# Patient Record
Sex: Male | Born: 1965
Health system: Southern US, Community
[De-identification: ages and names within clinical notes are randomized; demographics above are authoritative.]

## PROBLEM LIST (undated history)

## (undated) DIAGNOSIS — F03918 Unspecified dementia, unspecified severity, with other behavioral disturbance: Secondary | ICD-10-CM

## (undated) DIAGNOSIS — R569 Unspecified convulsions: Secondary | ICD-10-CM

## (undated) DIAGNOSIS — S3992XA Unspecified injury of lower back, initial encounter: Secondary | ICD-10-CM

## (undated) DIAGNOSIS — F32A Depression, unspecified: Secondary | ICD-10-CM

## (undated) DIAGNOSIS — Z5189 Encounter for other specified aftercare: Secondary | ICD-10-CM

## (undated) DIAGNOSIS — F0391 Unspecified dementia with behavioral disturbance: Secondary | ICD-10-CM

## (undated) DIAGNOSIS — R519 Headache, unspecified: Secondary | ICD-10-CM

## (undated) DIAGNOSIS — D500 Iron deficiency anemia secondary to blood loss (chronic): Secondary | ICD-10-CM

## (undated) DIAGNOSIS — S069XAA Unspecified intracranial injury with loss of consciousness status unknown, initial encounter: Secondary | ICD-10-CM

## (undated) DIAGNOSIS — E785 Hyperlipidemia, unspecified: Secondary | ICD-10-CM

## (undated) DIAGNOSIS — S069X9A Unspecified intracranial injury with loss of consciousness of unspecified duration, initial encounter: Secondary | ICD-10-CM

## (undated) DIAGNOSIS — T783XXA Angioneurotic edema, initial encounter: Secondary | ICD-10-CM

## (undated) DIAGNOSIS — R339 Retention of urine, unspecified: Secondary | ICD-10-CM

## (undated) DIAGNOSIS — K264 Chronic or unspecified duodenal ulcer with hemorrhage: Secondary | ICD-10-CM

## (undated) DIAGNOSIS — K922 Gastrointestinal hemorrhage, unspecified: Secondary | ICD-10-CM

## (undated) HISTORY — DX: Iron deficiency anemia secondary to blood loss (chronic): D50.0

## (undated) HISTORY — DX: Angioneurotic edema, initial encounter: T78.3XXA

## (undated) HISTORY — DX: Hyperlipidemia, unspecified: E78.5

## (undated) HISTORY — DX: Depression, unspecified: F32.A

## (undated) HISTORY — DX: Encounter for other specified aftercare: Z51.89

## (undated) HISTORY — DX: Retention of urine, unspecified: R33.9

## (undated) HISTORY — PX: CRANIOTOMY: SHX93

## (undated) HISTORY — PX: CSF SHUNT: SHX92

## (undated) HISTORY — PX: BRAIN SURGERY: SHX531

---

## 2012-07-23 HISTORY — PX: OTHER SURGICAL HISTORY: SHX169

## 2012-08-23 ENCOUNTER — Encounter (HOSPITAL_COMMUNITY): Payer: Self-pay | Admitting: *Deleted

## 2012-08-23 ENCOUNTER — Emergency Department (HOSPITAL_COMMUNITY)
Admission: EM | Admit: 2012-08-23 | Discharge: 2012-08-23 | Disposition: A | Discharge status: Home / Self Care | Payer: BC Managed Care – PPO | Attending: Emergency Medicine | Admitting: Emergency Medicine

## 2012-08-23 DIAGNOSIS — R451 Restlessness and agitation: Secondary | ICD-10-CM

## 2012-08-23 DIAGNOSIS — Z87820 Personal history of traumatic brain injury: Secondary | ICD-10-CM

## 2012-08-23 DIAGNOSIS — IMO0002 Reserved for concepts with insufficient information to code with codable children: Secondary | ICD-10-CM | POA: Insufficient documentation

## 2012-08-23 DIAGNOSIS — Z87891 Personal history of nicotine dependence: Secondary | ICD-10-CM | POA: Insufficient documentation

## 2012-08-23 HISTORY — DX: Unspecified intracranial injury with loss of consciousness of unspecified duration, initial encounter: S06.9X9A

## 2012-08-23 HISTORY — DX: Unspecified intracranial injury with loss of consciousness status unknown, initial encounter: S06.9XAA

## 2012-08-23 HISTORY — DX: Unspecified injury of lower back, initial encounter: S39.92XA

## 2012-08-23 LAB — URINALYSIS, ROUTINE W REFLEX MICROSCOPIC
Leukocytes, UA: NEGATIVE
Nitrite: NEGATIVE
Specific Gravity, Urine: 1.019 (ref 1.005–1.030)
Urobilinogen, UA: 0.2 mg/dL (ref 0.0–1.0)

## 2012-08-23 LAB — CBC WITH DIFFERENTIAL/PLATELET
Basophils Absolute: 0 10*3/uL (ref 0.0–0.1)
Basophils Relative: 0 % (ref 0–1)
Eosinophils Absolute: 0.3 10*3/uL (ref 0.0–0.7)
Lymphs Abs: 3 10*3/uL (ref 0.7–4.0)
MCH: 31.2 pg (ref 26.0–34.0)
Neutrophils Relative %: 49 % (ref 43–77)
Platelets: 202 10*3/uL (ref 150–400)
RBC: 4.14 MIL/uL — ABNORMAL LOW (ref 4.22–5.81)
RDW: 12.5 % (ref 11.5–15.5)

## 2012-08-23 LAB — ETHANOL: Alcohol, Ethyl (B): <11 mg/dL (ref 0–11)

## 2012-08-23 LAB — BASIC METABOLIC PANEL
GFR calc Af Amer: >90 mL/min (ref >90)
GFR calc non Af Amer: >90 mL/min (ref >90)
Glucose, Bld: 100 mg/dL — ABNORMAL HIGH (ref 70–99)
Potassium: 4.2 mEq/L (ref 3.5–5.1)
Sodium: 138 mEq/L (ref 135–145)

## 2012-08-23 LAB — CARBAMAZEPINE LEVEL, TOTAL: Carbamazepine Lvl: 8.4 ug/mL (ref 4.0–12.0)

## 2012-08-23 MED ORDER — LORAZEPAM 1 MG PO TABS: 1.0000 mg | ORAL_TABLET | Freq: Once | ORAL | Status: DC

## 2012-08-23 NOTE — ED Notes (Signed)
[name] wife and mother state they were able to give him Haldol 5mg  this morning around 730 or 8am in order to get him calm enough to transport to [facility].  Wife states that since November his mood and behavior has followed a pattern: several days "overly cooperative" then several days "very needy" then several days of "verbally aggressive" and "fearful" behavior, including paranoia ("talks about people wanting to kill him," sometimes uses foul language, and has a fear of his location/being trapped).

## 2012-08-23 NOTE — ED Notes (Signed)
Family reports pt has hx of TBI. Just moved to [place] from [place] pt has not been able to be set up with psychiatrist yet. Pt does have PCP. Family reports behavior issues for last 3 days ei agitation. Pt has been redirectable. Family would like help with possible medication changes.

## 2012-08-23 NOTE — ED Provider Notes (Signed)
History     CSN: [id number]  Arrival date & time 08/23/12  1003   First MD Initiated Contact with Patient 08/23/12 1125      Chief Complaint  Patient presents with  . Agitation    (Consider location/radiation/quality/duration/timing/severity/associated sxs/prior treatment) HPI Hx from family. Zeth Buday is a 46 y.o. male who is status post TBI approximately 18 months ago. He and his family just moved to [place] from [place]. Patient is currently on several medications to control behavioral issues resulting from his injury, including Tegretol, Zyprexa, propranolol, and prn Haldol. He was followed by a psychiatrist and neuropsychologist while living in [place]; wife reports that he was hospitalized primarily to work on his medication regimen for about 2 months just prior to moving. They have set up an appointment with a local psychiatrist but are unsure if this individual specializes in TBI patients.   Pt presents today as he has been increasingly agitated over the past 3 days. Family reports that he has had outbursts with cursing and yelling and has been paranoid that family members are going to hurt him. He does not become physically violent. They have a 46 year old at home who becomes understandably frightened with these outbursts. Pt is usually redirectable and is typically compliant with taking his medications although sometimes they have to struggle to get him to take his Haldol if he is becoming agitated. Pt was last given Haldol this morning around 8 am. Pt denies any suicidal or homicidal ideation to me. He is fairly high functioning since his accident; he is ambulatory and can perform most ADLs by himself although does need some assistance with showering, etc.  Past Medical History  Diagnosis Date  . TBI (traumatic brain injury)   . Back injury     Past Surgical History  Procedure Date  . Csf shunt   . Craniotomy     Post TBI craniotomy and plate insertion    History  reviewed. No pertinent family history.  History  Substance Use Topics  . Smoking status: Former Games developer  . Smokeless tobacco: Not on file  . Alcohol Use: No      Review of Systems  Constitutional: Negative for fever and chills.  HENT: Negative for congestion and sore throat.   Respiratory: Negative for cough and shortness of breath.   Cardiovascular: Negative for chest pain and palpitations.  Gastrointestinal: Negative for nausea, vomiting and abdominal pain.  Genitourinary: Negative for decreased urine volume.  Skin: Negative for rash.  Neurological: Negative for weakness.  Psychiatric/Behavioral: Positive for agitation.    Allergies  Review of patient's allergies indicates no known allergies.  Home Medications   Current Outpatient Rx  Name Route Sig Dispense Refill  . CARBAMAZEPINE ER 400 MG PO TB12 Oral Take 400 mg by mouth 2 (two) times daily.    [name] HALOPERIDOL 5 MG PO TABS Oral Take 5 mg by mouth every 8 (eight) hours as needed. For agitation    . ADULT MULTIVITAMIN W/MINERALS CH Oral Take 1 tablet by mouth daily.    [name] OLANZAPINE 5 MG PO TABS Oral Take 5 mg by mouth 2 (two) times daily.    [name] PROPRANOLOL HCL 20 MG PO TABS Oral Take 60 mg by mouth 2 (two) times daily.      BP 100/76  Pulse 78  Temp 98 F (36.7 C) (Oral)  Resp 16  SpO2 98%  Physical Exam  Nursing note and vitals reviewed. Constitutional: He is oriented to person, place,  and time. He appears well-developed and well-nourished. No distress.  HENT:  Head: Normocephalic and atraumatic.  Mouth/Throat: Oropharynx is clear and moist. No oropharyngeal exudate.  Eyes: EOM are normal. Pupils are equal, round, and reactive to light.  Neck: Normal range of motion.  Cardiovascular: Normal rate, regular rhythm and normal heart sounds.   Pulmonary/Chest: Effort normal and breath sounds normal. He exhibits no tenderness.  Abdominal: Soft. Bowel sounds are normal. There is no tenderness. There is no rebound and no  guarding.  Musculoskeletal: Normal range of motion.  Neurological: He is alert and oriented to person, place, and time. No cranial nerve deficit. He exhibits normal muscle tone. Coordination normal.  Skin: Skin is warm and dry. He is not diaphoretic.  Psychiatric:       Pt generally cooperative with exam. Not overtly agitated. Does answer most questions. Occasionally interrupts but is easily redirectable.    ED Course  Procedures (including critical care time)  Labs Reviewed  CBC WITH DIFFERENTIAL - Abnormal; Notable for the following:    RBC 4.14 (*)     Hemoglobin 12.9 (*)     HCT 38.9 (*)     All other components within normal limits  BASIC METABOLIC PANEL - Abnormal; Notable for the following:    Glucose, Bld 100 (*)     All other components within normal limits  URINALYSIS, ROUTINE W REFLEX MICROSCOPIC  ETHANOL  CARBAMAZEPINE LEVEL, TOTAL  URINE RAPID DRUG SCREEN (HOSP PERFORMED)   No results found.   1. History of traumatic brain injury   2. Agitation       MDM  Pt with hx TBI, who is new to the area, presents for increased agitation. No evidence on exam or per labs for obvious organic cause of pt's sx. He is calm and cooperative and easily redirectable on my exam; do not feel that he represents a true threat to himself or others at this time. Offered to perform telepsychiatry consult for possible medication adjustment but family declined. Family was given local resources as well as a referral to a neuropsychiatrist in [place] who specializes in TBI patients. They are aware that they can return to the ED at any time should his agitation worsen or with new symptoms.  Grant Fontana, PA-C 08/24/12 1106

## 2012-08-24 NOTE — ED Provider Notes (Signed)
Medical screening examination/treatment/procedure(s) were performed by non-physician practitioner and as supervising physician I was immediately available for consultation/collaboration.  Nikki Glanzer R. Britania Shreeve, MD 08/24/12 1600 

## 2012-08-28 ENCOUNTER — Encounter: Payer: Self-pay | Admitting: Physical Medicine & Rehabilitation

## 2012-09-07 ENCOUNTER — Encounter
Payer: BC Managed Care – PPO | Attending: Physical Medicine & Rehabilitation | Admitting: Physical Medicine & Rehabilitation

## 2012-09-07 ENCOUNTER — Encounter: Payer: Self-pay | Admitting: Physical Medicine & Rehabilitation

## 2012-09-07 VITALS — BP 107/74 | HR 56 | Resp 12 | Ht 72.0 in | Wt 151.6 lb

## 2012-09-07 DIAGNOSIS — S020XXA Fracture of vault of skull, initial encounter for closed fracture: Secondary | ICD-10-CM

## 2012-09-07 DIAGNOSIS — F068 Other specified mental disorders due to known physiological condition: Secondary | ICD-10-CM

## 2012-09-07 DIAGNOSIS — S069X9S Unspecified intracranial injury with loss of consciousness of unspecified duration, sequela: Secondary | ICD-10-CM

## 2012-09-07 DIAGNOSIS — G3184 Mild cognitive impairment, so stated: Secondary | ICD-10-CM | POA: Insufficient documentation

## 2012-09-07 DIAGNOSIS — X58XXXA Exposure to other specified factors, initial encounter: Secondary | ICD-10-CM | POA: Insufficient documentation

## 2012-09-07 DIAGNOSIS — S069X0S Unspecified intracranial injury without loss of consciousness, sequela: Secondary | ICD-10-CM

## 2012-09-07 DIAGNOSIS — S069XAA Unspecified intracranial injury with loss of consciousness status unknown, initial encounter: Secondary | ICD-10-CM | POA: Insufficient documentation

## 2012-09-07 DIAGNOSIS — F3289 Other specified depressive episodes: Secondary | ICD-10-CM | POA: Insufficient documentation

## 2012-09-07 DIAGNOSIS — R269 Unspecified abnormalities of gait and mobility: Secondary | ICD-10-CM | POA: Insufficient documentation

## 2012-09-07 DIAGNOSIS — F09 Unspecified mental disorder due to known physiological condition: Secondary | ICD-10-CM

## 2012-09-07 DIAGNOSIS — S069X9A Unspecified intracranial injury with loss of consciousness of unspecified duration, initial encounter: Secondary | ICD-10-CM

## 2012-09-07 DIAGNOSIS — F329 Major depressive disorder, single episode, unspecified: Secondary | ICD-10-CM | POA: Insufficient documentation

## 2012-09-07 DIAGNOSIS — S069XAS Unspecified intracranial injury with loss of consciousness status unknown, sequela: Secondary | ICD-10-CM | POA: Insufficient documentation

## 2012-09-07 DIAGNOSIS — S0280XA Fracture of other specified skull and facial bones, unspecified side, initial encounter for closed fracture: Secondary | ICD-10-CM | POA: Insufficient documentation

## 2012-09-07 DIAGNOSIS — F6381 Intermittent explosive disorder: Secondary | ICD-10-CM

## 2012-09-07 LAB — CBC
MCH: 31.7 pg (ref 26.0–34.0)
MCHC: 34.7 g/dL (ref 30.0–36.0)
MCV: 91.4 fL (ref 78.0–100.0)
Platelets: 250 10*3/uL (ref 150–400)

## 2012-09-07 NOTE — Patient Instructions (Addendum)
TRY TO DECREASE ZYPREXA TO 2.5MG  NIGHTLY FOR NOW.  ONCE ZYPREXA IS DECREASED. TRY TO ADD THE SECOND DOSE OF AMANTADINE IN AT 12-1PM DAILY

## 2012-09-07 NOTE — Progress Notes (Signed)
Subjective:    Patient ID: Zachary Thornton, male    DOB: 06/10/1966, 46 y.o.   MRN: [id number]  HPI  [name] is here for an initial visit via [name] regarding a severe TBI suffered in March of 2012. Apparently he fell backward down a flight of stairs nad suffered a Right SDH, Right EDH, bifrontal contusions, left temporal SDH, L-para falciform SDH, bilateral skull fx' including the bilateral temporal, skull base, etc. He required a craniectomy on 03/20/11 for decompression and ultimately requried  multiple flap replacements due to infection, etc. He required a IVP shunt as well as a PEG. He was in the hospital for a prolonged stay. He transitioned to a subacute setting and ultimately to an inpatient rehab program over the course of months. He has been involved with home health therapies as well as outpt therapies most recently. His wife states he hasn't been a big fan of going to outpt rehab because he had to transport there in a bus/van.   They moved to [place] about a month ago so that his mother could help with his care. The transition back has not been too bad thus far. Behaviorally he has been a bit uneven, but this has levelled out over the last 2 weeks or so. Rhylen has had problems with agitation and aggressive behavior but this seems to be levelling out also. He was switched to acombination of drugs which included zyprexa, tegretol, and inderal a few months back. His wife has been trying to wean his zyprexa as she thinks it sedates him. He also is amantadine for arousal and attention. His wife thinks he has been depressed at times, but it hasn't been consistent. She feels that some of this depressed mood may be related to relative isolation during the day time while she's at work.  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 He has a 34 year old son.  From a mobility standpoint, [name] has been doing well. He walks without a device. His balance has been fair to good. He is sleeping about 13 hours per night.  Interestingly, his wife states that he is at his peak cognitive,attentional form in the late afternoon, and tends to start out quite slowly each day. He is able to dress himself and perform simple hygiene tasks. He does require ongoing supervision due to safety. Perseveration is problem frequently, buty usually family is able to talk him out of it. For the most part his agitation has decreased quite a bit. Short and long term memory remain major problems. His appetite has be excellent---in fact his wife states he's always hungry. Jourdan denies pain     Pain Inventory Average Pain 0 Pain Right Now 0 My pain is no pain  In the last 24 hours, has pain interfered with the following? General activity 0 Relation with others 0 Enjoyment of life 0 What TIME of day is your pain at its worst? no pain Sleep (in general) Good  Pain is worse with: no pain Pain improves with: no pain Relief from Meds: no pain  Mobility walk with assistance how many minutes can you walk? 10 ability to climb steps?  yes do you drive?  no  Function disabled: date disabled 08/16/2011 I need assistance with the following:  feeding, dressing, bathing, meal prep, household duties and shopping  Neuro/Psych weakness confusion depression  Prior Studies Any changes since last visit?  no  Physicians involved in your care Any changes since last visit?  no Primary care [employer]  History reviewed. No pertinent family history. History   Social History  . Marital Status: Married    Spouse Name: N/A    Number of Children: N/A  . Years of Education: N/A   Social History Main Topics  . Smoking status: Former Games developer  . Smokeless tobacco: Never Used  . Alcohol Use: No  . Drug Use: No  . Sexually Active: None   Other Topics Concern  . None   Social History Narrative  . None   Past Surgical History  Procedure Date  . Csf shunt   . Craniotomy     Post TBI craniotomy and plate insertion   Past  Medical History  Diagnosis Date  . TBI (traumatic brain injury)   . Back injury    BP 107/74  Pulse 56  Resp 12  Ht 6' (1.829 m)  Wt 151 lb 9.6 oz (68.765 kg)  BMI 20.56 kg/m2  SpO2 97%   Review of Systems  Musculoskeletal: Positive for gait problem.  Neurological: Positive for weakness.  Psychiatric/Behavioral: Positive for confusion. The patient is nervous/anxious.   All other systems reviewed and are negative.       Objective:   Physical Exam  General: Alert and oriented x 3, No apparent distress HEENT: Head is normocephalic, atraumatic, PERRLA, EOMI, sclera anicteric, oral mucosa pink and moist, dentition intact, ext ear canals clear,  Neck: Supple without JVD or lymphadenopathy Heart: Reg rate and rhythm. No murmurs rubs or gallops Chest: CTA bilaterally without wheezes, rales, or rhonchi; no distress Abdomen: Soft, non-tender, non-distended, bowel sounds positive. Extremities: No clubbing, cyanosis, or edema. Pulses are 2+ Skin: Clean and intact without signs of breakdown Neuro: Pt knows his name. Needs cues for month, year, city, reason, place. He is able to spell the word "world" forward but misses the last 2 letters when going backward. He could sequence simple numbers in series of 2, but nothing any higher. He remembered 0/3 words after 5 minutes. His abstract thinking was somewhat concrete, but i had the feeling he was trying to relay me the abstract meaning of question. His attention was fair but would wane when distracted. He did perseverate at times on certain phrases or topics but was not agitated. Visual fields were notable for mild peripheral loss around the right edges of his visual field, moreso at the inferior aspect. His voice was nasal but intelligbile. The remainder of CN was nremarkable. Strength was 5/5 on all 4 but he had diminished FMC of the right side with some apraxia noted. Sensation appeared to be grossly intact. He had repetitive, restless moves of  the head and shoulders at times. He ambulated with good balance, however he used a straight-legged pattern and his heel strike was impaired.  He did display circumduction of the leg and a flat foot strike for me.  Musculoskeletal: Full ROM, No pain with AROM or PROM in the neck, trunk, or extremities. Posture appropriate Psych: [name] affect is flat but generally quite pleasant. Pt is cooperative         Assessment & Plan:  Assessment: 1. Severe TBI with depressed skull fxs. Injuries most prominent in the fronto-temporal areas bilaterally. 2. Cognitive-behavioral deficits due to the above 3. Gait disorder due the above 4. Mild depression   Plan: 1. I spent over an hour with the patient and his family reviewing potential treatment options. Obviously, they have undergone many to this point.  2. I think at this time I will make referrals for PT,  OT, SLP, and Neuropsych to reassess his cognitive bheavior needs, higher level balance, safety, etc. 3. We drew multiple tests today including, CBC CMET, tegretol level, hormone levels, etc to see if any changes need to be made to his medication regimen to maximize his neuro-cognitive outcome. 4. Will wean zyprexa to 2.5 nightly with the goal of coming off this entirely. The fact that he is more awake at the end of the day as ooposed to beginning, is a sign that we might be seeing a medication effect. 5. Increase amantadine to 100mg  bid to improve daytime attention.  6. Discussed over the counter supplements to maximize cognitive function including aricept, exelon, namenda which we could explore over the coming months.  7. All questions were encouraged and answered. His wife will call me as needed.

## 2012-09-08 LAB — COMPREHENSIVE METABOLIC PANEL
ALT: 14 U/L (ref 0–53)
Alkaline Phosphatase: 99 U/L (ref 39–117)
Creat: 1.01 mg/dL (ref 0.50–1.35)
Sodium: 145 mEq/L (ref 135–145)
Total Bilirubin: 0.4 mg/dL (ref 0.3–1.2)
Total Protein: 7.4 g/dL (ref 6.0–8.3)

## 2012-09-08 LAB — TSH: TSH: 1.629 u[IU]/mL (ref 0.350–4.500)

## 2012-09-09 ENCOUNTER — Telehealth: Payer: Self-pay | Admitting: Physical Medicine & Rehabilitation

## 2012-09-09 NOTE — Telephone Encounter (Signed)
Let family know that all of his labs look ok. thanks

## 2012-09-10 NOTE — Telephone Encounter (Signed)
Left voicemail on personally identified voicemail notifying that Zachary Thornton's labs were normal and to call if any questions.

## 2012-09-16 ENCOUNTER — Other Ambulatory Visit: Payer: Self-pay | Admitting: Neurology

## 2012-09-16 DIAGNOSIS — Z87820 Personal history of traumatic brain injury: Secondary | ICD-10-CM

## 2012-09-16 DIAGNOSIS — R269 Unspecified abnormalities of gait and mobility: Secondary | ICD-10-CM

## 2012-09-16 DIAGNOSIS — R413 Other amnesia: Secondary | ICD-10-CM

## 2012-09-17 ENCOUNTER — Encounter: Payer: Self-pay | Admitting: Physical Medicine & Rehabilitation

## 2012-09-22 ENCOUNTER — Other Ambulatory Visit: Payer: BC Managed Care – PPO

## 2012-09-24 ENCOUNTER — Ambulatory Visit: Payer: BC Managed Care – PPO | Attending: Physical Medicine & Rehabilitation | Admitting: Physical Therapy

## 2012-09-24 ENCOUNTER — Ambulatory Visit
Admission: RE | Admit: 2012-09-24 | Discharge: 2012-09-24 | Disposition: A | Discharge status: Home / Self Care | Payer: BC Managed Care – PPO | Source: Ambulatory Visit | Attending: Neurology | Admitting: Neurology

## 2012-09-24 ENCOUNTER — Other Ambulatory Visit: Payer: BC Managed Care – PPO

## 2012-09-24 DIAGNOSIS — R269 Unspecified abnormalities of gait and mobility: Secondary | ICD-10-CM

## 2012-09-24 DIAGNOSIS — R279 Unspecified lack of coordination: Secondary | ICD-10-CM | POA: Insufficient documentation

## 2012-09-24 DIAGNOSIS — M6281 Muscle weakness (generalized): Secondary | ICD-10-CM | POA: Insufficient documentation

## 2012-09-24 DIAGNOSIS — R4189 Other symptoms and signs involving cognitive functions and awareness: Secondary | ICD-10-CM | POA: Insufficient documentation

## 2012-09-24 DIAGNOSIS — Z5189 Encounter for other specified aftercare: Secondary | ICD-10-CM | POA: Insufficient documentation

## 2012-09-24 DIAGNOSIS — R41840 Attention and concentration deficit: Secondary | ICD-10-CM | POA: Insufficient documentation

## 2012-09-24 DIAGNOSIS — R41841 Cognitive communication deficit: Secondary | ICD-10-CM | POA: Insufficient documentation

## 2012-09-24 DIAGNOSIS — Z87820 Personal history of traumatic brain injury: Secondary | ICD-10-CM

## 2012-09-24 DIAGNOSIS — R413 Other amnesia: Secondary | ICD-10-CM

## 2012-09-30 ENCOUNTER — Ambulatory Visit: Payer: BC Managed Care – PPO | Admitting: Speech Pathology

## 2012-10-01 ENCOUNTER — Ambulatory Visit: Payer: BC Managed Care – PPO | Admitting: Occupational Therapy

## 2012-10-01 ENCOUNTER — Ambulatory Visit: Payer: BC Managed Care – PPO | Admitting: Physical Therapy

## 2012-10-05 ENCOUNTER — Ambulatory Visit: Payer: BC Managed Care – PPO | Admitting: Physical Therapy

## 2012-10-06 ENCOUNTER — Ambulatory Visit: Payer: BC Managed Care – PPO | Admitting: Occupational Therapy

## 2012-10-07 ENCOUNTER — Encounter: Payer: Self-pay | Admitting: Physical Medicine & Rehabilitation

## 2012-10-07 ENCOUNTER — Encounter
Payer: BC Managed Care – PPO | Attending: Physical Medicine & Rehabilitation | Admitting: Physical Medicine & Rehabilitation

## 2012-10-07 VITALS — BP 111/84 | HR 80 | Ht 72.0 in | Wt 151.2 lb

## 2012-10-07 DIAGNOSIS — F068 Other specified mental disorders due to known physiological condition: Secondary | ICD-10-CM

## 2012-10-07 DIAGNOSIS — S069X9S Unspecified intracranial injury with loss of consciousness of unspecified duration, sequela: Secondary | ICD-10-CM

## 2012-10-07 DIAGNOSIS — S020XXA Fracture of vault of skull, initial encounter for closed fracture: Secondary | ICD-10-CM

## 2012-10-07 DIAGNOSIS — S069XAA Unspecified intracranial injury with loss of consciousness status unknown, initial encounter: Secondary | ICD-10-CM

## 2012-10-07 DIAGNOSIS — R4189 Other symptoms and signs involving cognitive functions and awareness: Secondary | ICD-10-CM

## 2012-10-07 DIAGNOSIS — S069XAS Unspecified intracranial injury with loss of consciousness status unknown, sequela: Secondary | ICD-10-CM

## 2012-10-07 DIAGNOSIS — X58XXXA Exposure to other specified factors, initial encounter: Secondary | ICD-10-CM | POA: Insufficient documentation

## 2012-10-07 DIAGNOSIS — F6381 Intermittent explosive disorder: Secondary | ICD-10-CM

## 2012-10-07 DIAGNOSIS — F09 Unspecified mental disorder due to known physiological condition: Secondary | ICD-10-CM | POA: Insufficient documentation

## 2012-10-07 MED ORDER — CARBAMAZEPINE ER 400 MG PO TB12: 400.0000 mg | ORAL_TABLET | Freq: Two times a day (BID) | ORAL | Status: DC

## 2012-10-07 NOTE — Progress Notes (Signed)
Subjective:    Patient ID: Zachary Thornton, male    DOB: June 18, 1966, 46 y.o.   MRN: [id number]  HPI  Zachary Thornton is back regarding his severe TBI. At last visit I made referrals to outpt therapies where evaluations are underway. He had a headache the other day when he woke up. He had been participating in therapies the prior day.  He denies any dizziness or nausea.  At home his behavior has been good. Wife weaned his zyprexa down to 2.5mg  and increased the amantadine to 100mg  bid. She noticed that the extra dose really made him more anxious and sometimes agitated so she backed down to the QD dose which worked. She has been afraid to further decrease the zyprexa.  Emotionally, Zachary Thornton has been quite attached to his wife and mother, but for the most part his mood has been upbeat.   Pain Inventory Average Pain 0 Pain Right Now 0 My pain is no pain  In the last 24 hours, has pain interfered with the following? General activity 0 Relation with others 0 Enjoyment of life 0 What TIME of day is your pain at its worst? no pain Sleep (in general) Good  Pain is worse with: no pain Pain improves with: no pain Relief from Meds: no pain  Mobility walk without assistance ability to climb steps?  yes do you drive?  no  Function disabled: date disabled 2012 I need assistance with the following:  dressing, bathing, toileting, meal prep, household duties and shopping  Neuro/Psych trouble walking confusion depression anxiety  Prior Studies Any changes since last visit?  no  Physicians involved in your care Any changes since last visit?  no   History reviewed. No pertinent family history. History   Social History  . Marital Status: Married    Spouse Name: N/A    Number of Children: N/A  . Years of Education: N/A   Social History Main Topics  . Smoking status: Former Games developer  . Smokeless tobacco: Never Used  . Alcohol Use: No  . Drug Use: No  . Sexually Active: None   Other Topics  Concern  . None   Social History Narrative  . None   Past Surgical History  Procedure Date  . Csf shunt   . Craniotomy     Post TBI craniotomy and plate insertion   Past Medical History  Diagnosis Date  . TBI (traumatic brain injury)   . Back injury    BP 111/84  Pulse 80  Ht 6' (1.829 m)  Wt 151 lb 3.2 oz (68.584 kg)  BMI 20.51 kg/m2  SpO2 98%   Review of Systems  Musculoskeletal: Positive for gait problem.  Psychiatric/Behavioral: Positive for confusion and dysphoric mood. The patient is nervous/anxious.   All other systems reviewed and are negative.       Objective:   Physical Exam  General: Alert and oriented x 3, No apparent distress  HEENT: Head is normocephalic, atraumatic, PERRLA, EOMI, sclera anicteric, oral mucosa pink and moist, dentition intact, ext ear canals clear,  Neck: Supple without JVD or lymphadenopathy  Heart: Reg rate and rhythm. No murmurs rubs or gallops  Chest: CTA bilaterally without wheezes, rales, or rhonchi; no distress  Abdomen: Soft, non-tender, non-distended, bowel sounds positive.  Extremities: No clubbing, cyanosis, or edema. Pulses are 2+  Skin: Clean and intact without signs of breakdown  Neuro: Pt knows his name. Needs cues for month, year, city, reason, place.   He remembered 0/3 words after 5 minutes.  His abstract thinking was somewhat concrete, but i had the feeling he was trying to relay me the abstract meaning of question. His attention was fair but would wane when distracted. He persistently replies in an automatic way with "i'm sorry" no matter how inappropriate it may be. Visual fields were notable for mild peripheral loss around the right edges of his visual field, moreso at the inferior aspect. His voice was nasal but intelligbile. The remainder of CN was nremarkable. Strength was 5/5 on all 4 but he had diminished FMC of the right side with some apraxia noted. Sensation appeared to be grossly intact.  he ambulated with good  balance, however he used a straight-legged pattern and his heel strike was impaired. He did display circumduction of the leg and a flat foot strike for me.  Musculoskeletal: Full ROM, No pain with AROM or PROM in the neck, trunk, or extremities. Posture appropriate  Psych: [name] affect is flat but generally quite pleasant. Pt is cooperative   Assessment & Plan:   Assessment:  1. Severe TBI with depressed skull fxs. Injuries most prominent in the fronto-temporal areas bilaterally.  2. Cognitive-behavioral deficits due to the above  3. Gait disorder due the above  4. Mild depression   Plan:  1. I spent 30 minutes plus with the patient and his family today.  .  2. Continue with outpt therapies as outlined. i will speak with the disciplines as appropriate.  3.refilled tegretol today at prior dosing.  4. Will wean zyprexa to 2.5 nightly with the goal of coming off this entirely. Will substitute trazodone 100mg  qhs for sleep as a transition. Will wean to of over the next few weeks as tolerated. Can use trazodone as needed for agitation during the day as well 5. Maintain amantadine at 100mg  daily as the bid schedule increased his agitation. 6. Discussed over the counter supplements to maximize cognitive function. Also will consider aricept, exelon, namenda in the future as well. 7. Maintain propanolol at current dosing for the time being until his zyprexa is successfully weaned off and he's at a steady state  7. All questions were encouraged and answered. His wife will call me as needed.

## 2012-10-07 NOTE — Patient Instructions (Signed)
WEEK 1: STOP ZYPREXA TRAZODONE 100MG  AT BEDTIME  WEEK 2: TRAZODONE 50MG  AT BEDTIME  WEEK 3 PLUS: TRAZODONE AS NEEDED AT BEDTIME   YOU MAY USE THE TRAZODONE 50-100MG  EVERY 6 HOURS AS NEEDED FOR AGITATION

## 2012-10-08 ENCOUNTER — Ambulatory Visit: Payer: BC Managed Care – PPO | Admitting: Physical Therapy

## 2012-10-08 ENCOUNTER — Ambulatory Visit: Payer: BC Managed Care – PPO | Admitting: Occupational Therapy

## 2012-10-08 DIAGNOSIS — F0280 Dementia in other diseases classified elsewhere without behavioral disturbance: Secondary | ICD-10-CM

## 2012-10-08 DIAGNOSIS — S069X9A Unspecified intracranial injury with loss of consciousness of unspecified duration, initial encounter: Secondary | ICD-10-CM

## 2012-10-08 DIAGNOSIS — X58XXXA Exposure to other specified factors, initial encounter: Secondary | ICD-10-CM

## 2012-10-08 DIAGNOSIS — S069XAA Unspecified intracranial injury with loss of consciousness status unknown, initial encounter: Secondary | ICD-10-CM

## 2012-10-13 ENCOUNTER — Ambulatory Visit: Payer: BC Managed Care – PPO | Admitting: Physical Therapy

## 2012-10-13 ENCOUNTER — Ambulatory Visit: Payer: BC Managed Care – PPO | Admitting: Occupational Therapy

## 2012-10-14 ENCOUNTER — Ambulatory Visit: Payer: BC Managed Care – PPO | Admitting: Physical Therapy

## 2012-10-14 ENCOUNTER — Ambulatory Visit: Payer: BC Managed Care – PPO | Admitting: Occupational Therapy

## 2012-10-21 ENCOUNTER — Ambulatory Visit: Payer: BC Managed Care – PPO | Admitting: Occupational Therapy

## 2012-10-21 ENCOUNTER — Ambulatory Visit: Payer: BC Managed Care – PPO | Admitting: Rehabilitative and Restorative Service Providers"

## 2012-10-21 ENCOUNTER — Ambulatory Visit: Payer: BC Managed Care – PPO

## 2012-10-23 ENCOUNTER — Ambulatory Visit: Payer: BC Managed Care – PPO

## 2012-10-23 ENCOUNTER — Ambulatory Visit: Payer: BC Managed Care – PPO | Admitting: Occupational Therapy

## 2012-10-23 ENCOUNTER — Ambulatory Visit: Payer: BC Managed Care – PPO | Attending: Physical Medicine & Rehabilitation | Admitting: Physical Therapy

## 2012-10-23 DIAGNOSIS — R4189 Other symptoms and signs involving cognitive functions and awareness: Secondary | ICD-10-CM | POA: Insufficient documentation

## 2012-10-23 DIAGNOSIS — Z5189 Encounter for other specified aftercare: Secondary | ICD-10-CM | POA: Insufficient documentation

## 2012-10-23 DIAGNOSIS — M6281 Muscle weakness (generalized): Secondary | ICD-10-CM | POA: Insufficient documentation

## 2012-10-23 DIAGNOSIS — R41840 Attention and concentration deficit: Secondary | ICD-10-CM | POA: Insufficient documentation

## 2012-10-23 DIAGNOSIS — R279 Unspecified lack of coordination: Secondary | ICD-10-CM | POA: Insufficient documentation

## 2012-10-23 DIAGNOSIS — R269 Unspecified abnormalities of gait and mobility: Secondary | ICD-10-CM | POA: Insufficient documentation

## 2012-10-23 DIAGNOSIS — R41841 Cognitive communication deficit: Secondary | ICD-10-CM | POA: Insufficient documentation

## 2012-10-28 ENCOUNTER — Ambulatory Visit: Payer: BC Managed Care – PPO | Admitting: Occupational Therapy

## 2012-10-28 ENCOUNTER — Ambulatory Visit: Payer: BC Managed Care – PPO | Admitting: Physical Therapy

## 2012-10-28 ENCOUNTER — Ambulatory Visit: Payer: BC Managed Care – PPO | Admitting: Speech Pathology

## 2012-10-30 ENCOUNTER — Ambulatory Visit: Payer: BC Managed Care – PPO

## 2012-10-30 ENCOUNTER — Ambulatory Visit: Payer: BC Managed Care – PPO | Admitting: Physical Therapy

## 2012-10-30 ENCOUNTER — Ambulatory Visit: Payer: BC Managed Care – PPO | Admitting: Occupational Therapy

## 2012-11-02 ENCOUNTER — Ambulatory Visit: Payer: BC Managed Care – PPO | Admitting: Physical Therapy

## 2012-11-02 ENCOUNTER — Ambulatory Visit: Payer: BC Managed Care – PPO | Admitting: Occupational Therapy

## 2012-11-02 ENCOUNTER — Ambulatory Visit: Payer: BC Managed Care – PPO | Admitting: Speech Pathology

## 2012-11-05 ENCOUNTER — Ambulatory Visit: Payer: BC Managed Care – PPO | Admitting: Occupational Therapy

## 2012-11-05 ENCOUNTER — Ambulatory Visit: Payer: BC Managed Care – PPO | Admitting: Physical Therapy

## 2012-11-05 ENCOUNTER — Ambulatory Visit: Payer: BC Managed Care – PPO

## 2012-11-05 ENCOUNTER — Telehealth: Payer: Self-pay

## 2012-11-05 NOTE — Telephone Encounter (Signed)
Christine at [facility] called to get permission for patients tegretol brand to be switch.  Lm advising this was ok.

## 2012-11-09 ENCOUNTER — Ambulatory Visit: Payer: BC Managed Care – PPO | Admitting: Physical Therapy

## 2012-11-09 ENCOUNTER — Ambulatory Visit: Payer: BC Managed Care – PPO | Admitting: Occupational Therapy

## 2012-11-09 ENCOUNTER — Ambulatory Visit: Payer: BC Managed Care – PPO | Admitting: Speech Pathology

## 2012-11-11 ENCOUNTER — Ambulatory Visit: Payer: BC Managed Care – PPO | Admitting: Physical Therapy

## 2012-11-12 ENCOUNTER — Ambulatory Visit: Payer: BC Managed Care – PPO | Admitting: Occupational Therapy

## 2012-11-12 ENCOUNTER — Ambulatory Visit: Payer: BC Managed Care – PPO

## 2012-11-12 ENCOUNTER — Ambulatory Visit: Payer: BC Managed Care – PPO | Admitting: Physical Therapy

## 2012-11-16 ENCOUNTER — Ambulatory Visit: Payer: BC Managed Care – PPO | Admitting: Occupational Therapy

## 2012-11-17 ENCOUNTER — Encounter: Payer: Self-pay | Admitting: Physical Medicine & Rehabilitation

## 2012-11-17 ENCOUNTER — Ambulatory Visit: Payer: BC Managed Care – PPO | Admitting: Physical Therapy

## 2012-11-17 ENCOUNTER — Encounter
Payer: BC Managed Care – PPO | Attending: Physical Medicine & Rehabilitation | Admitting: Physical Medicine & Rehabilitation

## 2012-11-17 VITALS — BP 117/71 | HR 76 | Resp 14 | Ht 71.0 in | Wt 151.0 lb

## 2012-11-17 DIAGNOSIS — F068 Other specified mental disorders due to known physiological condition: Secondary | ICD-10-CM

## 2012-11-17 DIAGNOSIS — S020XXA Fracture of vault of skull, initial encounter for closed fracture: Secondary | ICD-10-CM | POA: Insufficient documentation

## 2012-11-17 DIAGNOSIS — S069X9S Unspecified intracranial injury with loss of consciousness of unspecified duration, sequela: Secondary | ICD-10-CM

## 2012-11-17 DIAGNOSIS — F09 Unspecified mental disorder due to known physiological condition: Secondary | ICD-10-CM | POA: Insufficient documentation

## 2012-11-17 DIAGNOSIS — S069XAA Unspecified intracranial injury with loss of consciousness status unknown, initial encounter: Secondary | ICD-10-CM

## 2012-11-17 DIAGNOSIS — F6381 Intermittent explosive disorder: Secondary | ICD-10-CM

## 2012-11-17 DIAGNOSIS — S069XAS Unspecified intracranial injury with loss of consciousness status unknown, sequela: Secondary | ICD-10-CM

## 2012-11-17 DIAGNOSIS — X58XXXA Exposure to other specified factors, initial encounter: Secondary | ICD-10-CM | POA: Insufficient documentation

## 2012-11-17 MED ORDER — DONEPEZIL HCL 5 MG PO TABS: 5.0000 mg | ORAL_TABLET | Freq: Every evening | ORAL | Status: DC | PRN

## 2012-11-17 NOTE — Patient Instructions (Signed)
ARICEPT: BEGIN AT 5MG  NIGHTLY  TEGRETOL: DECREASE TO 200MG  IN THE AM

## 2012-11-17 NOTE — Progress Notes (Signed)
Subjective:    Patient ID: Zachary Thornton, male    DOB: Sep 17, 1966, 46 y.o.   MRN: [id number]  HPI  Zachary Thornton is here in follow up of his severe TBI. I had spoken with rehab last week who had noticed some labile behavior and separation anxiety (from wife).  His wife feels that if anything he has experienced some improvement. She has taken him off the zyprexa with no problems. She tried to reduce the propranolol also but he did seem to become a bit more agitated, so she resumed. She feels the ginko biloba was helpful for memory. Mrs. Troutman tells me that his tegretol was switched to a generic and she has noticed more lethargy since then. He reamins on his amantadine.  Therapy had voiced to me that they are coming to a plateau at this point. Wife voiced some concerns about this to me.  They are looking into some programs to provide activity, supervision during the day including one near [place].   Pain Inventory Average Pain 0 Pain Right Now 0 My pain is n/a  In the last 24 hours, has pain interfered with the following? General activity 0 Relation with others 0 Enjoyment of life 0 What TIME of day is your pain at its worst? n/a Sleep (in general) Good  Pain is worse with: n/a Pain improves with: n/a Relief from Meds: n/a  Mobility walk without assistance how many minutes can you walk? 20 ability to climb steps?  yes do you drive?  no Do you have any goals in this area?  yes  Function disabled: date disabled  I need assistance with the following:  dressing, bathing, toileting, meal prep, household duties and shopping Do you have any goals in this area?  yes  Neuro/Psych weakness trouble walking dizziness confusion anxiety  Prior Studies Any changes since last visit?  no  Physicians involved in your care Any changes since last visit?  no   History reviewed. No pertinent family history. History   Social History  . Marital Status: Married    Spouse Name: N/A   Number of Children: N/A  . Years of Education: N/A   Social History Main Topics  . Smoking status: Former Games developer  . Smokeless tobacco: Never Used  . Alcohol Use: No  . Drug Use: No  . Sexually Active: None   Other Topics Concern  . None   Social History Narrative  . None   Past Surgical History  Procedure Date  . Csf shunt   . Craniotomy     Post TBI craniotomy and plate insertion   Past Medical History  Diagnosis Date  . TBI (traumatic brain injury)   . Back injury    BP 117/71  Pulse 76  Resp 14  Ht 5\' 11"  (1.803 m)  Wt 151 lb (68.493 kg)  BMI 21.06 kg/m2  SpO2 100%     Review of Systems  Musculoskeletal: Positive for gait problem.  Neurological: Positive for dizziness and weakness.  Psychiatric/Behavioral: Positive for confusion. The patient is nervous/anxious.   All other systems reviewed and are negative.       Objective:   Physical Exam  General: Alert and oriented x 3, No apparent distress  HEENT: Head is normocephalic, atraumatic, PERRLA, EOMI, sclera anicteric, oral mucosa pink and moist, dentition intact, ext ear canals clear,  Neck: Supple without JVD or lymphadenopathy  Heart: Reg rate and rhythm. No murmurs rubs or gallops  Chest: CTA bilaterally without wheezes, rales, or rhonchi;  no distress  Abdomen: Soft, non-tender, non-distended, bowel sounds positive.  Extremities: No clubbing, cyanosis, or edema. Pulses are 2+  Skin: Clean and intact without signs of breakdown  Neuro: Pt knows his name. He does not remember me. I asked him to find a room in the hall and he needed cues to find. He could not remember how to get back to his room.Visual fields were notable for mild peripheral loss around the right edges of his visual field, moreso at the inferior aspect. His voice was nasal but intelligbile. The remainder of CN was nremarkable. Strength was 5/5 on all 4 but he had diminished FMC of the right side with some apraxia noted. He walks with rigid  posture and drags the right leg a bit. He did not appear at imminent risk of falling. Sensation appeared to be grossly intact. he ambulated with good balance, however he used a straight-legged pattern and his heel strike was impaired.  Musculoskeletal: Full ROM, No pain with AROM or PROM in the neck, trunk, or extremities. Posture appropriate  Psych: [name] affect is flat but generally quite pleasant. Pt is cooperative   Assessment & Plan:   Assessment:  1. Severe TBI with depressed skull fxs. Injuries most prominent in the fronto-temporal areas bilaterally.  2. Cognitive-behavioral deficits due to the above  3. Gait disorder due the above  4. Mild depression    Plan:  1. I spent 30 minutes plus with the patient and his family today. .  2. Continue with outpt therapies as outlined. i have reviewed with the therapy team as well as Mrs. Drummer that he may be ready for a "break" where he may pursue other social interactions and activities. We could consider revisiting therapy in the new year to see if there are any other gains we can capture. Of course there is therapy available at [facility] and the other university settings which may be able to offer him a bit more. 3. Given that his wife reports more sedation with the tegretol generic, I decided to reduce his tegretol to 200mg  in the am and 400mg  in the pm. 4. Continue to work on memory exercises at home as well as efforts to improve some of his perseverative behaviors.  5. Maintain amantadine at 100mg  daily as the bid schedule    6. Aricept trial 5mg  qhs for memory. Could increase to 10mg .  Reviewed potential adverse effects. His wife thinks that ginko may have been helpful. 7. Maintain propranolol at current dosing for the time being. I'm ok with further reduction if presentation allows. Consider switch to LA form. 8. All questions were encouraged and answered. His wife will call me as needed. I will see him back in about 2 months.

## 2012-11-25 ENCOUNTER — Ambulatory Visit: Payer: BC Managed Care – PPO | Attending: Physical Medicine & Rehabilitation

## 2012-11-25 DIAGNOSIS — R4189 Other symptoms and signs involving cognitive functions and awareness: Secondary | ICD-10-CM | POA: Insufficient documentation

## 2012-11-25 DIAGNOSIS — R269 Unspecified abnormalities of gait and mobility: Secondary | ICD-10-CM | POA: Insufficient documentation

## 2012-11-25 DIAGNOSIS — R41840 Attention and concentration deficit: Secondary | ICD-10-CM | POA: Insufficient documentation

## 2012-11-25 DIAGNOSIS — R41841 Cognitive communication deficit: Secondary | ICD-10-CM | POA: Insufficient documentation

## 2012-11-25 DIAGNOSIS — Z5189 Encounter for other specified aftercare: Secondary | ICD-10-CM | POA: Insufficient documentation

## 2012-11-25 DIAGNOSIS — M6281 Muscle weakness (generalized): Secondary | ICD-10-CM | POA: Insufficient documentation

## 2012-11-25 DIAGNOSIS — R279 Unspecified lack of coordination: Secondary | ICD-10-CM | POA: Insufficient documentation

## 2012-11-27 ENCOUNTER — Ambulatory Visit: Payer: BC Managed Care – PPO | Admitting: Physical Therapy

## 2012-11-27 ENCOUNTER — Ambulatory Visit: Payer: BC Managed Care – PPO

## 2012-12-01 ENCOUNTER — Ambulatory Visit: Payer: BC Managed Care – PPO | Admitting: Physical Therapy

## 2012-12-01 ENCOUNTER — Ambulatory Visit: Payer: BC Managed Care – PPO

## 2012-12-04 ENCOUNTER — Ambulatory Visit: Payer: BC Managed Care – PPO

## 2012-12-04 ENCOUNTER — Ambulatory Visit: Payer: BC Managed Care – PPO | Admitting: Physical Therapy

## 2012-12-08 ENCOUNTER — Ambulatory Visit: Payer: BC Managed Care – PPO | Admitting: Physical Therapy

## 2012-12-08 ENCOUNTER — Ambulatory Visit: Payer: BC Managed Care – PPO

## 2012-12-11 ENCOUNTER — Ambulatory Visit: Payer: BC Managed Care – PPO

## 2012-12-11 ENCOUNTER — Ambulatory Visit: Payer: BC Managed Care – PPO | Admitting: Physical Therapy

## 2012-12-14 ENCOUNTER — Ambulatory Visit: Payer: BC Managed Care – PPO

## 2012-12-18 ENCOUNTER — Ambulatory Visit: Payer: BC Managed Care – PPO

## 2012-12-18 ENCOUNTER — Ambulatory Visit: Payer: BC Managed Care – PPO | Admitting: Physical Therapy

## 2012-12-22 ENCOUNTER — Ambulatory Visit: Payer: BC Managed Care – PPO | Admitting: Physical Therapy

## 2013-01-15 ENCOUNTER — Encounter
Payer: BC Managed Care – PPO | Attending: Physical Medicine & Rehabilitation | Admitting: Physical Medicine & Rehabilitation

## 2013-01-15 ENCOUNTER — Other Ambulatory Visit: Payer: Self-pay | Admitting: Physical Medicine & Rehabilitation

## 2013-01-15 ENCOUNTER — Encounter: Payer: Self-pay | Admitting: Physical Medicine & Rehabilitation

## 2013-01-15 VITALS — BP 110/71 | HR 75 | Resp 14 | Ht 71.0 in | Wt 151.0 lb

## 2013-01-15 DIAGNOSIS — S069X9A Unspecified intracranial injury with loss of consciousness of unspecified duration, initial encounter: Secondary | ICD-10-CM

## 2013-01-15 DIAGNOSIS — S069XAA Unspecified intracranial injury with loss of consciousness status unknown, initial encounter: Secondary | ICD-10-CM

## 2013-01-15 DIAGNOSIS — F09 Unspecified mental disorder due to known physiological condition: Secondary | ICD-10-CM

## 2013-01-15 DIAGNOSIS — S020XXA Fracture of vault of skull, initial encounter for closed fracture: Secondary | ICD-10-CM | POA: Insufficient documentation

## 2013-01-15 DIAGNOSIS — F6381 Intermittent explosive disorder: Secondary | ICD-10-CM | POA: Insufficient documentation

## 2013-01-15 DIAGNOSIS — X58XXXA Exposure to other specified factors, initial encounter: Secondary | ICD-10-CM | POA: Insufficient documentation

## 2013-01-15 DIAGNOSIS — S069XAS Unspecified intracranial injury with loss of consciousness status unknown, sequela: Secondary | ICD-10-CM

## 2013-01-15 DIAGNOSIS — S069X9S Unspecified intracranial injury with loss of consciousness of unspecified duration, sequela: Secondary | ICD-10-CM

## 2013-01-15 DIAGNOSIS — F068 Other specified mental disorders due to known physiological condition: Secondary | ICD-10-CM

## 2013-01-15 MED ORDER — DONEPEZIL HCL 10 MG PO TABS: 10.0000 mg | ORAL_TABLET | Freq: Every evening | ORAL | Status: DC | PRN

## 2013-01-15 MED ORDER — ATOMOXETINE HCL 10 MG PO CAPS: 10.0000 mg | ORAL_CAPSULE | Freq: Every day | ORAL | Status: DC

## 2013-01-15 MED ORDER — PROPRANOLOL HCL 20 MG PO TABS: 40.0000 mg | ORAL_TABLET | Freq: Two times a day (BID) | ORAL | Status: DC

## 2013-01-15 NOTE — Patient Instructions (Signed)
STOP AMANTADINE  BEGIN STRATTERA 10MG  DAILY FOR ONE WEEK-----THEN..  INCREASE ARICEPT TO 10MG  NIGHTLY

## 2013-01-15 NOTE — Progress Notes (Signed)
Subjective:    Patient ID: Zachary Thornton, male    DOB: 01/22/1966, 47 y.o.   MRN: [id number]  HPI  Zachary Thornton is back regarding his severe TBI. We had decided at last visit to decrease his tegretol due to fatigue. His wife noticed, however, that he was becoming more reclusive off of this medicine, even getting to the point where he didn't want to come out of his room. She resumed the tegretol at the 400mg  bid dose and he showed immediate improvement. She also backed off the pm amantadine dose as it seemed to cause some agitation. The family has noticed improvement with the aricept in regards to his memory. No side effects were seen with the drug. He essentially has stopped going to therapy as he needed a bit of a "break". He is getting some therapy and interaction at home with someone his wife personally hired. It also gives her a bit of a respite.   Wife reports no issues with sleep, appetite, bowel or bladder function, or pain.  Pain Inventory Average Pain 0 Pain Right Now 0 My pain is n/a  In the last 24 hours, has pain interfered with the following? General activity 0 Relation with others 0 Enjoyment of life 0 What TIME of day is your pain at its worst? n/a Sleep (in general) Good  Pain is worse with: n/a Pain improves with: n/a Relief from Meds: n/a  Mobility walk without assistance ability to climb steps?  yes do you drive?  no Do you have any goals in this area?  yes  Function not employed: date last employed  I need assistance with the following:  bathing, meal prep, household duties and shopping Do you have any goals in this area?  yes  Neuro/Psych confusion anxiety  Prior Studies Any changes since last visit?  no  Physicians involved in your care Any changes since last visit?  no   History reviewed. No pertinent family history. History   Social History  . Marital Status: Married    Spouse Name: N/A    Number of Children: N/A  . Years of Education: N/A    Social History Main Topics  . Smoking status: Former Games developer  . Smokeless tobacco: Never Used  . Alcohol Use: No  . Drug Use: No  . Sexually Active: None   Other Topics Concern  . None   Social History Narrative  . None   Past Surgical History  Procedure Date  . Csf shunt   . Craniotomy     Post TBI craniotomy and plate insertion   Past Medical History  Diagnosis Date  . TBI (traumatic brain injury)   . Back injury    BP 110/71  Pulse 75  Resp 14  Ht 5\' 11"  (1.803 m)  Wt 151 lb (68.493 kg)  BMI 21.06 kg/m2  SpO2 98%     Review of Systems  Psychiatric/Behavioral: Positive for confusion. The patient is nervous/anxious.   All other systems reviewed and are negative.       Objective:   Physical Exam  General: Alert and oriented x 3, No apparent distress  HEENT: Head is normocephalic, atraumatic, PERRLA, EOMI, sclera anicteric, oral mucosa pink and moist, dentition intact, ext ear canals clear,  Neck: Supple without JVD or lymphadenopathy  Heart: Reg rate and rhythm. No murmurs rubs or gallops  Chest: CTA bilaterally without wheezes, rales, or rhonchi; no distress  Abdomen: Soft, non-tender, non-distended, bowel sounds positive.  Extremities: No clubbing, cyanosis, or edema.  Pulses are 2+  Skin: Clean and intact without signs of breakdown  Neuro: Pt knows his name. He does not remember me. He doesn't acknowledge any of his deficits nor does he believe he's here for his own visit (thinks he's here for wife).Visual fields were notable for mild peripheral loss around the right edges of his visual field, moreso at the inferior aspect. His voice was nasal but intelligbile. The remainder of CN was nremarkable. Strength was 5/5 on all 4 but he had diminished FMC of the right side with some apraxia noted. He walks with rigid posture and drags the right leg a bit. He did not appear at imminent risk of falling. Sensation appeared to be grossly intact. Gait is stable with  steppage pattern. Musculoskeletal: Full ROM, No pain with AROM or PROM in the neck, trunk, or extremities. Posture appropriate  Psych: pt is pleasant and cooperative   Assessment & Plan:   Assessment:  1. Severe TBI with depressed skull fxs. Injuries most prominent in the fronto-temporal areas bilaterally.  2. Cognitive-behavioral deficits due to the above  3. Gait disorder due the above  4. Mild depression    Plan:  1. I spent 30 minutes plus with the patient and his wife today.  2. Will take a definite break from therapy with work to be done at home as noted above.  3. Continue with tegretol 40mg  bid .  5. Will stop amantadine and begin a trial of strattera 10mg  qday to see if this provides any further benfit with attention and memory without agitation side effects.  This likely will need titration. 6. Increase aricept to 10mg  nightly beginning in about one week. He certainly may maintain the ginko biloba, and he may try omega 3 fatty acids as well..  7. Maintain propranolol at current dosing 40mg  bid for the time being. Again can consider switch to LA form 8. All questions were encouraged and answered. His wife will call me as needed. I will see him back in about 2 months.

## 2013-01-29 ENCOUNTER — Telehealth: Payer: Self-pay | Admitting: *Deleted

## 2013-01-29 MED ORDER — PROPRANOLOL HCL 20 MG PO TABS: 40.0000 mg | ORAL_TABLET | Freq: Two times a day (BID) | ORAL | Status: DC

## 2013-01-29 NOTE — Telephone Encounter (Signed)
Has been three days since since she reduced the Aricept back to the 5 mg and the agitation has not shown any sign of reduction.

## 2013-01-29 NOTE — Telephone Encounter (Signed)
Pt spouse is calling because the last time he was here Dr. Riley Kill increased Aricept from 5mg  to 10mg . He has been having increased agitation since increasing the medication.   Pt spouse is wanting to speak to Dr. Riley Kill or a clinical staff member about this.

## 2013-01-29 NOTE — Telephone Encounter (Signed)
Pt spouse is aware of this.  She is requesting a refill on propranolol, this has been sent in.

## 2013-01-29 NOTE — Telephone Encounter (Signed)
Would stop aricept completely for now. If agitation is severe, can increase propranolol temporarily to 40mg  tid.   If he is still taking straterra, we may want to hold this as well until agitation subsides

## 2013-02-03 ENCOUNTER — Telehealth: Payer: Self-pay

## 2013-02-03 NOTE — Telephone Encounter (Signed)
Patients wife called with some concerns about her husband.  She is concerned about his medications and behavior.  She would like to know if she can make a phone appointment to discuss this or what other options she has.

## 2013-02-06 ENCOUNTER — Other Ambulatory Visit: Payer: Self-pay

## 2013-02-08 ENCOUNTER — Telehealth: Payer: Self-pay | Admitting: Physical Medicine & Rehabilitation

## 2013-02-08 NOTE — Telephone Encounter (Signed)
Called wife and left a message to call regarding a time i can call

## 2013-02-09 ENCOUNTER — Telehealth: Payer: Self-pay | Admitting: Physical Medicine & Rehabilitation

## 2013-02-09 NOTE — Telephone Encounter (Signed)
Dr. Riley Kill requested a time frame to speak with Victorino Dike.  She is available Wedesday after 12:00 PM, or Friday after 12:30 PM.

## 2013-02-10 NOTE — Telephone Encounter (Signed)
i spoke with his wife today. Propranolol 60mg  bid for now has helped

## 2013-02-11 ENCOUNTER — Emergency Department (HOSPITAL_COMMUNITY)
Admission: EM | Admit: 2013-02-11 | Discharge: 2013-02-12 | Disposition: A | Discharge status: Home / Self Care | Payer: BC Managed Care – PPO | Attending: Emergency Medicine | Admitting: Emergency Medicine

## 2013-02-11 ENCOUNTER — Encounter (HOSPITAL_COMMUNITY): Payer: Self-pay | Admitting: Emergency Medicine

## 2013-02-11 ENCOUNTER — Telehealth: Payer: Self-pay

## 2013-02-11 DIAGNOSIS — S069X0S Unspecified intracranial injury without loss of consciousness, sequela: Secondary | ICD-10-CM

## 2013-02-11 DIAGNOSIS — R4182 Altered mental status, unspecified: Secondary | ICD-10-CM

## 2013-02-11 DIAGNOSIS — F22 Delusional disorders: Secondary | ICD-10-CM

## 2013-02-11 DIAGNOSIS — F200 Paranoid schizophrenia: Secondary | ICD-10-CM | POA: Insufficient documentation

## 2013-02-11 DIAGNOSIS — I69998 Other sequelae following unspecified cerebrovascular disease: Secondary | ICD-10-CM | POA: Insufficient documentation

## 2013-02-11 LAB — ACETAMINOPHEN LEVEL: Acetaminophen (Tylenol), Serum: <15 ug/mL (ref 10–30)

## 2013-02-11 LAB — RAPID URINE DRUG SCREEN, HOSP PERFORMED
Amphetamines: NOT DETECTED
Barbiturates: NOT DETECTED
Benzodiazepines: NOT DETECTED
Cocaine: NOT DETECTED
Opiates: NOT DETECTED
Tetrahydrocannabinol: NOT DETECTED

## 2013-02-11 LAB — COMPREHENSIVE METABOLIC PANEL
ALT: 17 U/L (ref 0–53)
AST: 15 U/L (ref 0–37)
Albumin: 3.8 g/dL (ref 3.5–5.2)
Alkaline Phosphatase: 82 U/L (ref 39–117)
Calcium: 9.7 mg/dL (ref 8.4–10.5)
Glucose, Bld: 108 mg/dL — ABNORMAL HIGH (ref 70–99)
Potassium: 4.1 mEq/L (ref 3.5–5.1)
Sodium: 142 mEq/L (ref 135–145)
Total Protein: 7.3 g/dL (ref 6.0–8.3)

## 2013-02-11 LAB — CBC
Hemoglobin: 15 g/dL (ref 13.0–17.0)
MCH: 32.3 pg (ref 26.0–34.0)
MCHC: 34.5 g/dL (ref 30.0–36.0)
Platelets: 233 10*3/uL (ref 150–400)

## 2013-02-11 LAB — CARBAMAZEPINE LEVEL, TOTAL: Carbamazepine Lvl: 6.7 ug/mL (ref 4.0–12.0)

## 2013-02-11 MED ORDER — ADULT MULTIVITAMIN W/MINERALS CH
1.0000 | ORAL_TABLET | Freq: Every day | ORAL | Status: DC
  Administered 2013-02-11 – 2013-02-12 (×2): 1 via ORAL
  Filled 2013-02-11 (×2): qty 1

## 2013-02-11 MED ORDER — LORAZEPAM 1 MG PO TABS
1.0000 mg | ORAL_TABLET | Freq: Three times a day (TID) | ORAL | Status: DC | PRN
  Administered 2013-02-11 – 2013-02-12 (×3): 1 mg via ORAL
  Filled 2013-02-11 (×3): qty 1

## 2013-02-11 MED ORDER — VITAMIN D3 25 MCG (1000 UNIT) PO TABS
1000.0000 [IU] | ORAL_TABLET | Freq: Every day | ORAL | Status: DC
  Administered 2013-02-11 – 2013-02-12 (×2): 1000 [IU] via ORAL
  Filled 2013-02-11 (×2): qty 1

## 2013-02-11 MED ORDER — AMANTADINE HCL 100 MG PO CAPS
100.0000 mg | ORAL_CAPSULE | Freq: Every day | ORAL | Status: DC
  Administered 2013-02-11 – 2013-02-12 (×2): 100 mg via ORAL
  Filled 2013-02-11 (×2): qty 1

## 2013-02-11 MED ORDER — PROPRANOLOL HCL 40 MG PO TABS
40.0000 mg | ORAL_TABLET | Freq: Two times a day (BID) | ORAL | Status: DC
  Administered 2013-02-11 – 2013-02-12 (×2): 40 mg via ORAL
  Filled 2013-02-11 (×3): qty 1

## 2013-02-11 MED ORDER — CARBAMAZEPINE ER 100 MG PO TB12
100.0000 mg | ORAL_TABLET | Freq: Two times a day (BID) | ORAL | Status: DC
  Administered 2013-02-11 – 2013-02-12 (×2): 100 mg via ORAL
  Filled 2013-02-11 (×3): qty 1

## 2013-02-11 MED ORDER — ATOMOXETINE HCL 10 MG PO CAPS
10.0000 mg | ORAL_CAPSULE | Freq: Every day | ORAL | Status: DC
  Filled 2013-02-11 (×2): qty 1

## 2013-02-11 NOTE — Telephone Encounter (Signed)
Patients wife Victorino Dike called concerned about Scotts paranoia increasing.  She called back again and said she had called 911 and patient is going to [facility].

## 2013-02-11 NOTE — ED Notes (Signed)
Neurology at bedside.

## 2013-02-11 NOTE — Progress Notes (Signed)
CSW spoke with pt wife, and patient has been approved for disability. Pt wife shared that pt may need group home placement. CSW will provide list for patient wife to look over. Pt wife plans to follow up with [facility] regarding medicaid, and resources within the community. Pt wife expressed her concerns regarding safety of patient returning home.   Doree Albee  [phone] 02/11/2013 20:46pm

## 2013-02-11 NOTE — Consult Note (Signed)
Reason for Consult: Abnormal behaviors and impulsive threats. Hx of TBI Referring Physician: Dr. Cherrie Thornton is an 47 y.o. male.  HPI: Patient was seen and chart reviewed including neurology consultation. Patient has cognitive deficits, excessive talkative and some time difficult to follow through. He has not able to answer most of the orientation, calculations and memory related questions. He has difficulty to answer most obvious and simple questions. He is a poor historian. Will obtain further information from his wife  MSE: Patient denied depression, anxiety and psychosis. He denied SI/HI, irritability, agitation and aggression. He has limited insight, judgment and impulse control.  Past Medical History  Diagnosis Date  . TBI (traumatic brain injury)   . Back injury     Past Surgical History  Procedure Laterality Date  . Csf shunt    . Craniotomy      Post TBI craniotomy and plate insertion    No family history on file.  Social History:  reports that he has quit smoking. He has never used smokeless tobacco. He reports that he does not drink alcohol or use illicit drugs.  Allergies: No Known Allergies  Medications: I have reviewed the patient's current medications.  Results for orders placed during the hospital encounter of 02/11/13 (from the past 48 hour(s))  URINE RAPID DRUG SCREEN (HOSP PERFORMED)     Status: None   Collection Time    02/11/13 11:35 AM      Result Value Range   Opiates NONE DETECTED  NONE DETECTED   Cocaine NONE DETECTED  NONE DETECTED   Benzodiazepines NONE DETECTED  NONE DETECTED   Amphetamines NONE DETECTED  NONE DETECTED   Tetrahydrocannabinol NONE DETECTED  NONE DETECTED   Barbiturates NONE DETECTED  NONE DETECTED   Comment:            DRUG SCREEN FOR MEDICAL PURPOSES     ONLY.  IF CONFIRMATION IS NEEDED     FOR ANY PURPOSE, NOTIFY LAB     WITHIN 5 DAYS.                LOWEST DETECTABLE LIMITS     FOR URINE DRUG SCREEN     Drug  Class       Cutoff (ng/mL)     Amphetamine      1000     Barbiturate      200     Benzodiazepine   200     Tricyclics       300     Opiates          300     Cocaine          300     THC              50  ACETAMINOPHEN LEVEL     Status: None   Collection Time    02/11/13 11:44 AM      Result Value Range   Acetaminophen (Tylenol), Serum <15.0  10 - 30 ug/mL   Comment:            THERAPEUTIC CONCENTRATIONS VARY     SIGNIFICANTLY. A RANGE OF 10-30     ug/mL MAY BE AN EFFECTIVE     CONCENTRATION FOR MANY PATIENTS.     HOWEVER, SOME ARE BEST TREATED     AT CONCENTRATIONS OUTSIDE THIS     RANGE.     ACETAMINOPHEN CONCENTRATIONS     >150 ug/mL AT 4 HOURS AFTER  INGESTION AND >50 ug/mL AT 12     HOURS AFTER INGESTION ARE     OFTEN ASSOCIATED WITH TOXIC     REACTIONS.  CBC     Status: None   Collection Time    02/11/13 11:44 AM      Result Value Range   WBC 8.2  4.0 - 10.5 K/uL   RBC 4.65  4.22 - 5.81 MIL/uL   Hemoglobin 15.0  13.0 - 17.0 g/dL   HCT 16.1  09.6 - 04.5 %   MCV 93.5  78.0 - 100.0 fL   MCH 32.3  26.0 - 34.0 pg   MCHC 34.5  30.0 - 36.0 g/dL   RDW 40.9  81.1 - 91.4 %   Platelets 233  150 - 400 K/uL  COMPREHENSIVE METABOLIC PANEL     Status: Abnormal   Collection Time    02/11/13 11:44 AM      Result Value Range   Sodium 142  135 - 145 mEq/L   Potassium 4.1  3.5 - 5.1 mEq/L   Chloride 106  96 - 112 mEq/L   CO2 26  19 - 32 mEq/L   Glucose, Bld 108 (*) 70 - 99 mg/dL   BUN 24 (*) 6 - 23 mg/dL   Creatinine, Ser 7.82  0.50 - 1.35 mg/dL   Calcium 9.7  8.4 - 95.6 mg/dL   Total Protein 7.3  6.0 - 8.3 g/dL   Albumin 3.8  3.5 - 5.2 g/dL   AST 15  0 - 37 U/L   ALT 17  0 - 53 U/L   Alkaline Phosphatase 82  39 - 117 U/L   Total Bilirubin 0.4  0.3 - 1.2 mg/dL   GFR calc non Af Amer >90  >90 mL/min   GFR calc Af Amer >90  >90 mL/min   Comment:            The eGFR has been calculated     using the CKD EPI equation.     This calculation has not been     validated in  all clinical     situations.     eGFR's persistently     <90 mL/min signify     possible Chronic Kidney Disease.  ETHANOL     Status: None   Collection Time    02/11/13 11:44 AM      Result Value Range   Alcohol, Ethyl (B) <11  0 - 11 mg/dL   Comment:            LOWEST DETECTABLE LIMIT FOR     SERUM ALCOHOL IS 11 mg/dL     FOR MEDICAL PURPOSES ONLY  SALICYLATE LEVEL     Status: Abnormal   Collection Time    02/11/13 11:44 AM      Result Value Range   Salicylate Lvl <2.0 (*) 2.8 - 20.0 mg/dL  GLUCOSE, CAPILLARY     Status: Abnormal   Collection Time    02/11/13 11:47 AM      Result Value Range   Glucose-Capillary 101 (*) 70 - 99 mg/dL   Comment 1 Notify RN      No results found.  Positive for TBI, and cognitive deficits Blood pressure 104/71, pulse 67, temperature 98.4 F (36.9 C), temperature source Oral, resp. rate 16, SpO2 98.00%.   Assessment/Plan: Mood disorder NOS Traumatic brain injury Cognitive deficits  Recommendation: Recommend check his CBZ level and adjust as needed. He may benefit from Zyprexa 5 mg at  bed time as needed for agitation and aggression. He does not meet criteria of acute psychiatric hospitalization. He will be benefited out of home placement if desired by family.     Zachary Thornton,Zachary Thornton. 02/11/2013, 5:56 PM

## 2013-02-11 NOTE — ED Notes (Addendum)
Per ems, pt had TBI 2 years ago, since then pt has experienced paranoia. Pt wife reports pts paranoia has gotten increasingly worse lately, before today pt was compliant with meds, today pt did not take medications. Pt was cooperative with ems.   Pt prefers to be called Zachary Thornton. Wife wants pt to go to [facility]

## 2013-02-11 NOTE — ED Provider Notes (Signed)
Case manager talked to the patient's wife.Pt had TBI a few years ago. She reports needing more resources to help take care of the patient and possibly even group home placement. Patient has been evaluated by Dr. Elsie Saas., psychiatrist who wanted a Tegretol level ito be checked and if subtherapeutic increase his dose of Tegretol to help control his agitation. Wife is comfortable taking patient home however it will probably be in the morning. Pt not meeting criteria for psychiatric admission or medical admission.   Tegretol level therapeutic  Devoria Albe, MD, [facility]   Ward Givens, MD 02/11/13 [phone]

## 2013-02-11 NOTE — ED Provider Notes (Signed)
History     CSN: [id number]  Arrival date & time 02/11/13  1127   First MD Initiated Contact with Patient 02/11/13 1146      Chief Complaint  Patient presents with  . Medical Clearance  . Paranoid  . hx of TBI     (Consider location/radiation/quality/duration/timing/severity/associated sxs/prior treatment) HPI The patient presents with increasingly idiosyncratic, paranoid behavior.  The patient has a notable history of right brain injury 2 years ago, with subsequent significant decline in functional status.  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 The patient's wife states that over the past weeks, and in particular over the past days the patient has become much was cooperative, with increasingly agitated behavior and verbal threats of harming himself and others. Within the past 3 weeks the patient has had several medication changes, including propanolol, Aricept, amantadine dosage changes.  The patient denies physical pain, and his wife states that he has not had any new fever, cough, congestion, other overt signs of illness. Past Medical History  Diagnosis Date  . TBI (traumatic brain injury)   . Back injury     Past Surgical History  Procedure Laterality Date  . Csf shunt    . Craniotomy      Post TBI craniotomy and plate insertion    No family history on file.  History  Substance Use Topics  . Smoking status: Former Games developer  . Smokeless tobacco: Never Used  . Alcohol Use: No      Review of Systems  Unable to perform ROS: Dementia    Allergies  Review of patient's allergies indicates no known allergies.  Home Medications   Current Outpatient Rx  Name  Route  Sig  Dispense  Refill  . amantadine (SYMMETREL) 100 MG capsule   Oral   Take 100 mg by mouth daily.          [name]  atomoxetine (STRATTERA) 10 MG capsule   Oral   Take 1 capsule (10 mg total) by mouth daily.   30 capsule   3   . carbamazepine (TEGRETOL XR) 400 MG 12 hr tablet      TAKE ONE TABLET BY MOUTH TWICE DAILY   60 tablet   2   . cholecalciferol (VITAMIN D) 1000 UNITS tablet   Oral   Take 1,000 Units by mouth daily.         [name] donepezil (ARICEPT) 10 MG tablet   Oral   Take 1 tablet (10 mg total) by mouth at bedtime as needed.   30 tablet   3   . fish oil-omega-3 fatty acids 1000 MG capsule   Oral   Take 1 g by mouth daily.         . Ginkgo Biloba 40 MG TABS   Oral   Take by mouth.         . Multiple Vitamin (MULTIVITAMIN WITH MINERALS) TABS   Oral   Take 1 tablet by mouth daily.         . propranolol (INDERAL) 20 MG tablet   Oral   Take 2 tablets (40 mg total) by mouth 2 (two) times daily.   120 tablet   0     BP 104/71  Pulse 67  Temp(Src) 98.4 F (36.9 C) (Oral)  Resp 16  SpO2 98%  Physical Exam  Nursing note and  vitals reviewed. Constitutional: He is oriented to person, place, and time. He appears well-developed. No distress.  HENT:  Head: Normocephalic and atraumatic.  Eyes: Conjunctivae and EOM are normal.  Cardiovascular: Normal rate and regular rhythm.   Pulmonary/Chest: Effort normal. No stridor. No respiratory distress.  Abdominal: He exhibits no distension.  Musculoskeletal: He exhibits no edema.  Neurological: He is alert and oriented to person, place, and time.  Skin: Skin is warm and dry.  Psychiatric: His mood appears anxious. His speech is delayed and tangential. He is slowed and withdrawn. Thought content is paranoid and delusional. Cognition and memory are impaired. He expresses impulsivity. He is inattentive.    ED Course  Procedures (including critical care time)  Labs Reviewed  COMPREHENSIVE METABOLIC PANEL - Abnormal; Notable for the following:    Glucose, Bld 108 (*)    BUN 24 (*)    All other components within normal limits   SALICYLATE LEVEL - Abnormal; Notable for the following:    Salicylate Lvl <2.0 (*)    All other components within normal limits  GLUCOSE, CAPILLARY - Abnormal; Notable for the following:    Glucose-Capillary 101 (*)    All other components within normal limits  ACETAMINOPHEN LEVEL  CBC  ETHANOL  URINE RAPID DRUG SCREEN (HOSP PERFORMED)   No results found.   No diagnosis found.  After my initial evaluation I discussed the patient's presentation with your neurologist who also evaluated the patient.  Given the complexity of the patient's psychiatric history, histrionic history, we discussed the need for additional psychiatric eval.  MDM  This male with history of TBI, now presenting with increasingly idiosyncratic behavior, verbal aggression's, but without pain, and hemodynamic stable will be transferred to the TCU for additional evaluation by our psychiatry team.  Gerhard Munch, MD 02/11/13 1416

## 2013-02-11 NOTE — ED Notes (Addendum)
Report given to milner, rn

## 2013-02-11 NOTE — BH Assessment (Signed)
Assessment Note   Zachary Thornton is an 47 y.o. male. Patient presents due to increased agitation and anger outburst. Patient presents very confused and only oriented to self. He denies any suicidal or homicidal thoughts, also denies any auditory or visual hallucinations. Patient has no prior history of hospitalization. Spoke with patient's wife who stated that patient has had several medication changes lately and doesn't feel as if he is currently on the correct combination to help with his increasing anger outburst. She states that patient is becoming more unmanageable and harder to redirect. He refused his medications this AM which is one of the reasons she brought him into the ED. She stated that he is starting to make physical threats which makes her feel unsafe. She currently has a 47 year old in the home as well as her elderly mother-in-law.  Patient sustained his TBI 2 years ago when he fell downstairs (4 steps) in their basement and hit a concrete landing. Patient is currently interested in possible group home placement. Patient was referred to [facility] in [place] who discussed referral options with patient's spouse.  Axis I: TBI Axis II: Deferred Axis III:  Past Medical History  Diagnosis Date  . TBI (traumatic brain injury)   . Back injury    Axis IV: problems with access to health care services and problems with primary support group Axis V: 51-60 moderate symptoms  Past Medical History:  Past Medical History  Diagnosis Date  . TBI (traumatic brain injury)   . Back injury     Past Surgical History  Procedure Laterality Date  . Csf shunt    . Craniotomy      Post TBI craniotomy and plate insertion    Family History: No family history on file.  Social History:  reports that he has quit smoking. He has never used smokeless tobacco. He reports that he does not drink alcohol or use illicit drugs.  Additional Social History:  Alcohol / Drug Use History of alcohol / drug use?: No  history of alcohol / drug abuse  CIWA: CIWA-Ar BP: 104/71 mmHg Pulse Rate: 67 COWS:    Allergies: No Known Allergies  Home Medications:  (Not in a hospital admission)  OB/GYN Status:  No LMP for male patient.  General Assessment Data Location of Assessment: [facility] Living Arrangements: Spouse/significant other (Mother [place], 68 yo child) Can pt return to current living arrangement?: Yes Admission Status: Voluntary Is patient capable of signing voluntary admission?: No Transfer from: Home Referral Source: MD  Education Status Is patient currently in school?: No Highest grade of school patient has completed:  (Some college) Contact person:  (Wife/ [employer])  Risk to self Suicidal Ideation: No Suicidal Intent: No Is patient at risk for suicide?: No Suicidal Plan?: No Access to Means: No What has been your use of drugs/alcohol within the last 12 months?:  (Denies) Previous Attempts/Gestures: No How many times?:  (Denies) Other Self Harm Risks:  (Denies) Triggers for Past Attempts: None known Intentional Self Injurious Behavior: None Family Suicide History: No Recent stressful life event(s):  (NA) Persecutory voices/beliefs?: No Depression: No Depression Symptoms:  (Na) Substance abuse history and/or treatment for substance abuse?: No Suicide prevention information given to non-admitted patients: Not applicable  Risk to Others Homicidal Ideation: No Thoughts of Harm to Others: No Current Homicidal Intent: No Current Homicidal Plan: No Access to Homicidal Means: No Identified Victim:  (Na) History of harm to others?: No Assessment of Violence: None Noted Violent Behavior  Description:  (Na) Does patient have access to weapons?: No Criminal Charges Pending?: No Does patient have a court date: No  Psychosis Hallucinations: None noted Delusions: None noted  Mental Status Report Appear/Hygiene:  (WNL) Eye Contact: Good Motor Activity: Freedom of  movement;Unremarkable Speech: Logical/coherent Level of Consciousness: Alert Mood: Anxious Affect: Appropriate to circumstance Anxiety Level: Minimal Thought Processes: Irrelevant Judgement: Impaired Orientation: Person;Place Obsessive Compulsive Thoughts/Behaviors: None  Cognitive Functioning Concentration: Decreased Memory: Recent Impaired;Remote Impaired IQ: Average Insight: Fair Impulse Control: Fair Appetite: Fair Weight Loss:  (Na) Weight Gain:  (Na) Sleep: No Change Total Hours of Sleep:  (Varies) Vegetative Symptoms: None  ADLScreening [facility] Assessment Services) Patient's cognitive ability adequate to safely complete daily activities?: Yes Patient able to express need for assistance with ADLs?: Yes Independently performs ADLs?: Yes (appropriate for developmental age)  Abuse/Neglect [facility]) Physical Abuse: Denies Verbal Abuse: Denies Sexual Abuse: Denies  Prior Inpatient Therapy Prior Inpatient Therapy: No  Prior Outpatient Therapy Prior Outpatient Therapy: No  ADL Screening (condition at time of admission) Patient's cognitive ability adequate to safely complete daily activities?: Yes Patient able to express need for assistance with ADLs?: Yes Independently performs ADLs?: Yes (appropriate for developmental age)       Abuse/Neglect Assessment (Assessment to be complete while patient is alone) Physical Abuse: Denies Verbal Abuse: Denies Sexual Abuse: Denies Exploitation of patient/patient's resources: Denies Self-Neglect: Denies Values / Beliefs Cultural Requests During Hospitalization: None Spiritual Requests During Hospitalization: None        Additional Information 1:1 In Past 12 Months?: No CIRT Risk: No Elopement Risk: No Does patient have medical clearance?: Yes     Disposition:  Disposition Disposition of Patient: Other dispositions;Referred to Other disposition(s): To current provider Patient referred to:  (Current provider)  On  Site Evaluation by: Dr. Elsie Saas  Reviewed with Physician:     Rudi Coco 02/11/2013 8:24 PM

## 2013-02-11 NOTE — Consult Note (Signed)
Reason for Consult: Abnormal behavior Referring Physician: Jeraldine Loots, R.  CC: Abnormal behavior  History is obtained from: Wife  HPI: Zachary Thornton is a 47 y.o. male with a history of traumatic brain injury in 2012. Following this, he has had persistent behavioral problems, well as cognitive impairment. He moved to [place] about 6 months ago, and prior to that was being managed in [place]. He was on multiple medications or started during an approximately 2 month long stay that were essentially sedating. Since moving here he has had his medications titrated. He currently is taking carbamazepine, amantadine, and propanolol. He was managed previously on olanzapine, as well as Haldol, and did well on those. In simplifying his regimen it was decided to discontinue antipsychotics. This was 3-4 months ago.  A few weeks ago, he was started on Aricept for memory. This was started at 5 mg, and later titrated up to 10 mg. Approximately 4 days after increasing to 10 mg, he became much more agitated as well as paranoid. This was discontinued, but 2 weeks later he continues to have problems.  His wife states that he was threatening to "blow his brains out" as well as "blow everybody up." She did not feel safe and therefore called 911 to have him transported to the emergency room.  Of note, prior to his traumatic brain injury, he would have periods of depression as well as episodes where he would need very little sleep, work Programmer, systems, and do things that he would later regret.  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  Past Medical History  Diagnosis Date  . TBI (traumatic brain injury)   . Back injury     Family History:  no history  of psychiatric disease   Social History: Tob:  lives with mother, wife and son   Exam: Current vital signs: BP 104/71  Pulse 67  Temp(Src) 98.4 F (36.9 C) (Oral)  Resp 16  SpO2 98% Vital signs in last 24 hours: Temp:  [98.4 F (36.9 C)-98.6 F (37 C)] 98.4 F (36.9 C) (02/20 1338) Pulse Rate:  [65-67] 67 (02/20 1338) Resp:  [16-26] 16 (02/20 1338) BP: (104-131)/(71-93) 104/71 mmHg (02/20 1338) SpO2:  [98 %-100 %] 98 % (02/20 1338)  General:  in bed, flat affect  CV:  Regular rate and rhythm  Mental Status: Patient is awake, alert, he is not oriented to hospital, month, or year. He does know that he is in [facility]. He follows commands.  Cranial Nerves: II: Visual Fields are full. Pupils are equal, round, and reactive to light.  Discs are sharp. III,IV, VI: EOMI without ptosis or diploplia.  V: Facial sensation is symmetric to temperature VII: Facial movement is symmetric.  VIII: hearing is intact to voice X: Uvula elevates symmetrically XI: Shoulder shrug is symmetric. XII: tongue is midline without atrophy or fasciculations.  Motor: Tone is normal. Mild weakness throughout right side, intact on left.  Sensory: Sensation is symmetric to light touch and temperature in the arms and legs Deep Tendon Reflexes: 2+ and symmetric in the biceps and patellae.  Cerebellar: FNF intact on left, consistent with weakness on right.  Gait: Patient leans to right, but is  able to walk unassisted.   I have reviewed labs in epic and the results pertinent to this consultation are: CBC, CMP unremarkable.   I have reviewed the images obtained: Previous CT, Bilateral frontal lobe injury as well as left temporal lobe injury. Previous occipital crani and shunt placement.   Impression: 47 yo M with TBI and worsening of aggression, agitation and possibly suicidal/homicidal ideation. I do not think that there is a new injury, but rather this represents sequelae of his old  injury. At this point, the primary endpoint his behavioral control and I think that antipsychotics may be of benefit to him.  I am concerned with his threatening behavior towards his family, as well as indications that he may wish to do harm to himself and recommend psychiatry consultation.  I do wonder what is possible history of manic episodes, if he could be having an event like mania contributing to his current paranoia and aggressiveness.  Recommendations: 1) Would consult psychiatry 2) Consider atypical antipsychotics, but would defer to psychiatry.   Ritta Slot, MD [facility] [phone]  If 7pm- 7am, please page neurology on call at [phone].

## 2013-02-11 NOTE — ED Notes (Signed)
LKG:MW10<UV> Expected date:02/11/13<BR> Expected time:10:45 AM<BR> Means of arrival:Ambulance<BR> Comments:<BR> Delusional/have security at bedside

## 2013-02-11 NOTE — ED Notes (Signed)
Pt asked why he is here and states that he is unsure. 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 Pt states he was taken then and brought here to the hospital. Pt also states that he works for the [employer] and just found out he lost it today.

## 2013-02-12 ENCOUNTER — Telehealth: Payer: Self-pay | Admitting: *Deleted

## 2013-02-12 MED ORDER — OLANZAPINE 5 MG PO TABS: 5.0000 mg | ORAL_TABLET | Freq: Every evening | ORAL | Status: DC | PRN

## 2013-02-12 NOTE — ED Notes (Signed)
Pt up at nursing station stating "I have a million dollar company I have to get to or I will be sued". Pt reoriented. Verbalized understanding.

## 2013-02-12 NOTE — ED Provider Notes (Addendum)
Filed Vitals:   02/12/13 0628  BP: 92/62  Pulse: 72  Temp: 97.7 F (36.5 C)  Resp: 18   Pt with no acute events overnight. Psychiatry met with pt. No need for emergent medical or psychiatric hospitalization. Social work Conservation officer, nature. Pt qualified for disability. Trying to possibly get placement in group home. Wife apparently to pick up today.  Raeford Razor, MD 02/12/13 [phone]  8:42 AM Wife arrived to pick pt up. I spoke with her. Upset about pt being discharged. She would like him to be placed. Psychiatry evaluated pt and did not feel that he is in need of emergent psychiatric admission. This discussed with her. Social work spoke with her yesterday concerning outpt resources. Provided with script for PRN zyprexa as recommended by psychiatry.  Raeford Razor, MD 02/12/13 [phone]

## 2013-02-12 NOTE — Telephone Encounter (Signed)
i called his wife and left a message

## 2013-02-12 NOTE — Telephone Encounter (Signed)
i talked to her Wednesday and she told me things were improving. W

## 2013-02-12 NOTE — Telephone Encounter (Signed)
Returning Dr Riley Kill call.  Please call back

## 2013-02-12 NOTE — Telephone Encounter (Signed)
Called and left message.

## 2013-03-12 ENCOUNTER — Encounter: Payer: BC Managed Care – PPO | Admitting: Physical Medicine & Rehabilitation

## 2013-03-16 ENCOUNTER — Other Ambulatory Visit: Payer: Self-pay | Admitting: Physical Medicine & Rehabilitation

## 2013-04-16 ENCOUNTER — Encounter: Payer: BC Managed Care – PPO | Admitting: Physical Medicine & Rehabilitation

## 2013-05-07 ENCOUNTER — Encounter: Payer: BC Managed Care – PPO | Admitting: Physical Medicine & Rehabilitation

## 2013-05-25 ENCOUNTER — Encounter: Payer: BC Managed Care – PPO | Admitting: Physical Medicine & Rehabilitation

## 2013-10-28 ENCOUNTER — Other Ambulatory Visit: Payer: Self-pay

## 2014-03-23 DIAGNOSIS — S020XXA Fracture of vault of skull, initial encounter for closed fracture: Secondary | ICD-10-CM | POA: Diagnosis not present

## 2014-03-24 DIAGNOSIS — IMO0002 Reserved for concepts with insufficient information to code with codable children: Secondary | ICD-10-CM | POA: Diagnosis not present

## 2014-03-24 DIAGNOSIS — Z79899 Other long term (current) drug therapy: Secondary | ICD-10-CM | POA: Diagnosis not present

## 2014-08-26 DIAGNOSIS — I10 Essential (primary) hypertension: Secondary | ICD-10-CM | POA: Diagnosis not present

## 2014-08-26 DIAGNOSIS — I6200 Nontraumatic subdural hemorrhage, unspecified: Secondary | ICD-10-CM | POA: Diagnosis not present

## 2014-08-26 DIAGNOSIS — F0630 Mood disorder due to known physiological condition, unspecified: Secondary | ICD-10-CM | POA: Diagnosis not present

## 2014-08-26 DIAGNOSIS — F411 Generalized anxiety disorder: Secondary | ICD-10-CM | POA: Diagnosis not present

## 2014-08-26 DIAGNOSIS — G40309 Generalized idiopathic epilepsy and epileptic syndromes, not intractable, without status epilepticus: Secondary | ICD-10-CM | POA: Diagnosis not present

## 2014-08-27 DIAGNOSIS — F39 Unspecified mood [affective] disorder: Secondary | ICD-10-CM | POA: Diagnosis not present

## 2014-09-01 DIAGNOSIS — E441 Mild protein-calorie malnutrition: Secondary | ICD-10-CM | POA: Diagnosis not present

## 2014-09-01 DIAGNOSIS — G40309 Generalized idiopathic epilepsy and epileptic syndromes, not intractable, without status epilepticus: Secondary | ICD-10-CM | POA: Diagnosis not present

## 2014-09-01 DIAGNOSIS — D649 Anemia, unspecified: Secondary | ICD-10-CM | POA: Diagnosis not present

## 2014-09-01 DIAGNOSIS — I10 Essential (primary) hypertension: Secondary | ICD-10-CM | POA: Diagnosis not present

## 2014-09-05 DIAGNOSIS — D649 Anemia, unspecified: Secondary | ICD-10-CM | POA: Diagnosis not present

## 2014-09-06 DIAGNOSIS — G40309 Generalized idiopathic epilepsy and epileptic syndromes, not intractable, without status epilepticus: Secondary | ICD-10-CM | POA: Diagnosis not present

## 2014-09-06 DIAGNOSIS — I10 Essential (primary) hypertension: Secondary | ICD-10-CM | POA: Diagnosis not present

## 2014-09-06 DIAGNOSIS — D649 Anemia, unspecified: Secondary | ICD-10-CM | POA: Diagnosis not present

## 2014-09-19 DIAGNOSIS — D649 Anemia, unspecified: Secondary | ICD-10-CM | POA: Diagnosis not present

## 2014-09-20 DIAGNOSIS — F22 Delusional disorders: Secondary | ICD-10-CM | POA: Diagnosis not present

## 2014-09-20 DIAGNOSIS — D649 Anemia, unspecified: Secondary | ICD-10-CM | POA: Diagnosis not present

## 2014-09-20 DIAGNOSIS — I10 Essential (primary) hypertension: Secondary | ICD-10-CM | POA: Diagnosis not present

## 2014-11-02 DIAGNOSIS — D649 Anemia, unspecified: Secondary | ICD-10-CM | POA: Diagnosis not present

## 2014-11-03 DIAGNOSIS — D649 Anemia, unspecified: Secondary | ICD-10-CM | POA: Diagnosis not present

## 2014-11-03 DIAGNOSIS — G40309 Generalized idiopathic epilepsy and epileptic syndromes, not intractable, without status epilepticus: Secondary | ICD-10-CM | POA: Diagnosis not present

## 2014-11-03 DIAGNOSIS — F419 Anxiety disorder, unspecified: Secondary | ICD-10-CM | POA: Diagnosis not present

## 2014-11-03 DIAGNOSIS — I10 Essential (primary) hypertension: Secondary | ICD-10-CM | POA: Diagnosis not present

## 2014-11-30 DIAGNOSIS — F419 Anxiety disorder, unspecified: Secondary | ICD-10-CM | POA: Diagnosis not present

## 2014-11-30 DIAGNOSIS — G40309 Generalized idiopathic epilepsy and epileptic syndromes, not intractable, without status epilepticus: Secondary | ICD-10-CM | POA: Diagnosis not present

## 2014-11-30 DIAGNOSIS — I10 Essential (primary) hypertension: Secondary | ICD-10-CM | POA: Diagnosis not present

## 2014-12-05 DIAGNOSIS — D529 Folate deficiency anemia, unspecified: Secondary | ICD-10-CM | POA: Diagnosis not present

## 2015-01-03 DIAGNOSIS — F0390 Unspecified dementia without behavioral disturbance: Secondary | ICD-10-CM | POA: Diagnosis not present

## 2015-01-03 DIAGNOSIS — Z87820 Personal history of traumatic brain injury: Secondary | ICD-10-CM | POA: Diagnosis not present

## 2015-01-03 DIAGNOSIS — F39 Unspecified mood [affective] disorder: Secondary | ICD-10-CM | POA: Diagnosis not present

## 2015-01-21 DIAGNOSIS — E039 Hypothyroidism, unspecified: Secondary | ICD-10-CM | POA: Diagnosis not present

## 2015-03-14 DIAGNOSIS — K5289 Other specified noninfective gastroenteritis and colitis: Secondary | ICD-10-CM | POA: Diagnosis not present

## 2015-03-14 DIAGNOSIS — R400 Somnolence: Secondary | ICD-10-CM | POA: Diagnosis not present

## 2015-03-15 DIAGNOSIS — N390 Urinary tract infection, site not specified: Secondary | ICD-10-CM | POA: Diagnosis not present

## 2015-07-17 DIAGNOSIS — S020XXA Fracture of vault of skull, initial encounter for closed fracture: Secondary | ICD-10-CM | POA: Diagnosis not present

## 2015-08-11 DIAGNOSIS — F39 Unspecified mood [affective] disorder: Secondary | ICD-10-CM | POA: Diagnosis not present

## 2015-08-11 DIAGNOSIS — S062X9S Diffuse traumatic brain injury with loss of consciousness of unspecified duration, sequela: Secondary | ICD-10-CM | POA: Diagnosis not present

## 2015-08-14 DIAGNOSIS — F39 Unspecified mood [affective] disorder: Secondary | ICD-10-CM | POA: Diagnosis not present

## 2015-09-23 DIAGNOSIS — E559 Vitamin D deficiency, unspecified: Secondary | ICD-10-CM

## 2015-09-23 HISTORY — DX: Vitamin D deficiency, unspecified: E55.9

## 2015-10-06 DIAGNOSIS — Z1389 Encounter for screening for other disorder: Secondary | ICD-10-CM | POA: Diagnosis not present

## 2015-10-06 DIAGNOSIS — E559 Vitamin D deficiency, unspecified: Secondary | ICD-10-CM | POA: Diagnosis not present

## 2015-10-06 DIAGNOSIS — Z23 Encounter for immunization: Secondary | ICD-10-CM | POA: Diagnosis not present

## 2015-10-06 DIAGNOSIS — S069X0S Unspecified intracranial injury without loss of consciousness, sequela: Secondary | ICD-10-CM | POA: Diagnosis not present

## 2015-10-06 DIAGNOSIS — F419 Anxiety disorder, unspecified: Secondary | ICD-10-CM | POA: Diagnosis not present

## 2015-10-06 DIAGNOSIS — Z6825 Body mass index (BMI) 25.0-25.9, adult: Secondary | ICD-10-CM | POA: Diagnosis not present

## 2015-10-06 DIAGNOSIS — R404 Transient alteration of awareness: Secondary | ICD-10-CM | POA: Diagnosis not present

## 2015-10-06 DIAGNOSIS — R55 Syncope and collapse: Secondary | ICD-10-CM | POA: Diagnosis not present

## 2015-10-06 DIAGNOSIS — E785 Hyperlipidemia, unspecified: Secondary | ICD-10-CM | POA: Diagnosis not present

## 2015-10-11 DIAGNOSIS — T783XXA Angioneurotic edema, initial encounter: Secondary | ICD-10-CM | POA: Diagnosis not present

## 2015-10-11 DIAGNOSIS — Z6825 Body mass index (BMI) 25.0-25.9, adult: Secondary | ICD-10-CM | POA: Diagnosis not present

## 2015-10-11 DIAGNOSIS — R229 Localized swelling, mass and lump, unspecified: Secondary | ICD-10-CM | POA: Diagnosis not present

## 2015-10-24 DIAGNOSIS — S069X0S Unspecified intracranial injury without loss of consciousness, sequela: Secondary | ICD-10-CM | POA: Diagnosis not present

## 2015-10-24 DIAGNOSIS — D649 Anemia, unspecified: Secondary | ICD-10-CM | POA: Diagnosis not present

## 2015-10-24 DIAGNOSIS — E785 Hyperlipidemia, unspecified: Secondary | ICD-10-CM | POA: Diagnosis not present

## 2015-10-25 ENCOUNTER — Telehealth: Payer: Self-pay | Admitting: Gastroenterology

## 2015-10-26 NOTE — Telephone Encounter (Signed)
Left message on machine to call back regarding extender appt.

## 2015-10-29 ENCOUNTER — Encounter (HOSPITAL_COMMUNITY): Payer: Self-pay | Admitting: Emergency Medicine

## 2015-10-29 ENCOUNTER — Inpatient Hospital Stay (HOSPITAL_COMMUNITY)
Admission: EM | Admit: 2015-10-29 | Discharge: 2015-11-07 | DRG: 377 | Disposition: A | Discharge status: Home / Self Care | Payer: Medicare Other | Attending: Family Medicine | Admitting: Family Medicine

## 2015-10-29 DIAGNOSIS — F0391 Unspecified dementia with behavioral disturbance: Secondary | ICD-10-CM | POA: Diagnosis present

## 2015-10-29 DIAGNOSIS — S020XXA Fracture of vault of skull, initial encounter for closed fracture: Secondary | ICD-10-CM | POA: Diagnosis present

## 2015-10-29 DIAGNOSIS — R571 Hypovolemic shock: Secondary | ICD-10-CM | POA: Diagnosis not present

## 2015-10-29 DIAGNOSIS — K269 Duodenal ulcer, unspecified as acute or chronic, without hemorrhage or perforation: Secondary | ICD-10-CM | POA: Diagnosis not present

## 2015-10-29 DIAGNOSIS — E872 Acidosis, unspecified: Secondary | ICD-10-CM | POA: Insufficient documentation

## 2015-10-29 DIAGNOSIS — Z982 Presence of cerebrospinal fluid drainage device: Secondary | ICD-10-CM | POA: Diagnosis not present

## 2015-10-29 DIAGNOSIS — G9340 Encephalopathy, unspecified: Secondary | ICD-10-CM | POA: Diagnosis present

## 2015-10-29 DIAGNOSIS — S069X9A Unspecified intracranial injury with loss of consciousness of unspecified duration, initial encounter: Secondary | ICD-10-CM

## 2015-10-29 DIAGNOSIS — K920 Hematemesis: Secondary | ICD-10-CM | POA: Diagnosis not present

## 2015-10-29 DIAGNOSIS — K922 Gastrointestinal hemorrhage, unspecified: Secondary | ICD-10-CM | POA: Diagnosis not present

## 2015-10-29 DIAGNOSIS — K2960 Other gastritis without bleeding: Secondary | ICD-10-CM | POA: Diagnosis not present

## 2015-10-29 DIAGNOSIS — I9589 Other hypotension: Secondary | ICD-10-CM | POA: Diagnosis not present

## 2015-10-29 DIAGNOSIS — D62 Acute posthemorrhagic anemia: Secondary | ICD-10-CM | POA: Diagnosis present

## 2015-10-29 DIAGNOSIS — R4189 Other symptoms and signs involving cognitive functions and awareness: Secondary | ICD-10-CM | POA: Diagnosis present

## 2015-10-29 DIAGNOSIS — S069XAS Unspecified intracranial injury with loss of consciousness status unknown, sequela: Secondary | ICD-10-CM

## 2015-10-29 DIAGNOSIS — Z87820 Personal history of traumatic brain injury: Secondary | ICD-10-CM | POA: Diagnosis not present

## 2015-10-29 DIAGNOSIS — F6381 Intermittent explosive disorder: Secondary | ICD-10-CM | POA: Diagnosis not present

## 2015-10-29 DIAGNOSIS — K264 Chronic or unspecified duodenal ulcer with hemorrhage: Secondary | ICD-10-CM | POA: Diagnosis not present

## 2015-10-29 DIAGNOSIS — S069X0S Unspecified intracranial injury without loss of consciousness, sequela: Secondary | ICD-10-CM

## 2015-10-29 DIAGNOSIS — Z8249 Family history of ischemic heart disease and other diseases of the circulatory system: Secondary | ICD-10-CM | POA: Diagnosis not present

## 2015-10-29 DIAGNOSIS — K2970 Gastritis, unspecified, without bleeding: Secondary | ICD-10-CM | POA: Diagnosis present

## 2015-10-29 DIAGNOSIS — D649 Anemia, unspecified: Secondary | ICD-10-CM | POA: Diagnosis not present

## 2015-10-29 DIAGNOSIS — R55 Syncope and collapse: Secondary | ICD-10-CM | POA: Diagnosis not present

## 2015-10-29 DIAGNOSIS — E876 Hypokalemia: Secondary | ICD-10-CM | POA: Diagnosis not present

## 2015-10-29 DIAGNOSIS — K266 Chronic or unspecified duodenal ulcer with both hemorrhage and perforation: Secondary | ICD-10-CM | POA: Diagnosis not present

## 2015-10-29 DIAGNOSIS — K260 Acute duodenal ulcer with hemorrhage: Secondary | ICD-10-CM | POA: Diagnosis not present

## 2015-10-29 DIAGNOSIS — Z79899 Other long term (current) drug therapy: Secondary | ICD-10-CM

## 2015-10-29 DIAGNOSIS — R58 Hemorrhage, not elsewhere classified: Secondary | ICD-10-CM

## 2015-10-29 DIAGNOSIS — Z87891 Personal history of nicotine dependence: Secondary | ICD-10-CM | POA: Diagnosis not present

## 2015-10-29 DIAGNOSIS — S069XAA Unspecified intracranial injury with loss of consciousness status unknown, initial encounter: Secondary | ICD-10-CM | POA: Diagnosis present

## 2015-10-29 DIAGNOSIS — F068 Other specified mental disorders due to known physiological condition: Secondary | ICD-10-CM | POA: Diagnosis not present

## 2015-10-29 DIAGNOSIS — S069X9S Unspecified intracranial injury with loss of consciousness of unspecified duration, sequela: Secondary | ICD-10-CM

## 2015-10-29 DIAGNOSIS — R739 Hyperglycemia, unspecified: Secondary | ICD-10-CM | POA: Diagnosis present

## 2015-10-29 DIAGNOSIS — S020XXS Fracture of vault of skull, sequela: Secondary | ICD-10-CM

## 2015-10-29 DIAGNOSIS — T380X5A Adverse effect of glucocorticoids and synthetic analogues, initial encounter: Secondary | ICD-10-CM | POA: Diagnosis not present

## 2015-10-29 DIAGNOSIS — R339 Retention of urine, unspecified: Secondary | ICD-10-CM | POA: Diagnosis present

## 2015-10-29 DIAGNOSIS — R031 Nonspecific low blood-pressure reading: Secondary | ICD-10-CM | POA: Diagnosis not present

## 2015-10-29 DIAGNOSIS — I959 Hypotension, unspecified: Secondary | ICD-10-CM | POA: Diagnosis present

## 2015-10-29 DIAGNOSIS — D500 Iron deficiency anemia secondary to blood loss (chronic): Secondary | ICD-10-CM | POA: Diagnosis present

## 2015-10-29 HISTORY — DX: Gastrointestinal hemorrhage, unspecified: K92.2

## 2015-10-29 HISTORY — DX: Chronic or unspecified duodenal ulcer with hemorrhage: K26.4

## 2015-10-29 HISTORY — DX: Unspecified dementia, unspecified severity, with other behavioral disturbance: F03.918

## 2015-10-29 HISTORY — DX: Unspecified dementia with behavioral disturbance: F03.91

## 2015-10-29 LAB — CBC
HEMATOCRIT: 20.5 % — AB (ref 39.0–52.0)
HEMOGLOBIN: 6.4 g/dL — AB (ref 13.0–17.0)
MCH: 30.6 pg (ref 26.0–34.0)
MCHC: 31.2 g/dL (ref 30.0–36.0)
MCV: 98.1 fL (ref 78.0–100.0)
Platelets: 306 10*3/uL (ref 150–400)
RBC: 2.09 MIL/uL — ABNORMAL LOW (ref 4.22–5.81)
RDW: 14.6 % (ref 11.5–15.5)
WBC: 8.4 10*3/uL (ref 4.0–10.5)

## 2015-10-29 LAB — BASIC METABOLIC PANEL
ANION GAP: 5 (ref 5–15)
BUN: 20 mg/dL (ref 6–20)
CO2: 26 mmol/L (ref 22–32)
Calcium: 8.7 mg/dL — ABNORMAL LOW (ref 8.9–10.3)
Chloride: 107 mmol/L (ref 101–111)
Creatinine, Ser: 0.89 mg/dL (ref 0.61–1.24)
GFR calc Af Amer: >60 mL/min (ref >60)
GFR calc non Af Amer: >60 mL/min (ref >60)
GLUCOSE: 130 mg/dL — AB (ref 65–99)
POTASSIUM: 4.3 mmol/L (ref 3.5–5.1)
Sodium: 138 mmol/L (ref 135–145)

## 2015-10-29 LAB — HEPATIC FUNCTION PANEL
ALK PHOS: 44 U/L (ref 38–126)
ALT: 10 U/L — AB (ref 17–63)
AST: 12 U/L — ABNORMAL LOW (ref 15–41)
Albumin: 2.1 g/dL — ABNORMAL LOW (ref 3.5–5.0)
Total Bilirubin: 0.8 mg/dL (ref 0.3–1.2)
Total Protein: 4.4 g/dL — ABNORMAL LOW (ref 6.5–8.1)

## 2015-10-29 LAB — PREPARE RBC (CROSSMATCH)

## 2015-10-29 LAB — URINALYSIS, ROUTINE W REFLEX MICROSCOPIC
BILIRUBIN URINE: NEGATIVE
Glucose, UA: NEGATIVE mg/dL
Hgb urine dipstick: NEGATIVE
KETONES UR: NEGATIVE mg/dL
LEUKOCYTES UA: NEGATIVE
Nitrite: NEGATIVE
PROTEIN: NEGATIVE mg/dL
Specific Gravity, Urine: 1.02 (ref 1.005–1.030)
Urobilinogen, UA: 0.2 mg/dL (ref 0.0–1.0)
pH: 6 (ref 5.0–8.0)

## 2015-10-29 LAB — MRSA PCR SCREENING: MRSA by PCR: NEGATIVE

## 2015-10-29 LAB — POC OCCULT BLOOD, ED: Fecal Occult Bld: POSITIVE — AB

## 2015-10-29 LAB — CBG MONITORING, ED: GLUCOSE-CAPILLARY: 107 mg/dL — AB (ref 65–99)

## 2015-10-29 LAB — ABO/RH: ABO/RH(D): O NEG

## 2015-10-29 LAB — PROTIME-INR
INR: 1.25 (ref 0.00–1.49)
Prothrombin Time: 15.8 seconds — ABNORMAL HIGH (ref 11.6–15.2)

## 2015-10-29 LAB — HEMOGLOBIN: Hemoglobin: 7.2 g/dL — ABNORMAL LOW (ref 13.0–17.0)

## 2015-10-29 LAB — HEMATOCRIT: HCT: 22.3 % — ABNORMAL LOW (ref 39.0–52.0)

## 2015-10-29 MED ORDER — CARBAMAZEPINE 200 MG PO TABS
600.0000 mg | ORAL_TABLET | Freq: Two times a day (BID) | ORAL | Status: DC
  Administered 2015-10-29 – 2015-11-07 (×17): 600 mg via ORAL
  Filled 2015-10-29 (×23): qty 3

## 2015-10-29 MED ORDER — VITAMIN D3 25 MCG (1000 UNIT) PO TABS
1000.0000 [IU] | ORAL_TABLET | Freq: Every day | ORAL | Status: DC
  Administered 2015-10-30 – 2015-11-07 (×6): 1000 [IU] via ORAL
  Filled 2015-10-29 (×6): qty 1

## 2015-10-29 MED ORDER — ONDANSETRON HCL 4 MG/2ML IJ SOLN
INTRAMUSCULAR | Status: AC
  Filled 2015-10-29: qty 2

## 2015-10-29 MED ORDER — SODIUM CHLORIDE 0.9 % IV SOLN
INTRAVENOUS | Status: DC
  Administered 2015-10-29: 125 mL/h via INTRAVENOUS
  Administered 2015-10-30 – 2015-11-05 (×9): via INTRAVENOUS

## 2015-10-29 MED ORDER — ESCITALOPRAM OXALATE 20 MG PO TABS
20.0000 mg | ORAL_TABLET | Freq: Every day | ORAL | Status: DC
Start: 2015-10-30 — End: 2015-11-07
  Administered 2015-10-30 – 2015-11-07 (×8): 20 mg via ORAL
  Filled 2015-10-29 (×8): qty 1

## 2015-10-29 MED ORDER — SODIUM CHLORIDE 0.9 % IV BOLUS (SEPSIS)
1000.0000 mL | Freq: Once | INTRAVENOUS | Status: AC
  Administered 2015-10-29: 1000 mL via INTRAVENOUS

## 2015-10-29 MED ORDER — ONDANSETRON HCL 4 MG/2ML IJ SOLN
4.0000 mg | Freq: Four times a day (QID) | INTRAMUSCULAR | Status: DC | PRN
  Administered 2015-10-30 – 2015-11-01 (×4): 4 mg via INTRAVENOUS
  Filled 2015-10-29 (×3): qty 2

## 2015-10-29 MED ORDER — PANTOPRAZOLE SODIUM 40 MG IV SOLR
40.0000 mg | Freq: Two times a day (BID) | INTRAVENOUS | Status: DC
  Administered 2015-10-29 – 2015-10-30 (×3): 40 mg via INTRAVENOUS
  Filled 2015-10-29 (×3): qty 40

## 2015-10-29 MED ORDER — SODIUM CHLORIDE 0.9 % IV SOLN
10.0000 mL/h | Freq: Once | INTRAVENOUS | Status: AC
  Administered 2015-10-29: 10 mL/h via INTRAVENOUS

## 2015-10-29 MED ORDER — ACETAMINOPHEN 325 MG PO TABS: 650.0000 mg | ORAL_TABLET | Freq: Four times a day (QID) | ORAL | Status: DC | PRN

## 2015-10-29 MED ORDER — ONDANSETRON HCL 4 MG/2ML IJ SOLN
4.0000 mg | Freq: Once | INTRAMUSCULAR | Status: AC
  Administered 2015-10-29: 4 mg via INTRAVENOUS

## 2015-10-29 MED ORDER — ONDANSETRON HCL 4 MG PO TABS: 4.0000 mg | ORAL_TABLET | Freq: Four times a day (QID) | ORAL | Status: DC | PRN

## 2015-10-29 MED ORDER — ADULT MULTIVITAMIN W/MINERALS CH
1.0000 | ORAL_TABLET | Freq: Every day | ORAL | Status: DC
  Administered 2015-10-30 – 2015-11-07 (×6): 1 via ORAL
  Filled 2015-10-29 (×6): qty 1

## 2015-10-29 MED ORDER — CITALOPRAM HYDROBROMIDE 20 MG PO TABS: 20.0000 mg | ORAL_TABLET | Freq: Every day | ORAL | Status: DC

## 2015-10-29 MED ORDER — ACETAMINOPHEN 650 MG RE SUPP: 650.0000 mg | Freq: Four times a day (QID) | RECTAL | Status: DC | PRN

## 2015-10-29 NOTE — ED Notes (Signed)
Pt arrived via EMS with report of near syncopal episode while in church. Pt reported that he had gotten too hot and had some lightheadedness/dizziness and visual disturbances but denies falling, hitting head, LOC, n/v or headaches.

## 2015-10-29 NOTE — ED Notes (Signed)
Engineer, miningCalled Charge RN, ICU, spoke with [place].  She stated "will need to change the assignment, call Dr. Darnelle Catalanama."  Dr. Darnelle Catalanama paged.  Awaiting return call.

## 2015-10-29 NOTE — Consult Note (Signed)
Consultation  Referring Provider:     [facility] Primary Care Physician:  PERINI,[name], MD Primary Gastroenterologist:       [name] Dr Larae GroomsJacob's assigned for new patient visit Reason for Consultation:    Coffee ground emesis, melena, anemia             HPI:   Zachary Thornton is a 49 y.o. male  with a PMH of TBI presented to ER after a syncopal episode. He was noted to be anemic with Hgb 6. He had a episode of coffee ground emesis and also dark bowel movement was heme positive.  He has had a syncopal event few weeks ago and was referred by PMD for evaluation for recent onset anemia. Labs are unavailable at this time.No change in bowel frequency or change in color. Denies any nausea, vomiting, abdominal pain, melena or bright red blood per rectum. Per his family uses NSAID's very infrequently. No OTC or herbal medications.  No excessive alcohol intake.   Past Medical History  Diagnosis Date  . TBI (traumatic brain injury) (HCC)   . Back injury   . Dementia with behavioral disturbance     Past Surgical History  Procedure Laterality Date  . Csf shunt    . Craniotomy      Post TBI craniotomy and plate insertion    Family History  Problem Relation Age of Onset  . Heart disease Father     MVR  . Hypertension Mother      Social History  Substance Use Topics  . Smoking status: Former Games developermoker  . Smokeless tobacco: Never Used  . Alcohol Use: No    Prior to Admission medications   Medication Sig Start Date End Date Taking? Authorizing Provider  carbamazepine (TEGRETOL) 200 MG tablet Take 600 mg by mouth 2 (two) times daily.   Yes Historical Provider, MD  cholecalciferol (VITAMIN D) 1000 UNITS tablet Take 1,000 Units by mouth daily.   Yes Historical Provider, MD  escitalopram (LEXAPRO) 20 MG tablet Take 20 mg by mouth daily.   Yes Historical Provider, MD  fish oil-omega-3 fatty acids 1000 MG capsule Take 4 g by mouth 2 (two) times daily.    Yes Historical Provider, MD  Multiple  Vitamin (MULTIVITAMIN WITH MINERALS) TABS Take 1 tablet by mouth daily.   Yes Historical Provider, MD  propranolol (INDERAL) 60 MG tablet Take 120 mg by mouth 2 (two) times daily.   Yes Historical Provider, MD    Current Facility-Administered Medications  Medication Dose Route Frequency Provider Last Rate Last Dose  . pantoprazole (PROTONIX) injection 40 mg  40 mg Intravenous Q12H Maryruth Bunhristina P Rama, MD   40 mg at 10/29/15 1754    Allergies as of 10/29/2015  . (No Known Allergies)     Review of Systems:    This is positive for those things mentioned in the HPI All other review of systems are negative.       Physical Exam:  Vital signs in last 24 hours: Temp:  [97.7 F (36.5 C)-98.5 F (36.9 C)] 97.7 F (36.5 C) (11/06 1806) Pulse Rate:  [71-150] 81 (11/06 1825) Resp:  [11-24] 11 (11/06 1825) BP: (55-98)/(24-62) 88/56 mmHg (11/06 1825) SpO2:  [75 %-100 %] 100 % (11/06 1825)    General:  Well-developed, well-nourished and in no acute distress Eyes:  anicteric. ENT:   Mouth and posterior pharynx free of lesions.  Neck:   supple w/o thyromegaly or mass.  Lungs: Clear to auscultation bilaterally.  Heart:  S1S2, no rubs, murmurs, gallops. Abdomen:  soft, non-tender, no hepatosplenomegaly, hernia, or mass and BS+.  Lymph:  no cervical or supraclavicular adenopathy. Extremities:   no edema Skin   no rash. Neuro:  A&O x 3.  Psych:  appropriate mood and  Affect.   Data Reviewed:   LAB RESULTS:  Recent Labs  10/29/15 1316  WBC 8.4  HGB 6.4*  HCT 20.5*  PLT 306   BMET  Recent Labs  10/29/15 1316  NA 138  K 4.3  CL 107  CO2 26  GLUCOSE 130*  BUN 20  CREATININE 0.89  CALCIUM 8.7*      PREVIOUS ENDOSCOPIES:            None    Impression / Plan:   50 yr M with h/o TBI admitted with anemia after syncopal event. He had a a episode of coffee ground emesis and dark heme positive stool in the ER He received 2 U PRBC and 3L NS Currently hemodynamically  stable Likely etiology peptic ulcer disease. Will plan for EGD tomorrow for evaluation and possible intervention PPI IV twice daily Consider holding Propranolol Clear liquids and NPO after midnight Monitor Hgb and transfuse as needed to maintain Hgb >7 Please check PT/INR and LFT    K. Scherry Ran , MD [phone] Mon-Fri 8a-5p [phone] after 5p, weekends, holidays  @  10/29/2015, 6:41 PM

## 2015-10-29 NOTE — ED Notes (Signed)
Called Floor to give report, gave timer start.

## 2015-10-29 NOTE — H&P (Signed)
History and Physical:    Zachary Thornton   NFA:[id number] DOB: 10/06/1966 DOA: 10/29/2015  Referring MD/provider: Ebbie Ridge, PA-C PCP: [name], MD   Chief Complaint: "He passed out in church".  History of Present Illness:   Zachary Thornton is an 49 y.o. male with a PMH of TBI who had a syncopal episode a few weeks ago and a recurrent episode today.  The first episode occurred while he was sitting waiting for his mother in a waiting room.  He slumped over but his eyes remained open, and he briefly did not respond to voice.  EMS was called but the patient refused transport to the ED for further evaluation.  The patient's mother spoke with Dr. Waynard Edwards about this episode, and his dose of propranolol was reduced.  Another medication was prescribed to help him with his behavioral issues, but he had an allergic reaction to this and was put on prednisone.  The patient had a second syncopal episode while at church today.  He was unresponsive for less than 1 minutes.  EMS was called and the patient was brought to the ED for further evaluation where he was found to be hypotensive with a hemoglobin of 6.4.  There is no history of ASA/NSAID use, black or bloody stools, or abdominal pain.  The patient has lost weight recently, and has had a diminished appetite.     ROS:   Review of Systems  Constitutional: Positive for weight loss and malaise/fatigue. Negative for fever, chills and diaphoresis.  HENT: Negative for nosebleeds and sore throat.   Eyes: Negative.   Respiratory: Negative for cough, hemoptysis and shortness of breath.   Cardiovascular: Negative for chest pain and palpitations.  Gastrointestinal: Positive for nausea. Negative for heartburn, vomiting, abdominal pain, diarrhea, constipation, blood in stool and melena.       Has had 1 week of gagging but no actual vomiting  Genitourinary: Negative.   Musculoskeletal: Positive for falls.  Skin:       Recent angioedema from side effect of drug   Neurological: Positive for dizziness, loss of consciousness and weakness. Negative for headaches.  Endo/Heme/Allergies: Negative.   Psychiatric/Behavioral: The patient is nervous/anxious.        Behavioral disturbance     Past Medical History:   Past Medical History  Diagnosis Date  . TBI (traumatic brain injury) (HCC)   . Back injury   . Dementia with behavioral disturbance     Past Surgical History:   Past Surgical History  Procedure Laterality Date  . Csf shunt    . Craniotomy      Post TBI craniotomy and plate insertion    Social History:   Social History   Social History  . Marital Status: Married    Spouse Name: N/A  . Number of Children: 1  . Years of Education: N/A   Occupational History  . Disabled    Social History Main Topics  . Smoking status: Former Games developer  . Smokeless tobacco: Never Used  . Alcohol Use: No  . Drug Use: No  . Sexual Activity: Yes   Other Topics Concern  . Not on file   Social History Narrative   Lives with his mother.  Normally ambulates without assistance.      Family history:   Family History  Problem Relation Age of Onset  . Heart disease Father     MVR  . Hypertension Mother     Allergies   Review of patient's allergies indicates  no known allergies.  Current Medications:   Prior to Admission medications   Medication Sig Start Date End Date Taking? Authorizing Provider  amantadine (SYMMETREL) 100 MG capsule Take 100 mg by mouth daily.     Historical Provider, MD  atomoxetine (STRATTERA) 10 MG capsule Take 1 capsule (10 mg total) by mouth daily. 01/15/13   Ranelle OysterZachary T Swartz, MD  carbamazepine (TEGRETOL XR) 400 MG 12 hr tablet TAKE ONE TABLET BY MOUTH TWICE DAILY 01/15/13   Ranelle OysterZachary T Swartz, MD  carbamazepine (TEGRETOL XR) 400 MG 12 hr tablet TAKE ONE TABLET BY MOUTH TWICE DAILY 03/16/13   Ranelle OysterZachary T Swartz, MD  cholecalciferol (VITAMIN D) 1000 UNITS tablet Take 1,000 Units by mouth daily.    Historical Provider, MD    donepezil (ARICEPT) 10 MG tablet Take 1 tablet (10 mg total) by mouth at bedtime as needed. 01/15/13   Ranelle OysterZachary T Swartz, MD  fish oil-omega-3 fatty acids 1000 MG capsule Take 1 g by mouth daily.    Historical Provider, MD  Ginkgo Biloba 40 MG TABS Take 40 mg by mouth daily.     Historical Provider, MD  Multiple Vitamin (MULTIVITAMIN WITH MINERALS) TABS Take 1 tablet by mouth daily.    Historical Provider, MD  OLANZapine (ZYPREXA) 5 MG tablet Take 1 tablet (5 mg total) by mouth at bedtime as needed (agitation). 02/12/13   Raeford RazorStephen Kohut, MD  propranolol (INDERAL) 20 MG tablet Take 40 mg by mouth 3 (three) times daily. 01/29/13   Ranelle OysterZachary T Swartz, MD    Physical Exam:   Filed Vitals:   10/29/15 1700 10/29/15 1712 10/29/15 1715 10/29/15 1720  BP: 64/31 72/35 66/31  83/41  Pulse: 88 89    Temp:      TempSrc:      Resp: 19 14 15 14   SpO2: 76% 75%       Physical Exam: Blood pressure 83/41, pulse 89, temperature 98.5 F (36.9 C), temperature source Oral, resp. rate 14, SpO2 75 %. Gen: No acute distress. Pale. Head: Normocephalic, atraumatic. Eyes: PERRL, EOMI, sclerae nonicteric. Mouth: Oropharynx clear. Neck: Supple, no thyromegaly, no lymphadenopathy, no jugular venous distention. Chest: Lungs CTAB. CV: Heart sounds are regular.  No M/R/G. Abdomen: Soft, nontender, nondistended with normal active bowel sounds. Rectal: Deferred.  Done by EDP.  Hemoccult +. Extremities: Extremities are without C/E/C. Skin: Warm and dry. Neuro: Alert and oriented times 2; Non-focal. Psych: Mood and affect normal.   Data Review:    Labs: Basic Metabolic Panel:  Recent Labs Lab 10/29/15 1316  NA 138  K 4.3  CL 107  CO2 26  GLUCOSE 130*  BUN 20  CREATININE 0.89  CALCIUM 8.7*   Liver Function Tests: No results for input(s): AST, ALT, ALKPHOS, BILITOT, PROT, ALBUMIN in the last 168 hours. No results for input(s): LIPASE, AMYLASE in the last 168 hours. No results for input(s): AMMONIA in the  last 168 hours. CBC:  Recent Labs Lab 10/29/15 1316  WBC 8.4  HGB 6.4*  HCT 20.5*  MCV 98.1  PLT 306   Cardiac Enzymes: No results for input(s): CKTOTAL, CKMB, CKMBINDEX, TROPONINI in the last 168 hours.  BNP (last 3 results) No results for input(s): PROBNP in the last 8760 hours. CBG:  Recent Labs Lab 10/29/15 1237  GLUCAP 107*    Radiographic Studies: No results found.   EKG: Independently reviewed. Sinus rhythm at 77 bpm.; Prolonged PR interval; Abnormal R-wave progression, early transition.   Assessment/Plan:   Principal Problem:   Syncope  and collapse secondary to hypotension/Acute blood loss anemia from GIB - Admit to SDU. - Give 1 unit of PRBCs, fluid volume resuscitate, and hold propranolol.  Start PPI Q 12 hours. - Monitor H&H Q 6 hours.   - EDP to consult [facility].  Active Problems:   Traumatic brain injury with depressed frontal skull fracture (HCC)/Cognitive deficit as late effect of traumatic brain injury (HCC)/Episodic dyscontrol syndrome - Hold propranolol.   - Continue Tegretol and Lexapro.  DVT prophylaxis - SCDs.  Code Status / Family Communication / Disposition Plan:   Code Status: Full. Family Communication: Mother and sister at the bedside. Disposition Plan: Home when stable.  Attestation regarding necessity of inpatient status:   The appropriate admission status for this patient is INPATIENT. Inpatient status is judged to be reasonable and necessary in order to provide the required intensity of service to ensure the patient's safety. The patient's presenting symptoms, physical exam findings, and initial radiographic and laboratory data in the context of their chronic comorbidities is felt to place them at high risk for further clinical deterioration. Furthermore, it is not anticipated that the patient will be medically stable for discharge from the hospital within 2 midnights of admission. The following factors support the admission  status of inpatient.   -The patient's presenting symptoms include Syncope and collapse. - The worrisome physical exam findings include Pale skin. - The initial radiographic and laboratory data are worrisome because of severe anemia, hemoccult + stool. - The chronic co-morbidities include TBI. - Patient requires inpatient status due to high intensity of service, high risk for further deterioration and high frequency of surveillance required. - I certify that at the point of admission it is Zachary clinical judgment that the patient will require inpatient hospital care spanning beyond 2 midnights from the point of admission.   Time spent: 70 minutes.  Marchia Diguglielmo [facility] Pager [phone] Cell: [phone]   If 7PM-7AM, please contact night-coverage [email or web address] Password [facility] 10/29/2015, 5:28 PM

## 2015-10-29 NOTE — ED Notes (Signed)
Unable to draw labs on pt, pt is currently getting a blood transfusion

## 2015-10-29 NOTE — ED Provider Notes (Signed)
CSN: [id number]     Arrival date & time 10/29/15  1225 History   First MD Initiated Contact with Patient 10/29/15 1458     Chief Complaint  Patient presents with  . Near Syncope     (Consider location/radiation/quality/duration/timing/severity/associated sxs/prior Treatment) HPI Brandt LoosenScott Millhouse is a 49 yo male with history of traumatic brain injury who presents to the Emergency Department for evaluation of near syncopal episode at church earlier today. The pt is accompanied by sister. Sister reports that they were sitting during the service and pt states he was getting hot and started getting lightheaded and dizzy. Denies falling, hitting head, LOC, nausea, vomiting, headaches. Pt currently does not have any complaints. He denies chest pain, shortness of breath, palpitations, changes in vision, chills, hematuria, hematochezia/melena, constipation, diarrhea. Pt states he had a bowel of cereal for breakfast this morning. Sister states pt has had a low blood pressure since brain injury in 2012 and was recently told by PCP he was anemic. Sister also notes pt had a similar near syncopal episode 1 month ago, but they did not go to the ED and was never worked up for it.   Past Medical History  Diagnosis Date  . TBI (traumatic brain injury) (HCC)   . Back injury    Past Surgical History  Procedure Laterality Date  . Csf shunt    . Craniotomy      Post TBI craniotomy and plate insertion   Family History  Problem Relation Age of Onset  . Family history unknown: Yes   Social History  Substance Use Topics  . Smoking status: Former Games developermoker  . Smokeless tobacco: Never Used  . Alcohol Use: No    Review of Systems  All other systems negative except as documented in the HPI. All pertinent positives and negatives as reviewed in the HPI.  Allergies  Review of patient's allergies indicates no known allergies.  Home Medications   Prior to Admission medications   Medication Sig Start Date End Date  Taking? Authorizing Provider  amantadine (SYMMETREL) 100 MG capsule Take 100 mg by mouth daily.     Historical Provider, MD  atomoxetine (STRATTERA) 10 MG capsule Take 1 capsule (10 mg total) by mouth daily. 01/15/13   Ranelle OysterZachary T Swartz, MD  carbamazepine (TEGRETOL XR) 400 MG 12 hr tablet TAKE ONE TABLET BY MOUTH TWICE DAILY 01/15/13   Ranelle OysterZachary T Swartz, MD  carbamazepine (TEGRETOL XR) 400 MG 12 hr tablet TAKE ONE TABLET BY MOUTH TWICE DAILY 03/16/13   Ranelle OysterZachary T Swartz, MD  cholecalciferol (VITAMIN D) 1000 UNITS tablet Take 1,000 Units by mouth daily.    Historical Provider, MD  donepezil (ARICEPT) 10 MG tablet Take 1 tablet (10 mg total) by mouth at bedtime as needed. 01/15/13   Ranelle OysterZachary T Swartz, MD  fish oil-omega-3 fatty acids 1000 MG capsule Take 1 g by mouth daily.    Historical Provider, MD  Ginkgo Biloba 40 MG TABS Take 40 mg by mouth daily.     Historical Provider, MD  Multiple Vitamin (MULTIVITAMIN WITH MINERALS) TABS Take 1 tablet by mouth daily.    Historical Provider, MD  OLANZapine (ZYPREXA) 5 MG tablet Take 1 tablet (5 mg total) by mouth at bedtime as needed (agitation). 02/12/13   Raeford RazorStephen Kohut, MD  propranolol (INDERAL) 20 MG tablet Take 40 mg by mouth 3 (three) times daily. 01/29/13   Ranelle OysterZachary T Swartz, MD   BP 88/60 mmHg  Pulse 81  Temp(Src) 98.5 F (36.9 C) (Oral)  Resp 16  SpO2 98% Physical Exam  Constitutional: He is oriented to person, place, and time. He appears well-developed and well-nourished. No distress.  HENT:  Head: Normocephalic and atraumatic.  Eyes: Conjunctivae and EOM are normal. Pupils are equal, round, and reactive to light.  Neck: Normal range of motion. Neck supple.  Cardiovascular: Normal rate, regular rhythm and normal heart sounds.  Exam reveals no gallop and no friction rub.   No murmur heard. Pulmonary/Chest: Effort normal and breath sounds normal. No respiratory distress. He has no wheezes. He has no rales.  Abdominal: Soft. Bowel sounds are normal. He  exhibits no distension. There is no tenderness. There is no rebound and no guarding.  Genitourinary: Guaiac positive stool.  Musculoskeletal: Normal range of motion. He exhibits no edema.  Neurological: He is alert and oriented to person, place, and time.  Skin: Skin is warm and dry. No rash noted. He is not diaphoretic. There is pallor.  Psychiatric: He has a normal mood and affect. His behavior is normal.  Nursing note and vitals reviewed.   ED Course  Procedures (including critical care time) Labs Review Labs Reviewed  BASIC METABOLIC PANEL - Abnormal; Notable for the following:    Glucose, Bld 130 (*)    Calcium 8.7 (*)    All other components within normal limits  CBC - Abnormal; Notable for the following:    RBC 2.09 (*)    Hemoglobin 6.4 (*)    HCT 20.5 (*)    All other components within normal limits  CBG MONITORING, ED - Abnormal; Notable for the following:    Glucose-Capillary 107 (*)    All other components within normal limits  POC OCCULT BLOOD, ED - Abnormal; Notable for the following:    Fecal Occult Bld POSITIVE (*)    All other components within normal limits  URINALYSIS, ROUTINE W REFLEX MICROSCOPIC (NOT AT [facility])  CBG MONITORING, ED  TYPE AND SCREEN    Imaging Review No results found. I have personally reviewed and evaluated these images and lab results as part of my medical decision-making.  CRITICAL CARE Performed by: Carlyle Dolly Total critical care time: 60 minutes Critical care time was exclusive of separately billable procedures and treating other patients. Critical care was necessary to treat or prevent imminent or life-threatening deterioration. Critical care was time spent personally by me on the following activities: development of treatment plan with patient and/or surrogate as well as nursing, discussions with consultants, evaluation of patient's response to treatment, examination of patient, obtaining history from patient or surrogate,  ordering and performing treatments and interventions, ordering and review of laboratory studies, ordering and review of radiographic studies, pulse oximetry and re-evaluation of patient's condition.  I spoke with the [facility] and GI about the patient.  The patient then had a dark bowel movement that was large, along with dark vomitus.  I then called GI back to inform them that this would be a more emergent need for them to consult.  I have informed the family in great detail of the patient's condition and also advised the hospitalist of the change in condition.  Charlestine Night, PA-C 10/29/15 1838  Courteney Randall An, MD 10/29/15 2124

## 2015-10-29 NOTE — ED Notes (Signed)
Bed: [facility] Expected date: 10/29/15 Expected time: 12:28 PM Means of arrival: Ambulance Comments: Near syncope at church

## 2015-10-29 NOTE — ED Notes (Signed)
I was summoned by pt's. Sister, as pt. "didn't look good".  I enter the room to find pt. Pale, awake and slow of speech, but in no distress.  Dr. Charlotte SanesMcCuen is here and speaking with his family as I write this.

## 2015-10-29 NOTE — ED Notes (Signed)
MD at bedside. 

## 2015-10-30 ENCOUNTER — Encounter (HOSPITAL_COMMUNITY)
Admission: EM | Disposition: A | Discharge status: Home / Self Care | Payer: Self-pay | Source: Home / Self Care | Attending: Family Medicine

## 2015-10-30 ENCOUNTER — Inpatient Hospital Stay (HOSPITAL_COMMUNITY): Payer: Medicare Other | Admitting: Certified Registered Nurse Anesthetist

## 2015-10-30 ENCOUNTER — Encounter (HOSPITAL_COMMUNITY): Payer: Self-pay | Admitting: Certified Registered Nurse Anesthetist

## 2015-10-30 DIAGNOSIS — K260 Acute duodenal ulcer with hemorrhage: Secondary | ICD-10-CM

## 2015-10-30 DIAGNOSIS — R339 Retention of urine, unspecified: Secondary | ICD-10-CM | POA: Diagnosis present

## 2015-10-30 DIAGNOSIS — D649 Anemia, unspecified: Secondary | ICD-10-CM

## 2015-10-30 HISTORY — PX: ESOPHAGOGASTRODUODENOSCOPY (EGD) WITH PROPOFOL: SHX5813

## 2015-10-30 LAB — HEMATOCRIT
HCT: 19.2 % — ABNORMAL LOW (ref 39.0–52.0)
HCT: 24.6 % — ABNORMAL LOW (ref 39.0–52.0)
HCT: 29.8 % — ABNORMAL LOW (ref 39.0–52.0)
HEMATOCRIT: 20.4 % — AB (ref 39.0–52.0)

## 2015-10-30 LAB — HEMOGLOBIN
HEMOGLOBIN: 6.9 g/dL — AB (ref 13.0–17.0)
HEMOGLOBIN: 9.7 g/dL — AB (ref 13.0–17.0)
HEMOGLOBIN: 6.2 g/dL — AB (ref 13.0–17.0)
Hemoglobin: 8.2 g/dL — ABNORMAL LOW (ref 13.0–17.0)

## 2015-10-30 LAB — PREPARE RBC (CROSSMATCH)

## 2015-10-30 SURGERY — ESOPHAGOGASTRODUODENOSCOPY (EGD) WITH PROPOFOL
Anesthesia: Monitor Anesthesia Care

## 2015-10-30 SURGERY — ESOPHAGOGASTRODUODENOSCOPY (EGD) WITH PROPOFOL
Anesthesia: General

## 2015-10-30 MED ORDER — CETYLPYRIDINIUM CHLORIDE 0.05 % MT LIQD
7.0000 mL | Freq: Two times a day (BID) | OROMUCOSAL | Status: DC
  Administered 2015-10-30 – 2015-11-06 (×12): 7 mL via OROMUCOSAL

## 2015-10-30 MED ORDER — SODIUM CHLORIDE 0.9 % IV SOLN
Freq: Once | INTRAVENOUS | Status: AC
  Administered 2015-10-30: 04:00:00 via INTRAVENOUS

## 2015-10-30 MED ORDER — PROPOFOL 10 MG/ML IV BOLUS
INTRAVENOUS | Status: AC
  Filled 2015-10-30: qty 20

## 2015-10-30 MED ORDER — ONDANSETRON HCL 4 MG/2ML IJ SOLN
INTRAMUSCULAR | Status: AC
  Filled 2015-10-30: qty 2

## 2015-10-30 MED ORDER — LACTATED RINGERS IV SOLN
INTRAVENOUS | Status: DC | PRN
  Administered 2015-10-30: 15:00:00 via INTRAVENOUS

## 2015-10-30 MED ORDER — DEXAMETHASONE SODIUM PHOSPHATE 10 MG/ML IJ SOLN
INTRAMUSCULAR | Status: AC
  Filled 2015-10-30: qty 1

## 2015-10-30 MED ORDER — PANTOPRAZOLE SODIUM 40 MG PO TBEC
40.0000 mg | DELAYED_RELEASE_TABLET | Freq: Two times a day (BID) | ORAL | Status: DC
  Administered 2015-10-30: 40 mg via ORAL
  Filled 2015-10-30: qty 1

## 2015-10-30 MED ORDER — PROPOFOL 500 MG/50ML IV EMUL
INTRAVENOUS | Status: DC | PRN
  Administered 2015-10-30: 80 ug/kg/min via INTRAVENOUS

## 2015-10-30 MED ORDER — SODIUM CHLORIDE 0.9 % IV SOLN
Freq: Once | INTRAVENOUS | Status: AC
  Administered 2015-10-31: 04:00:00 via INTRAVENOUS

## 2015-10-30 MED ORDER — SODIUM CHLORIDE 0.9 % IJ SOLN
INTRAMUSCULAR | Status: AC
  Filled 2015-10-30: qty 10

## 2015-10-30 MED ORDER — PROPOFOL 500 MG/50ML IV EMUL
INTRAVENOUS | Status: DC | PRN
  Administered 2015-10-30: 30 mg via INTRAVENOUS

## 2015-10-30 MED ORDER — ACETAMINOPHEN 325 MG PO TABS
650.0000 mg | ORAL_TABLET | Freq: Once | ORAL | Status: AC
  Administered 2015-10-30: 650 mg via ORAL
  Filled 2015-10-30: qty 2

## 2015-10-30 MED ORDER — LIDOCAINE HCL (CARDIAC) 20 MG/ML IV SOLN
INTRAVENOUS | Status: AC
  Filled 2015-10-30: qty 15

## 2015-10-30 SURGICAL SUPPLY — 14 items

## 2015-10-30 NOTE — Progress Notes (Signed)
Progress Note   Dodd Schmid ZOX:[id number] DOB: 09-11-1966 DOA: 10/29/2015 PCP: [name], MD   Brief Narrative:   Zachary Thornton is an 49 y.o. male the PMH of TBI who was admitted after suffering from a syncopal episode at church with brief loss of consciousness. EMS was called and the patient was brought to the ED for further evaluation where he was found to be hypotensive with a hemoglobin of 6.4.Rectal exam revealed Hemoccult positive stools. While in the ED, the patient had hematemesis and a large black stool. GI subsequently was consulted.  Assessment/Plan:   Principal Problem:  Syncope and collapse secondary to hypotension/Acute blood loss anemia from GIB - Status post 4 units of blood, aggressive IV fluids with stabilization of blood pressure. - Monitor H&H Q 6 hours.  - Continue PPI. - GI consulted with plans for EGD.  Active Problems:   Urinary retention - I&O cath Q 6 hours PRN.   Traumatic brain injury with depressed frontal skull fracture (HCC)/Cognitive deficit as late effect of traumatic brain injury (HCC)/Episodic dyscontrol syndrome - Hold propranolol.  - Continue Tegretol and Lexapro.  DVT prophylaxis - SCDs.   Family Communication/Anticipated D/C date and plan/Code Status   Family Communication: Mother at bedside. Disposition Plan: Home when hemodynamically stable and hemoglobin stable 48 hours. Anticipated D/C date:   2-3 days. Code Status:     Code Status Orders        Start     Ordered   10/29/15 1856  Full code   Continuous     10/29/15 1855       IV Access:    Peripheral IV   Procedures and diagnostic studies:   No results found.   Medical Consultants:    Gastroenterology: [name], MD  Anti-Infectives:   Anti-infectives    None      Subjective:   Zachary Thornton was calm through the night.  No behavioral issues.  No stools or further hematemesis in the night.  Reports difficulty urinating, bladder  scanned by RN with 200 cc in bladder.  No dyspnea or dizziness/syncope.  Objective:    Filed Vitals:   10/30/15 0525 10/30/15 0547 10/30/15 0600 10/30/15 0610  BP: 105/62 105/67 95/58 97/57   Pulse: 70 66 68 66  Temp: 98.5 F (36.9 C) 98.7 F (37.1 C)  98.5 F (36.9 C)  TempSrc: Oral Oral  Oral  Resp: Height:      Weight:      SpO2: 100% 100% 100% 100%    Intake/Output Summary (Last 24 hours) at 10/30/15 0732 Last data filed at 10/30/15 0700  Gross per 24 hour  Intake 1867.08 ml  Output   1300 ml  Net 567.08 ml   Filed Weights   10/29/15 1858  Weight: 83.4 kg (183 lb 13.8 oz)    Exam: Gen:  NAD Cardiovascular:  RRR, No M/R/G Respiratory:  Lungs CTAB Gastrointestinal:  Abdomen soft, NT/ND, + BS Extremities:  No C/E/C   Data Reviewed:    Labs: Basic Metabolic Panel:  Recent Labs Lab 10/29/15 1316  NA 138  K 4.3  CL 107  CO2 26  GLUCOSE 130*  BUN 20  CREATININE 0.89  CALCIUM 8.7*   GFR Estimated Creatinine Clearance: 106.9 mL/min (by C-G formula based on Cr of 0.89). Liver Function Tests:  Recent Labs Lab 10/29/15 1930  AST 12*  ALT 10*  ALKPHOS 44  BILITOT 0.8  PROT 4.4*  ALBUMIN 2.1*  Coagulation profile  Recent Labs Lab 10/29/15 1930  INR 1.25    CBC:  Recent Labs Lab 10/29/15 1316 10/29/15 1930 10/30/15 0014  WBC 8.4  --   --   HGB 6.4* 7.2* 6.2*  HCT 20.5* 22.3* 19.2*  MCV 98.1  --   --   PLT 306  --   --    CBG:  Recent Labs Lab 10/29/15 1237  GLUCAP 107*    Microbiology Recent Results (from the past 240 hour(s))  MRSA PCR Screening     Status: None   Collection Time: 10/29/15  6:30 PM  Result Value Ref Range Status   MRSA by PCR NEGATIVE NEGATIVE Final    Comment:        The GeneXpert MRSA Assay (FDA approved for NASAL specimens only), is one component of a comprehensive MRSA colonization surveillance program. It is not intended to diagnose MRSA infection nor to guide or monitor  treatment for MRSA infections.      Medications:   . antiseptic oral rinse  7 mL Mouth Rinse BID  . carbamazepine  600 mg Oral BID  . cholecalciferol  1,000 Units Oral Daily  . escitalopram  20 mg Oral Daily  . multivitamin with minerals  1 tablet Oral Daily  . pantoprazole (PROTONIX) IV  40 mg Intravenous Q12H   Continuous Infusions: . sodium chloride 125 mL/hr at 10/30/15 0317    Time spent: 35 minutes.  The patient is medically unstable with high risk for clinical deterioration and requires high complexity decision making.    LOS: 1 day   Annaliza Zia  [facility] Pager [phone]. If unable to reach me by pager, please call my cell phone at [phone].  *Please refer to [email or web address], password [email or web address] to get updated schedule on who will round on this patient, as hospitalists switch teams weekly. If 7PM-7AM, please contact night-coverage at [email or web address], password [email or web address] for any overnight needs.  10/30/2015, 7:32 AM

## 2015-10-30 NOTE — Progress Notes (Signed)
CSW contacted by pt's, mother's lawyer. [name] , Joyice FasterKay Munger, is pt's legal guardian and is able to make all decisions on behalf of pt. Guardianship documentation is in pt's chart. NSG is aware HCPOA is not relevant since pt has a legal guardian.  Cori RazorJamie Hamish Banks LCSW [phone]

## 2015-10-30 NOTE — Anesthesia Postprocedure Evaluation (Signed)
Anesthesia Post Note  Patient: Brandt LoosenScott Lejeune  Procedure(s) Performed: Procedure(s) (LRB): ESOPHAGOGASTRODUODENOSCOPY (EGD) WITH PROPOFOL (N/A)  Anesthesia type: MAC  Patient location: PACU  Post pain: Pain level controlled  Post assessment: Patient's Cardiovascular Status Stable  Last Vitals:  Filed Vitals:   10/30/15 1510  BP: 93/54  Pulse: 76  Temp:   Resp: 12    Post vital signs: Reviewed and stable  Level of consciousness: sedated  Complications: No apparent anesthesia complications

## 2015-10-30 NOTE — Op Note (Signed)
[facility] [address] [place], [id number]   ENDOSCOPY PROCEDURE REPORT  PATIENT: [name], Ayaan  MR#: [id number] BIRTHDATE: [birth year] , 49  yrs. old GENDER: male ENDOSCOPIST: Rachael Feeaniel P Jacobs, MD PROCEDURE DATE:  10/30/2015 PROCEDURE:  EGD w/ biopsy ASA CLASS:     Class III INDICATIONS:  melena, syncope, anemia. MEDICATIONS: Monitored anesthesia care TOPICAL ANESTHETIC: none  DESCRIPTION OF PROCEDURE: After the risks benefits and alternatives of the procedure were thoroughly explained, informed consent was obtained.  The Pentax Gastroscope [id number] endoscope was introduced through the mouth and advanced to the second portion of the duodenum , Without limitations.  The instrument was slowly withdrawn as the mucosa was fully examined.    There was a large ulcer in just distal to the duodenal bulb, there appeared to be a blood clot adherant but due to it's location it was very difficult to fully visualize and impossible to remove the clot..  There was mucosal edema, friability surrounding the ulcer which created anatomic distortion, narrowing just post-bulbar. This did not appear neoplast however.  There was mild, non-specific distal gastritis.  The antrum and body were biopsied and sent to pathology.  The examination was otherwise normal.  Retroflexed views revealed no abnormalities.     The scope was then withdrawn from the patient and the procedure completed.  COMPLICATIONS: There were no immediate complications.  ENDOSCOPIC IMPRESSION: Post bulbar duodenal ulcer, see above  RECOMMENDATIONS: He will continue twice daily PPI for at least the next 6-8 weeks, then it is probably safe to decrease to once daily.  He should strictly avoid NSAID type medicines.  If the gastric biopsies show H.  pylori he will be started on appropriate antibiotics.  My office will contact him for out paitent visit in 6-8 weeks to recheck CBC, see how he is  recovering.  REPEAT EXAM:  eSigned:  Rachael Feeaniel P Jacobs, MD 10/30/2015 2:58 PM

## 2015-10-30 NOTE — Progress Notes (Signed)
     [facility] Progress Note  Subjective:   Hgb 6.2 at 12:14 am. 2 units prbcs ordered. (REc 2 last pm, 2 ordered thsi am, 1 done,1 running now). No complaints. No hematemesis, or melena through night. For EGD later today.  Objective:  Vital signs in last 24 hours: Temp:  [97.5 F (36.4 C)-98.7 F (37.1 C)] 98.5 F (36.9 C) (11/07 0610) Pulse Rate:  [66-150] 66 (11/07 0610) Resp:  [11-26] 15 (11/07 0610) BP: (55-128)/(24-87) 97/57 mmHg (11/07 0610) SpO2:  [75 %-100 %] 100 % (11/07 0610) Weight:  [183 lb 13.8 oz (83.4 kg)] 183 lb 13.8 oz (83.4 kg) (11/06 1858) Last BM Date: 10/29/15 General:   Alert,  Well-developed, in NAD being helped with urinal   Intake/Output from previous day: 11/06 0701 - 11/07 0700 In: 1867.1 [I.V.:1502.1; Blood:365] Out: 1300 [Urine:1300] Intake/Output this shift:    Lab Results:  Recent Labs  10/29/15 1316 10/29/15 1930 10/30/15 0014  WBC 8.4  --   --   HGB 6.4* 7.2* 6.2*  HCT 20.5* 22.3* 19.2*  PLT 306  --   --    BMET  Recent Labs  10/29/15 1316  NA 138  K 4.3  CL 107  CO2 26  GLUCOSE 130*  BUN 20  CREATININE 0.89  CALCIUM 8.7*   LFT  Recent Labs  10/29/15 1930  PROT 4.4*  ALBUMIN 2.1*  AST 12*  ALT 10*  ALKPHOS 44  BILITOT 0.8  BILIDIR <0.1*  IBILI NOT CALCULATED   PT/INR  Recent Labs  10/29/15 1930  LABPROT 15.8*  INR 1.25     ASSESSMENT/PLAN:  89 yr M with h/o TBI admitted with anemia after syncopal event. He had a a episode of coffee ground emesis and dark heme positive stool in the ER. Currently hemodynamically stable Likely etiology peptic ulcer disease. Will plan for EGD later today. Continue PPI. Trend Hgb.     LOS: 1 day   Tashay Bozich P PA-C 10/30/2015, Pager [phone] Mon-Fri 8a-5p [phone] after 5p, weekends, holidays

## 2015-10-30 NOTE — Care Management Note (Signed)
Case Management Note  Patient Details  Name: Zachary Thornton MRN: [id number] Date of Birth: 01/01/1966  Subjective/Objective:            Gi bleed with anemia        Action/Plan:Date: October 30, 2015 Chart reviewed for concurrent status and case management needs. Will continue to follow patient for changes and needs: Marcelle Smilinghonda Davis, RN, BSN, [place]   [phone]   Expected Discharge Date:                  Expected Discharge Plan:     In-House Referral:  NA  Discharge planning Services  CM Consult  Post Acute Care Choice:  NA Choice offered to:  NA  DME Arranged:    DME Agency:     HH Arranged:    HH Agency:     Status of Service:  In process, will continue to follow  Medicare Important Message Given:    Date Medicare IM Given:    Medicare IM give by:    Date Additional Medicare IM Given:    Additional Medicare Important Message give by:     If discussed at Long Length of Stay Meetings, dates discussed:    Additional Comments:  Golda AcreDavis, Rhonda Lynn, RN 10/30/2015, 10:10 AM

## 2015-10-30 NOTE — Transfer of Care (Signed)
Immediate Anesthesia Transfer of Care Note  Patient: Zachary LoosenScott Javed  Procedure(s) Performed: Procedure(s): ESOPHAGOGASTRODUODENOSCOPY (EGD) WITH PROPOFOL (N/A)  Patient Location: PACU  Anesthesia Type:MAC  Level of Consciousness:  sedated, patient cooperative and responds to stimulation  Airway & Oxygen Therapy:Patient Spontanous Breathing and Patient connected to face mask oxgen  Post-op Assessment:  Report given to PACU RN and Post -op Vital signs reviewed and stable  Post vital signs:  Reviewed and stable  Last Vitals:  Filed Vitals:   10/30/15 1354  BP: 122/67  Pulse: 83  Temp:   Resp: 19    Complications: No apparent anesthesia complications

## 2015-10-30 NOTE — Progress Notes (Signed)
CRITICAL VALUE ALERT  Critical value received:  Hgb 6.9  Date of notification:  10/30/2015  Time of notification: 2250  Critical value read back:Yes.    Nurse who received alert:  Starla Linkarolyn J Muhanad Torosyan, RN  MD notified (1st page):  Eula ListenK. Kirby-Graham, NP  Time of first page:  [phone]  MD notified (2nd page):  Time of second page:  Responding MD:  Eula ListenK. Kirby-Graham, NP  Time MD responded:  [phone]

## 2015-10-30 NOTE — Anesthesia Preprocedure Evaluation (Signed)
Anesthesia Evaluation  Patient identified by MRN, date of birth, ID band Patient awake    Reviewed: Allergy & Precautions, NPO status , Patient's Chart, lab work & pertinent test results  Airway Mallampati: I  TM Distance: >3 FB Neck ROM: Full    Dental   Pulmonary former smoker,    Pulmonary exam normal        Cardiovascular Normal cardiovascular exam     Neuro/Psych    GI/Hepatic   Endo/Other    Renal/GU      Musculoskeletal   Abdominal   Peds  Hematology   Anesthesia Other Findings   Reproductive/Obstetrics                             Anesthesia Physical Anesthesia Plan  ASA: III  Anesthesia Plan: General   Post-op Pain Management:    Induction: Intravenous  Airway Management Planned: Natural Airway  Additional Equipment:   Intra-op Plan:   Post-operative Plan:   Informed Consent: I have reviewed the patients History and Physical, chart, labs and discussed the procedure including the risks, benefits and alternatives for the proposed anesthesia with the patient or authorized representative who has indicated his/her understanding and acceptance.     Plan Discussed with: CRNA and Surgeon  Anesthesia Plan Comments:         Anesthesia Quick Evaluation

## 2015-10-30 NOTE — H&P (View-Only) (Signed)
Consultation  Referring Provider:     [facility] Primary Care Physician:  PERINI,[name], MD Primary Gastroenterologist:       [name] Dr Larae GroomsJacob's assigned for new patient visit Reason for Consultation:    Coffee ground emesis, melena, anemia             HPI:   Zachary LoosenScott Thornton is a 49 y.o. male  with a PMH of TBI presented to ER after a syncopal episode. He was noted to be anemic with Hgb 6. He had a episode of coffee ground emesis and also dark bowel movement was heme positive.  He has had a syncopal event few weeks ago and was referred by PMD for evaluation for recent onset anemia. Labs are unavailable at this time.No change in bowel frequency or change in color. Denies any nausea, vomiting, abdominal pain, melena or bright red blood per rectum. Per his family uses NSAID's very infrequently. No OTC or herbal medications.  No excessive alcohol intake.   Past Medical History  Diagnosis Date  . TBI (traumatic brain injury) (HCC)   . Back injury   . Dementia with behavioral disturbance     Past Surgical History  Procedure Laterality Date  . Csf shunt    . Craniotomy      Post TBI craniotomy and plate insertion    Family History  Problem Relation Age of Onset  . Heart disease Father     MVR  . Hypertension Mother      Social History  Substance Use Topics  . Smoking status: Former Games developermoker  . Smokeless tobacco: Never Used  . Alcohol Use: No    Prior to Admission medications   Medication Sig Start Date End Date Taking? Authorizing Provider  carbamazepine (TEGRETOL) 200 MG tablet Take 600 mg by mouth 2 (two) times daily.   Yes Historical Provider, MD  cholecalciferol (VITAMIN D) 1000 UNITS tablet Take 1,000 Units by mouth daily.   Yes Historical Provider, MD  escitalopram (LEXAPRO) 20 MG tablet Take 20 mg by mouth daily.   Yes Historical Provider, MD  fish oil-omega-3 fatty acids 1000 MG capsule Take 4 g by mouth 2 (two) times daily.    Yes Historical Provider, MD  Multiple  Vitamin (MULTIVITAMIN WITH MINERALS) TABS Take 1 tablet by mouth daily.   Yes Historical Provider, MD  propranolol (INDERAL) 60 MG tablet Take 120 mg by mouth 2 (two) times daily.   Yes Historical Provider, MD    Current Facility-Administered Medications  Medication Dose Route Frequency Provider Last Rate Last Dose  . pantoprazole (PROTONIX) injection 40 mg  40 mg Intravenous Q12H Maryruth Bunhristina P Rama, MD   40 mg at 10/29/15 1754    Allergies as of 10/29/2015  . (No Known Allergies)     Review of Systems:    This is positive for those things mentioned in the HPI All other review of systems are negative.       Physical Exam:  Vital signs in last 24 hours: Temp:  [97.7 F (36.5 C)-98.5 F (36.9 C)] 97.7 F (36.5 C) (11/06 1806) Pulse Rate:  [71-150] 81 (11/06 1825) Resp:  [11-24] 11 (11/06 1825) BP: (55-98)/(24-62) 88/56 mmHg (11/06 1825) SpO2:  [75 %-100 %] 100 % (11/06 1825)    General:  Well-developed, well-nourished and in no acute distress Eyes:  anicteric. ENT:   Mouth and posterior pharynx free of lesions.  Neck:   supple w/o thyromegaly or mass.  Lungs: Clear to auscultation bilaterally.  Heart:  S1S2, no rubs, murmurs, gallops. Abdomen:  soft, non-tender, no hepatosplenomegaly, hernia, or mass and BS+.  Lymph:  no cervical or supraclavicular adenopathy. Extremities:   no edema Skin   no rash. Neuro:  A&O x 3.  Psych:  appropriate mood and  Affect.   Data Reviewed:   LAB RESULTS:  Recent Labs  10/29/15 1316  WBC 8.4  HGB 6.4*  HCT 20.5*  PLT 306   BMET  Recent Labs  10/29/15 1316  NA 138  K 4.3  CL 107  CO2 26  GLUCOSE 130*  BUN 20  CREATININE 0.89  CALCIUM 8.7*      PREVIOUS ENDOSCOPIES:            None    Impression / Plan:   49 yr M with h/o TBI admitted with anemia after syncopal event. He had a a episode of coffee ground emesis and dark heme positive stool in the ER He received 2 U PRBC and 3L NS Currently hemodynamically  stable Likely etiology peptic ulcer disease. Will plan for EGD tomorrow for evaluation and possible intervention PPI IV twice daily Consider holding Propranolol Clear liquids and NPO after midnight Monitor Hgb and transfuse as needed to maintain Hgb >7 Please check PT/INR and LFT    K. Veena Nandigam , MD [phone] Mon-Fri 8a-5p [phone] after 5p, weekends, holidays  @  10/29/2015, 6:41 PM    

## 2015-10-30 NOTE — Progress Notes (Signed)
CRITICAL VALUE ALERT  Critical value received:  Hgb 6.2  Date of notification:  10/30/2015  Time of notification:  0037  Critical value read back:Yes.    Nurse who received alert:  Starla Linkarolyn J Khary Schaben, RN  MD notified (1st page):  L. Harduk, PA  Time of first page:  [phone]  MD notified (2nd page):  Time of second page:  Responding MD:  Wyn ForsterL. Harduk, PA  Time MD responded:  [phone]

## 2015-10-30 NOTE — Interval H&P Note (Signed)
History and Physical Interval Note:  10/30/2015 2:21 PM  Zachary LoosenScott Boyson  has presented today for surgery, with the diagnosis of coffee ground emesis  The various methods of treatment have been discussed with the patient and family. After consideration of risks, benefits and other options for treatment, the patient has consented to  Procedure(s): ESOPHAGOGASTRODUODENOSCOPY (EGD) WITH PROPOFOL (N/A) as a surgical intervention .  The patient's history has been reviewed, patient examined, no change in status, stable for surgery.  I have reviewed the patient's chart and labs.  Questions were answered to the patient's satisfaction.     Rachael FeeJacobs, Wednesday Ericsson P

## 2015-10-31 ENCOUNTER — Telehealth: Payer: Self-pay

## 2015-10-31 ENCOUNTER — Encounter (HOSPITAL_COMMUNITY): Payer: Self-pay | Admitting: Gastroenterology

## 2015-10-31 DIAGNOSIS — K269 Duodenal ulcer, unspecified as acute or chronic, without hemorrhage or perforation: Secondary | ICD-10-CM

## 2015-10-31 DIAGNOSIS — R339 Retention of urine, unspecified: Secondary | ICD-10-CM

## 2015-10-31 DIAGNOSIS — K264 Chronic or unspecified duodenal ulcer with hemorrhage: Secondary | ICD-10-CM | POA: Diagnosis present

## 2015-10-31 LAB — CBC
HCT: 24.4 % — ABNORMAL LOW (ref 39.0–52.0)
Hemoglobin: 8.2 g/dL — ABNORMAL LOW (ref 13.0–17.0)
MCH: 29.2 pg (ref 26.0–34.0)
MCHC: 33.6 g/dL (ref 30.0–36.0)
MCV: 86.8 fL (ref 78.0–100.0)
PLATELETS: 181 10*3/uL (ref 150–400)
RBC: 2.81 MIL/uL — AB (ref 4.22–5.81)
RDW: 17.9 % — ABNORMAL HIGH (ref 11.5–15.5)
WBC: 6.5 10*3/uL (ref 4.0–10.5)

## 2015-10-31 LAB — HEMOGLOBIN
HEMOGLOBIN: 8.3 g/dL — AB (ref 13.0–17.0)
HEMOGLOBIN: 8.1 g/dL — AB (ref 13.0–17.0)
Hemoglobin: 6.4 g/dL — CL (ref 13.0–17.0)

## 2015-10-31 LAB — HEMATOCRIT
HCT: 24.7 % — ABNORMAL LOW (ref 39.0–52.0)
HEMATOCRIT: 24.1 % — AB (ref 39.0–52.0)
HEMATOCRIT: 19.1 % — AB (ref 39.0–52.0)

## 2015-10-31 LAB — PREPARE RBC (CROSSMATCH)

## 2015-10-31 MED ORDER — MEPERIDINE HCL 50 MG/ML IJ SOLN: 6.2500 mg | INTRAMUSCULAR | Status: DC | PRN

## 2015-10-31 MED ORDER — SODIUM CHLORIDE 0.9 % IV SOLN
Freq: Once | INTRAVENOUS | Status: AC
  Administered 2015-10-31: 23:00:00 via INTRAVENOUS

## 2015-10-31 MED ORDER — PANTOPRAZOLE SODIUM 40 MG IV SOLR: 40.0000 mg | Freq: Two times a day (BID) | INTRAVENOUS | Status: DC

## 2015-10-31 MED ORDER — SODIUM CHLORIDE 0.9 % IV SOLN
8.0000 mg/h | INTRAVENOUS | Status: DC
  Administered 2015-10-31 – 2015-11-01 (×4): 8 mg/h via INTRAVENOUS
  Filled 2015-10-31 (×7): qty 80

## 2015-10-31 MED ORDER — ONDANSETRON HCL 4 MG/2ML IJ SOLN: 4.0000 mg | Freq: Once | INTRAMUSCULAR | Status: DC | PRN

## 2015-10-31 MED ORDER — HYDROMORPHONE HCL 1 MG/ML IJ SOLN: 0.2500 mg | INTRAMUSCULAR | Status: DC | PRN

## 2015-10-31 NOTE — Telephone Encounter (Signed)
Letter mailed to the pt with appt and labs due.

## 2015-10-31 NOTE — Telephone Encounter (Signed)
Pt mailed appt and notified to have labs prior.

## 2015-10-31 NOTE — Progress Notes (Signed)
      Progress Note   Subjective  Last 24 hours - patient had EGD yesterday showing large duodenal ulcer with adherent clot. He was noted to have drop in H/H overnight and had a transfusion. He had a BM overnight - per nursing concerning for melena however patient reported it was normal. He was noted to be eating breakfast this AM upon evaluation. He denies abdominal pain.    Objective   Vital signs in last 24 hours: Temp:  [97.8 F (36.6 C)-98.9 F (37.2 C)] 98.8 F (37.1 C) (11/08 0800) Pulse Rate:  [74-95] 77 (11/08 0743) Resp:  [11-19] 11 (11/08 0743) BP: (84-137)/(52-83) 118/70 mmHg (11/08 0743) SpO2:  [93 %-100 %] 93 % (11/08 0743) Weight:  [185 lb 13.6 oz (84.3 kg)] 185 lb 13.6 oz (84.3 kg) (11/08 0354) Last BM Date: 10/30/15 General:    white male in NAD Heart:  Regular rate and rhythm; no murmurs Lungs: Respirations even and unlabored, lungs CTA bilaterally Abdomen:  Soft, nontender and nondistended. Normal bowel sounds. Extremities:  Without edema. Neurologic:  Alert and oriented,  grossly normal neurologically. Psych:  Cooperative. Normal mood and affect.  Intake/Output from previous day: 11/07 0701 - 11/08 0700 In: 2802.3 [P.O.:240; I.V.:1961.3; Blood:601] Out: 2675 [Urine:2675] Intake/Output this shift:    Lab Results:  Recent Labs  10/29/15 1316  10/30/15 1622 10/30/15 2235 10/31/15 0720  WBC 8.4  --   --   --   --   HGB 6.4*  < > 8.2* 6.9* 8.3*  HCT 20.5*  < > 24.6* 20.4* 24.7*  PLT 306  --   --   --   --   < > = values in this interval not displayed. BMET  Recent Labs  10/29/15 1316  NA 138  K 4.3  CL 107  CO2 26  GLUCOSE 130*  BUN 20  CREATININE 0.89  CALCIUM 8.7*   LFT  Recent Labs  10/29/15 1930  PROT 4.4*  ALBUMIN 2.1*  AST 12*  ALT 10*  ALKPHOS 44  BILITOT 0.8  BILIDIR <0.1*  IBILI NOT CALCULATED   PT/INR  Recent Labs  10/29/15 1930  LABPROT 15.8*  INR 1.25    Studies/Results: No results found.     Assessment / Plan:   49 y/o male admitted with UGI bleed. Endoscopy yesterday showed large duodenal ulcer with adherent clot. No endoscopic intervention was performed. Overnight he had a drop in H/H, and ? dark bowel movement. He had a PRBC transfusion overnight. No further bowel movements endorsed today.  At this time, would continue to monitor serial CBC, repeat in 6 hours to check and ensure stable. Unclear if prior drop in Hgb is due to hemodilution from IVF or active bleeding. He is hemodynamically stable. I would recommend keeping him NPO at this time and resume IV protonix, and monitor CBC. If he has evidence of active GI bleeding he may need another endoscopy to re-evaluate the ulcer, thus please keep him NPO in this light. We will continue to follow.    Diagnosis: Duodenal ulcer Upper GI bleed Anemia  Ileene PatrickSteven Chauncey Bruno, MD [facility] Pager [phone]     LOS: 2 days

## 2015-10-31 NOTE — Plan of Care (Signed)
Problem: Safety: Goal: Ability to remain free from injury will improve Outcome: Progressing Falls prevention protocol maintained. Reviewed with patient and reinforced the importance of adherence to safety measures to reduce likelihood for falls or fall-related injuries, such as appropriate use of the call bell,  maintaining the bed in low and locked position, keeping needed items within easy access and consistent wearing of non-skid footwear during ambulation attempts. Patient verbalized understanding and demonstrated compliance.         

## 2015-10-31 NOTE — Progress Notes (Addendum)
Progress Note   Zachary Thornton DOB: 05/21/1966 DOA: 10/29/2015 PCP: Ezequiel KayserPERINI,[name], MD   Brief Narrative:   Zachary LoosenScott Thornton is an 49 y.o. male the PMH of TBI who was admitted after suffering from a syncopal episode at church with brief loss of consciousness. EMS was called and the patient was brought to the ED for further evaluation where he was found to be hypotensive with a hemoglobin of 6.4.Rectal exam revealed Hemoccult positive stools. While in the ED, the patient had hematemesis and a large black stool. GI subsequently was consulted.  Assessment/Plan:   Principal Problem:  Syncope and collapse secondary to duodenal ulcer hemorrhage with hypotension/Acute blood loss anemia  - Status post 5 units of blood, aggressive IV fluids with stabilization of blood pressure. - Monitoring H&H Q 6 hours.  - EGD done by GI 10/30/15. Large ulcer found distal to the duodenal bulb with an adherent clot. Follow-up biopsy/pathology. - Continue PPI. - Transfer to floor.  Mobilize.  Active Problems:   Urinary retention - Continues to have difficulty voiding. Continue I&O cath Q 6 hours PRN. - Mobilize.   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  - Continue Tegretol and Lexapro.  DVT prophylaxis - SCDs.   Family Communication/Anticipated D/C date and plan/Code Status   Family Communication: Mother updated by telephone 10/31/15. Disposition Plan: Home when hemodynamically stable and hemoglobin stable 48 hours. Anticipated D/C date:   1-2 days. Needs to F/U with Dr. Christella HartiganJacobs in 6-8 weeks with CBC a few days prior to visit. Code Status:     Code Status Orders        Start     Ordered   10/29/15 1856  Full code   Continuous     10/29/15 1855       IV Access:    Peripheral IV   Procedures and diagnostic studies:    EGD 10/30/15  ENDOSCOPIC IMPRESSION: Post bulbar duodenal ulcer.  RECOMMENDATIONS: He will continue twice daily PPI for at least the next 6-8 weeks, then it is probably safe to decrease to once daily. He should strictly avoid NSAID type medicines. If the gastric biopsies show H. pylori he will be started on appropriate antibiotics. My office will contact him for out paitent visit in 6-8 weeks to recheck CBC, see how he is recovering.   Medical Consultants:    Gastroenterology: Napoleon FormKavitha Nandigam V, MD  Anti-Infectives:   Anti-infectives    None      Subjective:   Zachary Thornton remains calm and without behavioral issues.  No stools or further hematemesis.  Still having difficulty urinating.  No dyspnea or dizziness/syncope.  Objective:    Filed Vitals:   10/31/15 0354 10/31/15 0400 10/31/15 0512 10/31/15 0527  BP:  119/83 113/69 137/81  Pulse:  82 80 95  Temp:  98.4 F (36.9 C) 97.8 F (36.6 C)   TempSrc:  Oral Oral   Resp:  12 11 19   Height: 5\' 9"  (1.753 m)     Weight: 84.3 kg (185 lb 13.6 oz)     SpO2:  97% 95% 100%    Intake/Output Summary (Last 24 hours) at 10/31/15 0720 Last data filed at 10/31/15 0650  Gross per 24 hour  Intake 2802.33 ml  Output   2675 ml  Net 127.33 ml   Filed Weights   10/29/15 1858 10/31/15 0354  Weight: 83.4  kg (183 lb 13.8 oz) 84.3 kg (185 lb 13.6 oz)    Exam: Gen:  NAD, color improved Cardiovascular:  RRR, No M/R/G Respiratory:  Lungs CTAB Gastrointestinal:  Abdomen soft, NT/ND, + BS Extremities:  No C/E/C   Data Reviewed:    Labs: Basic Metabolic Panel:  Recent Labs Lab 10/29/15 1316  NA 138  K 4.3  CL 107  CO2 26  GLUCOSE 130*  BUN 20  CREATININE 0.89  CALCIUM 8.7*   GFR Estimated Creatinine Clearance: 100.4 mL/min (by C-G formula based on Cr of 0.89). Liver Function Tests:  Recent Labs Lab 10/29/15 1930  AST 12*  ALT 10*  ALKPHOS 44  BILITOT 0.8  PROT 4.4*  ALBUMIN 2.1*   Coagulation  profile  Recent Labs Lab 10/29/15 1930  INR 1.25    CBC:  Recent Labs Lab 10/29/15 1316 10/29/15 1930 10/30/15 0014 10/30/15 1014 10/30/15 1622 10/30/15 2235  WBC 8.4  --   --   --   --   --   HGB 6.4* 7.2* 6.2* 9.7* 8.2* 6.9*  HCT 20.5* 22.3* 19.2* 29.8* 24.6* 20.4*  MCV 98.1  --   --   --   --   --   PLT 306  --   --   --   --   --    CBG:  Recent Labs Lab 10/29/15 1237  GLUCAP 107*    Microbiology Recent Results (from the past 240 hour(s))  MRSA PCR Screening     Status: None   Collection Time: 10/29/15  6:30 PM  Result Value Ref Range Status   MRSA by PCR NEGATIVE NEGATIVE Final    Comment:        The GeneXpert MRSA Assay (FDA approved for NASAL specimens only), is one component of a comprehensive MRSA colonization surveillance program. It is not intended to diagnose MRSA infection nor to guide or monitor treatment for MRSA infections.      Medications:   . antiseptic oral rinse  7 mL Mouth Rinse BID  . carbamazepine  600 mg Oral BID  . cholecalciferol  1,000 Units Oral Daily  . escitalopram  20 mg Oral Daily  . multivitamin with minerals  1 tablet Oral Daily  . pantoprazole  40 mg Oral BID AC   Continuous Infusions: . sodium chloride 75 mL/hr at 10/31/15 0600    Time spent: 25 minutes.    LOS: 2 days   Bently Morath  [facility] Pager [phone]. If unable to reach me by pager, please call my cell phone at [phone].  *Please refer to [email or web address], password [email or web address] to get updated schedule on who will round on this patient, as hospitalists switch teams weekly. If 7PM-7AM, please contact night-coverage at [email or web address], password [email or web address] for any overnight needs.  10/31/2015, 7:20 AM

## 2015-10-31 NOTE — Telephone Encounter (Signed)
-----   Message from Rachael Feeaniel P Jacobs, MD sent at 10/30/2015  3:00 PM EST ----- He needs rov in 6-8 weeks, cbc a few days prior. Hospital follow up, duodenal ulcer.  tahnks

## 2015-10-31 NOTE — Progress Notes (Signed)
Gave report to Selena BattenKim, Charity fundraiserN. Family at bedside and notified of room change. VSS. All belongings sent with patient.

## 2015-10-31 NOTE — Progress Notes (Addendum)
Serial Hemoglobin and Hematocrit measurements revealed significant variability between the last two results, requiring transfusion of one unit of packed red blood cells, per physician's directive. There was evidence of comparative reduction of Hemoglobin and Hematocrit values, from 8.2 g./dL and 29.5%24.6%, respectively, to 6.9 g/dL and 62.1%20.4%. Blood transfusion was started at 03:31 and completed at 05:12, without ensuing complications. Vital signs remained stable throughout the transfusion process. Patient showed no signs or symptoms of transfusion reaction, such as SOB, fever, chills, back or flank pain, hematuria, urticarial lesions or cutaneous skin changes. Post-transfusion re-draw of Hemoglobin and Hematocrit levels is scheduled to be taken at 07:00. This past shift, patient was unable to void spontaneously on his own, citing difficulty in initiating urinary stream. Patient stated he had no abrupt urge or sensation to void and declined being assisted to the bathroom. Patient denied any sensation of bladder pain or pressure. Lower abdominal examination revealed a soft and nontender suprapubic region, without evidence of distension or guarding. Bladder scan performed at 05:42 indicated a pre-void urinary volume of 358 ml. At 05:45, patient was offered an 8-oz. cup of water, which he consumed 100%. Afterwards, patient was offered toileting assistance on multiple occasions but remained steadfastly firm in his refusal, stating he re-attempt to empty his bladder at a later time when he feels the urge to urinate. Straight catheterization was performed by this writer at 06:45, with immediate return of 400 ml of clear, yellow urine. Post-void bladder scan at 06:50 revealed a residual urine volume of 0 ml.

## 2015-10-31 NOTE — Progress Notes (Signed)
CRITICAL VALUE ALERT  Critical value received:  hgb 6.4  Date of notification:  10/31/2015  Time of notification:  2050  Critical value read back:Yes.    Nurse who received alert:  mk  MD notified (1st page):  Maren ReamerKaren kirby, NP  Time of first page:  2100  MD notified (2nd page):  Time of second page:  Responding MD:  Maren ReamerKaren kirby  Time MD responded:  2100

## 2015-11-01 ENCOUNTER — Encounter (HOSPITAL_COMMUNITY): Payer: Self-pay

## 2015-11-01 ENCOUNTER — Inpatient Hospital Stay (HOSPITAL_COMMUNITY): Payer: Medicare Other

## 2015-11-01 ENCOUNTER — Encounter (HOSPITAL_COMMUNITY)
Admission: EM | Disposition: A | Discharge status: Home / Self Care | Payer: Self-pay | Source: Home / Self Care | Attending: Family Medicine

## 2015-11-01 DIAGNOSIS — E876 Hypokalemia: Secondary | ICD-10-CM | POA: Insufficient documentation

## 2015-11-01 DIAGNOSIS — S069X0S Unspecified intracranial injury without loss of consciousness, sequela: Secondary | ICD-10-CM

## 2015-11-01 DIAGNOSIS — K264 Chronic or unspecified duodenal ulcer with hemorrhage: Principal | ICD-10-CM

## 2015-11-01 DIAGNOSIS — E872 Acidosis, unspecified: Secondary | ICD-10-CM | POA: Insufficient documentation

## 2015-11-01 DIAGNOSIS — I9589 Other hypotension: Secondary | ICD-10-CM

## 2015-11-01 DIAGNOSIS — F068 Other specified mental disorders due to known physiological condition: Secondary | ICD-10-CM

## 2015-11-01 DIAGNOSIS — D62 Acute posthemorrhagic anemia: Secondary | ICD-10-CM

## 2015-11-01 HISTORY — PX: ESOPHAGOGASTRODUODENOSCOPY: SHX5428

## 2015-11-01 LAB — CBC
HCT: 27.6 % — ABNORMAL LOW (ref 39.0–52.0)
HEMOGLOBIN: 9.3 g/dL — AB (ref 13.0–17.0)
MCH: 29.7 pg (ref 26.0–34.0)
MCHC: 33.7 g/dL (ref 30.0–36.0)
MCV: 88.2 fL (ref 78.0–100.0)
Platelets: 141 10*3/uL — ABNORMAL LOW (ref 150–400)
RBC: 3.13 MIL/uL — ABNORMAL LOW (ref 4.22–5.81)
RDW: 14.8 % (ref 11.5–15.5)
WBC: 11.6 10*3/uL — AB (ref 4.0–10.5)

## 2015-11-01 LAB — CBC WITH DIFFERENTIAL/PLATELET
BASOS PCT: 0 %
Basophils Absolute: 0 10*3/uL (ref 0.0–0.1)
EOS PCT: 1 %
Eosinophils Absolute: 0.1 10*3/uL (ref 0.0–0.7)
HEMATOCRIT: 19.8 % — AB (ref 39.0–52.0)
Hemoglobin: 6.6 g/dL — CL (ref 13.0–17.0)
LYMPHS PCT: 12 %
Lymphs Abs: 1.3 10*3/uL (ref 0.7–4.0)
MCH: 30.1 pg (ref 26.0–34.0)
MCHC: 33.3 g/dL (ref 30.0–36.0)
MCV: 90.4 fL (ref 78.0–100.0)
MONOS PCT: 6 %
Monocytes Absolute: 0.7 10*3/uL (ref 0.1–1.0)
NEUTROS PCT: 81 %
Neutro Abs: 9.1 10*3/uL — ABNORMAL HIGH (ref 1.7–7.7)
Platelets: 165 10*3/uL (ref 150–400)
RBC: 2.19 MIL/uL — AB (ref 4.22–5.81)
RDW: 16.6 % — AB (ref 11.5–15.5)
WBC: 11.2 10*3/uL — AB (ref 4.0–10.5)

## 2015-11-01 LAB — COMPREHENSIVE METABOLIC PANEL
ALBUMIN: 1.6 g/dL — AB (ref 3.5–5.0)
ALT: 9 U/L — ABNORMAL LOW (ref 17–63)
ANION GAP: 4 — AB (ref 5–15)
AST: 15 U/L (ref 15–41)
Alkaline Phosphatase: 34 U/L — ABNORMAL LOW (ref 38–126)
BILIRUBIN TOTAL: 0.4 mg/dL (ref 0.3–1.2)
BUN: 19 mg/dL (ref 6–20)
CHLORIDE: 117 mmol/L — AB (ref 101–111)
CO2: 20 mmol/L — AB (ref 22–32)
Calcium: 6.6 mg/dL — ABNORMAL LOW (ref 8.9–10.3)
Creatinine, Ser: 0.93 mg/dL (ref 0.61–1.24)
GFR calc Af Amer: >60 mL/min (ref >60)
GFR calc non Af Amer: >60 mL/min (ref >60)
GLUCOSE: 144 mg/dL — AB (ref 65–99)
POTASSIUM: 3.3 mmol/L — AB (ref 3.5–5.1)
SODIUM: 141 mmol/L (ref 135–145)
Total Protein: 3.1 g/dL — ABNORMAL LOW (ref 6.5–8.1)

## 2015-11-01 LAB — BASIC METABOLIC PANEL
ANION GAP: 3 — AB (ref 5–15)
BUN: 18 mg/dL (ref 6–20)
CALCIUM: 6.6 mg/dL — AB (ref 8.9–10.3)
CO2: 20 mmol/L — ABNORMAL LOW (ref 22–32)
Chloride: 118 mmol/L — ABNORMAL HIGH (ref 101–111)
Creatinine, Ser: 0.88 mg/dL (ref 0.61–1.24)
GFR calc Af Amer: >60 mL/min (ref >60)
GLUCOSE: 165 mg/dL — AB (ref 65–99)
Potassium: 3.9 mmol/L (ref 3.5–5.1)
SODIUM: 141 mmol/L (ref 135–145)

## 2015-11-01 LAB — PROTIME-INR
INR: 1.32 (ref 0.00–1.49)
PROTHROMBIN TIME: 16.5 s — AB (ref 11.6–15.2)

## 2015-11-01 LAB — APTT: APTT: 28 s (ref 24–37)

## 2015-11-01 LAB — HEMOGLOBIN
HEMOGLOBIN: 6.6 g/dL — AB (ref 13.0–17.0)
HEMOGLOBIN: 6.8 g/dL — AB (ref 13.0–17.0)

## 2015-11-01 LAB — GLUCOSE, CAPILLARY: Glucose-Capillary: 137 mg/dL — ABNORMAL HIGH (ref 65–99)

## 2015-11-01 LAB — LACTIC ACID, PLASMA
Lactic Acid, Venous: 2.8 mmol/L (ref 0.5–2.0)
Lactic Acid, Venous: 2.2 mmol/L (ref 0.5–2.0)

## 2015-11-01 LAB — PREPARE RBC (CROSSMATCH)

## 2015-11-01 LAB — HEMATOCRIT
HCT: 20.3 % — ABNORMAL LOW (ref 39.0–52.0)
HEMATOCRIT: 19.9 % — AB (ref 39.0–52.0)

## 2015-11-01 IMAGING — XA IR US GUIDE VASC ACCESS RIGHT
12 of 14 series · 13 of 24 positions shown · IV contrast (IODINE)
Comparison: None

INDICATION: Bleeding duodenal ulcer with hemodynamic instability. Please perform
mesenteric arteriogram and potential percutaneous embolization.

EXAM:
1. ULTRASOUND GUIDANCE FOR ARTERIAL ACCESS
2. CELIAC AND SUPERIOR MESENTERIC ARTERIOGRAM (1st ORDER)
3. GASTRODUODENAL ARTERIOGRAM (3rd ORDER) AND PERCUTANEOUS COIL
EMBOLIZATION
TECHNIQUE: Informed written consent was obtained from the patient after a
discussion of the risks, benefits and alternatives to treatment.
Questions regarding the procedure were encouraged and answered. A
timeout was performed prior to the initiation of the procedure.

[Series 1: fl - angio · 1 of 27 frames shown]
[frame 1/27]
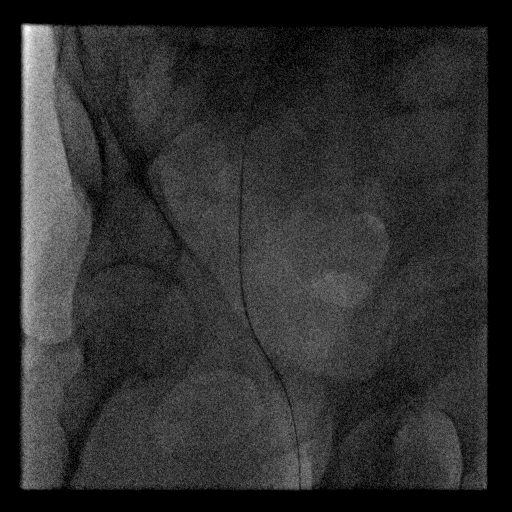

[Series 2: care body 4 · 1 of 40 frames shown (1 of 11)]
[frame 21/40]
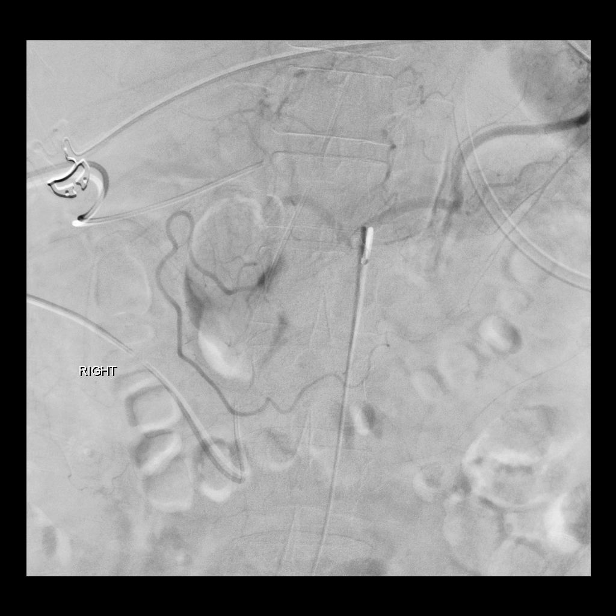

[Series 3: care body 4 · 1 of 23 frames shown (2 of 11)]
[frame 12/23]
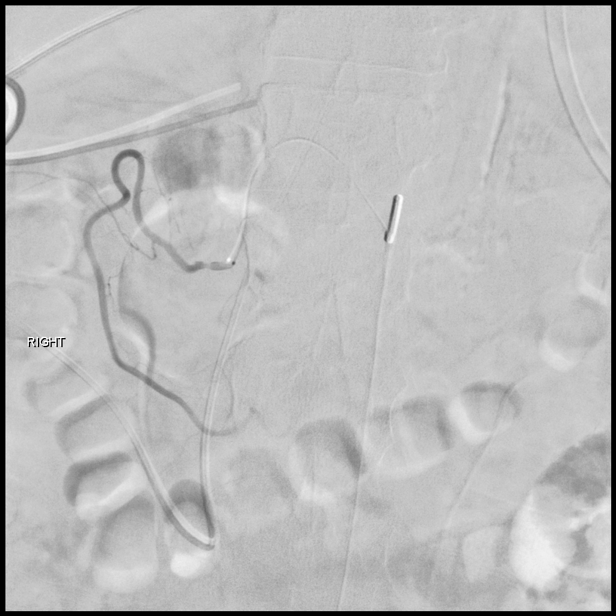

[Series 4: care body 4 · 1 of 22 frames shown (3 of 11)]
[frame 15/22]
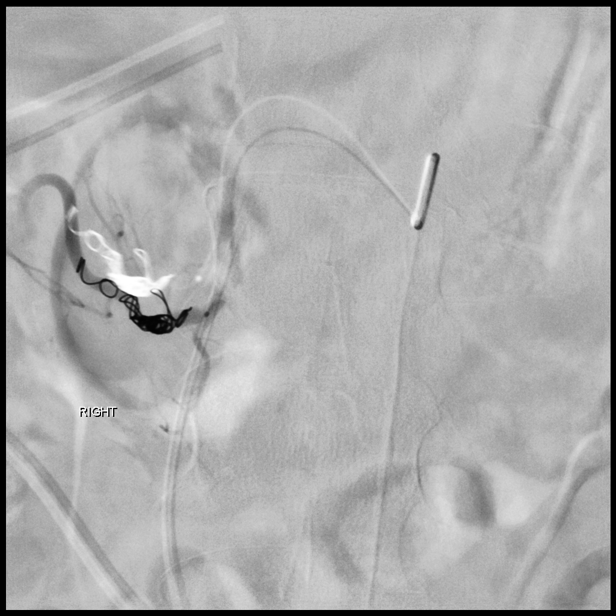

[Series 5: care body 4 · 1 of 25 frames shown (4 of 11)]
[frame 13/25]
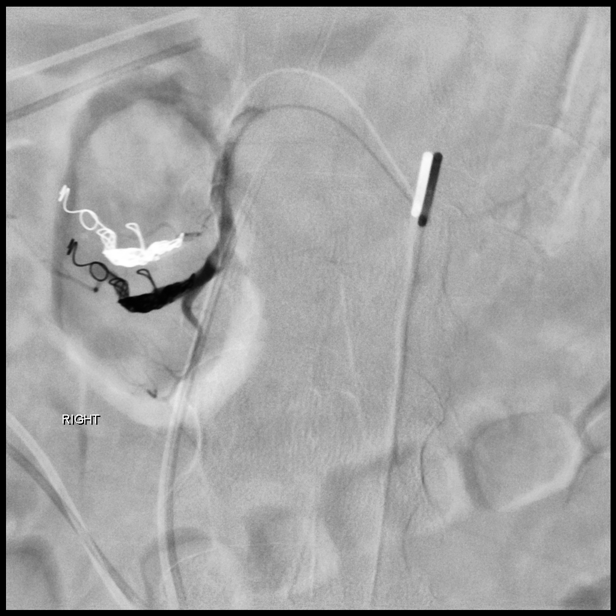

[Series 6: care body 4 · 1 of 25 frames shown (5 of 11)]
[frame 22/25]
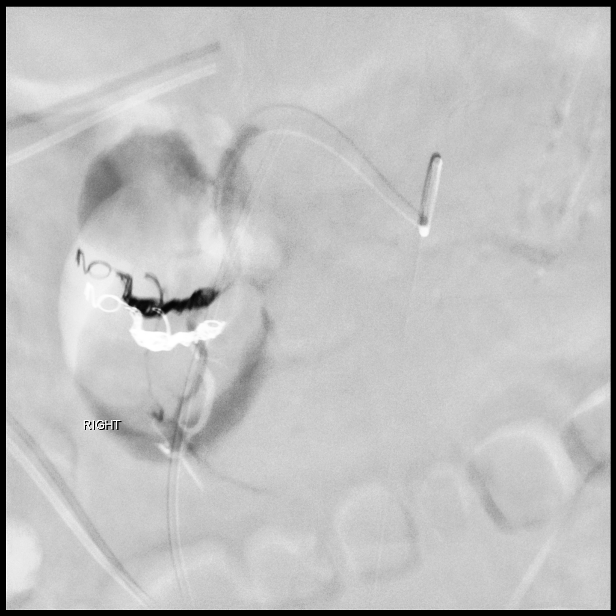

[Series 8: care body 4 · 2 of 27 frames shown (6 of 11)]
[frame 5/27]
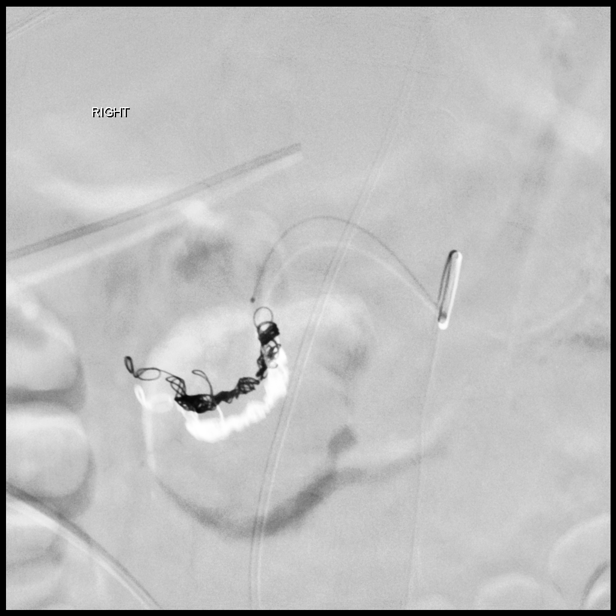
[frame 18/27]
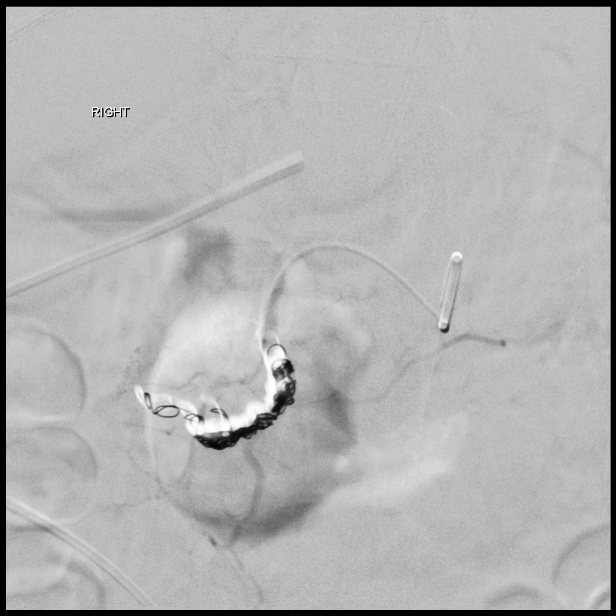

[Series 10: care body 4 · 1 of 22 frames shown (7 of 11)]
[frame 4/22]
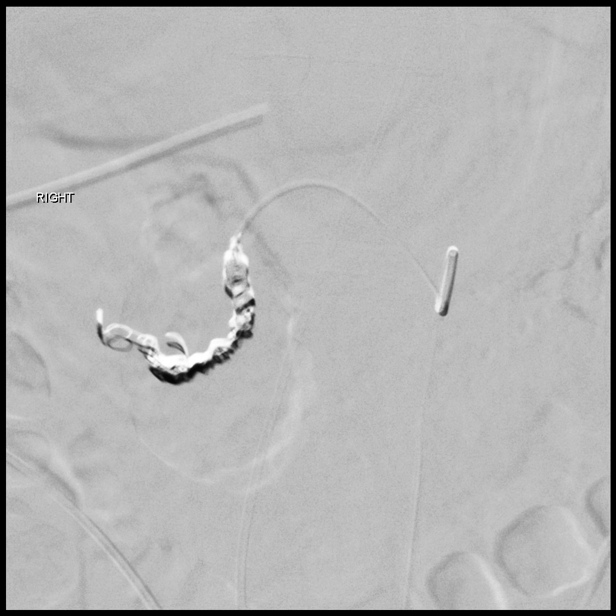

[Series 11: care body 4 · 1 of 25 frames shown (8 of 11)]
[frame 4/25]
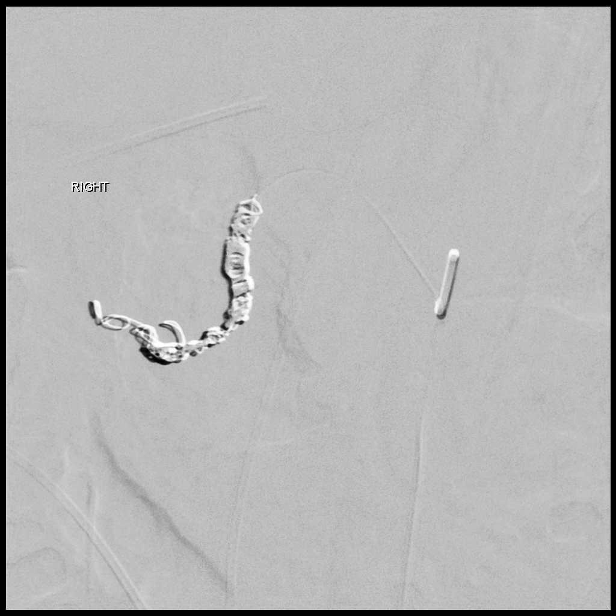

[Series 12: care body 4 · 1 of 20 frames shown (9 of 11)]
[frame 11/20]
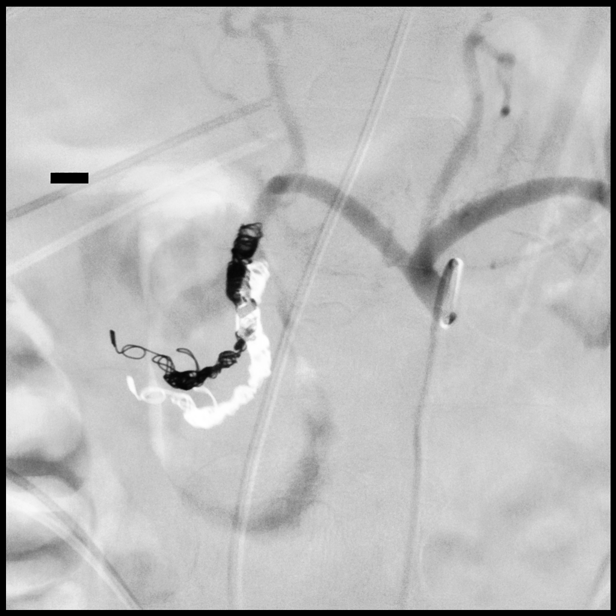

[Series 13: care body 4 · 1 of 20 frames shown (10 of 11)]
[frame 11/20]
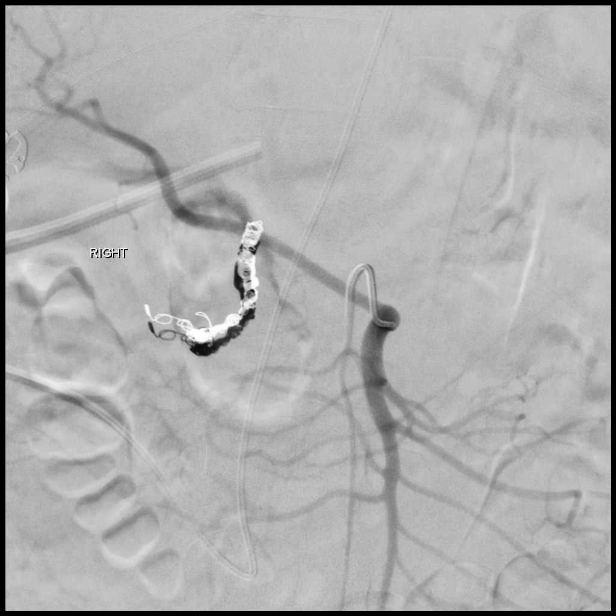

[Series 14: care body 4 · 1 of 19 frames shown (11 of 11)]
[frame 17/19]
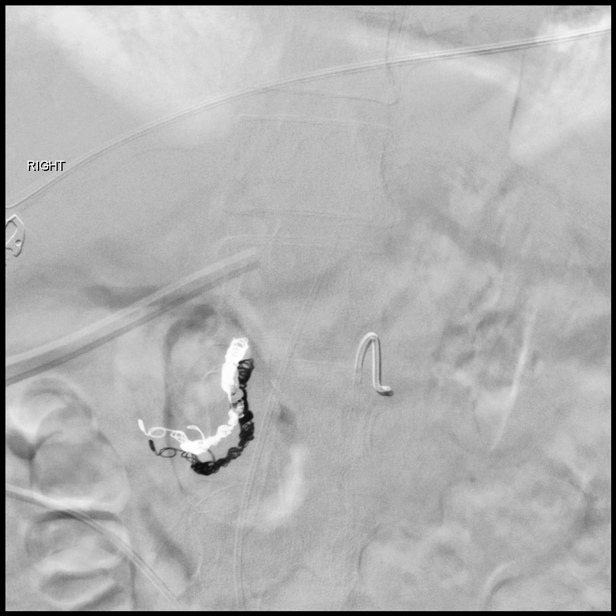

[13 of 24 positions shown; findings below may reference images not displayed]

MEDICATIONS:
None

CONTRAST:  85 cc Omnipaque 300

ANESTHESIA/SEDATION:
Fentanyl 50 mcg IV; Versed 1 mg IV

Total Moderate Sedation Time

45 minutes

FLUOROSCOPY TIME:  11 minutes 18 seconds (1,242 mGy)

COMPLICATIONS:
None immediate

ACCESS:
Right common femoral artery; hemostasis achieved with manual
compression.
The right groin was prepped and draped in the usual sterile fashion,
and a sterile drape was applied covering the operative field.
Maximum barrier sterile technique with sterile gowns and gloves were
used for the procedure. A timeout was performed prior to the
initiation of the procedure. Local anesthesia was provided with 1%
lidocaine.

The right femoral head was marked fluoroscopically. Under ultrasound
guidance, the right common femoral artery was accessed with a
micropuncture kit after the overlying soft tissues were anesthetized
with 1% lidocaine. An ultrasound image was saved for documentation
purposes. The micropuncture sheath was exchanged for a 5 French
vascular sheath over a Bentson wire. A closure arteriogram was
performed through the side of the sheath confirming access within
the right common femoral artery.

Over a Bentson wire, a Mickelson catheter was advanced to the level
of the thoracic aorta where it was back bled and flushed. The
catheter was then utilized to select the celiac artery and a celiac
arteriogram was performed.

Utilizing a Fathom 14 microwire, a regular renegade micro catheter
was utilized to select the gastroduodenal artery and a
gastroduodenal arteriogram was performed. The gastroduodenal artery
was percutaneously coiled embolized to near its origin. Post
embolization arteriogram demonstrates complete occlusion of the
gastroduodenal artery.

The Mickelson catheter was then utilized to select the superior
mesenteric artery and a superior mesenteric arteriogram was
performed.

Images reviewed and the procedure was terminated. All wires and
catheters and sheaths were removed from the patient. Hemostasis was
achieved at the right groin and access site with manual compression.
A dressing was placed. The patient tolerated procedure well without
immediate postprocedural complication. The patient was escorted to
nuclear medicine department for planar imaging.
FINDINGS: Celiac arteriogram demonstrates absence of a right hepatic artery as
well as hypertrophied supply of the distal aspect of the GDA to the
right gastroepiploic artery. Otherwise, conventional branching
pattern. No discrete areas of vessel irregularity or active
extravasation are seen with special attention paid to the duodenal
bulb.

Sub selective gastroduodenal arteriogram demonstrates mild focal
narrowing within the distal aspect of the GDA, again without
discrete area of vessel irregularity or active extravasation. The
gastroduodenal artery was successfully percutaneous coil embolized
from its distal aspect to near its origin. Completion celiac
arteriogram demonstrates complete occlusion of the GDA.

Superior mesenteric arteriogram demonstrates a replaced hepatic
artery arising from the proximal SMA. There is no definitive
hypertrophied supply to the duodenum or duodenal bulb.
IMPRESSION: Technically successful prophylactic embolization of the
gastroduodenal artery for bleeding duodenal ulcer.

## 2015-11-01 SURGERY — EGD (ESOPHAGOGASTRODUODENOSCOPY)
Anesthesia: Moderate Sedation

## 2015-11-01 MED ORDER — LIP MEDEX EX OINT
TOPICAL_OINTMENT | CUTANEOUS | Status: AC
  Administered 2015-11-01: 12:00:00
  Filled 2015-11-01: qty 7

## 2015-11-01 MED ORDER — POTASSIUM CHLORIDE 10 MEQ/100ML IV SOLN
10.0000 meq | INTRAVENOUS | Status: AC
  Administered 2015-11-01 (×4): 10 meq via INTRAVENOUS
  Filled 2015-11-01 (×4): qty 100

## 2015-11-01 MED ORDER — FENTANYL CITRATE (PF) 100 MCG/2ML IJ SOLN
INTRAMUSCULAR | Status: AC
  Filled 2015-11-01: qty 2

## 2015-11-01 MED ORDER — SODIUM CHLORIDE 0.9 % IV SOLN: Freq: Once | INTRAVENOUS | Status: AC

## 2015-11-01 MED ORDER — DIPHENHYDRAMINE HCL 50 MG/ML IJ SOLN: 25.0000 mg | Freq: Once | INTRAMUSCULAR | Status: DC

## 2015-11-01 MED ORDER — FUROSEMIDE 10 MG/ML IJ SOLN: 20.0000 mg | Freq: Once | INTRAMUSCULAR | Status: DC

## 2015-11-01 MED ORDER — EPINEPHRINE HCL 0.1 MG/ML IJ SOSY
PREFILLED_SYRINGE | INTRAMUSCULAR | Status: AC
  Filled 2015-11-01: qty 10

## 2015-11-01 MED ORDER — SODIUM CHLORIDE 0.9 % IV SOLN
8.0000 mg/h | INTRAVENOUS | Status: AC
  Administered 2015-11-01 – 2015-11-03 (×5): 8 mg/h via INTRAVENOUS
  Filled 2015-11-01 (×11): qty 80

## 2015-11-01 MED ORDER — INSULIN ASPART 100 UNIT/ML ~~LOC~~ SOLN: 0.0000 [IU] | SUBCUTANEOUS | Status: DC

## 2015-11-01 MED ORDER — FENTANYL CITRATE (PF) 100 MCG/2ML IJ SOLN
INTRAMUSCULAR | Status: DC | PRN
  Administered 2015-11-01 (×3): 25 ug via INTRAVENOUS

## 2015-11-01 MED ORDER — EPINEPHRINE HCL 1 MG/ML IJ SOLN
INTRAMUSCULAR | Status: DC | PRN
  Administered 2015-11-01: 4 mg via SUBCUTANEOUS

## 2015-11-01 MED ORDER — FENTANYL CITRATE (PF) 100 MCG/2ML IJ SOLN
50.0000 ug | Freq: Once | INTRAMUSCULAR | Status: AC
  Administered 2015-11-01: 50 ug via INTRAVENOUS

## 2015-11-01 MED ORDER — MIDAZOLAM HCL 10 MG/2ML IJ SOLN
INTRAMUSCULAR | Status: DC | PRN
  Administered 2015-11-01: 1 mg via INTRAVENOUS
  Administered 2015-11-01 (×2): 2 mg via INTRAVENOUS

## 2015-11-01 MED ORDER — MIDAZOLAM HCL 2 MG/2ML IJ SOLN
INTRAMUSCULAR | Status: AC
  Administered 2015-11-01: 1 mg via INTRAVENOUS
  Filled 2015-11-01: qty 2

## 2015-11-01 MED ORDER — SODIUM CHLORIDE 0.9 % IV SOLN
Freq: Once | INTRAVENOUS | Status: AC
  Administered 2015-11-01: 12:00:00 via INTRAVENOUS

## 2015-11-01 MED ORDER — ONDANSETRON HCL 4 MG/2ML IJ SOLN: 4.0000 mg | Freq: Four times a day (QID) | INTRAMUSCULAR | Status: DC | PRN

## 2015-11-01 MED ORDER — MIDAZOLAM HCL 5 MG/ML IJ SOLN
INTRAMUSCULAR | Status: AC
  Filled 2015-11-01: qty 2

## 2015-11-01 MED ORDER — FENTANYL CITRATE (PF) 100 MCG/2ML IJ SOLN
INTRAMUSCULAR | Status: AC
  Administered 2015-11-01: 50 ug via INTRAVENOUS
  Filled 2015-11-01: qty 2

## 2015-11-01 MED ORDER — MIDAZOLAM HCL 2 MG/2ML IJ SOLN
1.0000 mg | Freq: Once | INTRAMUSCULAR | Status: AC
  Administered 2015-11-01: 1 mg via INTRAVENOUS

## 2015-11-01 MED ORDER — LIDOCAINE-EPINEPHRINE 2 %-1:100000 IJ SOLN
INTRAMUSCULAR | Status: AC
  Filled 2015-11-01: qty 1

## 2015-11-01 NOTE — Progress Notes (Signed)
Critical Value- Received critical value call for Lactic Acid- 2.8, and Hemoglobin- 6.6. Joneen RoachPaul Hoffman, NP at bedside notified.   CRITICAL VALUE ALERT  Critical value received:  Lactic Acid 2.8, Hemoglobin 6.6  Date of notification:  11-01-15  Time of notification:  1755  Critical value read back:Yes.    Nurse who received alert:  Verdia KubaZoe Sharnise Blough  MD notified (1st page):  1755  Time MD responded:  [phone]

## 2015-11-01 NOTE — Op Note (Signed)
[facility] [address] [place], [id number]   ENDOSCOPY PROCEDURE REPORT  PATIENT: [name], Zachary  MR#: [id number] BIRTHDATE: 1966/05/29 , 49  yrs. old GENDER: male ENDOSCOPIST: Benancio DeedsSteven P Armbruster, MD REFERRED BY: PROCEDURE DATE:  11/01/2015 PROCEDURE:  EGD w/ control of bleeding and EGD w/ directed submucosal injection(s), any substance ASA CLASS:     Class III INDICATIONS:  melena and anemia secondary to chronic blood loss due to rebleeding of duodenal ulcer. MEDICATIONS: Fentanyl 75 mcg IV and Versed 5 mg IV TOPICAL ANESTHETIC:  DESCRIPTION OF PROCEDURE: After the risks benefits and alternatives of the procedure were thoroughly explained, informed consent was obtained.  The Pentax Gastroscope [id number] endoscope was introduced through the mouth and advanced to the second portion of the duodenum , Without limitations.  The instrument was slowly withdrawn as the mucosa was fully examined.    FINDINGS: The esophagus was normal.  A small clean based ulcer was noted just proximal to the GEJ.  The DH, GEJ, and SCJ were located 42cm from the incisors.  The stomach was normal in appearance.  The duodenal bulb was erythematous and edematous, however just distal to the duodenal bulb a large (>1cm) and deeply cratered ulcer was noted.  There was a visible vessel with active oozing noted on the lateral margin of it.  A 7Fr Goldprobe was used to cauterize the vessel, however given the location of the ulcer and more specifically where the vessel was, positioning of the Goldprobe was technically very challenging however cautery was applied to the vessel.  4cc's of Epinephrine was injected around the ulcer with good blanching.  Hemostasis was achieved following these measures. The 2nd portion of the duodenum was normal.  Retroflexed views revealed no abnormalities.     The scope was then withdrawn from the patient and the procedure completed.  COMPLICATIONS: There were  no immediate complications.     ENDOSCOPIC IMPRESSION: Clean based GEJ ulceration Normal stomach Large duodenal ulcer with visible vessel and active oozing. Treatment with Goldprobe and Epinephrine injection achieved hemostasis.  RECOMMENDATIONS: Return to medical ward Continue NPO status today, clears tomorrow if no evidence of rebleeding Continue IV protonix for 72 hours post-EGD H pylori IgG serology if not yet done Serial H/H We will continue to follow, please contact us if patient has evidence of rebleeding    eSigned:  Benancio DeedsSteven P Armbruster, MD 11/01/2015 3:08 PM    CC:  PATIENT NAME:  [name], Zachary MR#: [id number]

## 2015-11-01 NOTE — Progress Notes (Signed)
     [facility] Progress Note  Subjective:  Patient has no complaints.  Nursing reports one dark/maroon stool O/N, however.  Hgb down 2 grams with hours and is going to get 2 units PRBC's.  Objective:  Vital signs in last 24 hours: Temp:  [98.1 F (36.7 C)-98.8 F (37.1 C)] 98.3 F (36.8 C) (11/09 0435) Pulse Rate:  [94-115] 100 (11/09 0435) Resp:  [12-16] 16 (11/09 0435) BP: (105-128)/(59-81) 113/64 mmHg (11/09 0435) SpO2:  [95 %-99 %] 99 % (11/09 0435) Last BM Date: 11/01/15 General:  Alert, Well-developed, in NAD Heart:  Regular rate and rhythm; no murmurs Pulm:  CTAB.  No W/R/R. Abdomen:  Soft, non-distended. Normal bowel sounds.  Non-tender. Extremities:  Without edema. Neurologic:  Alert and  oriented x 4;  grossly normal neurologically.  Intake/Output from previous day: 11/08 0701 - 11/09 0700 In: 2638.8 [P.O.:90; I.V.:2218.8; Blood:330] Out: 1275 [Urine:1275]  Lab Results:  Recent Labs  10/29/15 1316  10/31/15 1250 10/31/15 1850 11/01/15 0407  WBC 8.4  --  6.5  --   --   HGB 6.4*  < > 8.2*  8.1* 6.4* 6.6*  HCT 20.5*  < > 24.4*  24.1* 19.1* 19.9*  PLT 306  --  181  --   --   < > = values in this interval not displayed. BMET  Recent Labs  10/29/15 1316  NA 138  K 4.3  CL 107  CO2 26  GLUCOSE 130*  BUN 20  CREATININE 0.89  CALCIUM 8.7*   LFT  Recent Labs  10/29/15 1930  PROT 4.4*  ALBUMIN 2.1*  AST 12*  ALT 10*  ALKPHOS 44  BILITOT 0.8  BILIDIR <0.1*  IBILI NOT CALCULATED   PT/INR  Recent Labs  10/29/15 1930  LABPROT 15.8*  INR 1.25   Assessment / Plan: 3*49 y/o male admitted with UGI bleed. Endoscopy 11/7 showed large duodenal ulcer with adherent clot. No endoscopic intervention was performed.  Continues to drop Hgb (from 8.2 grams to 6.4 grams within hours) and nurses reported one dark/maroon stool overnight.  -Patient to be re-evaluated with EGD this afternoon.  Has been placed NPO.  Agree with PRBC transfusion  and monitoring of Hgb.  Is on PPI gtt, which will be continued.     LOS: 3 days   ZEHR, JESSICA D.  11/01/2015, 9:08 AM  Pager number [phone]   Attending Addendum: I have taken an interval history, reviewed the chart, and examined the patient. I agree with the Advanced Practitioner's note and impression. Proceeding with EGD given recurrence of melena with drop in Hgb almost 2 points. Further recommendations pending the results. Discussed risks / benefits with patient and mother and they wish to proceed.   Ileene PatrickSteven Armend Hochstatter, MD [facility] Pager [phone]

## 2015-11-01 NOTE — Progress Notes (Signed)
eLink Physician-Brief Progress Note Patient Name: Zachary LoosenScott Thornton DOB: 12/31/1965 MRN: [id number]   Date of Service  11/01/2015  HPI/Events of Note  Camera in post IR Pt reports feeliong better has had some residual dark overall clots Hr 124, sys 160 Awake, fc, no distress on RA 100% sat  eICU Interventions  Cbc now, lactic coags zofran     Intervention Category Intermediate Interventions: Hypovolemia - evaluation and management  Archer Moist J. 11/01/2015, 8:31 PM

## 2015-11-01 NOTE — H&P (View-Only) (Signed)
     [facility] Progress Note  Subjective:  Patient has no complaints.  Nursing reports one dark/maroon stool O/N, however.  Hgb down 2 grams with hours and is going to get 2 units PRBC's.  Objective:  Vital signs in last 24 hours: Temp:  [98.1 F (36.7 C)-98.8 F (37.1 C)] 98.3 F (36.8 C) (11/09 0435) Pulse Rate:  [94-115] 100 (11/09 0435) Resp:  [12-16] 16 (11/09 0435) BP: (105-128)/(59-81) 113/64 mmHg (11/09 0435) SpO2:  [95 %-99 %] 99 % (11/09 0435) Last BM Date: 11/01/15 General:  Alert, Well-developed, in NAD Heart:  Regular rate and rhythm; no murmurs Pulm:  CTAB.  No W/R/R. Abdomen:  Soft, non-distended. Normal bowel sounds.  Non-tender. Extremities:  Without edema. Neurologic:  Alert and  oriented x 4;  grossly normal neurologically.  Intake/Output from previous day: 11/08 0701 - 11/09 0700 In: 2638.8 [P.O.:90; I.V.:2218.8; Blood:330] Out: 1275 [Urine:1275]  Lab Results:  Recent Labs  10/29/15 1316  10/31/15 1250 10/31/15 1850 11/01/15 0407  WBC 8.4  --  6.5  --   --   HGB 6.4*  < > 8.2*  8.1* 6.4* 6.6*  HCT 20.5*  < > 24.4*  24.1* 19.1* 19.9*  PLT 306  --  181  --   --   < > = values in this interval not displayed. BMET  Recent Labs  10/29/15 1316  NA 138  K 4.3  CL 107  CO2 26  GLUCOSE 130*  BUN 20  CREATININE 0.89  CALCIUM 8.7*   LFT  Recent Labs  10/29/15 1930  PROT 4.4*  ALBUMIN 2.1*  AST 12*  ALT 10*  ALKPHOS 44  BILITOT 0.8  BILIDIR <0.1*  IBILI NOT CALCULATED   PT/INR  Recent Labs  10/29/15 1930  LABPROT 15.8*  INR 1.25   Assessment / Plan: *49 y/o male admitted with UGI bleed. Endoscopy 11/7 showed large duodenal ulcer with adherent clot. No endoscopic intervention was performed.  Continues to drop Hgb (from 8.2 grams to 6.4 grams within hours) and nurses reported one dark/maroon stool overnight.  -Patient to be re-evaluated with EGD this afternoon.  Has been placed NPO.  Agree with PRBC transfusion  and monitoring of Hgb.  Is on PPI gtt, which will be continued.     LOS: 3 days   ZEHR, JESSICA D.  11/01/2015, 9:08 AM  Pager number [phone]   Attending Addendum: I have taken an interval history, reviewed the chart, and examined the patient. I agree with the Advanced Practitioner's note and impression. Proceeding with EGD given recurrence of melena with drop in Hgb almost 2 points. Further recommendations pending the results. Discussed risks / benefits with patient and mother and they wish to proceed.   Valerio Pinard, MD [facility] Pager [phone]  

## 2015-11-01 NOTE — Procedures (Signed)
Post mesenteric arteriogram and percutaneous coil embolization of the GDA for bleeding duodenal ulcer.   No immediate post procedural complications.   Keep right leg straight for 4 hrs.    SignedSimonne Come: Leathie Weich Pager: [phone]: [phone] 11/01/2015, 7:35 PM

## 2015-11-01 NOTE — Progress Notes (Signed)
Called urgently to bedside Large copious amount of dark maroon watery blood with chunks in it BP dropped 60/40's  Transer to ICU Stat Plain film abd pelv r/o perforation gen surgery consult requested CCM requested to oversee Ordered Lactic acid, CBC stat, Cmet stat  Set up 3 U PRBC Transfuse asap  D/w Mother at bedside who is understandably unsure what to do Brief discussion reveals if this wasn;t a durable deficit, she would want everything done.  RN aware to obtain Large bore IV access

## 2015-11-01 NOTE — Progress Notes (Signed)
Called urgently regarding patient bleeding from DU that was epi injected and cauterized earlier this afternoon.  Now patient having large amounts of maroon stool per rectum and BP in 60-70's systolic.  Dr. Mahala MenghiniSamtani made aware and had patient transferred to ICU/step-down and critical care team was seeing the patient when I got there with the patient's mother.  IR was called and made aware (Dr. Loreta AveWagner); they were coming to take patient urgently for angiography with embolization.  Surgery, Dr. Carolynne Edouardoth, also made aware of the patient and will see him to be on board as well.  Blood products are being transfused.  Plain abdominal film as been ordered per Dr. Lanetta InchArmbruster's request to rule out perf.   Attending Addendum I received a call this evening that the patient following endoscopy developed hypotension and passed blood per rectum concerning for active bleeding. [facility] (another GI bleed and food impaction) thus could not evaluate the patient at the time he developed these symptoms.  His EGD was remarkable for a large duodenal ulcer with a visible vessel that was actively oozing. The vessel was treated with Goldprobe cautery and epinephrine with a good result at the time of endoscopy. I suspect the vessel opened up post-procedure and now with significant bleeding. I spoke with the patient's mother about his ongoing bleeding and options moving forward. He is receiving PRBC and hypotension responded to fluids. IR was consulted and is having angiography now with hopefully successful embolization. Surgery is aware in case his bleeding persists. We will continue to follow the patient closely.   Ileene PatrickSteven Javen Hinderliter, MD [facility] Pager [phone]

## 2015-11-01 NOTE — Interval H&P Note (Signed)
History and Physical Interval Note:  11/01/2015 2:31 PM  Zachary Thornton  has presented today for surgery, with the diagnosis of Duodenal ulcer; GI bleeding  The various methods of treatment have been discussed with the patient and family. After consideration of risks, benefits and other options for treatment, the patient has consented to  Procedure(s): ESOPHAGOGASTRODUODENOSCOPY (EGD) (N/A) as a surgical intervention .  The patient's history has been reviewed, patient examined, no change in status, stable for surgery.  I have reviewed the patient's chart and labs.  Questions were answered to the patient's satisfaction.     Reeves ForthSteven Paul Qiana Landgrebe

## 2015-11-01 NOTE — OR Nursing (Signed)
Pts B/P low 80s systolic,  see VS, Dr Adela LankArmbruster and Dr Mahala MenghiniSamtani notified.

## 2015-11-01 NOTE — Progress Notes (Signed)
Progress Note   Zachary LoosenScott Thornton WUJ:[id number] DOB: 09/25/1966 DOA: 10/29/2015 PCP: Ezequiel KayserPERINI,[name], MD   Brief Narrative:    49 y.o. male the PMH of TBI admit 10/29/15 syncopal episode at church with brief loss of consciousness. EMS was called and the patient was brought to the ED for further evaluation where he was found to be hypotensive with a hemoglobin of 6.4.Rectal exam revealed Hemoccult positive stools. While in the ED, the patient had hematemesis and a large black stool. GI subsequently was consulted.  Assessment/Plan:   Principal Problem:  Syncope and collapse secondary to duodenal ulcer hemorrhage with hypotension/Acute blood loss anemia  - Status post 5 units of blood, aggressive IV fluids with stabilization of blood pressure. - Monitoring H&H Q 6 hours.  - EGD done by GI 10/30/15. Large ulcer found distal to the duodenal bulb with an adherent clot.  -further ? in Hb noted 11/01/15 am Rpt EGD showed Clean basd GEJ ulceration + Large duodenal ulcer + oozing Rx Goldprobe + EPi and hemostasis - Continue PPI.  NPO unless GI states otherwise - Transfer back to the floor if vitals stable.    Active Problems:  Transient hypotension/diaphoresis 2/2 to Sedation Unlikely bleed Given bolus 1000 cc with improvement Would keep on saline 75 cc/Hr for now If BP are stabilized would transfer back to floor    Urinary retention - Continues to have difficulty voiding. Continue I&O cath Q 6 hours PRN. - Mobilize.   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  - Continue Tegretol and Lexapro.  DVT prophylaxis - SCDs.   Family Communication/Anticipated D/C date and plan/Code Status   Family Communication: Mother updated bedside. Disposition Plan: Home when hemodynamically stable and hemoglobin stable 48 hours. Anticipated D/C date:    ? Code Status:     Code Status Orders        Start     Ordered   10/29/15 1856  Full code   Continuous     10/29/15 1855       IV Access:    Peripheral IV   Procedures and diagnostic studies:   EGD 10/30/15 EGD 11/01/15  Medical Consultants:    Gastroenterology: Napoleon FormKavitha Nandigam V, MD  Anti-Infectives:   Anti-infectives    None      Subjective:   Seen in Endo Diaphoretic and low BP earlier but no temp Received Fentanyl/Versed for sedation No cp No abd pain No n/v  Fair   Objective:    Filed Vitals:   10/31/15 2255 10/31/15 2330 11/01/15 0110 11/01/15 0435  BP: 109/64 115/61 109/59 113/64  Pulse: 106 98 96 100  Temp: 98.3 F (36.8 C) 98.6 F (37 C) 98.7 F (37.1 C) 98.3 F (36.8 C)  TempSrc: Oral Oral Oral Oral  Resp: 16 16 16 16   Height:      Weight:      SpO2: 97% 98% 99% 99%    Intake/Output Summary (Last 24 hours) at 11/01/15 0906 Last data filed at 11/01/15 0500  Gross per 24 hour  Intake 2638.75 ml  Output   1275 ml  Net 1363.75 ml   Filed Weights   10/29/15 1858 10/31/15 0354  Weight: 83.4 kg (183 lb 13.8 oz) 84.3 kg (185 lb 13.6 oz)    Exam: Gen:  NAD, color improved Cardiovascular:  RRR, No M/R/G Respiratory:  Lungs CTAB Gastrointestinal:  Abdomen soft,  NT/ND, + BS.  No rebound no guard, Extremities:  No C/E/C   Data Reviewed:    Labs: Basic Metabolic Panel:  Recent Labs Lab 10/29/15 1316  NA 138  K 4.3  CL 107  CO2 26  GLUCOSE 130*  BUN 20  CREATININE 0.89  CALCIUM 8.7*   GFR Estimated Creatinine Clearance: 100.4 mL/min (by C-G formula based on Cr of 0.89). Liver Function Tests:  Recent Labs Lab 10/29/15 1930  AST 12*  ALT 10*  ALKPHOS 44  BILITOT 0.8  PROT 4.4*  ALBUMIN 2.1*   Coagulation profile  Recent Labs Lab 10/29/15 1930  INR 1.25    CBC:  Recent Labs Lab 10/29/15 1316  10/30/15 2235 10/31/15 0720 10/31/15 1250 10/31/15 1850 11/01/15 0407  WBC 8.4  --   --   --   6.5  --   --   HGB 6.4*  < > 6.9* 8.3* 8.2*  8.1* 6.4* 6.6*  HCT 20.5*  < > 20.4* 24.7* 24.4*  24.1* 19.1* 19.9*  MCV 98.1  --   --   --  86.8  --   --   PLT 306  --   --   --  181  --   --   < > = values in this interval not displayed. CBG:  Recent Labs Lab 10/29/15 1237  GLUCAP 107*    Microbiology Recent Results (from the past 240 hour(s))  MRSA PCR Screening     Status: None   Collection Time: 10/29/15  6:30 PM  Result Value Ref Range Status   MRSA by PCR NEGATIVE NEGATIVE Final    Comment:        The GeneXpert MRSA Assay (FDA approved for NASAL specimens only), is one component of a comprehensive MRSA colonization surveillance program. It is not intended to diagnose MRSA infection nor to guide or monitor treatment for MRSA infections.      Medications:   . sodium chloride   Intravenous Once  . antiseptic oral rinse  7 mL Mouth Rinse BID  . carbamazepine  600 mg Oral BID  . cholecalciferol  1,000 Units Oral Daily  . escitalopram  20 mg Oral Daily  . multivitamin with minerals  1 tablet Oral Daily  . [START ON 11/03/2015] pantoprazole (PROTONIX) IV  40 mg Intravenous Q12H   Continuous Infusions: . sodium chloride 75 mL/hr at 10/31/15 0600  . pantoprozole (PROTONIX) infusion 8 mg/hr (11/01/15 0438)    Time spent: 25 minutes.  Pleas Koch, MD [facility] [phone]   11/01/2015, 9:06 AM

## 2015-11-01 NOTE — Consult Note (Signed)
PULMONARY / CRITICAL CARE MEDICINE   Name: Zachary Thornton MRN: [id number] DOB: 06/30/1966    ADMISSION DATE:  10/29/2015 CONSULTATION DATE:  11/01/2015  REFERRING MD :  Dr. Mahala MenghiniSamtani  CHIEF COMPLAINT:  GIB  INITIAL PRESENTATION: 49 year old male [facility] 11/6 with acute GI bleeding and blood loss anemia. He was transfused and given IVF resuscitation. EGD revealed duodenal ulcer with clot intact. Recurrent bleeding with EGD again 11/9 showed GE junction ulcer oozing. Treated during scope. Shortly after he had deterioration in BP and passed a large amount of BRBPR. Moved to ICU, PCCM consult.  STUDIES:  11/7 EGD > Post bulbar duodenal ulcer with blood clot intact 11/9 EGD > Clean based GEJ ulceration. Large duodenal ulcer with visible vessel and active oozing. Treatment with Goldprobe and Epinephrine injection achieved hemostasis.  SIGNIFICANT EVENTS: 11/6 admit for GIB after passing out at church 11/9 Large BRBPR, shock, to ICU  HISTORY OF PRESENT ILLNESS:  49 year old male with PMH as below, which includes TBI with resultant behavioral disturbances had a syncopal episode initially a few weeks PTA while waiting for his mother in a waiting room. EMS was called at that time, however, patient refused to be transported to ED. PCP was contacted and dose of propranolol was decreased. 11/6 he again suffered a syncopal episode, this time while at church. He was reportedly unresponsive for less than a minute. He was brought to the emergency department for further evaluation. Initial blood work was notable for Hgb 6.4. He was transfused PRBC, provided IVF, and was admitted to the hospitalist team. Underwent EGD 11/7 and found duodenal ulcer with clot in place. Conservative management recommended. Even after this he remained anemic on AM labs requiring transfusion. 11/9 he remained anemic and went back for EGD which found several areas of ulceration including one at GE junction. One was oozing and treated  with Goldprobe and epinephrine injection. Shortly after returning from EGD he became hypotensive and had a significantly bloody BM. He was transferred to ICU and transfusion was initiated. PCCM to see. Of note he was recently on prednisone, but the duration, and when he stopped taking it are unclear.   PAST MEDICAL HISTORY :   has a past medical history of TBI (traumatic brain injury) (HCC); Back injury; and Dementia with behavioral disturbance.  has past surgical history that includes CSF shunt; Craniotomy; and Esophagogastroduodenoscopy (egd) with propofol (N/A, 10/30/2015). Prior to Admission medications   Medication Sig Start Date End Date Taking? Authorizing Provider  carbamazepine (TEGRETOL) 200 MG tablet Take 600 mg by mouth 2 (two) times daily.   Yes Historical Provider, MD  cholecalciferol (VITAMIN D) 1000 UNITS tablet Take 1,000 Units by mouth daily.   Yes Historical Provider, MD  escitalopram (LEXAPRO) 20 MG tablet Take 20 mg by mouth daily.   Yes Historical Provider, MD  fish oil-omega-3 fatty acids 1000 MG capsule Take 4 g by mouth 2 (two) times daily.    Yes Historical Provider, MD  Multiple Vitamin (MULTIVITAMIN WITH MINERALS) TABS Take 1 tablet by mouth daily.   Yes Historical Provider, MD  propranolol (INDERAL) 60 MG tablet Take 120 mg by mouth 2 (two) times daily.   Yes Historical Provider, MD   Allergies  Allergen Reactions  . Nuedexta [Dextromethorphan-Quinidine] Anaphylaxis    Lips began to swell    FAMILY HISTORY:  indicated that his mother is alive. He indicated that his father is deceased.  SOCIAL HISTORY:  reports that he has quit smoking.  He has never used smokeless tobacco. He reports that he does not drink alcohol or use illicit drugs.  REVIEW OF SYSTEMS:    SUBJECTIVE:   VITAL SIGNS: Temp:  [97.4 F (36.3 C)-98.9 F (37.2 C)] 97.8 F (36.6 C) (11/09 1640) Pulse Rate:  [69-107] 93 (11/09 1658) Resp:  [11-20] 16 (11/09 1642) BP: (69-155)/(28-90) 75/36  mmHg (11/09 1658) SpO2:  [94 %-100 %] 99 % (11/09 1642) HEMODYNAMICS:   VENTILATOR SETTINGS:   INTAKE / OUTPUT:  Intake/Output Summary (Last 24 hours) at 11/01/15 1721 Last data filed at 11/01/15 1358  Gross per 24 hour  Intake 3191.25 ml  Output   1750 ml  Net 1441.25 ml    PHYSICAL EXAMINATION: General:  Male of normal body habitus, appears pale, in NAD Neuro:  Alert, flat affect at baseline per primary team HEENT:  Bitemporal depressions. NO appreciable JVD Cardiovascular:  Borderline tachy, sinus, no MRG Lungs:  Clear bilateral breath sounds Abdomen:  Soft, non-tender, non-distended Musculoskeletal:  No acute deformity or ROM limitation Skin:  Pallor, Grossly intact  LABS:  CBC  Recent Labs Lab 10/29/15 1316  10/31/15 1250 10/31/15 1850 11/01/15 0407 11/01/15 1100  WBC 8.4  --  6.5  --   --   --   HGB 6.4*  < > 8.2*  8.1* 6.4* 6.6* 6.8*  HCT 20.5*  < > 24.4*  24.1* 19.1* 19.9* 20.3*  PLT 306  --  181  --   --   --   < > = values in this interval not displayed. Coag's  Recent Labs Lab 10/29/15 1930  INR 1.25   BMET  Recent Labs Lab 10/29/15 1316  NA 138  K 4.3  CL 107  CO2 26  BUN 20  CREATININE 0.89  GLUCOSE 130*   Electrolytes  Recent Labs Lab 10/29/15 1316  CALCIUM 8.7*   Sepsis Markers No results for input(s): LATICACIDVEN, PROCALCITON, O2SATVEN in the last 168 hours. ABG No results for input(s): PHART, PCO2ART, PO2ART in the last 168 hours. Liver Enzymes  Recent Labs Lab 10/29/15 1930  AST 12*  ALT 10*  ALKPHOS 44  BILITOT 0.8  ALBUMIN 2.1*   Cardiac Enzymes No results for input(s): TROPONINI, PROBNP in the last 168 hours. Glucose  Recent Labs Lab 10/29/15 1237  GLUCAP 107*    Imaging No results found.   ASSESSMENT / PLAN:  PULMONARY A: No acute issues  P:   Supplemental O2 PRN to maintain SpO2 > 92%  CARDIOVASCULAR A:  Hemorrhagic shock secondary to acute GI bleeding Lactic acidosis  P:   Telemetry monitoring MAP goal > 65 mm/Hg Transfuse per HEME section IVF resuscitation  RENAL A:   Hypokalemia Hypocalcemia (corrects to 8.5)  P:   Replete K 40 meq IV Repeat BMP in AM  GASTROINTESTINAL A:   Acute GI bleeding secondary to duodenal ulcer  P:   NPO Protonix infusion GI following Management per HEME section  HEMATOLOGIC A:   Acute blood loss anemia with hemorrhagic shock secondary to GIB  P:  Transfuse for HGB > 10 in setting active bleed 3 units PRBC ordered by primary team To IR for angiography and hopeful forl embolization Surgery aware if IR unsuccessful  H&H every 6 hours Assess PT/INR  INFECTIOUS A:   No acute issues  P:   Monitor off ABX  ENDOCRINE A:   Recent prednisone use  P:   Check cortisol Consider stress dose steroids  NEUROLOGIC A:   History of TBI Chronic  encephalopathy, at baseline  P:   RASS goal: 0 Monitor Continue carbamazepine, escitalopram when can take PO  FAMILY  - Updates: Discussed plan of care with Dr Jamison Neighbor, patient, and mother at bedside 11/9.  - Inter-disciplinary family meet or Palliative Care meeting due by:  11/16   Joneen Roach, AGACNP-BC [facility] Pager [phone] or [phone]  11/01/2015 5:54 PM

## 2015-11-02 ENCOUNTER — Encounter (HOSPITAL_COMMUNITY): Payer: Self-pay | Admitting: General Surgery

## 2015-11-02 LAB — TYPE AND SCREEN
ABO/RH(D): O NEG
Antibody Screen: NEGATIVE
Unit division: 0
Unit division: 0
Unit division: 0
Unit division: 0
Unit division: 0
Unit division: 0
Unit division: 0
Unit division: 0
Unit division: 0
Unit division: 0
Unit division: 0

## 2015-11-02 LAB — BASIC METABOLIC PANEL
Anion gap: 7 (ref 5–15)
BUN: 16 mg/dL (ref 6–20)
CALCIUM: 7.2 mg/dL — AB (ref 8.9–10.3)
CO2: 18 mmol/L — ABNORMAL LOW (ref 22–32)
CREATININE: 0.83 mg/dL (ref 0.61–1.24)
Chloride: 114 mmol/L — ABNORMAL HIGH (ref 101–111)
GFR calc Af Amer: >60 mL/min (ref >60)
GLUCOSE: 152 mg/dL — AB (ref 65–99)
POTASSIUM: 4 mmol/L (ref 3.5–5.1)
SODIUM: 139 mmol/L (ref 135–145)

## 2015-11-02 LAB — CBC
HCT: 33.6 % — ABNORMAL LOW (ref 39.0–52.0)
HCT: 25.3 % — ABNORMAL LOW (ref 39.0–52.0)
HEMATOCRIT: 26.1 % — AB (ref 39.0–52.0)
HEMOGLOBIN: 11.5 g/dL — AB (ref 13.0–17.0)
HEMOGLOBIN: 8.7 g/dL — AB (ref 13.0–17.0)
Hemoglobin: 9 g/dL — ABNORMAL LOW (ref 13.0–17.0)
MCH: 30.3 pg (ref 26.0–34.0)
MCH: 30.1 pg (ref 26.0–34.0)
MCH: 30 pg (ref 26.0–34.0)
MCHC: 34.2 g/dL (ref 30.0–36.0)
MCHC: 34.5 g/dL (ref 30.0–36.0)
MCHC: 34.4 g/dL (ref 30.0–36.0)
MCV: 88.4 fL (ref 78.0–100.0)
MCV: 87.3 fL (ref 78.0–100.0)
MCV: 87.2 fL (ref 78.0–100.0)
PLATELETS: 141 10*3/uL — AB (ref 150–400)
PLATELETS: 146 10*3/uL — AB (ref 150–400)
PLATELETS: 147 10*3/uL — AB (ref 150–400)
RBC: 3.8 MIL/uL — AB (ref 4.22–5.81)
RBC: 2.99 MIL/uL — AB (ref 4.22–5.81)
RBC: 2.9 MIL/uL — AB (ref 4.22–5.81)
RDW: 15.4 % (ref 11.5–15.5)
RDW: 15.8 % — AB (ref 11.5–15.5)
RDW: 16.1 % — ABNORMAL HIGH (ref 11.5–15.5)
WBC: 13.2 10*3/uL — ABNORMAL HIGH (ref 4.0–10.5)
WBC: 8.7 10*3/uL (ref 4.0–10.5)
WBC: 10 10*3/uL (ref 4.0–10.5)

## 2015-11-02 LAB — HEMOGLOBIN A1C
HEMOGLOBIN A1C: 6.1 % — AB (ref 4.8–5.6)
MEAN PLASMA GLUCOSE: 128 mg/dL

## 2015-11-02 LAB — GLUCOSE, CAPILLARY
GLUCOSE-CAPILLARY: 131 mg/dL — AB (ref 65–99)
GLUCOSE-CAPILLARY: 137 mg/dL — AB (ref 65–99)
GLUCOSE-CAPILLARY: 99 mg/dL (ref 65–99)
GLUCOSE-CAPILLARY: 99 mg/dL (ref 65–99)
Glucose-Capillary: 110 mg/dL — ABNORMAL HIGH (ref 65–99)
Glucose-Capillary: 115 mg/dL — ABNORMAL HIGH (ref 65–99)
Glucose-Capillary: 110 mg/dL — ABNORMAL HIGH (ref 65–99)

## 2015-11-02 LAB — PROTIME-INR
INR: 1.14 (ref 0.00–1.49)
Prothrombin Time: 14.8 seconds (ref 11.6–15.2)

## 2015-11-02 LAB — MAGNESIUM: Magnesium: 1.6 mg/dL — ABNORMAL LOW (ref 1.7–2.4)

## 2015-11-02 LAB — TROPONIN I
Troponin I: <0.03 ng/mL (ref <0.031)
Troponin I: <0.03 ng/mL (ref <0.031)
Troponin I: <0.03 ng/mL (ref <0.031)

## 2015-11-02 LAB — CORTISOL: Cortisol, Plasma: 20.8 ug/dL

## 2015-11-02 MED ORDER — MAGNESIUM SULFATE 2 GM/50ML IV SOLN
2.0000 g | Freq: Once | INTRAVENOUS | Status: AC
  Administered 2015-11-02: 2 g via INTRAVENOUS
  Filled 2015-11-02: qty 50

## 2015-11-02 NOTE — Progress Notes (Signed)
From Procedure in IR earlier I needed to waste some medications:  Fentanyl 50mg  IV and Versed 2mg  IV; Cruzita LedererMike Morgan witnessed the waste.

## 2015-11-02 NOTE — Consult Note (Signed)
Reason for Consult:GI bleed Referring Physician: Dr. Iverson Alamin is an 49 y.o. male.  HPI: The pt is a 49yo wm who was admitted several days ago with a syncopal episode. He was foung to have a low hg. He underwent upper endo which showed a duodenal ulcer with visible vessel. He had signs of ongoing bleeding Wednesday night and was taken to IR for embolization. I saw him in IR suite. He was comfortable and alert. He was being transfused and after the procedure his sbp was in the 150's.  Past Medical History  Diagnosis Date  . TBI (traumatic brain injury) ([place])   . Back injury   . Dementia with behavioral disturbance   . Duodenal ulcer hemorrhage   . GI bleed     Past Surgical History  Procedure Laterality Date  . Csf shunt    . Craniotomy      Post TBI craniotomy and plate insertion  . Esophagogastroduodenoscopy (egd) with propofol N/A 10/30/2015    Procedure: ESOPHAGOGASTRODUODENOSCOPY (EGD) WITH PROPOFOL;  Surgeon: Milus Banister, MD;  Location: [facility];  Service: Endoscopy;  Laterality: N/A;  . Esophagogastroduodenoscopy N/A 11/01/2015    Procedure: ESOPHAGOGASTRODUODENOSCOPY (EGD);  Surgeon: Manus Gunning, MD;  Location: [facility];  Service: Gastroenterology;  Laterality: N/A;    Family History  Problem Relation Age of Onset  . Heart disease Father     MVR  . Hypertension Mother     Social History:  reports that he has quit smoking. He has never used smokeless tobacco. He reports that he does not drink alcohol or use illicit drugs.  Allergies:  Allergies  Allergen Reactions  . Nuedexta [Dextromethorphan-Quinidine] Anaphylaxis    Lips began to swell  . Nsaids Other (See Comments)    Due to GI bleeding    Medications: I have reviewed the patient's current medications.  Results for orders placed or performed during the hospital encounter of 10/29/15 (from the past 48 hour(s))  Hematocrit     Status: Abnormal   Collection Time: 10/31/15  6:50 PM   Result Value Ref Range   HCT 19.1 (L) 39.0 - 52.0 %  Hemoglobin     Status: Abnormal   Collection Time: 10/31/15  6:50 PM  Result Value Ref Range   Hemoglobin 6.4 (LL) 13.0 - 17.0 g/dL    Comment: RESULT REPEATED AND VERIFIED DELTA CHECK NOTED CRITICAL RESULT CALLED TO, READ BACK BY AND VERIFIED WITH: Letitia Caul RN @ 2050 ON 10/31/15 BY C DAVIS   Prepare RBC     Status: None   Collection Time: 10/31/15 10:00 PM  Result Value Ref Range   Order Confirmation ORDER PROCESSED BY BLOOD BANK   Hematocrit     Status: Abnormal   Collection Time: 11/01/15  4:07 AM  Result Value Ref Range   HCT 19.9 (L) 39.0 - 52.0 %  Hemoglobin     Status: Abnormal   Collection Time: 11/01/15  4:07 AM  Result Value Ref Range   Hemoglobin 6.6 (LL) 13.0 - 17.0 g/dL    Comment: CRITICAL VALUE NOTED.  VALUE IS CONSISTENT WITH PREVIOUSLY REPORTED AND CALLED VALUE.  Prepare RBC     Status: None   Collection Time: 11/01/15  7:30 AM  Result Value Ref Range   Order Confirmation ORDER PROCESSED BY BLOOD BANK   Hematocrit     Status: Abnormal   Collection Time: 11/01/15 11:00 AM  Result Value Ref Range   HCT 20.3 (L) 39.0 - 52.0 %  Hemoglobin     Status: Abnormal   Collection Time: 11/01/15 11:00 AM  Result Value Ref Range   Hemoglobin 6.8 (LL) 13.0 - 17.0 g/dL    Comment: CRITICAL VALUE NOTED.  VALUE IS CONSISTENT WITH PREVIOUSLY REPORTED AND CALLED VALUE.  Comprehensive metabolic panel     Status: Abnormal   Collection Time: 11/01/15  4:58 PM  Result Value Ref Range   Sodium 141 135 - 145 mmol/L   Potassium 3.3 (L) 3.5 - 5.1 mmol/L   Chloride 117 (H) 101 - 111 mmol/L   CO2 20 (L) 22 - 32 mmol/L   Glucose, Bld 144 (H) 65 - 99 mg/dL   BUN 19 6 - 20 mg/dL   Creatinine, Ser 0.93 0.61 - 1.24 mg/dL   Calcium 6.6 (L) 8.9 - 10.3 mg/dL   Total Protein 3.1 (L) 6.5 - 8.1 g/dL   Albumin 1.6 (L) 3.5 - 5.0 g/dL   AST 15 15 - 41 U/L   ALT 9 (L) 17 - 63 U/L   Alkaline Phosphatase 34 (L) 38 - 126 U/L   Total  Bilirubin 0.4 0.3 - 1.2 mg/dL   GFR calc non Af Amer >60 >60 mL/min   GFR calc Af Amer >60 >60 mL/min    Comment: (NOTE) The eGFR has been calculated using the CKD EPI equation. This calculation has not been validated in all clinical situations. eGFR's persistently <60 mL/min signify possible Chronic Kidney Disease.    Anion gap 4 (L) 5 - 15  Lactic acid, plasma     Status: Abnormal   Collection Time: 11/01/15  4:58 PM  Result Value Ref Range   Lactic Acid, Venous 2.8 (HH) 0.5 - 2.0 mmol/L    Comment: CRITICAL RESULT CALLED TO, READ BACK BY AND VERIFIED WITH: Z.SUGGS RN AT 4098 ON 11.9.16 BY W.BUCHHOLZ.   CBC with Differential/Platelet     Status: Abnormal   Collection Time: 11/01/15  4:58 PM  Result Value Ref Range   WBC 11.2 (H) 4.0 - 10.5 K/uL   RBC 2.19 (L) 4.22 - 5.81 MIL/uL   Hemoglobin 6.6 (LL) 13.0 - 17.0 g/dL    Comment: REPEATED TO VERIFY CRITICAL RESULT CALLED TO, READ BACK BY AND VERIFIED WITH: ZOEY SUGGS RN 11.9.16 @ 1745 BY RICEJ    HCT 19.8 (L) 39.0 - 52.0 %   MCV 90.4 78.0 - 100.0 fL   MCH 30.1 26.0 - 34.0 pg   MCHC 33.3 30.0 - 36.0 g/dL   RDW 16.6 (H) 11.5 - 15.5 %   Platelets 165 150 - 400 K/uL   Neutrophils Relative % 81 %   Lymphocytes Relative 12 %   Monocytes Relative 6 %   Eosinophils Relative 1 %   Basophils Relative 0 %   Neutro Abs 9.1 (H) 1.7 - 7.7 K/uL   Lymphs Abs 1.3 0.7 - 4.0 K/uL   Monocytes Absolute 0.7 0.1 - 1.0 K/uL   Eosinophils Absolute 0.1 0.0 - 0.7 K/uL   Basophils Absolute 0.0 0.0 - 0.1 K/uL   RBC Morphology POLYCHROMASIA PRESENT    Smear Review LARGE PLATELETS PRESENT   Type and screen [place]     Status: None   Collection Time: 11/01/15  4:58 PM  Result Value Ref Range   ABO/RH(D) O NEG    Antibody Screen NEG    Sample Expiration 11/04/2015   Prepare RBC     Status: None   Collection Time: 11/01/15  5:02 PM  Result Value Ref Range  Order Confirmation ORDER PROCESSED BY BLOOD BANK   Lactic acid,  plasma     Status: Abnormal   Collection Time: 11/01/15  8:50 PM  Result Value Ref Range   Lactic Acid, Venous 2.2 (HH) 0.5 - 2.0 mmol/L    Comment: CRITICAL RESULT CALLED TO, READ BACK BY AND VERIFIED WITH: Lucila Maine RN 2126 11/01/15 A NAVARRO   Cortisol     Status: None   Collection Time: 11/01/15  8:50 PM  Result Value Ref Range   Cortisol, Plasma 20.8 ug/dL    Comment: (NOTE) AM    6.7 - 22.6 ug/dL PM   <10.0       ug/dL Performed at [facility]   Hemoglobin A1c     Status: Abnormal   Collection Time: 11/01/15  8:50 PM  Result Value Ref Range   Hgb A1c MFr Bld 6.1 (H) 4.8 - 5.6 %    Comment: (NOTE)         Pre-diabetes: 5.7 - 6.4         Diabetes: >6.4         Glycemic control for adults with diabetes: <7.0    Mean Plasma Glucose 128 mg/dL    Comment: (NOTE) Performed At: [facility] [place], [place] [id number] Lindon Romp MD WY:[id number]   Basic metabolic panel     Status: Abnormal   Collection Time: 11/01/15  8:50 PM  Result Value Ref Range   Sodium 141 135 - 145 mmol/L   Potassium 3.9 3.5 - 5.1 mmol/L   Chloride 118 (H) 101 - 111 mmol/L   CO2 20 (L) 22 - 32 mmol/L   Glucose, Bld 165 (H) 65 - 99 mg/dL   BUN 18 6 - 20 mg/dL   Creatinine, Ser 0.88 0.61 - 1.24 mg/dL   Calcium 6.6 (L) 8.9 - 10.3 mg/dL   GFR calc non Af Amer >60 >60 mL/min   GFR calc Af Amer >60 >60 mL/min    Comment: (NOTE) The eGFR has been calculated using the CKD EPI equation. This calculation has not been validated in all clinical situations. eGFR's persistently <60 mL/min signify possible Chronic Kidney Disease.    Anion gap 3 (L) 5 - 15  CBC     Status: Abnormal   Collection Time: 11/01/15  8:50 PM  Result Value Ref Range   WBC 11.6 (H) 4.0 - 10.5 K/uL   RBC 3.13 (L) 4.22 - 5.81 MIL/uL   Hemoglobin 9.3 (L) 13.0 - 17.0 g/dL    Comment: DELTA CHECK NOTED POST TRANSFUSION SPECIMEN    HCT 27.6 (L) 39.0 - 52.0 %   MCV 88.2 78.0 - 100.0 fL   MCH 29.7  26.0 - 34.0 pg   MCHC 33.7 30.0 - 36.0 g/dL   RDW 14.8 11.5 - 15.5 %   Platelets 141 (L) 150 - 400 K/uL  Protime-INR     Status: Abnormal   Collection Time: 11/01/15  8:50 PM  Result Value Ref Range   Prothrombin Time 16.5 (H) 11.6 - 15.2 seconds   INR 1.32 0.00 - 1.49  APTT     Status: None   Collection Time: 11/01/15  8:50 PM  Result Value Ref Range   aPTT 28 24 - 37 seconds  Glucose, capillary     Status: Abnormal   Collection Time: 11/01/15  9:03 PM  Result Value Ref Range   Glucose-Capillary 137 (H) 65 - 99 mg/dL   Comment 1 Notify RN  Glucose, capillary     Status: Abnormal   Collection Time: 11/01/15 11:56 PM  Result Value Ref Range   Glucose-Capillary 131 (H) 65 - 99 mg/dL   Comment 1 Notify RN   Troponin I     Status: None   Collection Time: 11/02/15  2:10 AM  Result Value Ref Range   Troponin I <0.03 <0.031 ng/mL    Comment:        NO INDICATION OF MYOCARDIAL INJURY.   CBC     Status: Abnormal   Collection Time: 11/02/15  2:10 AM  Result Value Ref Range   WBC 13.2 (H) 4.0 - 10.5 K/uL   RBC 3.80 (L) 4.22 - 5.81 MIL/uL   Hemoglobin 11.5 (L) 13.0 - 17.0 g/dL   HCT 33.6 (L) 39.0 - 52.0 %   MCV 88.4 78.0 - 100.0 fL   MCH 30.3 26.0 - 34.0 pg   MCHC 34.2 30.0 - 36.0 g/dL   RDW 15.4 11.5 - 15.5 %   Platelets 141 (L) 150 - 400 K/uL  Basic metabolic panel     Status: Abnormal   Collection Time: 11/02/15  2:10 AM  Result Value Ref Range   Sodium 139 135 - 145 mmol/L   Potassium 4.0 3.5 - 5.1 mmol/L   Chloride 114 (H) 101 - 111 mmol/L   CO2 18 (L) 22 - 32 mmol/L   Glucose, Bld 152 (H) 65 - 99 mg/dL   BUN 16 6 - 20 mg/dL   Creatinine, Ser 0.83 0.61 - 1.24 mg/dL   Calcium 7.2 (L) 8.9 - 10.3 mg/dL   GFR calc non Af Amer >60 >60 mL/min   GFR calc Af Amer >60 >60 mL/min    Comment: (NOTE) The eGFR has been calculated using the CKD EPI equation. This calculation has not been validated in all clinical situations. eGFR's persistently <60 mL/min signify possible  Chronic Kidney Disease.    Anion gap 7 5 - 15  Protime-INR     Status: None   Collection Time: 11/02/15  2:10 AM  Result Value Ref Range   Prothrombin Time 14.8 11.6 - 15.2 seconds   INR 1.14 0.00 - 1.49  Magnesium     Status: Abnormal   Collection Time: 11/02/15  2:10 AM  Result Value Ref Range   Magnesium 1.6 (L) 1.7 - 2.4 mg/dL  Glucose, capillary     Status: Abnormal   Collection Time: 11/02/15  4:13 AM  Result Value Ref Range   Glucose-Capillary 137 (H) 65 - 99 mg/dL   Comment 1 Notify RN   Glucose, capillary     Status: Abnormal   Collection Time: 11/02/15  7:40 AM  Result Value Ref Range   Glucose-Capillary 110 (H) 65 - 99 mg/dL  Troponin I     Status: None   Collection Time: 11/02/15  8:33 AM  Result Value Ref Range   Troponin I <0.03 <0.031 ng/mL    Comment:        NO INDICATION OF MYOCARDIAL INJURY.   CBC     Status: Abnormal   Collection Time: 11/02/15  8:33 AM  Result Value Ref Range   WBC 8.7 4.0 - 10.5 K/uL   RBC 2.99 (L) 4.22 - 5.81 MIL/uL   Hemoglobin 9.0 (L) 13.0 - 17.0 g/dL    Comment: REPEATED TO VERIFY DELTA CHECK NOTED    HCT 26.1 (L) 39.0 - 52.0 %   MCV 87.3 78.0 - 100.0 fL   MCH 30.1 26.0 - 34.0  pg   MCHC 34.5 30.0 - 36.0 g/dL   RDW 15.8 (H) 11.5 - 15.5 %   Platelets 146 (L) 150 - 400 K/uL  Glucose, capillary     Status: Abnormal   Collection Time: 11/02/15 11:13 AM  Result Value Ref Range   Glucose-Capillary 115 (H) 65 - 99 mg/dL    Ir Angiogram Visceral Selective  11/02/2015  INDICATION: Bleeding duodenal ulcer with hemodynamic instability. Please perform mesenteric arteriogram and potential percutaneous embolization. EXAM: 1. ULTRASOUND GUIDANCE FOR ARTERIAL ACCESS 2. CELIAC AND SUPERIOR MESENTERIC ARTERIOGRAM (1st ORDER) 3. GASTRODUODENAL ARTERIOGRAM (3rd ORDER) AND PERCUTANEOUS COIL EMBOLIZATION COMPARISON:  None MEDICATIONS: None CONTRAST:  85 cc Omnipaque 300 ANESTHESIA/SEDATION: Fentanyl 50 mcg IV; Versed 1 mg IV Total Moderate  Sedation Time 45 minutes FLUOROSCOPY TIME:  11 minutes 18 seconds (6,063 mGy) COMPLICATIONS: None immediate ACCESS: Right common femoral artery; hemostasis achieved with manual compression. TECHNIQUE: Informed written consent was obtained from the patient after a discussion of the risks, benefits and alternatives to treatment. Questions regarding the procedure were encouraged and answered. A timeout was performed prior to the initiation of the procedure. The right groin was prepped and draped in the usual sterile fashion, and a sterile drape was applied covering the operative field. Maximum barrier sterile technique with sterile gowns and gloves were used for the procedure. A timeout was performed prior to the initiation of the procedure. Local anesthesia was provided with 1% lidocaine. The right femoral head was marked fluoroscopically. Under ultrasound guidance, the right common femoral artery was accessed with a micropuncture kit after the overlying soft tissues were anesthetized with 1% lidocaine. An ultrasound image was saved for documentation purposes. The micropuncture sheath was exchanged for a 5 [place] vascular sheath over a Bentson wire. A closure arteriogram was performed through the side of the sheath confirming access within the right common femoral artery. Over a Bentson wire, a Mickelson catheter was advanced to the level of the thoracic aorta where it was back bled and flushed. The catheter was then utilized to select the celiac artery and a celiac arteriogram was performed. Utilizing a Fathom 14 microwire, a regular renegade micro catheter was utilized to select the gastroduodenal artery and a gastroduodenal arteriogram was performed. The gastroduodenal artery was percutaneously coiled embolized to near its origin. Post embolization arteriogram demonstrates complete occlusion of the gastroduodenal artery. The Mickelson catheter was then utilized to select the superior mesenteric artery and a  superior mesenteric arteriogram was performed. Images reviewed and the procedure was terminated. All wires and catheters and sheaths were removed from the patient. Hemostasis was achieved at the right groin and access site with manual compression. A dressing was placed. The patient tolerated procedure well without immediate postprocedural complication. The patient was escorted to nuclear medicine department for planar imaging. 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 Otherwise, conventional branching pattern. No discrete areas of vessel irregularity or active extravasation are seen with special attention paid to the duodenal bulb. Sub selective gastroduodenal arteriogram demonstrates mild focal narrowing within the distal aspect of the GDA, again without discrete area of vessel irregularity or active extravasation. The gastroduodenal artery was successfully percutaneous coil embolized from its distal aspect to near its origin. Completion celiac arteriogram demonstrates complete occlusion of the GDA. Superior mesenteric arteriogram demonstrates a replaced hepatic artery arising from the proximal SMA. There is no definitive hypertrophied supply to the  duodenum or duodenal bulb. IMPRESSION: Technically successful prophylactic embolization of the gastroduodenal artery for bleeding duodenal ulcer. Electronically Signed   By: Sandi Mariscal M.D.   On: 11/02/2015 03:04   Ir Angiogram Visceral Selective  11/02/2015  INDICATION: Bleeding duodenal ulcer with hemodynamic instability. Please perform mesenteric arteriogram and potential percutaneous embolization. EXAM: 1. ULTRASOUND GUIDANCE FOR ARTERIAL ACCESS 2. CELIAC AND SUPERIOR MESENTERIC ARTERIOGRAM (1st ORDER) 3. GASTRODUODENAL ARTERIOGRAM (3rd ORDER) AND PERCUTANEOUS COIL EMBOLIZATION COMPARISON:  None MEDICATIONS: None CONTRAST:  85 cc  Omnipaque 300 ANESTHESIA/SEDATION: Fentanyl 50 mcg IV; Versed 1 mg IV Total Moderate Sedation Time 45 minutes FLUOROSCOPY TIME:  11 minutes 18 seconds (8,333 mGy) COMPLICATIONS: None immediate ACCESS: Right common femoral artery; hemostasis achieved with manual compression. TECHNIQUE: Informed written consent was obtained from the patient after a discussion of the risks, benefits and alternatives to treatment. Questions regarding the procedure were encouraged and answered. A timeout was performed prior to the initiation of the procedure. The right groin was prepped and draped in the usual sterile fashion, and a sterile drape was applied covering the operative field. Maximum barrier sterile technique with sterile gowns and gloves were used for the procedure. A timeout was performed prior to the initiation of the procedure. Local anesthesia was provided with 1% lidocaine. The right femoral head was marked fluoroscopically. Under ultrasound guidance, the right common femoral artery was accessed with a micropuncture kit after the overlying soft tissues were anesthetized with 1% lidocaine. An ultrasound image was saved for documentation purposes. The micropuncture sheath was exchanged for a 5 [place] vascular sheath over a Bentson wire. A closure arteriogram was performed through the side of the sheath confirming access within the right common femoral artery. Over a Bentson wire, a Mickelson catheter was advanced to the level of the thoracic aorta where it was back bled and flushed. The catheter was then utilized to select the celiac artery and a celiac arteriogram was performed. Utilizing a Fathom 14 microwire, a regular renegade micro catheter was utilized to select the gastroduodenal artery and a gastroduodenal arteriogram was performed. The gastroduodenal artery was percutaneously coiled embolized to near its origin. Post embolization arteriogram demonstrates complete occlusion of the gastroduodenal artery. The  Mickelson catheter was then utilized to select the superior mesenteric artery and a superior mesenteric arteriogram was performed. Images reviewed and the procedure was terminated. All wires and catheters and sheaths were removed from the patient. Hemostasis was achieved at the right groin and access site with manual compression. A dressing was placed. The patient tolerated procedure well without immediate postprocedural complication. The patient was escorted to nuclear medicine department for planar imaging. 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 Otherwise, conventional branching pattern. No discrete areas of vessel irregularity or active extravasation are seen with special attention paid to the duodenal bulb. Sub selective gastroduodenal arteriogram demonstrates mild focal narrowing within the distal aspect of the GDA, again without discrete area of vessel irregularity or active extravasation. The gastroduodenal artery was successfully percutaneous coil embolized from its distal aspect to near its origin. Completion celiac arteriogram demonstrates complete occlusion of the GDA. Superior mesenteric arteriogram demonstrates a replaced hepatic artery arising from the proximal SMA. There is no definitive hypertrophied supply to the duodenum or duodenal bulb. IMPRESSION: Technically successful prophylactic embolization of the gastroduodenal artery for bleeding duodenal ulcer. Electronically Signed   By: Eldridge Abrahams.D.  On: 11/02/2015 03:04   Ir Angiogram Selective Each Additional Vessel  11/02/2015  INDICATION: Bleeding duodenal ulcer with hemodynamic instability. Please perform mesenteric arteriogram and potential percutaneous embolization. EXAM: 1. ULTRASOUND GUIDANCE FOR ARTERIAL ACCESS 2. CELIAC AND SUPERIOR MESENTERIC ARTERIOGRAM (1st ORDER) 3. GASTRODUODENAL ARTERIOGRAM  (3rd ORDER) AND PERCUTANEOUS COIL EMBOLIZATION COMPARISON:  None MEDICATIONS: None CONTRAST:  85 cc Omnipaque 300 ANESTHESIA/SEDATION: Fentanyl 50 mcg IV; Versed 1 mg IV Total Moderate Sedation Time 45 minutes FLUOROSCOPY TIME:  11 minutes 18 seconds (3,419 mGy) COMPLICATIONS: None immediate ACCESS: Right common femoral artery; hemostasis achieved with manual compression. TECHNIQUE: Informed written consent was obtained from the patient after a discussion of the risks, benefits and alternatives to treatment. Questions regarding the procedure were encouraged and answered. A timeout was performed prior to the initiation of the procedure. The right groin was prepped and draped in the usual sterile fashion, and a sterile drape was applied covering the operative field. Maximum barrier sterile technique with sterile gowns and gloves were used for the procedure. A timeout was performed prior to the initiation of the procedure. Local anesthesia was provided with 1% lidocaine. The right femoral head was marked fluoroscopically. Under ultrasound guidance, the right common femoral artery was accessed with a micropuncture kit after the overlying soft tissues were anesthetized with 1% lidocaine. An ultrasound image was saved for documentation purposes. The micropuncture sheath was exchanged for a 5 [place] vascular sheath over a Bentson wire. A closure arteriogram was performed through the side of the sheath confirming access within the right common femoral artery. Over a Bentson wire, a Mickelson catheter was advanced to the level of the thoracic aorta where it was back bled and flushed. The catheter was then utilized to select the celiac artery and a celiac arteriogram was performed. Utilizing a Fathom 14 microwire, a regular renegade micro catheter was utilized to select the gastroduodenal artery and a gastroduodenal arteriogram was performed. The gastroduodenal artery was percutaneously coiled embolized to near its origin.  Post embolization arteriogram demonstrates complete occlusion of the gastroduodenal artery. The Mickelson catheter was then utilized to select the superior mesenteric artery and a superior mesenteric arteriogram was performed. Images reviewed and the procedure was terminated. All wires and catheters and sheaths were removed from the patient. Hemostasis was achieved at the right groin and access site with manual compression. A dressing was placed. The patient tolerated procedure well without immediate postprocedural complication. The patient was escorted to nuclear medicine department for planar imaging. 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 Otherwise, conventional branching pattern. No discrete areas of vessel irregularity or active extravasation are seen with special attention paid to the duodenal bulb. Sub selective gastroduodenal arteriogram demonstrates mild focal narrowing within the distal aspect of the GDA, again without discrete area of vessel irregularity or active extravasation. The gastroduodenal artery was successfully percutaneous coil embolized from its distal aspect to near its origin. Completion celiac arteriogram demonstrates complete occlusion of the GDA. Superior mesenteric arteriogram demonstrates a replaced hepatic artery arising from the proximal SMA. There is no definitive hypertrophied supply to the duodenum or duodenal bulb. IMPRESSION: Technically successful prophylactic embolization of the gastroduodenal artery for bleeding duodenal ulcer. Electronically Signed   By: Sandi Mariscal M.D.   On: 11/02/2015 03:04   Ir US Guide Vasc Access Right  11/02/2015  INDICATION: Bleeding duodenal ulcer with hemodynamic instability. Please perform mesenteric arteriogram  and potential percutaneous embolization. EXAM: 1. ULTRASOUND GUIDANCE FOR ARTERIAL ACCESS 2. CELIAC  AND SUPERIOR MESENTERIC ARTERIOGRAM (1st ORDER) 3. GASTRODUODENAL ARTERIOGRAM (3rd ORDER) AND PERCUTANEOUS COIL EMBOLIZATION COMPARISON:  None MEDICATIONS: None CONTRAST:  85 cc Omnipaque 300 ANESTHESIA/SEDATION: Fentanyl 50 mcg IV; Versed 1 mg IV Total Moderate Sedation Time 45 minutes FLUOROSCOPY TIME:  11 minutes 18 seconds (2,330 mGy) COMPLICATIONS: None immediate ACCESS: Right common femoral artery; hemostasis achieved with manual compression. TECHNIQUE: Informed written consent was obtained from the patient after a discussion of the risks, benefits and alternatives to treatment. Questions regarding the procedure were encouraged and answered. A timeout was performed prior to the initiation of the procedure. The right groin was prepped and draped in the usual sterile fashion, and a sterile drape was applied covering the operative field. Maximum barrier sterile technique with sterile gowns and gloves were used for the procedure. A timeout was performed prior to the initiation of the procedure. Local anesthesia was provided with 1% lidocaine. The right femoral head was marked fluoroscopically. Under ultrasound guidance, the right common femoral artery was accessed with a micropuncture kit after the overlying soft tissues were anesthetized with 1% lidocaine. An ultrasound image was saved for documentation purposes. The micropuncture sheath was exchanged for a 5 [place] vascular sheath over a Bentson wire. A closure arteriogram was performed through the side of the sheath confirming access within the right common femoral artery. Over a Bentson wire, a Mickelson catheter was advanced to the level of the thoracic aorta where it was back bled and flushed. The catheter was then utilized to select the celiac artery and a celiac arteriogram was performed. Utilizing a Fathom 14 microwire, a regular renegade micro catheter was utilized to select the gastroduodenal artery and a gastroduodenal arteriogram was performed. The  gastroduodenal artery was percutaneously coiled embolized to near its origin. Post embolization arteriogram demonstrates complete occlusion of the gastroduodenal artery. The Mickelson catheter was then utilized to select the superior mesenteric artery and a superior mesenteric arteriogram was performed. Images reviewed and the procedure was terminated. All wires and catheters and sheaths were removed from the patient. Hemostasis was achieved at the right groin and access site with manual compression. A dressing was placed. The patient tolerated procedure well without immediate postprocedural complication. The patient was escorted to nuclear medicine department for planar imaging. 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 Otherwise, conventional branching pattern. No discrete areas of vessel irregularity or active extravasation are seen with special attention paid to the duodenal bulb. Sub selective gastroduodenal arteriogram demonstrates mild focal narrowing within the distal aspect of the GDA, again without discrete area of vessel irregularity or active extravasation. The gastroduodenal artery was successfully percutaneous coil embolized from its distal aspect to near its origin. Completion celiac arteriogram demonstrates complete occlusion of the GDA. Superior mesenteric arteriogram demonstrates a replaced hepatic artery arising from the proximal SMA. There is no definitive hypertrophied supply to the duodenum or duodenal bulb. IMPRESSION: Technically successful prophylactic embolization of the gastroduodenal artery for bleeding duodenal ulcer. Electronically Signed   By: Sandi Mariscal M.D.   On: 11/02/2015 03:04   Dg Abd Acute W/chest  11/01/2015  CLINICAL DATA:  Perforated duodenal ulcer with hemorrhage (HCC) K26.6 (ICD-10-CM) EXAM: DG ABDOMEN ACUTE W/ 1V CHEST COMPARISON:  None.  FINDINGS: There is no bowel dilation, but there scattered air-fluid levels on the decubitus view. No  convincing free intraperitoneal air. No evidence of renal or ureteral stones. Soft tissues are unremarkable. Chest radiograph shows unremarkable heart, mediastinum and hila and clear lungs. A ventriculoperitoneal shunt crosses anterior chest extending into the right upper quadrant of the abdomen. IMPRESSION: 1. No convincing free air on the decubitus view. 2. No evidence of bowel obstruction. 3. Scattered air-fluid levels on the decubitus view suggests an adynamic ileus. 4. No active disease of the chest. Electronically Signed   By: Lajean Manes M.D.   On: 11/01/2015 18:11   [place] [facility] Roadmapping  11/02/2015  INDICATION: Bleeding duodenal ulcer with hemodynamic instability. Please perform mesenteric arteriogram and potential percutaneous embolization. EXAM: 1. ULTRASOUND GUIDANCE FOR ARTERIAL ACCESS 2. CELIAC AND SUPERIOR MESENTERIC ARTERIOGRAM (1st ORDER) 3. GASTRODUODENAL ARTERIOGRAM (3rd ORDER) AND PERCUTANEOUS COIL EMBOLIZATION COMPARISON:  None MEDICATIONS: None CONTRAST:  85 cc Omnipaque 300 ANESTHESIA/SEDATION: Fentanyl 50 mcg IV; Versed 1 mg IV Total Moderate Sedation Time 45 minutes FLUOROSCOPY TIME:  11 minutes 18 seconds (6,568 mGy) COMPLICATIONS: None immediate ACCESS: Right common femoral artery; hemostasis achieved with manual compression. TECHNIQUE: Informed written consent was obtained from the patient after a discussion of the risks, benefits and alternatives to treatment. Questions regarding the procedure were encouraged and answered. A timeout was performed prior to the initiation of the procedure. The right groin was prepped and draped in the usual sterile fashion, and a sterile drape was applied covering the operative field. Maximum barrier sterile technique with sterile gowns and gloves were used for the procedure. A timeout was performed prior to the  initiation of the procedure. Local anesthesia was provided with 1% lidocaine. The right femoral head was marked fluoroscopically. Under ultrasound guidance, the right common femoral artery was accessed with a micropuncture kit after the overlying soft tissues were anesthetized with 1% lidocaine. An ultrasound image was saved for documentation purposes. The micropuncture sheath was exchanged for a 5 [place] vascular sheath over a Bentson wire. A closure arteriogram was performed through the side of the sheath confirming access within the right common femoral artery. Over a Bentson wire, a Mickelson catheter was advanced to the level of the thoracic aorta where it was back bled and flushed. The catheter was then utilized to select the celiac artery and a celiac arteriogram was performed. Utilizing a Fathom 14 microwire, a regular renegade micro catheter was utilized to select the gastroduodenal artery and a gastroduodenal arteriogram was performed. The gastroduodenal artery was percutaneously coiled embolized to near its origin. Post embolization arteriogram demonstrates complete occlusion of the gastroduodenal artery. The Mickelson catheter was then utilized to select the superior mesenteric artery and a superior mesenteric arteriogram was performed. Images reviewed and the procedure was terminated. All wires and catheters and sheaths were removed from the patient. Hemostasis was achieved at the right groin and access site with manual compression. A dressing was placed. The patient tolerated procedure well without immediate postprocedural complication. The patient was escorted to nuclear medicine department for planar imaging. 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 Otherwise, conventional branching pattern. No discrete areas of vessel irregularity or active extravasation are seen  with special attention paid to the duodenal bulb. Sub selective gastroduodenal arteriogram demonstrates mild focal narrowing within the distal aspect of the GDA, again without discrete area of vessel irregularity or active extravasation. The gastroduodenal artery was successfully percutaneous  coil embolized from its distal aspect to near its origin. Completion celiac arteriogram demonstrates complete occlusion of the GDA. Superior mesenteric arteriogram demonstrates a replaced hepatic artery arising from the proximal SMA. There is no definitive hypertrophied supply to the duodenum or duodenal bulb. IMPRESSION: Technically successful prophylactic embolization of the gastroduodenal artery for bleeding duodenal ulcer. Electronically Signed   By: Sandi Mariscal M.D.   On: 11/02/2015 03:04    Review of Systems  Constitutional: Negative.   HENT: Negative.   Eyes: Negative.   Respiratory: Negative.   Cardiovascular: Negative.   Gastrointestinal: Positive for blood in stool.  Genitourinary: Negative.   Musculoskeletal: Negative.   Skin: Negative.   Neurological: Negative.   Endo/Heme/Allergies: Negative.   Psychiatric/Behavioral: Negative.    Blood pressure 169/87, pulse 90, temperature 97.6 F (36.4 C), temperature source Oral, resp. rate 18, height 5' 9"  (1.753 m), weight 84.3 kg (185 lb 13.6 oz), SpO2 100 %. Physical Exam  Constitutional: He is oriented to person, place, and time. He appears well-developed and well-nourished.  HENT:  Head: Normocephalic and atraumatic.  Eyes: Conjunctivae and EOM are normal. Pupils are equal, round, and reactive to light.  Neck: Normal range of motion. Neck supple.  Cardiovascular: Regular rhythm and normal heart sounds.   tachy  Respiratory: Effort normal and breath sounds normal.  GI: Soft. Bowel sounds are normal. There is no tenderness.  Musculoskeletal: Normal range of motion.  Neurological: He is alert and oriented to person, place, and time.  Skin: Skin  is warm and dry.  Psychiatric: He has a normal mood and affect.  Cooperative. History of TBI    Assessment/Plan: The patient has a GI bleed from a duodenal ulcer. He has stabilized after embolization. CCM will continue to resuscitate and will monitor serial hg. Will follow closely with you  TOTH III,Pessy Delamar S 11/02/2015, 1:01 PM

## 2015-11-02 NOTE — Progress Notes (Signed)
Progress Note   Zachary LoosenScott Barile ZOX:[id number] DOB: 04/07/1966 DOA: 10/29/2015 PCP: Ezequiel KayserPERINI,[name], MD   Brief Narrative:    49 y.o. male the PMH of TBI admit 10/29/15 syncopal episode at church with brief loss of consciousness.  EMS was called and the patient was brought to the ED for further evaluation where he was found to be hypotensive with a hemoglobin of 6.4. Rectal exam revealed Hemoccult positive stools.  While in the ED, the patient had hematemesis and a large black stool. GI subsequently was consulted. EGD performed 11/7 = post bulbar duodenal ulcer Noted to have further drop in hemoglobin underwent repeat endoscopy 11/9 showing clean-based G ulceration large duodenal ulcer with visible bleeding and bruising which was treated with cautery and epinephrine Patient had large amounts of dark stool subsequent to that he was transferred to ICU Critical care, interventional radiology, general surgery were consulted Patient underwent coil embolization of gastroduodenal artery for bleeding ulcer under interventional 11/9  Assessment/Plan:     Syncope and collapse secondary to duodenal ulcer hemorrhage with hypotension/Acute blood loss anemia  - Status post 8 units of blood, aggressive IV fluids with stabilization of blood pressure. - Monitoring H&H Q 12 hours.  - EGD done by GI 10/30/15. Large ulcer found distal to the duodenal bulb with an adherent clot.  -further ? in Hb noted 11/01/15 am -Rpt EGD showed Clean basd GEJ ulceration + Large duodenal ulcer + oozing Rx Goldprobe + EPI and hemostasis -Further large bleed 11/9 necessitated coil embolization of GDA by IR -Patient transferred to ICU transiently and now SDU status - Continue PPI as infusions for right now - NPO unless GI states otherwise    Urinary retention - Continues to have difficulty voiding.  -Continue I&O cath Q 6 hours PRN. - Mobilize.   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  - Continue Tegretol and Lexapro.  Impaired glucose tolerance -hold off coverage for right now  Hypokalemia -resolved with replcement  DVT prophylaxis - SCDs.   Family Communication/Anticipated D/C date and plan/Code Status   Family Communication: Mother updated bedside. Disposition Plan: Home when hemodynamically stable and hemoglobin stable 48 hours. Anticipated D/C date:   ? Code Status: Full    Code Status Orders        Start     Ordered   10/29/15 1856  Full code   Continuous     10/29/15 1855       IV Access:    Peripheral IV   Procedures and diagnostic studies:   EGD 10/30/15 EGD 11/01/15 IR GDA embolization 11/01/15  Medical Consultants:    Gastroenterology: Napoleon FormKavitha Nandigam V, MD  Anti-Infectives:   Anti-infectives    None      Subjective:   Feels much better No overt bleeding noted Thirsty No n/v reported   Objective:    Filed Vitals:   11/02/15 0600 11/02/15 0700 11/02/15 0730 11/02/15 0800  BP: 140/89 147/106 137/62 138/84  Pulse: 103 115 95 92  Temp:    97.5 F (36.4 C)  TempSrc:    Oral  Resp: 18 19 17 17   Height:      Weight:      SpO2: 99% 100% 99% 100%    Intake/Output Summary (Last 24 hours) at 11/02/15 0851 Last data filed at 11/02/15 0700  Gross per 24 hour  Intake 4964.83 ml  Output  750 ml  Net 4214.83 ml   Filed Weights   10/29/15 1858 10/31/15 0354  Weight: 83.4 kg (183 lb 13.8 oz) 84.3 kg (185 lb 13.6 oz)    Exam: Gen:  NAD, color improved Cardiovascular:  RRR, No M/R/G Respiratory:  Lungs CTAB Gastrointestinal:  Abdomen soft, NT/ND, + BS.  No rebound no guard, Extremities:  No C/E/C   Data Reviewed:    Labs: Basic Metabolic Panel:  Recent Labs Lab 10/29/15 1316 11/01/15 1658 11/01/15 2050 11/02/15 0210  NA 138 141 141 139  K 4.3 3.3* 3.9 4.0  CL  107 117* 118* 114*  CO2 26 20* 20* 18*  GLUCOSE 130* 144* 165* 152*  BUN CREATININE 0.89 0.93 0.88 0.83  CALCIUM 8.7* 6.6* 6.6* 7.2*  MG  --   --   --  1.6*   GFR Estimated Creatinine Clearance: 107.7 mL/min (by C-G formula based on Cr of 0.83). Liver Function Tests:  Recent Labs Lab 10/29/15 1930 11/01/15 1658  AST 12* 15  ALT 10* 9*  ALKPHOS 44 34*  BILITOT 0.8 0.4  PROT 4.4* 3.1*  ALBUMIN 2.1* 1.6*   Coagulation profile  Recent Labs Lab 10/29/15 1930 11/01/15 2050 11/02/15 0210  INR 1.25 1.32 1.14    CBC:  Recent Labs Lab 10/29/15 1316  10/31/15 1250  11/01/15 0407 11/01/15 1100 11/01/15 1658 11/01/15 2050 11/02/15 0210  WBC 8.4  --  6.5  --   --   --  11.2* 11.6* 13.2*  NEUTROABS  --   --   --   --   --   --  9.1*  --   --   HGB 6.4*  < > 8.2*  8.1*  < > 6.6* 6.8* 6.6* 9.3* 11.5*  HCT 20.5*  < > 24.4*  24.1*  < > 19.9* 20.3* 19.8* 27.6* 33.6*  MCV 98.1  --  86.8  --   --   --  90.4 88.2 88.4  PLT 306  --  181  --   --   --  165 141* 141*  < > = values in this interval not displayed. CBG:  Recent Labs Lab 10/29/15 1237 11/01/15 2103 11/01/15 2356 11/02/15 0413 11/02/15 0740  GLUCAP 107* 137* 131* 137* 110*    Microbiology Recent Results (from the past 240 hour(s))  MRSA PCR Screening     Status: None   Collection Time: 10/29/15  6:30 PM  Result Value Ref Range Status   MRSA by PCR NEGATIVE NEGATIVE Final    Comment:        The GeneXpert MRSA Assay (FDA approved for NASAL specimens only), is one component of a comprehensive MRSA colonization surveillance program. It is not intended to diagnose MRSA infection nor to guide or monitor treatment for MRSA infections.      Medications:   . antiseptic oral rinse  7 mL Mouth Rinse BID  . carbamazepine  600 mg Oral BID  . cholecalciferol  1,000 Units Oral Daily  . diphenhydrAMINE  25 mg Intravenous Once  . escitalopram  20 mg Oral Daily  . insulin aspart  0-9 Units  Subcutaneous 6 times per day  . multivitamin with minerals  1 tablet Oral Daily   Continuous Infusions: . sodium chloride 75 mL/hr at 11/01/15 1752  . pantoprozole (PROTONIX) infusion 8 mg/hr (11/01/15 2042)    Time spent: 25 minutes.  Pleas Koch, MD [facility] [phone]   11/02/2015, 8:51 AM

## 2015-11-02 NOTE — Progress Notes (Signed)
[facility] Progress Note  Subjective:  Feeling better.  So far no further sign of active bleeding since angiography with coil embolization of GDA by IR last night.  Now s/p 8 units PRBC's since admission.  Hgb 11.5 grams this AM.  Objective:  Vital signs in last 24 hours: Temp:  [97.3 F (36.3 C)-99 F (37.2 C)] 97.5 F (36.4 C) (11/10 0800) Pulse Rate:  [69-117] 92 (11/10 0800) Resp:  [9-20] 17 (11/10 0800) BP: (69-155)/(28-117) 138/84 mmHg (11/10 0800) SpO2:  [93 %-100 %] 100 % (11/10 0800) Last BM Date: 11/02/15 General:  Alert, Well-developed, in NAD Heart:  Regular rate and rhythm; no murmurs Pulm:  CTAB.  No W/R/R. Abdomen:  Soft, non-distended. Normal bowel sounds.  Non-tender. Extremities:  Without edema. Neurologic:  Alert and  oriented x 4;  grossly normal neurologically.  Intake/Output from previous day: 11/09 0701 - 11/10 0700 In: 4964.8 [P.O.:240; I.V.:2850; Blood:1474.8; IV Piggyback:400] Out: 750 [Urine:750]  Lab Results:  Recent Labs  11/01/15 1658 11/01/15 2050 11/02/15 0210  WBC 11.2* 11.6* 13.2*  HGB 6.6* 9.3* 11.5*  HCT 19.8* 27.6* 33.6*  PLT 165 141* 141*   BMET  Recent Labs  11/01/15 1658 11/01/15 2050 11/02/15 0210  NA 141 141 139  K 3.3* 3.9 4.0  CL 117* 118* 114*  CO2 20* 20* 18*  GLUCOSE 144* 165* 152*  BUN _0 CREATININE 0.93 0.88 0.83  CALCIUM 6.6* 6.6* 7.2*   LFT  Recent Labs  11/01/15 1658  PROT 3.1*  ALBUMIN 1.6*  AST 15  ALT 9*  ALKPHOS 34*  BILITOT 0.4   PT/INR  Recent Labs  11/01/15 2050 11/02/15 0210  LABPROT 16.5* 14.8  INR 1.32 1.14   Ir Angiogram Visceral Selective  11/02/2015  INDICATION: Bleeding duodenal ulcer with hemodynamic instability. Please perform mesenteric arteriogram and potential percutaneous embolization. EXAM: 1. ULTRASOUND GUIDANCE FOR ARTERIAL ACCESS 2. CELIAC AND SUPERIOR MESENTERIC ARTERIOGRAM (1st ORDER) 3. GASTRODUODENAL ARTERIOGRAM (3rd ORDER) AND  PERCUTANEOUS COIL EMBOLIZATION COMPARISON:  None MEDICATIONS: None CONTRAST:  85 cc Omnipaque 300 ANESTHESIA/SEDATION: Fentanyl 50 mcg IV; Versed 1 mg IV Total Moderate Sedation Time 45 minutes FLUOROSCOPY TIME:  11 minutes 18 seconds (0,940 mGy) COMPLICATIONS: None immediate ACCESS: Right common femoral artery; hemostasis achieved with manual compression. TECHNIQUE: Informed written consent was obtained from the patient after a discussion of the risks, benefits and alternatives to treatment. Questions regarding the procedure were encouraged and answered. A timeout was performed prior to the initiation of the procedure. The right groin was prepped and draped in the usual sterile fashion, and a sterile drape was applied covering the operative field. Maximum barrier sterile technique with sterile gowns and gloves were used for the procedure. A timeout was performed prior to the initiation of the procedure. Local anesthesia was provided with 1% lidocaine. The right femoral head was marked fluoroscopically. Under ultrasound guidance, the right common femoral artery was accessed with a micropuncture kit after the overlying soft tissues were anesthetized with 1% lidocaine. An ultrasound image was saved for documentation purposes. The micropuncture sheath was exchanged for a 5 [place] vascular sheath over a Bentson wire. A closure arteriogram was performed through the side of the sheath confirming access within the right common femoral artery. Over a Bentson wire, a Mickelson catheter was advanced to the level of the thoracic aorta where it was back bled and flushed. The catheter was then utilized to select the celiac artery and a celiac arteriogram was  performed. Utilizing a Fathom 14 microwire, a regular renegade micro catheter was utilized to select the gastroduodenal artery and a gastroduodenal arteriogram was performed. The gastroduodenal artery was percutaneously coiled embolized to near its origin. Post embolization  arteriogram demonstrates complete occlusion of the gastroduodenal artery. The Mickelson catheter was then utilized to select the superior mesenteric artery and a superior mesenteric arteriogram was performed. Images reviewed and the procedure was terminated. All wires and catheters and sheaths were removed from the patient. Hemostasis was achieved at the right groin and access site with manual compression. A dressing was placed. The patient tolerated procedure well without immediate postprocedural complication. The patient was escorted to nuclear medicine department for planar imaging. 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 Otherwise, conventional branching pattern. No discrete areas of vessel irregularity or active extravasation are seen with special attention paid to the duodenal bulb. Sub selective gastroduodenal arteriogram demonstrates mild focal narrowing within the distal aspect of the GDA, again without discrete area of vessel irregularity or active extravasation. The gastroduodenal artery was successfully percutaneous coil embolized from its distal aspect to near its origin. Completion celiac arteriogram demonstrates complete occlusion of the GDA. Superior mesenteric arteriogram demonstrates a replaced hepatic artery arising from the proximal SMA. There is no definitive hypertrophied supply to the duodenum or duodenal bulb. IMPRESSION: Technically successful prophylactic embolization of the gastroduodenal artery for bleeding duodenal ulcer. Electronically Signed   By: Sandi Mariscal M.D.   On: 11/02/2015 03:04   Ir Angiogram Visceral Selective  11/02/2015  INDICATION: Bleeding duodenal ulcer with hemodynamic instability. Please perform mesenteric arteriogram and potential percutaneous embolization. EXAM: 1. ULTRASOUND GUIDANCE FOR ARTERIAL ACCESS 2. CELIAC AND SUPERIOR  MESENTERIC ARTERIOGRAM (1st ORDER) 3. GASTRODUODENAL ARTERIOGRAM (3rd ORDER) AND PERCUTANEOUS COIL EMBOLIZATION COMPARISON:  None MEDICATIONS: None CONTRAST:  85 cc Omnipaque 300 ANESTHESIA/SEDATION: Fentanyl 50 mcg IV; Versed 1 mg IV Total Moderate Sedation Time 45 minutes FLUOROSCOPY TIME:  11 minutes 18 seconds (1,308 mGy) COMPLICATIONS: None immediate ACCESS: Right common femoral artery; hemostasis achieved with manual compression. TECHNIQUE: Informed written consent was obtained from the patient after a discussion of the risks, benefits and alternatives to treatment. Questions regarding the procedure were encouraged and answered. A timeout was performed prior to the initiation of the procedure. The right groin was prepped and draped in the usual sterile fashion, and a sterile drape was applied covering the operative field. Maximum barrier sterile technique with sterile gowns and gloves were used for the procedure. A timeout was performed prior to the initiation of the procedure. Local anesthesia was provided with 1% lidocaine. The right femoral head was marked fluoroscopically. Under ultrasound guidance, the right common femoral artery was accessed with a micropuncture kit after the overlying soft tissues were anesthetized with 1% lidocaine. An ultrasound image was saved for documentation purposes. The micropuncture sheath was exchanged for a 5 [place] vascular sheath over a Bentson wire. A closure arteriogram was performed through the side of the sheath confirming access within the right common femoral artery. Over a Bentson wire, a Mickelson catheter was advanced to the level of the thoracic aorta where it was back bled and flushed. The catheter was then utilized to select the celiac artery and a celiac arteriogram was performed. Utilizing a Fathom 14 microwire, a regular renegade micro catheter was utilized to select the gastroduodenal artery and a gastroduodenal arteriogram was performed. The gastroduodenal  artery was percutaneously coiled embolized to near its origin. Post embolization arteriogram demonstrates complete occlusion of the gastroduodenal artery. The Mickelson catheter was then utilized to select the superior mesenteric artery and a superior mesenteric arteriogram was performed. Images reviewed and the procedure was terminated. All wires and catheters and sheaths were removed from the patient. Hemostasis was achieved at the right groin and access site with manual compression. A dressing was placed. The patient tolerated procedure well without immediate postprocedural complication. The patient was escorted to nuclear medicine department for planar imaging. 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 Otherwise, conventional branching pattern. No discrete areas of vessel irregularity or active extravasation are seen with special attention paid to the duodenal bulb. Sub selective gastroduodenal arteriogram demonstrates mild focal narrowing within the distal aspect of the GDA, again without discrete area of vessel irregularity or active extravasation. The gastroduodenal artery was successfully percutaneous coil embolized from its distal aspect to near its origin. Completion celiac arteriogram demonstrates complete occlusion of the GDA. Superior mesenteric arteriogram demonstrates a replaced hepatic artery arising from the proximal SMA. There is no definitive hypertrophied supply to the duodenum or duodenal bulb. IMPRESSION: Technically successful prophylactic embolization of the gastroduodenal artery for bleeding duodenal ulcer. Electronically Signed   By: Sandi Mariscal M.D.   On: 11/02/2015 03:04   Ir Angiogram Selective Each Additional Vessel  11/02/2015  INDICATION: Bleeding duodenal ulcer with hemodynamic instability. Please perform mesenteric arteriogram and potential  percutaneous embolization. EXAM: 1. ULTRASOUND GUIDANCE FOR ARTERIAL ACCESS 2. CELIAC AND SUPERIOR MESENTERIC ARTERIOGRAM (1st ORDER) 3. GASTRODUODENAL ARTERIOGRAM (3rd ORDER) AND PERCUTANEOUS COIL EMBOLIZATION COMPARISON:  None MEDICATIONS: None CONTRAST:  85 cc Omnipaque 300 ANESTHESIA/SEDATION: Fentanyl 50 mcg IV; Versed 1 mg IV Total Moderate Sedation Time 45 minutes FLUOROSCOPY TIME:  11 minutes 18 seconds (2,878 mGy) COMPLICATIONS: None immediate ACCESS: Right common femoral artery; hemostasis achieved with manual compression. TECHNIQUE: Informed written consent was obtained from the patient after a discussion of the risks, benefits and alternatives to treatment. Questions regarding the procedure were encouraged and answered. A timeout was performed prior to the initiation of the procedure. The right groin was prepped and draped in the usual sterile fashion, and a sterile drape was applied covering the operative field. Maximum barrier sterile technique with sterile gowns and gloves were used for the procedure. A timeout was performed prior to the initiation of the procedure. Local anesthesia was provided with 1% lidocaine. The right femoral head was marked fluoroscopically. Under ultrasound guidance, the right common femoral artery was accessed with a micropuncture kit after the overlying soft tissues were anesthetized with 1% lidocaine. An ultrasound image was saved for documentation purposes. The micropuncture sheath was exchanged for a 5 [place] vascular sheath over a Bentson wire. A closure arteriogram was performed through the side of the sheath confirming access within the right common femoral artery. Over a Bentson wire, a Mickelson catheter was advanced to the level of the thoracic aorta where it was back bled and flushed. The catheter was then utilized to select the celiac artery and a celiac arteriogram was performed. Utilizing a Fathom 14 microwire, a regular renegade micro catheter was utilized to  select the gastroduodenal artery and a gastroduodenal arteriogram was performed. The gastroduodenal artery was percutaneously coiled embolized to near its origin. Post embolization arteriogram demonstrates complete occlusion of the gastroduodenal artery. The Mickelson catheter was then utilized  to select the superior mesenteric artery and a superior mesenteric arteriogram was performed. Images reviewed and the procedure was terminated. All wires and catheters and sheaths were removed from the patient. Hemostasis was achieved at the right groin and access site with manual compression. A dressing was placed. The patient tolerated procedure well without immediate postprocedural complication. The patient was escorted to nuclear medicine department for planar imaging. 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 Otherwise, conventional branching pattern. No discrete areas of vessel irregularity or active extravasation are seen with special attention paid to the duodenal bulb. Sub selective gastroduodenal arteriogram demonstrates mild focal narrowing within the distal aspect of the GDA, again without discrete area of vessel irregularity or active extravasation. The gastroduodenal artery was successfully percutaneous coil embolized from its distal aspect to near its origin. Completion celiac arteriogram demonstrates complete occlusion of the GDA. Superior mesenteric arteriogram demonstrates a replaced hepatic artery arising from the proximal SMA. There is no definitive hypertrophied supply to the duodenum or duodenal bulb. IMPRESSION: Technically successful prophylactic embolization of the gastroduodenal artery for bleeding duodenal ulcer. Electronically Signed   By: Sandi Mariscal M.D.   On: 11/02/2015 03:04   Ir US Guide Vasc Access Right  11/02/2015  INDICATION: Bleeding duodenal ulcer with  hemodynamic instability. Please perform mesenteric arteriogram and potential percutaneous embolization. EXAM: 1. ULTRASOUND GUIDANCE FOR ARTERIAL ACCESS 2. CELIAC AND SUPERIOR MESENTERIC ARTERIOGRAM (1st ORDER) 3. GASTRODUODENAL ARTERIOGRAM (3rd ORDER) AND PERCUTANEOUS COIL EMBOLIZATION COMPARISON:  None MEDICATIONS: None CONTRAST:  85 cc Omnipaque 300 ANESTHESIA/SEDATION: Fentanyl 50 mcg IV; Versed 1 mg IV Total Moderate Sedation Time 45 minutes FLUOROSCOPY TIME:  11 minutes 18 seconds (2,952 mGy) COMPLICATIONS: None immediate ACCESS: Right common femoral artery; hemostasis achieved with manual compression. TECHNIQUE: Informed written consent was obtained from the patient after a discussion of the risks, benefits and alternatives to treatment. Questions regarding the procedure were encouraged and answered. A timeout was performed prior to the initiation of the procedure. The right groin was prepped and draped in the usual sterile fashion, and a sterile drape was applied covering the operative field. Maximum barrier sterile technique with sterile gowns and gloves were used for the procedure. A timeout was performed prior to the initiation of the procedure. Local anesthesia was provided with 1% lidocaine. The right femoral head was marked fluoroscopically. Under ultrasound guidance, the right common femoral artery was accessed with a micropuncture kit after the overlying soft tissues were anesthetized with 1% lidocaine. An ultrasound image was saved for documentation purposes. The micropuncture sheath was exchanged for a 5 [place] vascular sheath over a Bentson wire. A closure arteriogram was performed through the side of the sheath confirming access within the right common femoral artery. Over a Bentson wire, a Mickelson catheter was advanced to the level of the thoracic aorta where it was back bled and flushed. The catheter was then utilized to select the celiac artery and a celiac arteriogram was performed.  Utilizing a Fathom 14 microwire, a regular renegade micro catheter was utilized to select the gastroduodenal artery and a gastroduodenal arteriogram was performed. The gastroduodenal artery was percutaneously coiled embolized to near its origin. Post embolization arteriogram demonstrates complete occlusion of the gastroduodenal artery. The Mickelson catheter was then utilized to select the superior mesenteric artery and a superior mesenteric arteriogram was performed. Images reviewed and the procedure was terminated. All wires and catheters and  sheaths were removed from the patient. Hemostasis was achieved at the right groin and access site with manual compression. A dressing was placed. The patient tolerated procedure well without immediate postprocedural complication. The patient was escorted to nuclear medicine department for planar imaging. 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 Otherwise, conventional branching pattern. No discrete areas of vessel irregularity or active extravasation are seen with special attention paid to the duodenal bulb. Sub selective gastroduodenal arteriogram demonstrates mild focal narrowing within the distal aspect of the GDA, again without discrete area of vessel irregularity or active extravasation. The gastroduodenal artery was successfully percutaneous coil embolized from its distal aspect to near its origin. Completion celiac arteriogram demonstrates complete occlusion of the GDA. Superior mesenteric arteriogram demonstrates a replaced hepatic artery arising from the proximal SMA. There is no definitive hypertrophied supply to the duodenum or duodenal bulb. IMPRESSION: Technically successful prophylactic embolization of the gastroduodenal artery for bleeding duodenal ulcer. Electronically Signed   By: Sandi Mariscal M.D.   On: 11/02/2015 03:04   Dg  Abd Acute W/chest  11/01/2015  CLINICAL DATA:  Perforated duodenal ulcer with hemorrhage (HCC) K26.6 (ICD-10-CM) EXAM: DG ABDOMEN ACUTE W/ 1V CHEST COMPARISON:  None. FINDINGS: There is no bowel dilation, but there scattered air-fluid levels on the decubitus view. No convincing free intraperitoneal air. No evidence of renal or ureteral stones. Soft tissues are unremarkable. Chest radiograph shows unremarkable heart, mediastinum and hila and clear lungs. A ventriculoperitoneal shunt crosses anterior chest extending into the right upper quadrant of the abdomen. IMPRESSION: 1. No convincing free air on the decubitus view. 2. No evidence of bowel obstruction. 3. Scattered air-fluid levels on the decubitus view suggests an adynamic ileus. 4. No active disease of the chest. Electronically Signed   By: Lajean Manes M.D.   On: 11/01/2015 18:11   [place] Guide Roadmapping  11/02/2015  INDICATION: Bleeding duodenal ulcer with hemodynamic instability. Please perform mesenteric arteriogram and potential percutaneous embolization. EXAM: 1. ULTRASOUND GUIDANCE FOR ARTERIAL ACCESS 2. CELIAC AND SUPERIOR MESENTERIC ARTERIOGRAM (1st ORDER) 3. GASTRODUODENAL ARTERIOGRAM (3rd ORDER) AND PERCUTANEOUS COIL EMBOLIZATION COMPARISON:  None MEDICATIONS: None CONTRAST:  85 cc Omnipaque 300 ANESTHESIA/SEDATION: Fentanyl 50 mcg IV; Versed 1 mg IV Total Moderate Sedation Time 45 minutes FLUOROSCOPY TIME:  11 minutes 18 seconds (4,627 mGy) COMPLICATIONS: None immediate ACCESS: Right common femoral artery; hemostasis achieved with manual compression. TECHNIQUE: Informed written consent was obtained from the patient after a discussion of the risks, benefits and alternatives to treatment. Questions regarding the procedure were encouraged and answered. A timeout was performed prior to the initiation of the procedure. The right groin was prepped and draped in the usual sterile fashion, and a sterile drape was  applied covering the operative field. Maximum barrier sterile technique with sterile gowns and gloves were used for the procedure. A timeout was performed prior to the initiation of the procedure. Local anesthesia was provided with 1% lidocaine. The right femoral head was marked fluoroscopically. Under ultrasound guidance, the right common femoral artery was accessed with a micropuncture kit after the overlying soft tissues were anesthetized with 1% lidocaine. An ultrasound image was saved for documentation purposes. The micropuncture sheath was exchanged for a 5 [place] vascular sheath over a Bentson wire. A closure arteriogram was performed through the side of the sheath confirming access within the right  common femoral artery. Over a Bentson wire, a Mickelson catheter was advanced to the level of the thoracic aorta where it was back bled and flushed. The catheter was then utilized to select the celiac artery and a celiac arteriogram was performed. Utilizing a Fathom 14 microwire, a regular renegade micro catheter was utilized to select the gastroduodenal artery and a gastroduodenal arteriogram was performed. The gastroduodenal artery was percutaneously coiled embolized to near its origin. Post embolization arteriogram demonstrates complete occlusion of the gastroduodenal artery. The Mickelson catheter was then utilized to select the superior mesenteric artery and a superior mesenteric arteriogram was performed. Images reviewed and the procedure was terminated. All wires and catheters and sheaths were removed from the patient. Hemostasis was achieved at the right groin and access site with manual compression. A dressing was placed. The patient tolerated procedure well without immediate postprocedural complication. The patient was escorted to nuclear medicine department for planar imaging. FINDINGS: Celiac arteriogram demonstrates absence of a right hepatic artery as well as hypertrophied supply of the distal aspect  of the GDA to the right gastroepiploic artery. Otherwise, conventional branching pattern. No discrete areas of vessel irregularity or active extravasation are seen with special attention paid to the duodenal bulb. Sub selective gastroduodenal arteriogram demonstrates mild focal narrowing within the distal aspect of the GDA, again without discrete area of vessel irregularity or active extravasation. The gastroduodenal artery was successfully percutaneous coil embolized from its distal aspect to near its origin. Completion celiac arteriogram demonstrates complete occlusion of the GDA. Superior mesenteric arteriogram demonstrates a replaced hepatic artery arising from the proximal SMA. There is no definitive hypertrophied supply to the duodenum or duodenal bulb. IMPRESSION: Technically successful prophylactic embolization of the gastroduodenal artery for bleeding duodenal ulcer. Electronically Signed   By: Sandi Mariscal M.D.   On: 11/02/2015 03:04   Assessment / Plan: *49 y/o male admitted with UGI bleed. Endoscopy 11/7 showed large duodenal ulcer with adherent clot. No endoscopic intervention was performed. Continued to drop Hgb (from 8.2 grams to 6.4 grams within hours) so EGD was repeat 11/9 with cautery and epi injection.  Patient re-bled profusely a few hours post procedure and underwent angiography with GDA embolization last evening by IR.  He is s/p 8 units PRBC's since admission for acute blood loss anemia.  -Continue PPI gtt.  Monitor Hgb.  Continue only NPO with ice chips for now.   LOS: 4 days   ZEHR, JESSICA D.  11/02/2015, 8:59 AM  Pager number [phone]    Attending physician's note   I have taken an interval history, reviewed the chart and examined the patient. I agree with the Advanced Practitioner's note, impression and recommendations. Duodenal ulcer with visible vessel s/p  Cautery and epi injection with recurrent bleed, s/p GDA embolization by IR with no further episodes of melena and  stable Hgb. Follow up H.pylori and treat if positive. Follow up in office visit   K Denzil Magnuson, MD [phone] Mon-Fri 8a-5p [phone] after 5p, weekends, holidays

## 2015-11-02 NOTE — Progress Notes (Signed)
PULMONARY / CRITICAL CARE MEDICINE   Name: Zachary Thornton MRN: [id number] DOB: [birth year]    ADMISSION DATE:  10/29/2015 CONSULTATION DATE:  11/01/2015  REFERRING MD :  Dr. Verlon Au  CHIEF COMPLAINT:  GIB  INITIAL PRESENTATION: 49 year old male admitted to [facility] 11/6 with acute GI bleeding and blood loss anemia.  Initially admitted by [facility] and EGD revealed duodenal ulcer with clot intact. Recurrent bleeding with EGD again 11/9 showed GE junction ulcer oozing.  Shortly after he had deterioration in BP and passed a large amount of BRBPR. Moved to ICU, PCCM consulted.  Underwent IR arteriogram and embolization of GDA for bleeding duodenal ulcer on 11/9 PM.   STUDIES:  11/7 EGD > Post bulbar duodenal ulcer with blood clot intact 11/9 EGD > Clean based GEJ ulceration. Large duodenal ulcer with visible vessel and active oozing. Treatment with Goldprobe and Epinephrine injection achieved hemostasis.   SIGNIFICANT EVENTS: 11/6 admit for GIB after passing out at church 11/9 Large BRBPR, shock, to ICU 11/9 IR arteriogram and embolization of GDA for bleeding duodenal ulcer   SUBJECTIVE:   Stable overnight.  Few dark, tarry stools this am but no active bleeding noted.  S/p IR embolization.  Asking for something to drink.  Denies abd pain.   VITAL SIGNS: Temp:  [97.3 F (36.3 C)-99 F (37.2 C)] 97.5 F (36.4 C) (11/10 0800) Pulse Rate:  [69-117] 92 (11/10 0800) Resp:  [9-20] 17 (11/10 0800) BP: (69-155)/(28-117) 138/84 mmHg (11/10 0800) SpO2:  [93 %-100 %] 100 % (11/10 0800) HEMODYNAMICS:   VENTILATOR SETTINGS:   INTAKE / OUTPUT:  Intake/Output Summary (Last 24 hours) at 11/02/15 0856 Last data filed at 11/02/15 0700  Gross per 24 hour  Intake 4964.83 ml  Output    750 ml  Net 4214.83 ml    PHYSICAL EXAMINATION: General:  Male of normal body habitus, appears pale, in NAD Neuro:  Alert, flat affect at baseline per primary team HEENT:  Bitemporal depressions. NO appreciable  JVD Cardiovascular:  Borderline tachy, sinus, no MRG Lungs:  resps even non labored on RA, Clear bilateral breath sounds Abdomen:  Soft, non-tender, non-distended Musculoskeletal:  No acute deformity or ROM limitation Skin:  Pallor, Grossly intact  LABS:  CBC  Recent Labs Lab 11/01/15 1658 11/01/15 2050 11/02/15 0210  WBC 11.2* 11.6* 13.2*  HGB 6.6* 9.3* 11.5*  HCT 19.8* 27.6* 33.6*  PLT 165 141* 141*   Coag's  Recent Labs Lab 10/29/15 1930 11/01/15 2050 11/02/15 0210  APTT  --  28  --   INR 1.25 1.32 1.14   BMET  Recent Labs Lab 11/01/15 1658 11/01/15 2050 11/02/15 0210  NA 141 141 139  K 3.3* 3.9 4.0  CL 117* 118* 114*  CO2 20* 20* 18*  BUN 19 18 16   CREATININE 0.93 0.88 0.83  GLUCOSE 144* 165* 152*   Electrolytes  Recent Labs Lab 11/01/15 1658 11/01/15 2050 11/02/15 0210  CALCIUM 6.6* 6.6* 7.2*  MG  --   --  1.6*   Sepsis Markers  Recent Labs Lab 11/01/15 1658 11/01/15 2050  LATICACIDVEN 2.8* 2.2*   ABG No results for input(s): PHART, PCO2ART, PO2ART in the last 168 hours. Liver Enzymes  Recent Labs Lab 10/29/15 1930 11/01/15 1658  AST 12* 15  ALT 10* 9*  ALKPHOS 44 34*  BILITOT 0.8 0.4  ALBUMIN 2.1* 1.6*   Cardiac Enzymes  Recent Labs Lab 11/02/15 0210  TROPONINI <0.03   Glucose  Recent Labs Lab 10/29/15 1237 11/01/15  2103 11/01/15 2356 11/02/15 0413 11/02/15 0740  GLUCAP 107* 137* 131* 137* 110*    Imaging Ir Angiogram Visceral Selective  11/02/2015  INDICATION: Bleeding duodenal ulcer with hemodynamic instability. Please perform mesenteric arteriogram and potential percutaneous embolization. EXAM: 1. ULTRASOUND GUIDANCE FOR ARTERIAL ACCESS 2. CELIAC AND SUPERIOR MESENTERIC ARTERIOGRAM (1st ORDER) 3. GASTRODUODENAL ARTERIOGRAM (3rd ORDER) AND PERCUTANEOUS COIL EMBOLIZATION COMPARISON:  None MEDICATIONS: None CONTRAST:  85 cc Omnipaque 300 ANESTHESIA/SEDATION: Fentanyl 50 mcg IV; Versed 1 mg IV Total Moderate  Sedation Time 45 minutes FLUOROSCOPY TIME:  11 minutes 18 seconds (5,643 mGy) COMPLICATIONS: None immediate ACCESS: Right common femoral artery; hemostasis achieved with manual compression. TECHNIQUE: Informed written consent was obtained from the patient after a discussion of the risks, benefits and alternatives to treatment. Questions regarding the procedure were encouraged and answered. A timeout was performed prior to the initiation of the procedure. The right groin was prepped and draped in the usual sterile fashion, and a sterile drape was applied covering the operative field. Maximum barrier sterile technique with sterile gowns and gloves were used for the procedure. A timeout was performed prior to the initiation of the procedure. Local anesthesia was provided with 1% lidocaine. The right femoral head was marked fluoroscopically. Under ultrasound guidance, the right common femoral artery was accessed with a micropuncture kit after the overlying soft tissues were anesthetized with 1% lidocaine. An ultrasound image was saved for documentation purposes. The micropuncture sheath was exchanged for a 5 [place] vascular sheath over a Bentson wire. A closure arteriogram was performed through the side of the sheath confirming access within the right common femoral artery. Over a Bentson wire, a Mickelson catheter was advanced to the level of the thoracic aorta where it was back bled and flushed. The catheter was then utilized to select the celiac artery and a celiac arteriogram was performed. Utilizing a Fathom 14 microwire, a regular renegade micro catheter was utilized to select the gastroduodenal artery and a gastroduodenal arteriogram was performed. The gastroduodenal artery was percutaneously coiled embolized to near its origin. Post embolization arteriogram demonstrates complete occlusion of the gastroduodenal artery. The Mickelson catheter was then utilized to select the superior mesenteric artery and a  superior mesenteric arteriogram was performed. Images reviewed and the procedure was terminated. All wires and catheters and sheaths were removed from the patient. Hemostasis was achieved at the right groin and access site with manual compression. A dressing was placed. The patient tolerated procedure well without immediate postprocedural complication. The patient was escorted to nuclear medicine department for planar imaging. 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 Otherwise, conventional branching pattern. No discrete areas of vessel irregularity or active extravasation are seen with special attention paid to the duodenal bulb. Sub selective gastroduodenal arteriogram demonstrates mild focal narrowing within the distal aspect of the GDA, again without discrete area of vessel irregularity or active extravasation. The gastroduodenal artery was successfully percutaneous coil embolized from its distal aspect to near its origin. Completion celiac arteriogram demonstrates complete occlusion of the GDA. Superior mesenteric arteriogram demonstrates a replaced hepatic artery arising from the proximal SMA. There is no definitive hypertrophied supply to the duodenum or duodenal bulb. IMPRESSION: Technically successful prophylactic embolization of the gastroduodenal artery for bleeding duodenal ulcer. Electronically Signed   By: Sandi Mariscal M.D.   On: 11/02/2015 03:04   Ir Angiogram Visceral Selective  11/02/2015  INDICATION: Bleeding  duodenal ulcer with hemodynamic instability. Please perform mesenteric arteriogram and potential percutaneous embolization. EXAM: 1. ULTRASOUND GUIDANCE FOR ARTERIAL ACCESS 2. CELIAC AND SUPERIOR MESENTERIC ARTERIOGRAM (1st ORDER) 3. GASTRODUODENAL ARTERIOGRAM (3rd ORDER) AND PERCUTANEOUS COIL EMBOLIZATION COMPARISON:  None MEDICATIONS: None CONTRAST:  85 cc  Omnipaque 300 ANESTHESIA/SEDATION: Fentanyl 50 mcg IV; Versed 1 mg IV Total Moderate Sedation Time 45 minutes FLUOROSCOPY TIME:  11 minutes 18 seconds (8,413 mGy) COMPLICATIONS: None immediate ACCESS: Right common femoral artery; hemostasis achieved with manual compression. TECHNIQUE: Informed written consent was obtained from the patient after a discussion of the risks, benefits and alternatives to treatment. Questions regarding the procedure were encouraged and answered. A timeout was performed prior to the initiation of the procedure. The right groin was prepped and draped in the usual sterile fashion, and a sterile drape was applied covering the operative field. Maximum barrier sterile technique with sterile gowns and gloves were used for the procedure. A timeout was performed prior to the initiation of the procedure. Local anesthesia was provided with 1% lidocaine. The right femoral head was marked fluoroscopically. Under ultrasound guidance, the right common femoral artery was accessed with a micropuncture kit after the overlying soft tissues were anesthetized with 1% lidocaine. An ultrasound image was saved for documentation purposes. The micropuncture sheath was exchanged for a 5 [place] vascular sheath over a Bentson wire. A closure arteriogram was performed through the side of the sheath confirming access within the right common femoral artery. Over a Bentson wire, a Mickelson catheter was advanced to the level of the thoracic aorta where it was back bled and flushed. The catheter was then utilized to select the celiac artery and a celiac arteriogram was performed. Utilizing a Fathom 14 microwire, a regular renegade micro catheter was utilized to select the gastroduodenal artery and a gastroduodenal arteriogram was performed. The gastroduodenal artery was percutaneously coiled embolized to near its origin. Post embolization arteriogram demonstrates complete occlusion of the gastroduodenal artery. The  Mickelson catheter was then utilized to select the superior mesenteric artery and a superior mesenteric arteriogram was performed. Images reviewed and the procedure was terminated. All wires and catheters and sheaths were removed from the patient. Hemostasis was achieved at the right groin and access site with manual compression. A dressing was placed. The patient tolerated procedure well without immediate postprocedural complication. The patient was escorted to nuclear medicine department for planar imaging. 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 Otherwise, conventional branching pattern. No discrete areas of vessel irregularity or active extravasation are seen with special attention paid to the duodenal bulb. Sub selective gastroduodenal arteriogram demonstrates mild focal narrowing within the distal aspect of the GDA, again without discrete area of vessel irregularity or active extravasation. The gastroduodenal artery was successfully percutaneous coil embolized from its distal aspect to near its origin. Completion celiac arteriogram demonstrates complete occlusion of the GDA. Superior mesenteric arteriogram demonstrates a replaced hepatic artery arising from the proximal SMA. There is no definitive hypertrophied supply to the duodenum or duodenal bulb. IMPRESSION: Technically successful prophylactic embolization of the gastroduodenal artery for bleeding duodenal ulcer. Electronically Signed   By: Sandi Mariscal M.D.   On: 11/02/2015 03:04   Ir Angiogram Selective Each Additional Vessel  11/02/2015  INDICATION: Bleeding duodenal ulcer with hemodynamic instability. Please perform mesenteric arteriogram and potential percutaneous embolization. EXAM: 1. ULTRASOUND GUIDANCE FOR ARTERIAL ACCESS 2. CELIAC AND SUPERIOR MESENTERIC  ARTERIOGRAM (1st ORDER) 3. GASTRODUODENAL ARTERIOGRAM  (3rd ORDER) AND PERCUTANEOUS COIL EMBOLIZATION COMPARISON:  None MEDICATIONS: None CONTRAST:  85 cc Omnipaque 300 ANESTHESIA/SEDATION: Fentanyl 50 mcg IV; Versed 1 mg IV Total Moderate Sedation Time 45 minutes FLUOROSCOPY TIME:  11 minutes 18 seconds (9,794 mGy) COMPLICATIONS: None immediate ACCESS: Right common femoral artery; hemostasis achieved with manual compression. TECHNIQUE: Informed written consent was obtained from the patient after a discussion of the risks, benefits and alternatives to treatment. Questions regarding the procedure were encouraged and answered. A timeout was performed prior to the initiation of the procedure. The right groin was prepped and draped in the usual sterile fashion, and a sterile drape was applied covering the operative field. Maximum barrier sterile technique with sterile gowns and gloves were used for the procedure. A timeout was performed prior to the initiation of the procedure. Local anesthesia was provided with 1% lidocaine. The right femoral head was marked fluoroscopically. Under ultrasound guidance, the right common femoral artery was accessed with a micropuncture kit after the overlying soft tissues were anesthetized with 1% lidocaine. An ultrasound image was saved for documentation purposes. The micropuncture sheath was exchanged for a 5 [place] vascular sheath over a Bentson wire. A closure arteriogram was performed through the side of the sheath confirming access within the right common femoral artery. Over a Bentson wire, a Mickelson catheter was advanced to the level of the thoracic aorta where it was back bled and flushed. The catheter was then utilized to select the celiac artery and a celiac arteriogram was performed. Utilizing a Fathom 14 microwire, a regular renegade micro catheter was utilized to select the gastroduodenal artery and a gastroduodenal arteriogram was performed. The gastroduodenal artery was percutaneously coiled embolized to near its origin.  Post embolization arteriogram demonstrates complete occlusion of the gastroduodenal artery. The Mickelson catheter was then utilized to select the superior mesenteric artery and a superior mesenteric arteriogram was performed. Images reviewed and the procedure was terminated. All wires and catheters and sheaths were removed from the patient. Hemostasis was achieved at the right groin and access site with manual compression. A dressing was placed. The patient tolerated procedure well without immediate postprocedural complication. The patient was escorted to nuclear medicine department for planar imaging. 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 Otherwise, conventional branching pattern. No discrete areas of vessel irregularity or active extravasation are seen with special attention paid to the duodenal bulb. Sub selective gastroduodenal arteriogram demonstrates mild focal narrowing within the distal aspect of the GDA, again without discrete area of vessel irregularity or active extravasation. The gastroduodenal artery was successfully percutaneous coil embolized from its distal aspect to near its origin. Completion celiac arteriogram demonstrates complete occlusion of the GDA. Superior mesenteric arteriogram demonstrates a replaced hepatic artery arising from the proximal SMA. There is no definitive hypertrophied supply to the duodenum or duodenal bulb. IMPRESSION: Technically successful prophylactic embolization of the gastroduodenal artery for bleeding duodenal ulcer. Electronically Signed   By: Sandi Mariscal M.D.   On: 11/02/2015 03:04   Ir US Guide Vasc Access Right  11/02/2015  INDICATION: Bleeding duodenal ulcer with hemodynamic instability. Please perform mesenteric arteriogram and potential percutaneous embolization. EXAM: 1. ULTRASOUND GUIDANCE FOR ARTERIAL ACCESS 2. CELIAC  AND SUPERIOR MESENTERIC ARTERIOGRAM (1st ORDER) 3. GASTRODUODENAL ARTERIOGRAM (3rd ORDER) AND PERCUTANEOUS COIL EMBOLIZATION COMPARISON:  None MEDICATIONS: None CONTRAST:  85 cc Omnipaque 300 ANESTHESIA/SEDATION:  Fentanyl 50 mcg IV; Versed 1 mg IV Total Moderate Sedation Time 45 minutes FLUOROSCOPY TIME:  11 minutes 18 seconds (2,703 mGy) COMPLICATIONS: None immediate ACCESS: Right common femoral artery; hemostasis achieved with manual compression. TECHNIQUE: Informed written consent was obtained from the patient after a discussion of the risks, benefits and alternatives to treatment. Questions regarding the procedure were encouraged and answered. A timeout was performed prior to the initiation of the procedure. The right groin was prepped and draped in the usual sterile fashion, and a sterile drape was applied covering the operative field. Maximum barrier sterile technique with sterile gowns and gloves were used for the procedure. A timeout was performed prior to the initiation of the procedure. Local anesthesia was provided with 1% lidocaine. The right femoral head was marked fluoroscopically. Under ultrasound guidance, the right common femoral artery was accessed with a micropuncture kit after the overlying soft tissues were anesthetized with 1% lidocaine. An ultrasound image was saved for documentation purposes. The micropuncture sheath was exchanged for a 5 [place] vascular sheath over a Bentson wire. A closure arteriogram was performed through the side of the sheath confirming access within the right common femoral artery. Over a Bentson wire, a Mickelson catheter was advanced to the level of the thoracic aorta where it was back bled and flushed. The catheter was then utilized to select the celiac artery and a celiac arteriogram was performed. Utilizing a Fathom 14 microwire, a regular renegade micro catheter was utilized to select the gastroduodenal artery and a gastroduodenal arteriogram was performed. The  gastroduodenal artery was percutaneously coiled embolized to near its origin. Post embolization arteriogram demonstrates complete occlusion of the gastroduodenal artery. The Mickelson catheter was then utilized to select the superior mesenteric artery and a superior mesenteric arteriogram was performed. Images reviewed and the procedure was terminated. All wires and catheters and sheaths were removed from the patient. Hemostasis was achieved at the right groin and access site with manual compression. A dressing was placed. The patient tolerated procedure well without immediate postprocedural complication. The patient was escorted to nuclear medicine department for planar imaging. 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 Otherwise, conventional branching pattern. No discrete areas of vessel irregularity or active extravasation are seen with special attention paid to the duodenal bulb. Sub selective gastroduodenal arteriogram demonstrates mild focal narrowing within the distal aspect of the GDA, again without discrete area of vessel irregularity or active extravasation. The gastroduodenal artery was successfully percutaneous coil embolized from its distal aspect to near its origin. Completion celiac arteriogram demonstrates complete occlusion of the GDA. Superior mesenteric arteriogram demonstrates a replaced hepatic artery arising from the proximal SMA. There is no definitive hypertrophied supply to the duodenum or duodenal bulb. IMPRESSION: Technically successful prophylactic embolization of the gastroduodenal artery for bleeding duodenal ulcer. Electronically Signed   By: Sandi Mariscal M.D.   On: 11/02/2015 03:04   Dg Abd Acute W/chest  11/01/2015  CLINICAL DATA:  Perforated duodenal ulcer with hemorrhage (HCC) K26.6 (ICD-10-CM) EXAM: DG ABDOMEN ACUTE W/ 1V CHEST COMPARISON:  None.  FINDINGS: There is no bowel dilation, but there scattered air-fluid levels on the decubitus view. No convincing free intraperitoneal air. No evidence of renal or ureteral stones. Soft tissues are unremarkable. Chest radiograph shows unremarkable heart, mediastinum and hila and clear lungs. A ventriculoperitoneal shunt crosses anterior chest extending into the right upper quadrant of the abdomen.  IMPRESSION: 1. No convincing free air on the decubitus view. 2. No evidence of bowel obstruction. 3. Scattered air-fluid levels on the decubitus view suggests an adynamic ileus. 4. No active disease of the chest. Electronically Signed   By: Lajean Manes M.D.   On: 11/01/2015 18:11   [place] Guide Roadmapping  11/02/2015  INDICATION: Bleeding duodenal ulcer with hemodynamic instability. Please perform mesenteric arteriogram and potential percutaneous embolization. EXAM: 1. ULTRASOUND GUIDANCE FOR ARTERIAL ACCESS 2. CELIAC AND SUPERIOR MESENTERIC ARTERIOGRAM (1st ORDER) 3. GASTRODUODENAL ARTERIOGRAM (3rd ORDER) AND PERCUTANEOUS COIL EMBOLIZATION COMPARISON:  None MEDICATIONS: None CONTRAST:  85 cc Omnipaque 300 ANESTHESIA/SEDATION: Fentanyl 50 mcg IV; Versed 1 mg IV Total Moderate Sedation Time 45 minutes FLUOROSCOPY TIME:  11 minutes 18 seconds (3,748 mGy) COMPLICATIONS: None immediate ACCESS: Right common femoral artery; hemostasis achieved with manual compression. TECHNIQUE: Informed written consent was obtained from the patient after a discussion of the risks, benefits and alternatives to treatment. Questions regarding the procedure were encouraged and answered. A timeout was performed prior to the initiation of the procedure. The right groin was prepped and draped in the usual sterile fashion, and a sterile drape was applied covering the operative field. Maximum barrier sterile technique with sterile gowns and gloves were used for the procedure. A timeout was performed prior to the  initiation of the procedure. Local anesthesia was provided with 1% lidocaine. The right femoral head was marked fluoroscopically. Under ultrasound guidance, the right common femoral artery was accessed with a micropuncture kit after the overlying soft tissues were anesthetized with 1% lidocaine. An ultrasound image was saved for documentation purposes. The micropuncture sheath was exchanged for a 5 [place] vascular sheath over a Bentson wire. A closure arteriogram was performed through the side of the sheath confirming access within the right common femoral artery. Over a Bentson wire, a Mickelson catheter was advanced to the level of the thoracic aorta where it was back bled and flushed. The catheter was then utilized to select the celiac artery and a celiac arteriogram was performed. Utilizing a Fathom 14 microwire, a regular renegade micro catheter was utilized to select the gastroduodenal artery and a gastroduodenal arteriogram was performed. The gastroduodenal artery was percutaneously coiled embolized to near its origin. Post embolization arteriogram demonstrates complete occlusion of the gastroduodenal artery. The Mickelson catheter was then utilized to select the superior mesenteric artery and a superior mesenteric arteriogram was performed. Images reviewed and the procedure was terminated. All wires and catheters and sheaths were removed from the patient. Hemostasis was achieved at the right groin and access site with manual compression. A dressing was placed. The patient tolerated procedure well without immediate postprocedural complication. The patient was escorted to nuclear medicine department for planar imaging. 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 Otherwise, conventional branching pattern. No discrete areas of vessel irregularity or active extravasation are seen  with special attention paid to the duodenal bulb. Sub selective gastroduodenal arteriogram demonstrates mild focal narrowing within the distal aspect of the GDA, again without discrete area of vessel irregularity or active extravasation. The gastroduodenal artery was successfully percutaneous coil embolized from its distal aspect to near its origin. Completion celiac arteriogram demonstrates complete occlusion of the GDA. Superior mesenteric arteriogram demonstrates a replaced hepatic artery arising from the proximal SMA. There is no definitive hypertrophied supply to the duodenum  or duodenal bulb. IMPRESSION: Technically successful prophylactic embolization of the gastroduodenal artery for bleeding duodenal ulcer. Electronically Signed   By: Sandi Mariscal M.D.   On: 11/02/2015 03:04     ASSESSMENT / PLAN:   Hemorrhagic/hypovolemic shock secondary to acute GI bleeding - resolved.  Lactic acidosis  P:  Telemetry monitoring MAP goal > 65 mm/Hg F/u cbc    Hypokalemia Hypocalcemia (corrects to 8.5) hypoMg P:   Mg 2gm IV  Repeat BMP in AM   Acute GI bleeding secondary to duodenal ulcer - resolved post IR embolization   P:   Sips/chips  GI, surgery following  Protonix infusion F/u cbc    Acute blood loss anemia with hemorrhagic shock secondary to GIB  P:  H&H every 6 hours Assess PT/INR   History of TBI Chronic encephalopathy, at baseline  P:   RASS goal: 0 Monitor Continue carbamazepine, escitalopram when can take PO   PCCM signing off, please call back if needed.     Nickolas Madrid, NP 11/02/2015  9:09 AM Pager: [phone] or [phone]

## 2015-11-02 NOTE — Consult Note (Signed)
[facility] Surgery Consult Note  Zachary Thornton April 20, 1966  [id number].    Requesting MD: Dr. Verlon Au Chief Complaint/Reason for Consult: bleeding duodenal ulcer  HPI:  49 y.o. male with a PMH of TBI s/p VP shunt who had a syncopal episode a few weeks ago and a recurrent episode on day of admission. Syncopal episode occurred while at church on 10/29/15. He was unresponsive for less than 1 minutes. EMS was called and the patient was brought to the ED for further evaluation where he was found to be hypotensive with a hemoglobin of 6.4. There is no history of ASA/NSAID use, but recent prednisone use.  No other precipitating or alleviating factors.  No radiating pain.  No significant abdominal pain. The patient has lost weight recently, and has had a diminished appetite, nausea, and dizziness.  He had an episode of coffee ground emesis and heme positive stools in ED.  He was admitted to the hospital for a GI bleed.  He was managed medically with blood transfusions and fluid resuscitation for his hypotension, ABL anemia.    He underwent EGD with Dr. Ardis Hughs on 10/30/15 which showed a post-bulbar duodenal ulcer.  He was recommended to be on a PPI BID for the next 6-8 weeks.  Gastric biopsies were negative for H.Pylori.  Repeat EGD done on 11/01/15 after re-bleeding which found a large DU with visible vessel and active oozing.  Dr. Havery Moros treated with Dorris Carnes and epinephrine injection to achieve hemostasis.  While in endoscopy he had large copious dark bloody stools with clots.  He was transferred to the ICU, additional blood transfusion, and IR was consulted and performed mesenteric angiogram, percutaneous coil embolization of the GDA for bleeding of the duodenal ulcer on 11/01/15.  This was successful.  He has had no further significant bleeding episodes and has been hemodynamically stable.  Hgb 11.5, WBC 13.2, lactic acid improving to 2.2.  We were consulted in case of need of immediate surgical  interventions if he were to re-bleed.    ROS: All systems reviewed and otherwise negative except for as above  Family History  Problem Relation Age of Onset  . Heart disease Father     MVR  . Hypertension Mother     Past Medical History  Diagnosis Date  . TBI (traumatic brain injury) ([place])   . Back injury   . Dementia with behavioral disturbance   . Duodenal ulcer hemorrhage   . GI bleed     Past Surgical History  Procedure Laterality Date  . Csf shunt    . Craniotomy      Post TBI craniotomy and plate insertion  . Esophagogastroduodenoscopy (egd) with propofol N/A 10/30/2015    Procedure: ESOPHAGOGASTRODUODENOSCOPY (EGD) WITH PROPOFOL;  Surgeon: Milus Banister, MD;  Location: [facility] ENDOSCOPY;  Service: Endoscopy;  Laterality: N/A;  . Esophagogastroduodenoscopy N/A 11/01/2015    Procedure: ESOPHAGOGASTRODUODENOSCOPY (EGD);  Surgeon: Manus Gunning, MD;  Location: [facility];  Service: Gastroenterology;  Laterality: N/A;    Social History:  reports that he has quit smoking. He has never used smokeless tobacco. He reports that he does not drink alcohol or use illicit drugs.  Allergies:  Allergies  Allergen Reactions  . Nuedexta [Dextromethorphan-Quinidine] Anaphylaxis    Lips began to swell  . Nsaids Other (See Comments)    Due to GI bleeding    Medications Prior to Admission  Medication Sig Dispense Refill  . carbamazepine (TEGRETOL) 200 MG tablet Take 600 mg by mouth 2 (two) times daily.    [name]  cholecalciferol (VITAMIN D) 1000 UNITS tablet Take 1,000 Units by mouth daily.    [name] escitalopram (LEXAPRO) 20 MG tablet Take 20 mg by mouth daily.    . fish oil-omega-3 fatty acids 1000 MG capsule Take 4 g by mouth 2 (two) times daily.     . Multiple Vitamin (MULTIVITAMIN WITH MINERALS) TABS Take 1 tablet by mouth daily.    . propranolol (INDERAL) 60 MG tablet Take 120 mg by mouth 2 (two) times daily.      Blood pressure 138/84, pulse 92, temperature 97.5 F (36.4 C),  temperature source Oral, resp. rate 17, height 5' 9"  (1.753 m), weight 84.3 kg (185 lb 13.6 oz), SpO2 100 %. Physical Exam: General: pleasant, WD/WN white male who is laying in bed in NAD HEENT: head is normocephalic, atraumatic.  Sclera are noninjected.  PERRL.  Ears and nose without any masses or lesions.  Mouth is pink and moist Heart: regular, rate, and rhythm.  No obvious murmurs, gallops, or rubs noted.  Palpable pedal pulses bilaterally Lungs: CTAB, no wheezes, rhonchi, or rales noted.  Respiratory effort nonlabored Abd: soft, mild distension, NT, +BS, no masses, hernias, or organomegaly, scars periumbilical from shunts MS: all 4 extremities are symmetrical with no cyanosis, clubbing, hand and legs with b/l edema Skin: warm and dry with no masses, lesions, or rashes Psych: A&Ox3 with an appropriate affect.   Results for orders placed or performed during the hospital encounter of 10/29/15 (from the past 48 hour(s))  Hematocrit     Status: Abnormal   Collection Time: 10/31/15 12:50 PM  Result Value Ref Range   HCT 24.1 (L) 39.0 - 52.0 %  Hemoglobin     Status: Abnormal   Collection Time: 10/31/15 12:50 PM  Result Value Ref Range   Hemoglobin 8.1 (L) 13.0 - 17.0 g/dL  CBC Once     Status: Abnormal   Collection Time: 10/31/15 12:50 PM  Result Value Ref Range   WBC 6.5 4.0 - 10.5 K/uL   RBC 2.81 (L) 4.22 - 5.81 MIL/uL   Hemoglobin 8.2 (L) 13.0 - 17.0 g/dL   HCT 24.4 (L) 39.0 - 52.0 %   MCV 86.8 78.0 - 100.0 fL    Comment: REPEATED TO VERIFY DELTA CHECK NOTED POST TRANSFUSION SPECIMEN    MCH 29.2 26.0 - 34.0 pg   MCHC 33.6 30.0 - 36.0 g/dL   RDW 17.9 (H) 11.5 - 15.5 %   Platelets 181 150 - 400 K/uL  Hematocrit     Status: Abnormal   Collection Time: 10/31/15  6:50 PM  Result Value Ref Range   HCT 19.1 (L) 39.0 - 52.0 %  Hemoglobin     Status: Abnormal   Collection Time: 10/31/15  6:50 PM  Result Value Ref Range   Hemoglobin 6.4 (LL) 13.0 - 17.0 g/dL    Comment: RESULT  REPEATED AND VERIFIED DELTA CHECK NOTED CRITICAL RESULT CALLED TO, READ BACK BY AND VERIFIED WITH: Letitia Caul RN @ 2050 ON 10/31/15 BY C DAVIS   Prepare RBC     Status: None   Collection Time: 10/31/15 10:00 PM  Result Value Ref Range   Order Confirmation ORDER PROCESSED BY BLOOD BANK   Hematocrit     Status: Abnormal   Collection Time: 11/01/15  4:07 AM  Result Value Ref Range   HCT 19.9 (L) 39.0 - 52.0 %  Hemoglobin     Status: Abnormal   Collection Time: 11/01/15  4:07 AM  Result Value Ref Range  Hemoglobin 6.6 (LL) 13.0 - 17.0 g/dL    Comment: CRITICAL VALUE NOTED.  VALUE IS CONSISTENT WITH PREVIOUSLY REPORTED AND CALLED VALUE.  Prepare RBC     Status: None   Collection Time: 11/01/15  7:30 AM  Result Value Ref Range   Order Confirmation ORDER PROCESSED BY BLOOD BANK   Hematocrit     Status: Abnormal   Collection Time: 11/01/15 11:00 AM  Result Value Ref Range   HCT 20.3 (L) 39.0 - 52.0 %  Hemoglobin     Status: Abnormal   Collection Time: 11/01/15 11:00 AM  Result Value Ref Range   Hemoglobin 6.8 (LL) 13.0 - 17.0 g/dL    Comment: CRITICAL VALUE NOTED.  VALUE IS CONSISTENT WITH PREVIOUSLY REPORTED AND CALLED VALUE.  Comprehensive metabolic panel     Status: Abnormal   Collection Time: 11/01/15  4:58 PM  Result Value Ref Range   Sodium 141 135 - 145 mmol/L   Potassium 3.3 (L) 3.5 - 5.1 mmol/L   Chloride 117 (H) 101 - 111 mmol/L   CO2 20 (L) 22 - 32 mmol/L   Glucose, Bld 144 (H) 65 - 99 mg/dL   BUN 19 6 - 20 mg/dL   Creatinine, Ser 0.93 0.61 - 1.24 mg/dL   Calcium 6.6 (L) 8.9 - 10.3 mg/dL   Total Protein 3.1 (L) 6.5 - 8.1 g/dL   Albumin 1.6 (L) 3.5 - 5.0 g/dL   AST 15 15 - 41 U/L   ALT 9 (L) 17 - 63 U/L   Alkaline Phosphatase 34 (L) 38 - 126 U/L   Total Bilirubin 0.4 0.3 - 1.2 mg/dL   GFR calc non Af Amer >60 >60 mL/min   GFR calc Af Amer >60 >60 mL/min    Comment: (NOTE) The eGFR has been calculated using the CKD EPI equation. This calculation has not been  validated in all clinical situations. eGFR's persistently <60 mL/min signify possible Chronic Kidney Disease.    Anion gap 4 (L) 5 - 15  Lactic acid, plasma     Status: Abnormal   Collection Time: 11/01/15  4:58 PM  Result Value Ref Range   Lactic Acid, Venous 2.8 (HH) 0.5 - 2.0 mmol/L    Comment: CRITICAL RESULT CALLED TO, READ BACK BY AND VERIFIED WITH: Z.SUGGS RN AT 5035 ON 11.9.16 BY W.BUCHHOLZ.   CBC with Differential/Platelet     Status: Abnormal   Collection Time: 11/01/15  4:58 PM  Result Value Ref Range   WBC 11.2 (H) 4.0 - 10.5 K/uL   RBC 2.19 (L) 4.22 - 5.81 MIL/uL   Hemoglobin 6.6 (LL) 13.0 - 17.0 g/dL    Comment: REPEATED TO VERIFY CRITICAL RESULT CALLED TO, READ BACK BY AND VERIFIED WITH: ZOEY SUGGS RN 11.9.16 @ 1745 BY RICEJ    HCT 19.8 (L) 39.0 - 52.0 %   MCV 90.4 78.0 - 100.0 fL   MCH 30.1 26.0 - 34.0 pg   MCHC 33.3 30.0 - 36.0 g/dL   RDW 16.6 (H) 11.5 - 15.5 %   Platelets 165 150 - 400 K/uL   Neutrophils Relative % 81 %   Lymphocytes Relative 12 %   Monocytes Relative 6 %   Eosinophils Relative 1 %   Basophils Relative 0 %   Neutro Abs 9.1 (H) 1.7 - 7.7 K/uL   Lymphs Abs 1.3 0.7 - 4.0 K/uL   Monocytes Absolute 0.7 0.1 - 1.0 K/uL   Eosinophils Absolute 0.1 0.0 - 0.7 K/uL   Basophils Absolute 0.0  0.0 - 0.1 K/uL   RBC Morphology POLYCHROMASIA PRESENT    Smear Review LARGE PLATELETS PRESENT   Type and screen [place]     Status: None   Collection Time: 11/01/15  4:58 PM  Result Value Ref Range   ABO/RH(D) O NEG    Antibody Screen NEG    Sample Expiration 11/04/2015   Prepare RBC     Status: None   Collection Time: 11/01/15  5:02 PM  Result Value Ref Range   Order Confirmation ORDER PROCESSED BY BLOOD BANK   Lactic acid, plasma     Status: Abnormal   Collection Time: 11/01/15  8:50 PM  Result Value Ref Range   Lactic Acid, Venous 2.2 (HH) 0.5 - 2.0 mmol/L    Comment: CRITICAL RESULT CALLED TO, READ BACK BY AND VERIFIED WITH: Lucila Maine RN 2126 11/01/15 A NAVARRO   Cortisol     Status: None   Collection Time: 11/01/15  8:50 PM  Result Value Ref Range   Cortisol, Plasma 20.8 ug/dL    Comment: (NOTE) AM    6.7 - 22.6 ug/dL PM   <10.0       ug/dL Performed at [facility]   Hemoglobin A1c     Status: Abnormal   Collection Time: 11/01/15  8:50 PM  Result Value Ref Range   Hgb A1c MFr Bld 6.1 (H) 4.8 - 5.6 %    Comment: (NOTE)         Pre-diabetes: 5.7 - 6.4         Diabetes: >6.4         Glycemic control for adults with diabetes: <7.0    Mean Plasma Glucose 128 mg/dL    Comment: (NOTE) Performed At: [facility] [place], [place] [id number] Lindon Romp MD LG:[id number]   Basic metabolic panel     Status: Abnormal   Collection Time: 11/01/15  8:50 PM  Result Value Ref Range   Sodium 141 135 - 145 mmol/L   Potassium 3.9 3.5 - 5.1 mmol/L   Chloride 118 (H) 101 - 111 mmol/L   CO2 20 (L) 22 - 32 mmol/L   Glucose, Bld 165 (H) 65 - 99 mg/dL   BUN 18 6 - 20 mg/dL   Creatinine, Ser 0.88 0.61 - 1.24 mg/dL   Calcium 6.6 (L) 8.9 - 10.3 mg/dL   GFR calc non Af Amer >60 >60 mL/min   GFR calc Af Amer >60 >60 mL/min    Comment: (NOTE) The eGFR has been calculated using the CKD EPI equation. This calculation has not been validated in all clinical situations. eGFR's persistently <60 mL/min signify possible Chronic Kidney Disease.    Anion gap 3 (L) 5 - 15  CBC     Status: Abnormal   Collection Time: 11/01/15  8:50 PM  Result Value Ref Range   WBC 11.6 (H) 4.0 - 10.5 K/uL   RBC 3.13 (L) 4.22 - 5.81 MIL/uL   Hemoglobin 9.3 (L) 13.0 - 17.0 g/dL    Comment: DELTA CHECK NOTED POST TRANSFUSION SPECIMEN    HCT 27.6 (L) 39.0 - 52.0 %   MCV 88.2 78.0 - 100.0 fL   MCH 29.7 26.0 - 34.0 pg   MCHC 33.7 30.0 - 36.0 g/dL   RDW 14.8 11.5 - 15.5 %   Platelets 141 (L) 150 - 400 K/uL  Protime-INR     Status: Abnormal   Collection Time: 11/01/15  8:50 PM  Result Value  Ref Range    Prothrombin Time 16.5 (H) 11.6 - 15.2 seconds   INR 1.32 0.00 - 1.49  APTT     Status: None   Collection Time: 11/01/15  8:50 PM  Result Value Ref Range   aPTT 28 24 - 37 seconds  Glucose, capillary     Status: Abnormal   Collection Time: 11/01/15  9:03 PM  Result Value Ref Range   Glucose-Capillary 137 (H) 65 - 99 mg/dL   Comment 1 Notify RN   Glucose, capillary     Status: Abnormal   Collection Time: 11/01/15 11:56 PM  Result Value Ref Range   Glucose-Capillary 131 (H) 65 - 99 mg/dL   Comment 1 Notify RN   Troponin I     Status: None   Collection Time: 11/02/15  2:10 AM  Result Value Ref Range   Troponin I <0.03 <0.031 ng/mL    Comment:        NO INDICATION OF MYOCARDIAL INJURY.   CBC     Status: Abnormal   Collection Time: 11/02/15  2:10 AM  Result Value Ref Range   WBC 13.2 (H) 4.0 - 10.5 K/uL   RBC 3.80 (L) 4.22 - 5.81 MIL/uL   Hemoglobin 11.5 (L) 13.0 - 17.0 g/dL   HCT 33.6 (L) 39.0 - 52.0 %   MCV 88.4 78.0 - 100.0 fL   MCH 30.3 26.0 - 34.0 pg   MCHC 34.2 30.0 - 36.0 g/dL   RDW 15.4 11.5 - 15.5 %   Platelets 141 (L) 150 - 400 K/uL  Basic metabolic panel     Status: Abnormal   Collection Time: 11/02/15  2:10 AM  Result Value Ref Range   Sodium 139 135 - 145 mmol/L   Potassium 4.0 3.5 - 5.1 mmol/L   Chloride 114 (H) 101 - 111 mmol/L   CO2 18 (L) 22 - 32 mmol/L   Glucose, Bld 152 (H) 65 - 99 mg/dL   BUN 16 6 - 20 mg/dL   Creatinine, Ser 0.83 0.61 - 1.24 mg/dL   Calcium 7.2 (L) 8.9 - 10.3 mg/dL   GFR calc non Af Amer >60 >60 mL/min   GFR calc Af Amer >60 >60 mL/min    Comment: (NOTE) The eGFR has been calculated using the CKD EPI equation. This calculation has not been validated in all clinical situations. eGFR's persistently <60 mL/min signify possible Chronic Kidney Disease.    Anion gap 7 5 - 15  Protime-INR     Status: None   Collection Time: 11/02/15  2:10 AM  Result Value Ref Range   Prothrombin Time 14.8 11.6 - 15.2 seconds   INR 1.14 0.00 - 1.49   Magnesium     Status: Abnormal   Collection Time: 11/02/15  2:10 AM  Result Value Ref Range   Magnesium 1.6 (L) 1.7 - 2.4 mg/dL  Glucose, capillary     Status: Abnormal   Collection Time: 11/02/15  4:13 AM  Result Value Ref Range   Glucose-Capillary 137 (H) 65 - 99 mg/dL   Comment 1 Notify RN   Glucose, capillary     Status: Abnormal   Collection Time: 11/02/15  7:40 AM  Result Value Ref Range   Glucose-Capillary 110 (H) 65 - 99 mg/dL   Ir Angiogram Visceral Selective  11/02/2015  INDICATION: Bleeding duodenal ulcer with hemodynamic instability. Please perform mesenteric arteriogram and potential percutaneous embolization. EXAM: 1. ULTRASOUND GUIDANCE FOR ARTERIAL ACCESS 2. CELIAC AND SUPERIOR MESENTERIC ARTERIOGRAM (1st ORDER) 3.  GASTRODUODENAL ARTERIOGRAM (3rd ORDER) AND PERCUTANEOUS COIL EMBOLIZATION COMPARISON:  None MEDICATIONS: None CONTRAST:  85 cc Omnipaque 300 ANESTHESIA/SEDATION: Fentanyl 50 mcg IV; Versed 1 mg IV Total Moderate Sedation Time 45 minutes FLUOROSCOPY TIME:  11 minutes 18 seconds (5,366 mGy) COMPLICATIONS: None immediate ACCESS: Right common femoral artery; hemostasis achieved with manual compression. TECHNIQUE: Informed written consent was obtained from the patient after a discussion of the risks, benefits and alternatives to treatment. Questions regarding the procedure were encouraged and answered. A timeout was performed prior to the initiation of the procedure. The right groin was prepped and draped in the usual sterile fashion, and a sterile drape was applied covering the operative field. Maximum barrier sterile technique with sterile gowns and gloves were used for the procedure. A timeout was performed prior to the initiation of the procedure. Local anesthesia was provided with 1% lidocaine. The right femoral head was marked fluoroscopically. Under ultrasound guidance, the right common femoral artery was accessed with a micropuncture kit after the overlying soft  tissues were anesthetized with 1% lidocaine. An ultrasound image was saved for documentation purposes. The micropuncture sheath was exchanged for a 5 [place] vascular sheath over a Bentson wire. A closure arteriogram was performed through the side of the sheath confirming access within the right common femoral artery. Over a Bentson wire, a Mickelson catheter was advanced to the level of the thoracic aorta where it was back bled and flushed. The catheter was then utilized to select the celiac artery and a celiac arteriogram was performed. Utilizing a Fathom 14 microwire, a regular renegade micro catheter was utilized to select the gastroduodenal artery and a gastroduodenal arteriogram was performed. The gastroduodenal artery was percutaneously coiled embolized to near its origin. Post embolization arteriogram demonstrates complete occlusion of the gastroduodenal artery. The Mickelson catheter was then utilized to select the superior mesenteric artery and a superior mesenteric arteriogram was performed. Images reviewed and the procedure was terminated. All wires and catheters and sheaths were removed from the patient. Hemostasis was achieved at the right groin and access site with manual compression. A dressing was placed. The patient tolerated procedure well without immediate postprocedural complication. The patient was escorted to nuclear medicine department for planar imaging. 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 Otherwise, conventional branching pattern. No discrete areas of vessel irregularity or active extravasation are seen with special attention paid to the duodenal bulb. Sub selective gastroduodenal arteriogram demonstrates mild focal narrowing within the distal aspect of the GDA, again without discrete area of vessel irregularity or active extravasation. The  gastroduodenal artery was successfully percutaneous coil embolized from its distal aspect to near its origin. Completion celiac arteriogram demonstrates complete occlusion of the GDA. Superior mesenteric arteriogram demonstrates a replaced hepatic artery arising from the proximal SMA. There is no definitive hypertrophied supply to the duodenum or duodenal bulb. IMPRESSION: Technically successful prophylactic embolization of the gastroduodenal artery for bleeding duodenal ulcer. Electronically Signed   By: Sandi Mariscal M.D.   On: 11/02/2015 03:04   Ir Angiogram Visceral Selective  11/02/2015  INDICATION: Bleeding duodenal ulcer with hemodynamic instability. Please perform mesenteric arteriogram and potential percutaneous embolization. EXAM: 1. ULTRASOUND GUIDANCE FOR ARTERIAL ACCESS 2. CELIAC AND SUPERIOR MESENTERIC ARTERIOGRAM (1st ORDER) 3. GASTRODUODENAL ARTERIOGRAM (3rd ORDER) AND PERCUTANEOUS COIL EMBOLIZATION COMPARISON:  None MEDICATIONS: None CONTRAST:  85 cc Omnipaque 300 ANESTHESIA/SEDATION: Fentanyl 50 mcg IV; Versed 1 mg  IV Total Moderate Sedation Time 45 minutes FLUOROSCOPY TIME:  11 minutes 18 seconds (8,270 mGy) COMPLICATIONS: None immediate ACCESS: Right common femoral artery; hemostasis achieved with manual compression. TECHNIQUE: Informed written consent was obtained from the patient after a discussion of the risks, benefits and alternatives to treatment. Questions regarding the procedure were encouraged and answered. A timeout was performed prior to the initiation of the procedure. The right groin was prepped and draped in the usual sterile fashion, and a sterile drape was applied covering the operative field. Maximum barrier sterile technique with sterile gowns and gloves were used for the procedure. A timeout was performed prior to the initiation of the procedure. Local anesthesia was provided with 1% lidocaine. The right femoral head was marked fluoroscopically. Under ultrasound guidance, the  right common femoral artery was accessed with a micropuncture kit after the overlying soft tissues were anesthetized with 1% lidocaine. An ultrasound image was saved for documentation purposes. The micropuncture sheath was exchanged for a 5 [place] vascular sheath over a Bentson wire. A closure arteriogram was performed through the side of the sheath confirming access within the right common femoral artery. Over a Bentson wire, a Mickelson catheter was advanced to the level of the thoracic aorta where it was back bled and flushed. The catheter was then utilized to select the celiac artery and a celiac arteriogram was performed. Utilizing a Fathom 14 microwire, a regular renegade micro catheter was utilized to select the gastroduodenal artery and a gastroduodenal arteriogram was performed. The gastroduodenal artery was percutaneously coiled embolized to near its origin. Post embolization arteriogram demonstrates complete occlusion of the gastroduodenal artery. The Mickelson catheter was then utilized to select the superior mesenteric artery and a superior mesenteric arteriogram was performed. Images reviewed and the procedure was terminated. All wires and catheters and sheaths were removed from the patient. Hemostasis was achieved at the right groin and access site with manual compression. A dressing was placed. The patient tolerated procedure well without immediate postprocedural complication. The patient was escorted to nuclear medicine department for planar imaging. 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 Otherwise, conventional branching pattern. No discrete areas of vessel irregularity or active extravasation are seen with special attention paid to the duodenal bulb. Sub selective gastroduodenal arteriogram demonstrates mild focal narrowing within the distal aspect of the GDA,  again without discrete area of vessel irregularity or active extravasation. The gastroduodenal artery was successfully percutaneous coil embolized from its distal aspect to near its origin. Completion celiac arteriogram demonstrates complete occlusion of the GDA. Superior mesenteric arteriogram demonstrates a replaced hepatic artery arising from the proximal SMA. There is no definitive hypertrophied supply to the duodenum or duodenal bulb. IMPRESSION: Technically successful prophylactic embolization of the gastroduodenal artery for bleeding duodenal ulcer. Electronically Signed   By: Sandi Mariscal M.D.   On: 11/02/2015 03:04   Ir Angiogram Selective Each Additional Vessel  11/02/2015  INDICATION: Bleeding duodenal ulcer with hemodynamic instability. Please perform mesenteric arteriogram and potential percutaneous embolization. EXAM: 1. ULTRASOUND GUIDANCE FOR ARTERIAL ACCESS 2. CELIAC AND SUPERIOR MESENTERIC ARTERIOGRAM (1st ORDER) 3. GASTRODUODENAL ARTERIOGRAM (3rd ORDER) AND PERCUTANEOUS COIL EMBOLIZATION COMPARISON:  None MEDICATIONS: None CONTRAST:  85 cc Omnipaque 300 ANESTHESIA/SEDATION: Fentanyl 50 mcg IV; Versed 1 mg IV Total Moderate Sedation Time 45 minutes FLUOROSCOPY TIME:  11 minutes 18 seconds (7,867 mGy) COMPLICATIONS: None immediate ACCESS: Right common femoral artery; hemostasis  achieved with manual compression. TECHNIQUE: Informed written consent was obtained from the patient after a discussion of the risks, benefits and alternatives to treatment. Questions regarding the procedure were encouraged and answered. A timeout was performed prior to the initiation of the procedure. The right groin was prepped and draped in the usual sterile fashion, and a sterile drape was applied covering the operative field. Maximum barrier sterile technique with sterile gowns and gloves were used for the procedure. A timeout was performed prior to the initiation of the procedure. Local anesthesia was provided with 1%  lidocaine. The right femoral head was marked fluoroscopically. Under ultrasound guidance, the right common femoral artery was accessed with a micropuncture kit after the overlying soft tissues were anesthetized with 1% lidocaine. An ultrasound image was saved for documentation purposes. The micropuncture sheath was exchanged for a 5 [place] vascular sheath over a Bentson wire. A closure arteriogram was performed through the side of the sheath confirming access within the right common femoral artery. Over a Bentson wire, a Mickelson catheter was advanced to the level of the thoracic aorta where it was back bled and flushed. The catheter was then utilized to select the celiac artery and a celiac arteriogram was performed. Utilizing a Fathom 14 microwire, a regular renegade micro catheter was utilized to select the gastroduodenal artery and a gastroduodenal arteriogram was performed. The gastroduodenal artery was percutaneously coiled embolized to near its origin. Post embolization arteriogram demonstrates complete occlusion of the gastroduodenal artery. The Mickelson catheter was then utilized to select the superior mesenteric artery and a superior mesenteric arteriogram was performed. Images reviewed and the procedure was terminated. All wires and catheters and sheaths were removed from the patient. Hemostasis was achieved at the right groin and access site with manual compression. A dressing was placed. The patient tolerated procedure well without immediate postprocedural complication. The patient was escorted to nuclear medicine department for planar imaging. 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 Otherwise, conventional branching pattern. No discrete areas of vessel irregularity or active extravasation are seen with special attention paid to the duodenal bulb. Sub selective  gastroduodenal arteriogram demonstrates mild focal narrowing within the distal aspect of the GDA, again without discrete area of vessel irregularity or active extravasation. The gastroduodenal artery was successfully percutaneous coil embolized from its distal aspect to near its origin. Completion celiac arteriogram demonstrates complete occlusion of the GDA. Superior mesenteric arteriogram demonstrates a replaced hepatic artery arising from the proximal SMA. There is no definitive hypertrophied supply to the duodenum or duodenal bulb. IMPRESSION: Technically successful prophylactic embolization of the gastroduodenal artery for bleeding duodenal ulcer. Electronically Signed   By: Sandi Mariscal M.D.   On: 11/02/2015 03:04   Ir US Guide Vasc Access Right  11/02/2015  INDICATION: Bleeding duodenal ulcer with hemodynamic instability. Please perform mesenteric arteriogram and potential percutaneous embolization. EXAM: 1. ULTRASOUND GUIDANCE FOR ARTERIAL ACCESS 2. CELIAC AND SUPERIOR MESENTERIC ARTERIOGRAM (1st ORDER) 3. GASTRODUODENAL ARTERIOGRAM (3rd ORDER) AND PERCUTANEOUS COIL EMBOLIZATION COMPARISON:  None MEDICATIONS: None CONTRAST:  85 cc Omnipaque 300 ANESTHESIA/SEDATION: Fentanyl 50 mcg IV; Versed 1 mg IV Total Moderate Sedation Time 45 minutes FLUOROSCOPY TIME:  11 minutes 18 seconds (0,092 mGy) COMPLICATIONS: None immediate ACCESS: Right common femoral artery; hemostasis achieved with manual compression. TECHNIQUE: Informed written consent was obtained from the patient after a discussion of the risks, benefits and alternatives to treatment. Questions  regarding the procedure were encouraged and answered. A timeout was performed prior to the initiation of the procedure. The right groin was prepped and draped in the usual sterile fashion, and a sterile drape was applied covering the operative field. Maximum barrier sterile technique with sterile gowns and gloves were used for the procedure. A timeout was  performed prior to the initiation of the procedure. Local anesthesia was provided with 1% lidocaine. The right femoral head was marked fluoroscopically. Under ultrasound guidance, the right common femoral artery was accessed with a micropuncture kit after the overlying soft tissues were anesthetized with 1% lidocaine. An ultrasound image was saved for documentation purposes. The micropuncture sheath was exchanged for a 5 [place] vascular sheath over a Bentson wire. A closure arteriogram was performed through the side of the sheath confirming access within the right common femoral artery. Over a Bentson wire, a Mickelson catheter was advanced to the level of the thoracic aorta where it was back bled and flushed. The catheter was then utilized to select the celiac artery and a celiac arteriogram was performed. Utilizing a Fathom 14 microwire, a regular renegade micro catheter was utilized to select the gastroduodenal artery and a gastroduodenal arteriogram was performed. The gastroduodenal artery was percutaneously coiled embolized to near its origin. Post embolization arteriogram demonstrates complete occlusion of the gastroduodenal artery. The Mickelson catheter was then utilized to select the superior mesenteric artery and a superior mesenteric arteriogram was performed. Images reviewed and the procedure was terminated. All wires and catheters and sheaths were removed from the patient. Hemostasis was achieved at the right groin and access site with manual compression. A dressing was placed. The patient tolerated procedure well without immediate postprocedural complication. The patient was escorted to nuclear medicine department for planar imaging. 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 Otherwise, conventional branching pattern. No discrete areas of vessel irregularity or active  extravasation are seen with special attention paid to the duodenal bulb. Sub selective gastroduodenal arteriogram demonstrates mild focal narrowing within the distal aspect of the GDA, again without discrete area of vessel irregularity or active extravasation. The gastroduodenal artery was successfully percutaneous coil embolized from its distal aspect to near its origin. Completion celiac arteriogram demonstrates complete occlusion of the GDA. Superior mesenteric arteriogram demonstrates a replaced hepatic artery arising from the proximal SMA. There is no definitive hypertrophied supply to the duodenum or duodenal bulb. IMPRESSION: Technically successful prophylactic embolization of the gastroduodenal artery for bleeding duodenal ulcer. Electronically Signed   By: Sandi Mariscal M.D.   On: 11/02/2015 03:04   Dg Abd Acute W/chest  11/01/2015  CLINICAL DATA:  Perforated duodenal ulcer with hemorrhage (HCC) K26.6 (ICD-10-CM) EXAM: DG ABDOMEN ACUTE W/ 1V CHEST COMPARISON:  None. FINDINGS: There is no bowel dilation, but there scattered air-fluid levels on the decubitus view. No convincing free intraperitoneal air. No evidence of renal or ureteral stones. Soft tissues are unremarkable. Chest radiograph shows unremarkable heart, mediastinum and hila and clear lungs. A ventriculoperitoneal shunt crosses anterior chest extending into the right upper quadrant of the abdomen. IMPRESSION: 1. No convincing free air on the decubitus view. 2. No evidence of bowel obstruction. 3. Scattered air-fluid levels on the decubitus view suggests an adynamic ileus. 4. No active disease of the chest. Electronically Signed   By: Lajean Manes M.D.   On: 11/01/2015 18:11   Ir Embo Otilio Saber Hemorr  [place] Guide [employer]  11/02/2015  INDICATION: Bleeding duodenal ulcer with hemodynamic instability. Please perform mesenteric arteriogram and potential percutaneous embolization. EXAM: 1. ULTRASOUND GUIDANCE FOR ARTERIAL ACCESS 2.  CELIAC AND SUPERIOR MESENTERIC ARTERIOGRAM (1st ORDER) 3. GASTRODUODENAL ARTERIOGRAM (3rd ORDER) AND PERCUTANEOUS COIL EMBOLIZATION COMPARISON:  None MEDICATIONS: None CONTRAST:  85 cc Omnipaque 300 ANESTHESIA/SEDATION: Fentanyl 50 mcg IV; Versed 1 mg IV Total Moderate Sedation Time 45 minutes FLUOROSCOPY TIME:  11 minutes 18 seconds (4,163 mGy) COMPLICATIONS: None immediate ACCESS: Right common femoral artery; hemostasis achieved with manual compression. TECHNIQUE: Informed written consent was obtained from the patient after a discussion of the risks, benefits and alternatives to treatment. Questions regarding the procedure were encouraged and answered. A timeout was performed prior to the initiation of the procedure. The right groin was prepped and draped in the usual sterile fashion, and a sterile drape was applied covering the operative field. Maximum barrier sterile technique with sterile gowns and gloves were used for the procedure. A timeout was performed prior to the initiation of the procedure. Local anesthesia was provided with 1% lidocaine. The right femoral head was marked fluoroscopically. Under ultrasound guidance, the right common femoral artery was accessed with a micropuncture kit after the overlying soft tissues were anesthetized with 1% lidocaine. An ultrasound image was saved for documentation purposes. The micropuncture sheath was exchanged for a 5 [place] vascular sheath over a Bentson wire. A closure arteriogram was performed through the side of the sheath confirming access within the right common femoral artery. Over a Bentson wire, a Mickelson catheter was advanced to the level of the thoracic aorta where it was back bled and flushed. The catheter was then utilized to select the celiac artery and a celiac arteriogram was performed. Utilizing a Fathom 14 microwire, a regular renegade micro catheter was utilized to select the gastroduodenal artery and a gastroduodenal arteriogram was performed.  The gastroduodenal artery was percutaneously coiled embolized to near its origin. Post embolization arteriogram demonstrates complete occlusion of the gastroduodenal artery. The Mickelson catheter was then utilized to select the superior mesenteric artery and a superior mesenteric arteriogram was performed. Images reviewed and the procedure was terminated. All wires and catheters and sheaths were removed from the patient. Hemostasis was achieved at the right groin and access site with manual compression. A dressing was placed. The patient tolerated procedure well without immediate postprocedural complication. The patient was escorted to nuclear medicine department for planar imaging. 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 Otherwise, conventional branching pattern. No discrete areas of vessel irregularity or active extravasation are seen with special attention paid to the duodenal bulb. Sub selective gastroduodenal arteriogram demonstrates mild focal narrowing within the distal aspect of the GDA, again without discrete area of vessel irregularity or active extravasation. The gastroduodenal artery was successfully percutaneous coil embolized from its distal aspect to near its origin. Completion celiac arteriogram demonstrates complete occlusion of the GDA. Superior mesenteric arteriogram demonstrates a replaced hepatic artery arising from the proximal SMA. There is no definitive hypertrophied supply to the duodenum or duodenal bulb. IMPRESSION: Technically successful prophylactic embolization of the gastroduodenal artery for bleeding duodenal ulcer. Electronically Signed   By: Sandi Mariscal M.D.   On: 11/02/2015 03:04      Assessment/Plan Upper GI bleed 2* Duodenal bulb ulcer -NPO except ice chips until we know his Hgb has stabilized -Hold blood thinners  due to current  bleed -Hopefully he will not need surgical interventions, this is the last resort.  Angio and endoscopy are first line to stop bleeding.   -Will be available if surgery was indicated -Eventually work towards mobilization and ambulation  ABL Anemia/hypovolemic shock/hypotension - improved H/o TBI s/p VP shunt Dementia with behavioral [place], [facility] 11/02/2015, 8:37 AM Pager: [phone]

## 2015-11-02 NOTE — Progress Notes (Signed)
  Date: November10, 2016 Chart reviewed for concurrent status and case management needs. Will continue to follow patient for changes and needs: hyptensive and continues to receive bld products has rec'd 11 units of prbc to date. Marcelle Smilinghonda Quanta Robertshaw, RN, BSN, [place]   [phone]

## 2015-11-02 NOTE — Plan of Care (Signed)
Problem: Safety: Goal: Ability to remain free from injury will improve Outcome: Progressing No falls noted at this time. Pt reeducated on not getting oob without assistance and use of call bell reinforced. Pt verbalized understanding.

## 2015-11-03 DIAGNOSIS — K264 Chronic or unspecified duodenal ulcer with hemorrhage: Secondary | ICD-10-CM | POA: Diagnosis present

## 2015-11-03 LAB — HEMOGLOBIN
HEMOGLOBIN: 7.2 g/dL — AB (ref 13.0–17.0)
HEMOGLOBIN: 6.9 g/dL — AB (ref 13.0–17.0)
Hemoglobin: 7.3 g/dL — ABNORMAL LOW (ref 13.0–17.0)

## 2015-11-03 LAB — BASIC METABOLIC PANEL
ANION GAP: 3 — AB (ref 5–15)
BUN: 11 mg/dL (ref 6–20)
CHLORIDE: 115 mmol/L — AB (ref 101–111)
CO2: 22 mmol/L (ref 22–32)
Calcium: 7.2 mg/dL — ABNORMAL LOW (ref 8.9–10.3)
Creatinine, Ser: 0.59 mg/dL — ABNORMAL LOW (ref 0.61–1.24)
GFR calc Af Amer: >60 mL/min (ref >60)
GLUCOSE: 105 mg/dL — AB (ref 65–99)
POTASSIUM: 3.4 mmol/L — AB (ref 3.5–5.1)
Sodium: 140 mmol/L (ref 135–145)

## 2015-11-03 LAB — HEMATOCRIT
HCT: 21.6 % — ABNORMAL LOW (ref 39.0–52.0)
HEMATOCRIT: 21.6 % — AB (ref 39.0–52.0)
HEMATOCRIT: 20.4 % — AB (ref 39.0–52.0)

## 2015-11-03 LAB — GLUCOSE, CAPILLARY
GLUCOSE-CAPILLARY: 107 mg/dL — AB (ref 65–99)
GLUCOSE-CAPILLARY: 102 mg/dL — AB (ref 65–99)
GLUCOSE-CAPILLARY: 106 mg/dL — AB (ref 65–99)
GLUCOSE-CAPILLARY: 90 mg/dL (ref 65–99)
Glucose-Capillary: 89 mg/dL (ref 65–99)

## 2015-11-03 LAB — CBC
HEMATOCRIT: 22 % — AB (ref 39.0–52.0)
HEMOGLOBIN: 7.6 g/dL — AB (ref 13.0–17.0)
MCH: 30 pg (ref 26.0–34.0)
MCHC: 34.5 g/dL (ref 30.0–36.0)
MCV: 87 fL (ref 78.0–100.0)
Platelets: 151 10*3/uL (ref 150–400)
RBC: 2.53 MIL/uL — ABNORMAL LOW (ref 4.22–5.81)
RDW: 16.7 % — AB (ref 11.5–15.5)
WBC: 7.3 10*3/uL (ref 4.0–10.5)

## 2015-11-03 LAB — PREPARE RBC (CROSSMATCH)

## 2015-11-03 MED ORDER — SALINE SPRAY 0.65 % NA SOLN
1.0000 | NASAL | Status: DC | PRN
  Administered 2015-11-03: 1 via NASAL
  Filled 2015-11-03: qty 44

## 2015-11-03 MED ORDER — SODIUM CHLORIDE 0.9 % IV SOLN: Freq: Once | INTRAVENOUS | Status: DC

## 2015-11-03 NOTE — Progress Notes (Signed)
Referring Physician(s): Armbruster  Chief Complaint:  Bleeding duodenal ulcer  Subjective:  Zachary Thornton is doing better today. Patient reports no further bleeding.  He has no complaints   Allergies: Nuedexta and Nsaids  Medications: Prior to Admission medications   Medication Sig Start Date End Date Taking? Authorizing Provider  carbamazepine (TEGRETOL) 200 MG tablet Take 600 mg by mouth 2 (two) times daily.   Yes Historical Provider, MD  cholecalciferol (VITAMIN D) 1000 UNITS tablet Take 1,000 Units by mouth daily.   Yes Historical Provider, MD  escitalopram (LEXAPRO) 20 MG tablet Take 20 mg by mouth daily.   Yes Historical Provider, MD  fish oil-omega-3 fatty acids 1000 MG capsule Take 4 g by mouth 2 (two) times daily.    Yes Historical Provider, MD  Multiple Vitamin (MULTIVITAMIN WITH MINERALS) TABS Take 1 tablet by mouth daily.   Yes Historical Provider, MD  propranolol (INDERAL) 60 MG tablet Take 120 mg by mouth 2 (two) times daily.   Yes Historical Provider, MD     Vital Signs: BP 115/69 mmHg  Pulse 91  Temp(Src) 98.9 F (37.2 C) (Oral)  Resp 12  Ht 5' 9"  (1.753 m)  Wt 200 lb 2.8 oz (90.8 kg)  BMI 29.55 kg/m2  SpO2 96%  Physical Exam   Awake and Alert NAD Abdomen soft, NTND Right groin stick looks good. No hematoma or pseudoaneurysm.  Imaging: Ir Angiogram Visceral Selective  11/02/2015  INDICATION: Bleeding duodenal ulcer with hemodynamic instability. Please perform mesenteric arteriogram and potential percutaneous embolization. EXAM: 1. ULTRASOUND GUIDANCE FOR ARTERIAL ACCESS 2. CELIAC AND SUPERIOR MESENTERIC ARTERIOGRAM (1st ORDER) 3. GASTRODUODENAL ARTERIOGRAM (3rd ORDER) AND PERCUTANEOUS COIL EMBOLIZATION COMPARISON:  None MEDICATIONS: None CONTRAST:  85 cc Omnipaque 300 ANESTHESIA/SEDATION: Fentanyl 50 mcg IV; Versed 1 mg IV Total Moderate Sedation Time 45 minutes FLUOROSCOPY TIME:  11 minutes 18 seconds (1,660 mGy) COMPLICATIONS: None immediate  ACCESS: Right common femoral artery; hemostasis achieved with manual compression. TECHNIQUE: Informed written consent was obtained from the patient after a discussion of the risks, benefits and alternatives to treatment. Questions regarding the procedure were encouraged and answered. A timeout was performed prior to the initiation of the procedure. The right groin was prepped and draped in the usual sterile fashion, and a sterile drape was applied covering the operative field. Maximum barrier sterile technique with sterile gowns and gloves were used for the procedure. A timeout was performed prior to the initiation of the procedure. Local anesthesia was provided with 1% lidocaine. The right femoral head was marked fluoroscopically. Under ultrasound guidance, the right common femoral artery was accessed with a micropuncture kit after the overlying soft tissues were anesthetized with 1% lidocaine. An ultrasound image was saved for documentation purposes. The micropuncture sheath was exchanged for a 5 [place] vascular sheath over a Bentson wire. A closure arteriogram was performed through the side of the sheath confirming access within the right common femoral artery. Over a Bentson wire, a Mickelson catheter was advanced to the level of the thoracic aorta where it was back bled and flushed. The catheter was then utilized to select the celiac artery and a celiac arteriogram was performed. Utilizing a Fathom 14 microwire, a regular renegade micro catheter was utilized to select the gastroduodenal artery and a gastroduodenal arteriogram was performed. The gastroduodenal artery was percutaneously coiled embolized to near its origin. Post embolization arteriogram demonstrates complete occlusion of the gastroduodenal artery. The Mickelson catheter was then utilized to select the superior mesenteric artery and a  superior mesenteric arteriogram was performed. Images reviewed and the procedure was terminated. All wires and  catheters and sheaths were removed from the patient. Hemostasis was achieved at the right groin and access site with manual compression. A dressing was placed. The patient tolerated procedure well without immediate postprocedural complication. The patient was escorted to nuclear medicine department for planar imaging. 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 Otherwise, conventional branching pattern. No discrete areas of vessel irregularity or active extravasation are seen with special attention paid to the duodenal bulb. Sub selective gastroduodenal arteriogram demonstrates mild focal narrowing within the distal aspect of the GDA, again without discrete area of vessel irregularity or active extravasation. The gastroduodenal artery was successfully percutaneous coil embolized from its distal aspect to near its origin. Completion celiac arteriogram demonstrates complete occlusion of the GDA. Superior mesenteric arteriogram demonstrates a replaced hepatic artery arising from the proximal SMA. There is no definitive hypertrophied supply to the duodenum or duodenal bulb. IMPRESSION: Technically successful prophylactic embolization of the gastroduodenal artery for bleeding duodenal ulcer. Electronically Signed   By: Sandi Mariscal M.D.   On: 11/02/2015 03:04   Ir Angiogram Visceral Selective  11/02/2015  INDICATION: Bleeding duodenal ulcer with hemodynamic instability. Please perform mesenteric arteriogram and potential percutaneous embolization. EXAM: 1. ULTRASOUND GUIDANCE FOR ARTERIAL ACCESS 2. CELIAC AND SUPERIOR MESENTERIC ARTERIOGRAM (1st ORDER) 3. GASTRODUODENAL ARTERIOGRAM (3rd ORDER) AND PERCUTANEOUS COIL EMBOLIZATION COMPARISON:  None MEDICATIONS: None CONTRAST:  85 cc Omnipaque 300 ANESTHESIA/SEDATION: Fentanyl 50 mcg IV; Versed 1 mg IV Total Moderate Sedation Time 45 minutes  FLUOROSCOPY TIME:  11 minutes 18 seconds (7,829 mGy) COMPLICATIONS: None immediate ACCESS: Right common femoral artery; hemostasis achieved with manual compression. TECHNIQUE: Informed written consent was obtained from the patient after a discussion of the risks, benefits and alternatives to treatment. Questions regarding the procedure were encouraged and answered. A timeout was performed prior to the initiation of the procedure. The right groin was prepped and draped in the usual sterile fashion, and a sterile drape was applied covering the operative field. Maximum barrier sterile technique with sterile gowns and gloves were used for the procedure. A timeout was performed prior to the initiation of the procedure. Local anesthesia was provided with 1% lidocaine. The right femoral head was marked fluoroscopically. Under ultrasound guidance, the right common femoral artery was accessed with a micropuncture kit after the overlying soft tissues were anesthetized with 1% lidocaine. An ultrasound image was saved for documentation purposes. The micropuncture sheath was exchanged for a 5 [place] vascular sheath over a Bentson wire. A closure arteriogram was performed through the side of the sheath confirming access within the right common femoral artery. Over a Bentson wire, a Mickelson catheter was advanced to the level of the thoracic aorta where it was back bled and flushed. The catheter was then utilized to select the celiac artery and a celiac arteriogram was performed. Utilizing a Fathom 14 microwire, a regular renegade micro catheter was utilized to select the gastroduodenal artery and a gastroduodenal arteriogram was performed. The gastroduodenal artery was percutaneously coiled embolized to near its origin. Post embolization arteriogram demonstrates complete occlusion of the gastroduodenal artery. The Mickelson catheter was then utilized to select the superior mesenteric artery and a superior mesenteric arteriogram  was performed. Images reviewed and the procedure was terminated. All wires and catheters and sheaths were removed from the patient. Hemostasis was achieved  at the right groin and access site with manual compression. A dressing was placed. The patient tolerated procedure well without immediate postprocedural complication. The patient was escorted to nuclear medicine department for planar imaging. 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 Otherwise, conventional branching pattern. No discrete areas of vessel irregularity or active extravasation are seen with special attention paid to the duodenal bulb. Sub selective gastroduodenal arteriogram demonstrates mild focal narrowing within the distal aspect of the GDA, again without discrete area of vessel irregularity or active extravasation. The gastroduodenal artery was successfully percutaneous coil embolized from its distal aspect to near its origin. Completion celiac arteriogram demonstrates complete occlusion of the GDA. Superior mesenteric arteriogram demonstrates a replaced hepatic artery arising from the proximal SMA. There is no definitive hypertrophied supply to the duodenum or duodenal bulb. IMPRESSION: Technically successful prophylactic embolization of the gastroduodenal artery for bleeding duodenal ulcer. Electronically Signed   By: Sandi Mariscal M.D.   On: 11/02/2015 03:04   Ir Angiogram Selective Each Additional Vessel  11/02/2015  INDICATION: Bleeding duodenal ulcer with hemodynamic instability. Please perform mesenteric arteriogram and potential percutaneous embolization. EXAM: 1. ULTRASOUND GUIDANCE FOR ARTERIAL ACCESS 2. CELIAC AND SUPERIOR MESENTERIC ARTERIOGRAM (1st ORDER) 3. GASTRODUODENAL ARTERIOGRAM (3rd ORDER) AND PERCUTANEOUS COIL EMBOLIZATION COMPARISON:  None MEDICATIONS: None CONTRAST:  85 cc Omnipaque 300  ANESTHESIA/SEDATION: Fentanyl 50 mcg IV; Versed 1 mg IV Total Moderate Sedation Time 45 minutes FLUOROSCOPY TIME:  11 minutes 18 seconds (7,353 mGy) COMPLICATIONS: None immediate ACCESS: Right common femoral artery; hemostasis achieved with manual compression. TECHNIQUE: Informed written consent was obtained from the patient after a discussion of the risks, benefits and alternatives to treatment. Questions regarding the procedure were encouraged and answered. A timeout was performed prior to the initiation of the procedure. The right groin was prepped and draped in the usual sterile fashion, and a sterile drape was applied covering the operative field. Maximum barrier sterile technique with sterile gowns and gloves were used for the procedure. A timeout was performed prior to the initiation of the procedure. Local anesthesia was provided with 1% lidocaine. The right femoral head was marked fluoroscopically. Under ultrasound guidance, the right common femoral artery was accessed with a micropuncture kit after the overlying soft tissues were anesthetized with 1% lidocaine. An ultrasound image was saved for documentation purposes. The micropuncture sheath was exchanged for a 5 [place] vascular sheath over a Bentson wire. A closure arteriogram was performed through the side of the sheath confirming access within the right common femoral artery. Over a Bentson wire, a Mickelson catheter was advanced to the level of the thoracic aorta where it was back bled and flushed. The catheter was then utilized to select the celiac artery and a celiac arteriogram was performed. Utilizing a Fathom 14 microwire, a regular renegade micro catheter was utilized to select the gastroduodenal artery and a gastroduodenal arteriogram was performed. The gastroduodenal artery was percutaneously coiled embolized to near its origin. Post embolization arteriogram demonstrates complete occlusion of the gastroduodenal artery. The Mickelson catheter  was then utilized to select the superior mesenteric artery and a superior mesenteric arteriogram was performed. Images reviewed and the procedure was terminated. All wires and catheters and sheaths were removed from the patient. Hemostasis was achieved at the right groin and access site with manual compression. A dressing was placed. The patient tolerated procedure well without immediate postprocedural complication. The patient  was escorted to nuclear medicine department for planar imaging. 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 Otherwise, conventional branching pattern. No discrete areas of vessel irregularity or active extravasation are seen with special attention paid to the duodenal bulb. Sub selective gastroduodenal arteriogram demonstrates mild focal narrowing within the distal aspect of the GDA, again without discrete area of vessel irregularity or active extravasation. The gastroduodenal artery was successfully percutaneous coil embolized from its distal aspect to near its origin. Completion celiac arteriogram demonstrates complete occlusion of the GDA. Superior mesenteric arteriogram demonstrates a replaced hepatic artery arising from the proximal SMA. There is no definitive hypertrophied supply to the duodenum or duodenal bulb. IMPRESSION: Technically successful prophylactic embolization of the gastroduodenal artery for bleeding duodenal ulcer. Electronically Signed   By: Sandi Mariscal M.D.   On: 11/02/2015 03:04   Ir US Guide Vasc Access Right  11/02/2015  INDICATION: Bleeding duodenal ulcer with hemodynamic instability. Please perform mesenteric arteriogram and potential percutaneous embolization. EXAM: 1. ULTRASOUND GUIDANCE FOR ARTERIAL ACCESS 2. CELIAC AND SUPERIOR MESENTERIC ARTERIOGRAM (1st ORDER) 3. GASTRODUODENAL ARTERIOGRAM (3rd ORDER) AND PERCUTANEOUS COIL  EMBOLIZATION COMPARISON:  None MEDICATIONS: None CONTRAST:  85 cc Omnipaque 300 ANESTHESIA/SEDATION: Fentanyl 50 mcg IV; Versed 1 mg IV Total Moderate Sedation Time 45 minutes FLUOROSCOPY TIME:  11 minutes 18 seconds (4,076 mGy) COMPLICATIONS: None immediate ACCESS: Right common femoral artery; hemostasis achieved with manual compression. TECHNIQUE: Informed written consent was obtained from the patient after a discussion of the risks, benefits and alternatives to treatment. Questions regarding the procedure were encouraged and answered. A timeout was performed prior to the initiation of the procedure. The right groin was prepped and draped in the usual sterile fashion, and a sterile drape was applied covering the operative field. Maximum barrier sterile technique with sterile gowns and gloves were used for the procedure. A timeout was performed prior to the initiation of the procedure. Local anesthesia was provided with 1% lidocaine. The right femoral head was marked fluoroscopically. Under ultrasound guidance, the right common femoral artery was accessed with a micropuncture kit after the overlying soft tissues were anesthetized with 1% lidocaine. An ultrasound image was saved for documentation purposes. The micropuncture sheath was exchanged for a 5 [place] vascular sheath over a Bentson wire. A closure arteriogram was performed through the side of the sheath confirming access within the right common femoral artery. Over a Bentson wire, a Mickelson catheter was advanced to the level of the thoracic aorta where it was back bled and flushed. The catheter was then utilized to select the celiac artery and a celiac arteriogram was performed. Utilizing a Fathom 14 microwire, a regular renegade micro catheter was utilized to select the gastroduodenal artery and a gastroduodenal arteriogram was performed. The gastroduodenal artery was percutaneously coiled embolized to near its origin. Post embolization arteriogram  demonstrates complete occlusion of the gastroduodenal artery. The Mickelson catheter was then utilized to select the superior mesenteric artery and a superior mesenteric arteriogram was performed. Images reviewed and the procedure was terminated. All wires and catheters and sheaths were removed from the patient. Hemostasis was achieved at the right groin and access site with manual compression. A dressing was placed. The patient tolerated procedure well without immediate postprocedural complication. The patient was escorted to nuclear medicine department for planar imaging. FINDINGS: Celiac arteriogram demonstrates absence of a right hepatic artery as well as hypertrophied supply of  the distal aspect of the GDA to the right gastroepiploic artery. Otherwise, conventional branching pattern. No discrete areas of vessel irregularity or active extravasation are seen with special attention paid to the duodenal bulb. Sub selective gastroduodenal arteriogram demonstrates mild focal narrowing within the distal aspect of the GDA, again without discrete area of vessel irregularity or active extravasation. The gastroduodenal artery was successfully percutaneous coil embolized from its distal aspect to near its origin. Completion celiac arteriogram demonstrates complete occlusion of the GDA. Superior mesenteric arteriogram demonstrates a replaced hepatic artery arising from the proximal SMA. There is no definitive hypertrophied supply to the duodenum or duodenal bulb. IMPRESSION: Technically successful prophylactic embolization of the gastroduodenal artery for bleeding duodenal ulcer. Electronically Signed   By: Sandi Mariscal M.D.   On: 11/02/2015 03:04   Dg Abd Acute W/chest  11/01/2015  CLINICAL DATA:  Perforated duodenal ulcer with hemorrhage (HCC) K26.6 (ICD-10-CM) EXAM: DG ABDOMEN ACUTE W/ 1V CHEST COMPARISON:  None. FINDINGS: There is no bowel dilation, but there scattered air-fluid levels on the decubitus view. No  convincing free intraperitoneal air. No evidence of renal or ureteral stones. Soft tissues are unremarkable. Chest radiograph shows unremarkable heart, mediastinum and hila and clear lungs. A ventriculoperitoneal shunt crosses anterior chest extending into the right upper quadrant of the abdomen. IMPRESSION: 1. No convincing free air on the decubitus view. 2. No evidence of bowel obstruction. 3. Scattered air-fluid levels on the decubitus view suggests an adynamic ileus. 4. No active disease of the chest. Electronically Signed   By: Lajean Manes M.D.   On: 11/01/2015 18:11   [place] Guide [employer]  11/02/2015  INDICATION: Bleeding duodenal ulcer with hemodynamic instability. Please perform mesenteric arteriogram and potential percutaneous embolization. EXAM: 1. ULTRASOUND GUIDANCE FOR ARTERIAL ACCESS 2. CELIAC AND SUPERIOR MESENTERIC ARTERIOGRAM (1st ORDER) 3. GASTRODUODENAL ARTERIOGRAM (3rd ORDER) AND PERCUTANEOUS COIL EMBOLIZATION COMPARISON:  None MEDICATIONS: None CONTRAST:  85 cc Omnipaque 300 ANESTHESIA/SEDATION: Fentanyl 50 mcg IV; Versed 1 mg IV Total Moderate Sedation Time 45 minutes FLUOROSCOPY TIME:  11 minutes 18 seconds (3,614 mGy) COMPLICATIONS: None immediate ACCESS: Right common femoral artery; hemostasis achieved with manual compression. TECHNIQUE: Informed written consent was obtained from the patient after a discussion of the risks, benefits and alternatives to treatment. Questions regarding the procedure were encouraged and answered. A timeout was performed prior to the initiation of the procedure. The right groin was prepped and draped in the usual sterile fashion, and a sterile drape was applied covering the operative field. Maximum barrier sterile technique with sterile gowns and gloves were used for the procedure. A timeout was performed prior to the initiation of the procedure. Local anesthesia was provided with 1% lidocaine. The right femoral head  was marked fluoroscopically. Under ultrasound guidance, the right common femoral artery was accessed with a micropuncture kit after the overlying soft tissues were anesthetized with 1% lidocaine. An ultrasound image was saved for documentation purposes. The micropuncture sheath was exchanged for a 5 [place] vascular sheath over a Bentson wire. A closure arteriogram was performed through the side of the sheath confirming access within the right common femoral artery. Over a Bentson wire, a Mickelson catheter was advanced to the level of the thoracic aorta where it was back bled and flushed. The catheter was then utilized to select the celiac artery and a celiac arteriogram was performed. Utilizing a Fathom 14 microwire, a regular renegade micro catheter was utilized to select the gastroduodenal artery  and a gastroduodenal arteriogram was performed. The gastroduodenal artery was percutaneously coiled embolized to near its origin. Post embolization arteriogram demonstrates complete occlusion of the gastroduodenal artery. The Mickelson catheter was then utilized to select the superior mesenteric artery and a superior mesenteric arteriogram was performed. Images reviewed and the procedure was terminated. All wires and catheters and sheaths were removed from the patient. Hemostasis was achieved at the right groin and access site with manual compression. A dressing was placed. The patient tolerated procedure well without immediate postprocedural complication. The patient was escorted to nuclear medicine department for planar imaging. 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 Otherwise, conventional branching pattern. No discrete areas of vessel irregularity or active extravasation are seen with special attention paid to the duodenal bulb. Sub selective gastroduodenal arteriogram demonstrates  mild focal narrowing within the distal aspect of the GDA, again without discrete area of vessel irregularity or active extravasation. The gastroduodenal artery was successfully percutaneous coil embolized from its distal aspect to near its origin. Completion celiac arteriogram demonstrates complete occlusion of the GDA. Superior mesenteric arteriogram demonstrates a replaced hepatic artery arising from the proximal SMA. There is no definitive hypertrophied supply to the duodenum or duodenal bulb. IMPRESSION: Technically successful prophylactic embolization of the gastroduodenal artery for bleeding duodenal ulcer. Electronically Signed   By: Sandi Mariscal M.D.   On: 11/02/2015 03:04    Labs:  CBC:  Recent Labs  11/02/15 0210 11/02/15 0833 11/02/15 1420 11/03/15 0052 11/03/15 0430 11/03/15 1038  WBC 13.2* 8.7 10.0 7.3  --   --   HGB 11.5* 9.0* 8.7* 7.6* 7.2* 7.3*  HCT 33.6* 26.1* 25.3* 22.0* 21.6* 21.6*  PLT 141* 146* 147* 151  --   --     COAGS:  Recent Labs  10/29/15 1930 11/01/15 2050 11/02/15 0210  INR 1.25 1.32 1.14  APTT  --  28  --     BMP:  Recent Labs  11/01/15 1658 11/01/15 2050 11/02/15 0210 11/03/15 0430  NA 141 141 139 140  K 3.3* 3.9 4.0 3.4*  CL 117* 118* 114* 115*  CO2 20* 20* 18* 22  GLUCOSE 144* 165* 152* 105*  BUN 19 18 16 11   CALCIUM 6.6* 6.6* 7.2* 7.2*  CREATININE 0.93 0.88 0.83 0.59*  GFRNONAA >60 >60 >60 >60  GFRAA >60 >60 >60 >60    LIVER FUNCTION TESTS:  Recent Labs  10/29/15 1930 11/01/15 1658  BILITOT 0.8 0.4  AST 12* 15  ALT 10* 9*  ALKPHOS 44 34*  PROT 4.4* 3.1*  ALBUMIN 2.1* 1.6*    Assessment and Plan:  S/P mesenteric arteriogram and percutaneous coil embolization of the gastroduodenal artery for bleeding duodenal ulcer.  Doing much better.  Will sign off.  Signed: Murrell Redden PA-C 11/03/2015, 12:15 PM   I spent a total of 15 Minutes at the the patient's bedside AND on the patient's hospital floor or unit,  greater than 50% of which was counseling/coordinating care for f/u after embo GDA.

## 2015-11-03 NOTE — Progress Notes (Signed)
Progress Note   Zachary Thornton:[id number] DOB: 05-21-1966 DOA: 10/29/2015 PCP: [name], MD   Brief Narrative:    49 y.o. male the PMH of TBI admit 10/29/15 syncopal episode at church with brief loss of consciousness.  EMS was called and the patient was brought to the ED for further evaluation where he was found to be hypotensive with a hemoglobin of 6.4. Rectal exam revealed Hemoccult positive stools.  While in the ED, the patient had hematemesis and a large black stool. GI subsequently was consulted. EGD performed 11/7 = post bulbar duodenal ulcer Noted to have further drop in hemoglobin underwent repeat endoscopy 11/9 showing clean-based G ulceration large duodenal ulcer with visible bleeding and bruising which was treated with cautery and epinephrine Patient had large amounts of dark stool subsequent to that he was transferred to ICU Critical care, interventional radiology, general surgery were consulted Patient underwent coil embolization of gastroduodenal artery for bleeding ulcer under interventional 11/9  Assessment/Plan:     Syncope and collapse secondary to duodenal ulcer hemorrhage with hypotension/Acute blood loss anemia  - Status post 8 units of blood, aggressive IV fluids with stabilization of blood pressure. - Monitoring H&H Q 12 hours.  - EGD done by GI 10/30/15. Large ulcer found distal to the duodenal bulb with an adherent clot.  -further ? in Hb noted 11/01/15 am -Rpt EGD showed Clean basd GEJ ulceration + Large duodenal ulcer + oozing Rx Goldprobe + EPI and hemostasis -Further large bleed 11/9 necessitated coil embolization of GDA by IR -Patient has been transfused and hemoglobin stabilizing in the 7 range.  No overt bleed however would monitor on step down and diet as well as graduation of diet to be guided by gastroenterology - Continue PPI as infusions for right now    Urinary retention - Continues to have difficulty voiding.  -Continue I&O cath Q 6  hours PRN. - Mobilize.   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  - Continue Tegretol and Lexapro When able to take by mouth  Impaired glucose tolerance -hold off coverage for right now  Hypokalemia -resolved with replcement  DVT prophylaxis - SCDs.   Family Communication/Anticipated D/C date and plan/Code Status   Family Communication: no response on cell # Disposition Plan: Home when hemodynamically stable  Anticipated D/C date:   ? Still unclear-not on dietyet and needs further hospital care Code Status: Full    Code Status Orders        Start     Ordered   10/29/15 1856  Full code   Continuous     10/29/15 1855       IV Access:    Peripheral IV   Procedures and diagnostic studies:   EGD 10/30/15 EGD 11/01/15 IR GDA embolization 11/01/15  Medical Consultants:    Gastroenterology: [name], MD  Anti-Infectives:   Anti-infectives    None      Subjective:   Doing fair. Nursing reports no dark or tarry stools Tolerating nothing by mouth status and this has been recommended by GI No family available at bedside   Objective:    Filed Vitals:   11/03/15 1200 11/03/15 1400 11/03/15 1500 11/03/15 1600  BP: 129/73 129/68    Pulse: 104 102 103   Temp: 98.9 F (37.2 C)   98.4 F (36.9 C)  TempSrc: Oral   Oral  Resp:  18 18 15    Height:      Weight:      SpO2: 97% 98% 99%     Intake/Output Summary (Last 24 hours) at 11/03/15 1631 Last data filed at 11/03/15 1116  Gross per 24 hour  Intake   1725 ml  Output   2425 ml  Net   -700 ml   Filed Weights   10/29/15 1858 10/31/15 0354 11/03/15 0500  Weight: 83.4 kg (183 lb 13.8 oz) 84.3 kg (185 lb 13.6 oz) 90.8 kg (200 lb 2.8 oz)    Exam: Gen:  NAD, color improved Cardiovascular:  RRR, No M/R/G Respiratory:  Lungs  CTAB Gastrointestinal:  Abdomen soft, NT/ND, + BS.  No rebound no guard, Extremities:  No C/E/C   Data Reviewed:    Labs: Basic Metabolic Panel:  Recent Labs Lab 10/29/15 1316 11/01/15 1658 11/01/15 2050 11/02/15 0210 11/03/15 0430  NA 138 141 141 139 140  K 4.3 3.3* 3.9 4.0 3.4*  CL 107 117* 118* 114* 115*  CO2 26 20* 20* 18* 22  GLUCOSE 130* 144* 165* 152* 105*  BUN 20 19 18 16 11   CREATININE 0.89 0.93 0.88 0.83 0.59*  CALCIUM 8.7* 6.6* 6.6* 7.2* 7.2*  MG  --   --   --  1.6*  --    GFR Estimated Creatinine Clearance: 124.3 mL/min (by C-G formula based on Cr of 0.59). Liver Function Tests:  Recent Labs Lab 10/29/15 1930 11/01/15 1658  AST 12* 15  ALT 10* 9*  ALKPHOS 44 34*  BILITOT 0.8 0.4  PROT 4.4* 3.1*  ALBUMIN 2.1* 1.6*   Coagulation profile  Recent Labs Lab 10/29/15 1930 11/01/15 2050 11/02/15 0210  INR 1.25 1.32 1.14    CBC:  Recent Labs Lab 11/01/15 1658 11/01/15 2050 11/02/15 0210 11/02/15 0833 11/02/15 1420 11/03/15 0052 11/03/15 0430 11/03/15 1038  WBC 11.2* 11.6* 13.2* 8.7 10.0 7.3  --   --   NEUTROABS 9.1*  --   --   --   --   --   --   --   HGB 6.6* 9.3* 11.5* 9.0* 8.7* 7.6* 7.2* 7.3*  HCT 19.8* 27.6* 33.6* 26.1* 25.3* 22.0* 21.6* 21.6*  MCV 90.4 88.2 88.4 87.3 87.2 87.0  --   --   PLT 165 141* 141* 146* 147* 151  --   --    CBG:  Recent Labs Lab 11/02/15 2325 11/03/15 0419 11/03/15 0738 11/03/15 1143 11/03/15 1520  GLUCAP 99 107* 102* 106* 90    Microbiology Recent Results (from the past 240 hour(s))  MRSA PCR Screening     Status: None   Collection Time: 10/29/15  6:30 PM  Result Value Ref Range Status   MRSA by PCR NEGATIVE NEGATIVE Final    Comment:        The GeneXpert MRSA Assay (FDA approved for NASAL specimens only), is one component of a comprehensive MRSA colonization surveillance program. It is not intended to diagnose MRSA infection nor to guide or monitor treatment for MRSA infections.       Medications:   . antiseptic oral rinse  7 mL Mouth Rinse BID  . carbamazepine  600 mg Oral BID  . cholecalciferol  1,000 Units Oral Daily  . diphenhydrAMINE  25 mg Intravenous Once  . escitalopram  20 mg Oral Daily  . insulin aspart  0-9 Units Subcutaneous 6 times per day  . multivitamin with minerals  1 tablet Oral Daily   Continuous Infusions: . sodium chloride  75 mL/hr at 11/03/15 1000  . pantoprozole (PROTONIX) infusion 8 mg/hr (11/03/15 1237)    Time spent: 25 minutes.  Pleas Koch, MD [facility] [phone]   11/03/2015, 4:31 PM

## 2015-11-03 NOTE — Plan of Care (Signed)
Problem: Physical Regulation: Goal: Will remain free from infection Outcome: Progressing No s/s of any infection. Staff continue to practice universal precautions to prevent infection.

## 2015-11-03 NOTE — Progress Notes (Signed)
[facility] Progress Note  Subjective:  Hgb 9.0 grams yesterday AM and 7.2 grams this AM.  No sign of bleeding per patient (unreliable) AND nursing staff.  Patient has no complaints.  Objective:  Vital signs in last 24 hours: Temp:  [97.6 F (36.4 C)-99.1 F (37.3 C)] 99.1 F (37.3 C) (11/11 0800) Pulse Rate:  [84-131] 94 (11/11 0600) Resp:  [11-22] 13 (11/11 0600) BP: (107-169)/(48-99) 127/89 mmHg (11/11 0600) SpO2:  [95 %-100 %] 100 % (11/11 0600) Weight:  [200 lb 2.8 oz (90.8 kg)] 200 lb 2.8 oz (90.8 kg) (11/11 0500) Last BM Date: 11/02/15 General:  Alert, Well-developed, in NAD Heart:  Regular rate and rhythm; no murmurs Pulm:  CTAB.  No W/R/R. Abdomen:  Soft, non-distended. Normal bowel sounds.  Non-tender. Neurologic:  Alert and  oriented x 4;  grossly normal neurologically.  Intake/Output from previous day: 11/10 0701 - 11/11 0700 In: 2225 [I.V.:2225] Out: 1450 [Urine:1450]  Lab Results:  Recent Labs  11/02/15 0833 11/02/15 1420 11/03/15 0052 11/03/15 0430  WBC 8.7 10.0 7.3  --   HGB 9.0* 8.7* 7.6* 7.2*  HCT 26.1* 25.3* 22.0* 21.6*  PLT 146* 147* 151  --    BMET  Recent Labs  11/01/15 2050 11/02/15 0210 11/03/15 0430  NA 141 139 140  K 3.9 4.0 3.4*  CL 118* 114* 115*  CO2 20* 18* 22  GLUCOSE 165* 152* 105*  BUN 18 16 11   CREATININE 0.88 0.83 0.59*  CALCIUM 6.6* 7.2* 7.2*   LFT  Recent Labs  11/01/15 1658  PROT 3.1*  ALBUMIN 1.6*  AST 15  ALT 9*  ALKPHOS 34*  BILITOT 0.4   PT/INR  Recent Labs  11/01/15 2050 11/02/15 0210  LABPROT 16.5* 14.8  INR 1.32 1.14   Ir Angiogram Visceral Selective  11/02/2015  INDICATION: Bleeding duodenal ulcer with hemodynamic instability. Please perform mesenteric arteriogram and potential percutaneous embolization. EXAM: 1. ULTRASOUND GUIDANCE FOR ARTERIAL ACCESS 2. CELIAC AND SUPERIOR MESENTERIC ARTERIOGRAM (1st ORDER) 3. GASTRODUODENAL ARTERIOGRAM (3rd ORDER) AND PERCUTANEOUS COIL  EMBOLIZATION COMPARISON:  None MEDICATIONS: None CONTRAST:  85 cc Omnipaque 300 ANESTHESIA/SEDATION: Fentanyl 50 mcg IV; Versed 1 mg IV Total Moderate Sedation Time 45 minutes FLUOROSCOPY TIME:  11 minutes 18 seconds (4,920 mGy) COMPLICATIONS: None immediate ACCESS: Right common femoral artery; hemostasis achieved with manual compression. TECHNIQUE: Informed written consent was obtained from the patient after a discussion of the risks, benefits and alternatives to treatment. Questions regarding the procedure were encouraged and answered. A timeout was performed prior to the initiation of the procedure. The right groin was prepped and draped in the usual sterile fashion, and a sterile drape was applied covering the operative field. Maximum barrier sterile technique with sterile gowns and gloves were used for the procedure. A timeout was performed prior to the initiation of the procedure. Local anesthesia was provided with 1% lidocaine. The right femoral head was marked fluoroscopically. Under ultrasound guidance, the right common femoral artery was accessed with a micropuncture kit after the overlying soft tissues were anesthetized with 1% lidocaine. An ultrasound image was saved for documentation purposes. The micropuncture sheath was exchanged for a 5 [place] vascular sheath over a Bentson wire. A closure arteriogram was performed through the side of the sheath confirming access within the right common femoral artery. Over a Bentson wire, a Mickelson catheter was advanced to the level of the thoracic aorta where it was back bled and flushed. The catheter was then utilized to select  the celiac artery and a celiac arteriogram was performed. Utilizing a Fathom 14 microwire, a regular renegade micro catheter was utilized to select the gastroduodenal artery and a gastroduodenal arteriogram was performed. The gastroduodenal artery was percutaneously coiled embolized to near its origin. Post embolization arteriogram  demonstrates complete occlusion of the gastroduodenal artery. The Mickelson catheter was then utilized to select the superior mesenteric artery and a superior mesenteric arteriogram was performed. Images reviewed and the procedure was terminated. All wires and catheters and sheaths were removed from the patient. Hemostasis was achieved at the right groin and access site with manual compression. A dressing was placed. The patient tolerated procedure well without immediate postprocedural complication. The patient was escorted to nuclear medicine department for planar imaging. 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 Otherwise, conventional branching pattern. No discrete areas of vessel irregularity or active extravasation are seen with special attention paid to the duodenal bulb. Sub selective gastroduodenal arteriogram demonstrates mild focal narrowing within the distal aspect of the GDA, again without discrete area of vessel irregularity or active extravasation. The gastroduodenal artery was successfully percutaneous coil embolized from its distal aspect to near its origin. Completion celiac arteriogram demonstrates complete occlusion of the GDA. Superior mesenteric arteriogram demonstrates a replaced hepatic artery arising from the proximal SMA. There is no definitive hypertrophied supply to the duodenum or duodenal bulb. IMPRESSION: Technically successful prophylactic embolization of the gastroduodenal artery for bleeding duodenal ulcer. Electronically Signed   By: Sandi Mariscal M.D.   On: 11/02/2015 03:04   Ir Angiogram Visceral Selective  11/02/2015  INDICATION: Bleeding duodenal ulcer with hemodynamic instability. Please perform mesenteric arteriogram and potential percutaneous embolization. EXAM: 1. ULTRASOUND GUIDANCE FOR ARTERIAL ACCESS 2. CELIAC AND SUPERIOR MESENTERIC  ARTERIOGRAM (1st ORDER) 3. GASTRODUODENAL ARTERIOGRAM (3rd ORDER) AND PERCUTANEOUS COIL EMBOLIZATION COMPARISON:  None MEDICATIONS: None CONTRAST:  85 cc Omnipaque 300 ANESTHESIA/SEDATION: Fentanyl 50 mcg IV; Versed 1 mg IV Total Moderate Sedation Time 45 minutes FLUOROSCOPY TIME:  11 minutes 18 seconds (7,793 mGy) COMPLICATIONS: None immediate ACCESS: Right common femoral artery; hemostasis achieved with manual compression. TECHNIQUE: Informed written consent was obtained from the patient after a discussion of the risks, benefits and alternatives to treatment. Questions regarding the procedure were encouraged and answered. A timeout was performed prior to the initiation of the procedure. The right groin was prepped and draped in the usual sterile fashion, and a sterile drape was applied covering the operative field. Maximum barrier sterile technique with sterile gowns and gloves were used for the procedure. A timeout was performed prior to the initiation of the procedure. Local anesthesia was provided with 1% lidocaine. The right femoral head was marked fluoroscopically. Under ultrasound guidance, the right common femoral artery was accessed with a micropuncture kit after the overlying soft tissues were anesthetized with 1% lidocaine. An ultrasound image was saved for documentation purposes. The micropuncture sheath was exchanged for a 5 [place] vascular sheath over a Bentson wire. A closure arteriogram was performed through the side of the sheath confirming access within the right common femoral artery. Over a Bentson wire, a Mickelson catheter was advanced to the level of the thoracic aorta where it was back bled and flushed. The catheter was then utilized to select the celiac artery and a celiac arteriogram was performed. Utilizing a Fathom 14 microwire, a regular renegade micro catheter was utilized to select the gastroduodenal artery and  a gastroduodenal arteriogram was performed. The gastroduodenal artery was  percutaneously coiled embolized to near its origin. Post embolization arteriogram demonstrates complete occlusion of the gastroduodenal artery. The Mickelson catheter was then utilized to select the superior mesenteric artery and a superior mesenteric arteriogram was performed. Images reviewed and the procedure was terminated. All wires and catheters and sheaths were removed from the patient. Hemostasis was achieved at the right groin and access site with manual compression. A dressing was placed. The patient tolerated procedure well without immediate postprocedural complication. The patient was escorted to nuclear medicine department for planar imaging. 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 Otherwise, conventional branching pattern. No discrete areas of vessel irregularity or active extravasation are seen with special attention paid to the duodenal bulb. Sub selective gastroduodenal arteriogram demonstrates mild focal narrowing within the distal aspect of the GDA, again without discrete area of vessel irregularity or active extravasation. The gastroduodenal artery was successfully percutaneous coil embolized from its distal aspect to near its origin. Completion celiac arteriogram demonstrates complete occlusion of the GDA. Superior mesenteric arteriogram demonstrates a replaced hepatic artery arising from the proximal SMA. There is no definitive hypertrophied supply to the duodenum or duodenal bulb. IMPRESSION: Technically successful prophylactic embolization of the gastroduodenal artery for bleeding duodenal ulcer. Electronically Signed   By: Sandi Mariscal M.D.   On: 11/02/2015 03:04   Ir Angiogram Selective Each Additional Vessel  11/02/2015  INDICATION: Bleeding duodenal ulcer with hemodynamic instability. Please perform mesenteric arteriogram and potential percutaneous  embolization. EXAM: 1. ULTRASOUND GUIDANCE FOR ARTERIAL ACCESS 2. CELIAC AND SUPERIOR MESENTERIC ARTERIOGRAM (1st ORDER) 3. GASTRODUODENAL ARTERIOGRAM (3rd ORDER) AND PERCUTANEOUS COIL EMBOLIZATION COMPARISON:  None MEDICATIONS: None CONTRAST:  85 cc Omnipaque 300 ANESTHESIA/SEDATION: Fentanyl 50 mcg IV; Versed 1 mg IV Total Moderate Sedation Time 45 minutes FLUOROSCOPY TIME:  11 minutes 18 seconds (9,528 mGy) COMPLICATIONS: None immediate ACCESS: Right common femoral artery; hemostasis achieved with manual compression. TECHNIQUE: Informed written consent was obtained from the patient after a discussion of the risks, benefits and alternatives to treatment. Questions regarding the procedure were encouraged and answered. A timeout was performed prior to the initiation of the procedure. The right groin was prepped and draped in the usual sterile fashion, and a sterile drape was applied covering the operative field. Maximum barrier sterile technique with sterile gowns and gloves were used for the procedure. A timeout was performed prior to the initiation of the procedure. Local anesthesia was provided with 1% lidocaine. The right femoral head was marked fluoroscopically. Under ultrasound guidance, the right common femoral artery was accessed with a micropuncture kit after the overlying soft tissues were anesthetized with 1% lidocaine. An ultrasound image was saved for documentation purposes. The micropuncture sheath was exchanged for a 5 [place] vascular sheath over a Bentson wire. A closure arteriogram was performed through the side of the sheath confirming access within the right common femoral artery. Over a Bentson wire, a Mickelson catheter was advanced to the level of the thoracic aorta where it was back bled and flushed. The catheter was then utilized to select the celiac artery and a celiac arteriogram was performed. Utilizing a Fathom 14 microwire, a regular renegade micro catheter was utilized to select the  gastroduodenal artery and a gastroduodenal arteriogram was performed. The gastroduodenal artery was percutaneously coiled embolized to near its origin. Post embolization arteriogram demonstrates complete occlusion of the  gastroduodenal artery. The Mickelson catheter was then utilized to select the superior mesenteric artery and a superior mesenteric arteriogram was performed. Images reviewed and the procedure was terminated. All wires and catheters and sheaths were removed from the patient. Hemostasis was achieved at the right groin and access site with manual compression. A dressing was placed. The patient tolerated procedure well without immediate postprocedural complication. The patient was escorted to nuclear medicine department for planar imaging. 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 Otherwise, conventional branching pattern. No discrete areas of vessel irregularity or active extravasation are seen with special attention paid to the duodenal bulb. Sub selective gastroduodenal arteriogram demonstrates mild focal narrowing within the distal aspect of the GDA, again without discrete area of vessel irregularity or active extravasation. The gastroduodenal artery was successfully percutaneous coil embolized from its distal aspect to near its origin. Completion celiac arteriogram demonstrates complete occlusion of the GDA. Superior mesenteric arteriogram demonstrates a replaced hepatic artery arising from the proximal SMA. There is no definitive hypertrophied supply to the duodenum or duodenal bulb. IMPRESSION: Technically successful prophylactic embolization of the gastroduodenal artery for bleeding duodenal ulcer. Electronically Signed   By: Sandi Mariscal M.D.   On: 11/02/2015 03:04   Ir US Guide Vasc Access Right  11/02/2015  INDICATION: Bleeding duodenal ulcer with  hemodynamic instability. Please perform mesenteric arteriogram and potential percutaneous embolization. EXAM: 1. ULTRASOUND GUIDANCE FOR ARTERIAL ACCESS 2. CELIAC AND SUPERIOR MESENTERIC ARTERIOGRAM (1st ORDER) 3. GASTRODUODENAL ARTERIOGRAM (3rd ORDER) AND PERCUTANEOUS COIL EMBOLIZATION COMPARISON:  None MEDICATIONS: None CONTRAST:  85 cc Omnipaque 300 ANESTHESIA/SEDATION: Fentanyl 50 mcg IV; Versed 1 mg IV Total Moderate Sedation Time 45 minutes FLUOROSCOPY TIME:  11 minutes 18 seconds (9,326 mGy) COMPLICATIONS: None immediate ACCESS: Right common femoral artery; hemostasis achieved with manual compression. TECHNIQUE: Informed written consent was obtained from the patient after a discussion of the risks, benefits and alternatives to treatment. Questions regarding the procedure were encouraged and answered. A timeout was performed prior to the initiation of the procedure. The right groin was prepped and draped in the usual sterile fashion, and a sterile drape was applied covering the operative field. Maximum barrier sterile technique with sterile gowns and gloves were used for the procedure. A timeout was performed prior to the initiation of the procedure. Local anesthesia was provided with 1% lidocaine. The right femoral head was marked fluoroscopically. Under ultrasound guidance, the right common femoral artery was accessed with a micropuncture kit after the overlying soft tissues were anesthetized with 1% lidocaine. An ultrasound image was saved for documentation purposes. The micropuncture sheath was exchanged for a 5 [place] vascular sheath over a Bentson wire. A closure arteriogram was performed through the side of the sheath confirming access within the right common femoral artery. Over a Bentson wire, a Mickelson catheter was advanced to the level of the thoracic aorta where it was back bled and flushed. The catheter was then utilized to select the celiac artery and a celiac arteriogram was performed.  Utilizing a Fathom 14 microwire, a regular renegade micro catheter was utilized to select the gastroduodenal artery and a gastroduodenal arteriogram was performed. The gastroduodenal artery was percutaneously coiled embolized to near its origin. Post embolization arteriogram demonstrates complete occlusion of the gastroduodenal artery. The Mickelson catheter was then utilized to select the superior mesenteric artery and a superior mesenteric arteriogram was performed. Images reviewed and the  procedure was terminated. All wires and catheters and sheaths were removed from the patient. Hemostasis was achieved at the right groin and access site with manual compression. A dressing was placed. The patient tolerated procedure well without immediate postprocedural complication. The patient was escorted to nuclear medicine department for planar imaging. 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 Otherwise, conventional branching pattern. No discrete areas of vessel irregularity or active extravasation are seen with special attention paid to the duodenal bulb. Sub selective gastroduodenal arteriogram demonstrates mild focal narrowing within the distal aspect of the GDA, again without discrete area of vessel irregularity or active extravasation. The gastroduodenal artery was successfully percutaneous coil embolized from its distal aspect to near its origin. Completion celiac arteriogram demonstrates complete occlusion of the GDA. Superior mesenteric arteriogram demonstrates a replaced hepatic artery arising from the proximal SMA. There is no definitive hypertrophied supply to the duodenum or duodenal bulb. IMPRESSION: Technically successful prophylactic embolization of the gastroduodenal artery for bleeding duodenal ulcer. Electronically Signed   By: Sandi Mariscal M.D.   On: 11/02/2015 03:04   Dg  Abd Acute W/chest  11/01/2015  CLINICAL DATA:  Perforated duodenal ulcer with hemorrhage (HCC) K26.6 (ICD-10-CM) EXAM: DG ABDOMEN ACUTE W/ 1V CHEST COMPARISON:  None. FINDINGS: There is no bowel dilation, but there scattered air-fluid levels on the decubitus view. No convincing free intraperitoneal air. No evidence of renal or ureteral stones. Soft tissues are unremarkable. Chest radiograph shows unremarkable heart, mediastinum and hila and clear lungs. A ventriculoperitoneal shunt crosses anterior chest extending into the right upper quadrant of the abdomen. IMPRESSION: 1. No convincing free air on the decubitus view. 2. No evidence of bowel obstruction. 3. Scattered air-fluid levels on the decubitus view suggests an adynamic ileus. 4. No active disease of the chest. Electronically Signed   By: Lajean Manes M.D.   On: 11/01/2015 18:11   [place] Guide Roadmapping  11/02/2015  INDICATION: Bleeding duodenal ulcer with hemodynamic instability. Please perform mesenteric arteriogram and potential percutaneous embolization. EXAM: 1. ULTRASOUND GUIDANCE FOR ARTERIAL ACCESS 2. CELIAC AND SUPERIOR MESENTERIC ARTERIOGRAM (1st ORDER) 3. GASTRODUODENAL ARTERIOGRAM (3rd ORDER) AND PERCUTANEOUS COIL EMBOLIZATION COMPARISON:  None MEDICATIONS: None CONTRAST:  85 cc Omnipaque 300 ANESTHESIA/SEDATION: Fentanyl 50 mcg IV; Versed 1 mg IV Total Moderate Sedation Time 45 minutes FLUOROSCOPY TIME:  11 minutes 18 seconds (8,563 mGy) COMPLICATIONS: None immediate ACCESS: Right common femoral artery; hemostasis achieved with manual compression. TECHNIQUE: Informed written consent was obtained from the patient after a discussion of the risks, benefits and alternatives to treatment. Questions regarding the procedure were encouraged and answered. A timeout was performed prior to the initiation of the procedure. The right groin was prepped and draped in the usual sterile fashion, and a sterile drape was  applied covering the operative field. Maximum barrier sterile technique with sterile gowns and gloves were used for the procedure. A timeout was performed prior to the initiation of the procedure. Local anesthesia was provided with 1% lidocaine. The right femoral head was marked fluoroscopically. Under ultrasound guidance, the right common femoral artery was accessed with a micropuncture kit after the overlying soft tissues were anesthetized with 1% lidocaine. An ultrasound image was saved for documentation purposes. The micropuncture sheath was exchanged for a 5 [place] vascular sheath over a Bentson wire. A closure arteriogram was performed through the side  of the sheath confirming access within the right common femoral artery. Over a Bentson wire, a Mickelson catheter was advanced to the level of the thoracic aorta where it was back bled and flushed. The catheter was then utilized to select the celiac artery and a celiac arteriogram was performed. Utilizing a Fathom 14 microwire, a regular renegade micro catheter was utilized to select the gastroduodenal artery and a gastroduodenal arteriogram was performed. The gastroduodenal artery was percutaneously coiled embolized to near its origin. Post embolization arteriogram demonstrates complete occlusion of the gastroduodenal artery. The Mickelson catheter was then utilized to select the superior mesenteric artery and a superior mesenteric arteriogram was performed. Images reviewed and the procedure was terminated. All wires and catheters and sheaths were removed from the patient. Hemostasis was achieved at the right groin and access site with manual compression. A dressing was placed. The patient tolerated procedure well without immediate postprocedural complication. The patient was escorted to nuclear medicine department for planar imaging. FINDINGS: Celiac arteriogram demonstrates absence of a right hepatic artery as well as hypertrophied supply of the distal aspect  of the GDA to the right gastroepiploic artery. Otherwise, conventional branching pattern. No discrete areas of vessel irregularity or active extravasation are seen with special attention paid to the duodenal bulb. Sub selective gastroduodenal arteriogram demonstrates mild focal narrowing within the distal aspect of the GDA, again without discrete area of vessel irregularity or active extravasation. The gastroduodenal artery was successfully percutaneous coil embolized from its distal aspect to near its origin. Completion celiac arteriogram demonstrates complete occlusion of the GDA. Superior mesenteric arteriogram demonstrates a replaced hepatic artery arising from the proximal SMA. There is no definitive hypertrophied supply to the duodenum or duodenal bulb. IMPRESSION: Technically successful prophylactic embolization of the gastroduodenal artery for bleeding duodenal ulcer. Electronically Signed   By: Sandi Mariscal M.D.   On: 11/02/2015 03:04   Assessment / Plan: *49 y/o male admitted with UGI bleed. Endoscopy 11/7 showed large duodenal ulcer with adherent clot. No endoscopic intervention was performed. Continued to drop Hgb (from 8.2 grams to 6.4 grams within hours) so EGD was repeat 11/9 with cautery and epi injection. Patient re-bled profusely a few hours post procedure and underwent angiography with GDA embolization on evening of 11/9 by IR. He is s/p 8 units PRBC's since admission for acute blood loss anemia.  Hgb still trending down but no sign of GI bleeding currently; ? Equilibration.    -Continue PPI gtt. Monitor Hgb; transfuse prn. Continue only NPO with ice chips only for now until Hgb stabilizes.  If re-bleeds then will likely need surgery.  Likely no further intervention from GI standpoint.   LOS: 5 days   Zachary Thornton D.  11/03/2015, 9:15 AM  Pager number [phone]

## 2015-11-04 LAB — GLUCOSE, CAPILLARY
GLUCOSE-CAPILLARY: 113 mg/dL — AB (ref 65–99)
GLUCOSE-CAPILLARY: 79 mg/dL (ref 65–99)
GLUCOSE-CAPILLARY: 86 mg/dL (ref 65–99)
GLUCOSE-CAPILLARY: 87 mg/dL (ref 65–99)
GLUCOSE-CAPILLARY: 91 mg/dL (ref 65–99)
Glucose-Capillary: 83 mg/dL (ref 65–99)

## 2015-11-04 LAB — CBC
HEMATOCRIT: 23.3 % — AB (ref 39.0–52.0)
HEMOGLOBIN: 7.9 g/dL — AB (ref 13.0–17.0)
MCH: 29.9 pg (ref 26.0–34.0)
MCHC: 33.9 g/dL (ref 30.0–36.0)
MCV: 88.3 fL (ref 78.0–100.0)
PLATELETS: 171 10*3/uL (ref 150–400)
RBC: 2.64 MIL/uL — ABNORMAL LOW (ref 4.22–5.81)
RDW: 16.4 % — ABNORMAL HIGH (ref 11.5–15.5)
WBC: 5 10*3/uL (ref 4.0–10.5)

## 2015-11-04 LAB — HEMATOCRIT: HEMATOCRIT: 25 % — AB (ref 39.0–52.0)

## 2015-11-04 LAB — HEMOGLOBIN: Hemoglobin: 8.5 g/dL — ABNORMAL LOW (ref 13.0–17.0)

## 2015-11-04 LAB — H. PYLORI ANTIBODY, IGG

## 2015-11-04 MED ORDER — PANTOPRAZOLE SODIUM 40 MG IV SOLR
40.0000 mg | Freq: Two times a day (BID) | INTRAVENOUS | Status: DC
  Administered 2015-11-04 – 2015-11-06 (×4): 40 mg via INTRAVENOUS
  Filled 2015-11-04 (×6): qty 40

## 2015-11-04 NOTE — Progress Notes (Signed)
Walked around half of the unit with monitor.  HR increased to 128 while walking.  No resp distress or decrease in o2 sats.  Continue to increase activity as patient tolerates.  Toleratiing clear liquid diet currently.  Tavis Kring Debroah LoopArnold RN

## 2015-11-04 NOTE — Progress Notes (Signed)
Progress Note   Jacquelyn Antony YQI:[id number] DOB: July 27, 1966 DOA: 10/29/2015 PCP: [name], MD   Brief Narrative:    49 y.o. male the PMH of TBI admit 10/29/15 [place] episode at church with brief loss of consciousness.  EMS was called and the patient was brought to the ED for further evaluation where he was found to be hypotensive with a hemoglobin of 6.4. Rectal exam revealed Hemoccult positive stools.  While in the ED, the patient had hematemesis and a large black stool. GI subsequently was consulted. EGD performed 11/7 = post bulbar duodenal ulcer Noted to have further drop in hemoglobin underwent repeat endoscopy 11/9 showing clean-based G ulceration large duodenal ulcer with visible bleeding and bruising which was treated with cautery and epinephrine Patient had large amounts of dark stool subsequent to that he was transferred to ICU Critical care, interventional radiology, general surgery were consulted Patient underwent coil embolization of gastroduodenal artery for bleeding ulcer under interventional 11/9 His hemoglobin has stabilized over the course of 11/10-11/12 but bneeded trasnfusion  Assessment/Plan:     Syncope and collapse secondary to duodenal ulcer hemorrhage with hypotension/Acute blood loss anemia  - Status post 9 units of blood, aggressive IV fluids with stabilization of blood pressure. - Monitoring H&H Q 24 hours.  - EGD done by GI 10/30/15. Large ulcer found distal to the duodenal bulb with an adherent clot.  -further ? in Hb noted 11/01/15 am -Rpt EGD showed Clean basd GEJ ulceration + Large duodenal ulcer + oozing Rx Goldprobe + EPI and hemostasis -Further large bleed 11/9 necessitated coil embolization of GDA by IR -Patient has been transfused and hemoglobin stabilizing in the 7 range and needed i more PRBC unit 11/11 pm -No overt bleed however would monitor on step down  Until we see tolerance of CLD without any specific bleeding  -transition from  PPI infusion protonix 8cc/hr to protonix BID tonight as 72 hrs are up -grad diet as per GI    Urinary retention - Continues to have difficulty voiding.  -Continue indwelling catherter -clamp and void trial - Mobilize.   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  - Continue Tegretol and Lexapro When able to take by mouth  Impaired glucose tolerance -hold off coverage for right now  Hypokalemia -resolved with replcement  DVT prophylaxis - SCDs.   Family Communication/Anticipated D/C date and plan/Code Status   Family Communication: no famiyl + at the beside Disposition Plan: Home when hemodynamically stable  Anticipated D/C date:   ? Still unclear-not on diet yet and needs further hospital care Code Status: Full    Code Status Orders        Start     Ordered   10/29/15 1856  Full code   Continuous     10/29/15 1855       IV Access:    Peripheral IV   Procedures and diagnostic studies:   EGD 10/30/15 EGD 11/01/15 IR GDA embolization 11/01/15  Medical Consultants:    Gastroenterology: [name], MD  Anti-Infectives:   Anti-infectives    None      Subjective:   Doing ok No further bleeding Note transfused last pm No cp n/v Otherwise no new issues  Objective:    Filed Vitals:   11/04/15 0800 11/04/15 0900 11/04/15 1000 11/04/15 1200  BP: 134/89 136/71 138/81 131/69  Pulse: 111 90 90  95  Temp: 98.3 F (36.8 C)   98.3 F (36.8 C)  TempSrc: Oral   Oral  Resp: 14 14 14 11   Height:      Weight:      SpO2: 97% 96% 97% 98%    Intake/Output Summary (Last 24 hours) at 11/04/15 1301 Last data filed at 11/04/15 1200  Gross per 24 hour  Intake 2975.5 ml  Output   4050 ml  Net -1074.5 ml   Filed Weights   10/31/15 0354 11/03/15 0500 11/04/15 0400  Weight: 84.3 kg (185 lb 13.6 oz)  90.8 kg (200 lb 2.8 oz) 88.9 kg (195 lb 15.8 oz)    Exam: Gen:  NAD, color improved Cardiovascular:  RRR, No M/R/G Respiratory:  Lungs CTAB Gastrointestinal:  Abdomen soft, NT/ND, + BS.  No rebound no guard, Extremities:  No C/E/C   Data Reviewed:    Labs: Basic Metabolic Panel:  Recent Labs Lab 10/29/15 1316 11/01/15 1658 11/01/15 2050 11/02/15 0210 11/03/15 0430  NA 138 141 141 139 140  K 4.3 3.3* 3.9 4.0 3.4*  CL 107 117* 118* 114* 115*  CO2 26 20* 20* 18* 22  GLUCOSE 130* 144* 165* 152* 105*  BUN 20 19 18 16 11   CREATININE 0.89 0.93 0.88 0.83 0.59*  CALCIUM 8.7* 6.6* 6.6* 7.2* 7.2*  MG  --   --   --  1.6*  --    GFR Estimated Creatinine Clearance: 123.2 mL/min (by C-G formula based on Cr of 0.59). Liver Function Tests:  Recent Labs Lab 10/29/15 1930 11/01/15 1658  AST 12* 15  ALT 10* 9*  ALKPHOS 44 34*  BILITOT 0.8 0.4  PROT 4.4* 3.1*  ALBUMIN 2.1* 1.6*   Coagulation profile  Recent Labs Lab 10/29/15 1930 11/01/15 2050 11/02/15 0210  INR 1.25 1.32 1.14    CBC:  Recent Labs Lab 11/01/15 1658  11/02/15 0210 11/02/15 0833 11/02/15 1420 11/03/15 0052 11/03/15 0430 11/03/15 1038 11/03/15 2235 11/04/15 0428 11/04/15 1047  WBC 11.2*  < > 13.2* 8.7 10.0 7.3  --   --   --  5.0  --   NEUTROABS 9.1*  --   --   --   --   --   --   --   --   --   --   HGB 6.6*  < > 11.5* 9.0* 8.7* 7.6* 7.2* 7.3* 6.9* 7.9* 8.5*  HCT 19.8*  < > 33.6* 26.1* 25.3* 22.0* 21.6* 21.6* 20.4* 23.3* 25.0*  MCV 90.4  < > 88.4 87.3 87.2 87.0  --   --   --  88.3  --   PLT 165  < > 141* 146* 147* 151  --   --   --  171  --   < > = values in this interval not displayed. CBG:  Recent Labs Lab 11/03/15 2022 11/03/15 2358 11/04/15 0416 11/04/15 0742 11/04/15 1220  GLUCAP 89 87 86 83 79    Microbiology Recent Results (from the past 240 hour(s))  MRSA PCR Screening     Status: None   Collection Time: 10/29/15  6:30 PM  Result Value Ref Range Status   MRSA by PCR  NEGATIVE NEGATIVE Final    Comment:        The GeneXpert MRSA Assay (FDA approved for NASAL specimens only), is one component of a comprehensive MRSA colonization surveillance program. It is not intended to diagnose MRSA infection nor to guide or monitor treatment for MRSA infections.  Medications:   . sodium chloride   Intravenous Once  . antiseptic oral rinse  7 mL Mouth Rinse BID  . carbamazepine  600 mg Oral BID  . cholecalciferol  1,000 Units Oral Daily  . diphenhydrAMINE  25 mg Intravenous Once  . escitalopram  20 mg Oral Daily  . insulin aspart  0-9 Units Subcutaneous 6 times per day  . multivitamin with minerals  1 tablet Oral Daily  . pantoprazole (PROTONIX) IV  40 mg Intravenous Q12H   Continuous Infusions: . sodium chloride 75 mL/hr (11/04/15 0212)  . pantoprozole (PROTONIX) infusion 8 mg/hr (11/04/15 1200)    Time spent: 25 minutes.  Pleas Koch, MD [facility] [phone]   11/04/2015, 1:01 PM

## 2015-11-05 LAB — BASIC METABOLIC PANEL
Anion gap: 5 (ref 5–15)
BUN: 7 mg/dL (ref 6–20)
CALCIUM: 7.4 mg/dL — AB (ref 8.9–10.3)
CO2: 26 mmol/L (ref 22–32)
Chloride: 110 mmol/L (ref 101–111)
Creatinine, Ser: 0.63 mg/dL (ref 0.61–1.24)
GFR calc Af Amer: >60 mL/min (ref >60)
GLUCOSE: 99 mg/dL (ref 65–99)
Potassium: 3 mmol/L — ABNORMAL LOW (ref 3.5–5.1)
Sodium: 141 mmol/L (ref 135–145)

## 2015-11-05 LAB — TYPE AND SCREEN
ABO/RH(D): O NEG
ANTIBODY SCREEN: NEGATIVE
UNIT DIVISION: 0

## 2015-11-05 LAB — CBC WITH DIFFERENTIAL/PLATELET
Basophils Absolute: 0 10*3/uL (ref 0.0–0.1)
Basophils Relative: 0 %
Eosinophils Absolute: 0.2 10*3/uL (ref 0.0–0.7)
Eosinophils Relative: 5 %
HEMATOCRIT: 24.9 % — AB (ref 39.0–52.0)
Hemoglobin: 8.3 g/dL — ABNORMAL LOW (ref 13.0–17.0)
LYMPHS PCT: 24 %
Lymphs Abs: 1.1 10*3/uL (ref 0.7–4.0)
MCH: 30.1 pg (ref 26.0–34.0)
MCHC: 33.3 g/dL (ref 30.0–36.0)
MCV: 90.2 fL (ref 78.0–100.0)
MONO ABS: 0.4 10*3/uL (ref 0.1–1.0)
MONOS PCT: 10 %
NEUTROS ABS: 2.8 10*3/uL (ref 1.7–7.7)
Neutrophils Relative %: 61 %
Platelets: 236 10*3/uL (ref 150–400)
RBC: 2.76 MIL/uL — ABNORMAL LOW (ref 4.22–5.81)
RDW: 16 % — AB (ref 11.5–15.5)
WBC: 4.6 10*3/uL (ref 4.0–10.5)

## 2015-11-05 LAB — GLUCOSE, CAPILLARY
Glucose-Capillary: 90 mg/dL (ref 65–99)
Glucose-Capillary: 97 mg/dL (ref 65–99)
Glucose-Capillary: 89 mg/dL (ref 65–99)
Glucose-Capillary: 89 mg/dL (ref 65–99)
Glucose-Capillary: 116 mg/dL — ABNORMAL HIGH (ref 65–99)
Glucose-Capillary: 118 mg/dL — ABNORMAL HIGH (ref 65–99)

## 2015-11-05 MED ORDER — HYDRALAZINE HCL 20 MG/ML IJ SOLN
5.0000 mg | INTRAMUSCULAR | Status: DC
  Administered 2015-11-05 – 2015-11-06 (×5): 5 mg via INTRAVENOUS
  Filled 2015-11-05: qty 1
  Filled 2015-11-05 (×5): qty 0.25
  Filled 2015-11-05: qty 1
  Filled 2015-11-05: qty 0.25
  Filled 2015-11-05: qty 1

## 2015-11-05 MED ORDER — INSULIN ASPART 100 UNIT/ML ~~LOC~~ SOLN: 0.0000 [IU] | Freq: Three times a day (TID) | SUBCUTANEOUS | Status: DC

## 2015-11-05 MED ORDER — LIP MEDEX EX OINT
TOPICAL_OINTMENT | CUTANEOUS | Status: AC
  Administered 2015-11-05: 18:00:00
  Filled 2015-11-05: qty 7

## 2015-11-05 MED ORDER — POTASSIUM CHLORIDE IN NACL 40-0.9 MEQ/L-% IV SOLN
INTRAVENOUS | Status: DC
  Administered 2015-11-05 (×2): 100 mL/h via INTRAVENOUS
  Filled 2015-11-05 (×4): qty 1000

## 2015-11-05 NOTE — Progress Notes (Signed)
Patient's foley clamped after drained bladder.  Instructed patient on letting the nurse know when he needs to void.  Monitor output closely.  Toneisha Savary Debroah LoopArnold RN

## 2015-11-05 NOTE — Progress Notes (Signed)
Progress Note   Askari Kinley ZOX:[id number] DOB: 1966/11/18 DOA: 10/29/2015 PCP: [name], MD   Brief Narrative:    49 y.o. male the PMH of TBI admit 10/29/15 [place] episode at church with brief loss of consciousness.  EMS was called and the patient was brought to the ED for further evaluation where he was found to be hypotensive with a hemoglobin of 6.4. Rectal exam revealed Hemoccult positive stools.  While in the ED, the patient had hematemesis and a large black stool. GI subsequently was consulted. EGD performed 11/7 = post bulbar duodenal ulcer Noted to have further drop in hemoglobin underwent repeat endoscopy 11/9 showing clean-based G ulceration large duodenal ulcer with visible bleeding and bruising which was treated with cautery and epinephrine Patient had large amounts of dark stool subsequent to that he was transferred to ICU Critical care, interventional radiology, general surgery were consulted Patient underwent coil embolization of gastroduodenal artery for bleeding ulcer under interventional 11/9 His hemoglobin has stabilized over the course of 11/10-11/12 but bneeded trasnfusion  Assessment/Plan:     Syncope and collapse secondary to duodenal ulcer hemorrhage with hypotension/Acute blood loss anemia  - Status post 9 units of blood, aggressive IV fluids with stabilization of blood pressure. - Monitoring H&H Q 24 hours.  - EGD done by GI 10/30/15. Large ulcer found distal to the duodenal bulb with an adherent clot.  -further ? in Hb noted 11/01/15 am -Rpt EGD showed Clean basd GEJ ulceration + Large duodenal ulcer + oozing Rx Goldprobe + EPI and hemostasis -Further large bleed 11/9 necessitated coil embolization of GDA by IR -Patient has been transfused and hemoglobin stabilizing in the 7 range and needed i more PRBC unit 11/11 pm-hemoglobin 8.3 -No overt bleeding  -transition from PPI infusion protonix 8cc/hr to protonix BID tonight as 72 hrs are up -grad  diet as per GI - tolerance of CLD without any specific bleeding so far-would graduate slowly to full liquid diet in am     Urinary retention - Continues to have difficulty voiding.  -Continue indwelling catherter -clamp and void trial - Mobilize.   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  - Added Hydralazine 5 mg iv q4 BP >140 systolic - Continue Tegretol and Lexapro When able to take by mouth--will consider resumption of meds in am  Impaired glucose tolerance -hold off coverage for right now  Hypokalemia -replacing with IV saline c 40 of K at 100 cc/hr -rpt labs in the am  DVT prophylaxis - SCDs.   Family Communication/Anticipated D/C date and plan/Code Status   Family Communication: d/w mother 11/05/15 am Disposition Plan: Home when hemodynamically stable  Anticipated D/C date:   ? Still unclear-not on diet yet and needs further hospital care Code Status: Full    Code Status Orders        Start     Ordered   10/29/15 1856  Full code   Continuous     10/29/15 1855       IV Access:    Peripheral IV   Procedures and diagnostic studies:   EGD 10/30/15 EGD 11/01/15 IR GDA embolization 11/01/15  Medical Consultants:    Gastroenterology: [name], MD  Anti-Infectives:   Anti-infectives    None      Subjective:   Doing ok Nursing reports no bleeding but a dark stool which looked like old  blood overnight Patient is tolerating clear liquid diet fairly well No nausea no vomiting no abdominal pain  Objective:    Filed Vitals:   11/05/15 0800 11/05/15 0900 11/05/15 1000 11/05/15 1100  BP: 150/92 144/82 131/79 147/89  Pulse: 118 110 95 89  Temp: 97.5 F (36.4 C)     TempSrc: Oral     Resp: 23 16 14 17   Height:      Weight:      SpO2: 98% 99% 96% 96%    Intake/Output Summary (Last  24 hours) at 11/05/15 1142 Last data filed at 11/05/15 1100  Gross per 24 hour  Intake   4230 ml  Output   2900 ml  Net   1330 ml   Filed Weights   10/31/15 0354 11/03/15 0500 11/04/15 0400  Weight: 84.3 kg (185 lb 13.6 oz) 90.8 kg (200 lb 2.8 oz) 88.9 kg (195 lb 15.8 oz)    Exam: Gen:  NAD, color improved Cardiovascular:  RRR, No M/R/G Respiratory:  Lungs CTAB Gastrointestinal:  Abdomen soft, NT/ND, + BS.  No rebound no guard, Extremities:  No C/E/C   Data Reviewed:    Labs: Basic Metabolic Panel:  Recent Labs Lab 11/01/15 1658 11/01/15 2050 11/02/15 0210 11/03/15 0430 11/05/15 0352  NA 141 141 139 140 141  K 3.3* 3.9 4.0 3.4* 3.0*  CL 117* 118* 114* 115* 110  CO2 20* 20* 18* 22 26  GLUCOSE 144* 165* 152* 105* 99  BUN 19 18 16 11 7   CREATININE 0.93 0.88 0.83 0.59* 0.63  CALCIUM 6.6* 6.6* 7.2* 7.2* 7.4*  MG  --   --  1.6*  --   --    GFR Estimated Creatinine Clearance: 123.2 mL/min (by C-G formula based on Cr of 0.63). Liver Function Tests:  Recent Labs Lab 10/29/15 1930 11/01/15 1658  AST 12* 15  ALT 10* 9*  ALKPHOS 44 34*  BILITOT 0.8 0.4  PROT 4.4* 3.1*  ALBUMIN 2.1* 1.6*   Coagulation profile  Recent Labs Lab 10/29/15 1930 11/01/15 2050 11/02/15 0210  INR 1.25 1.32 1.14    CBC:  Recent Labs Lab 11/01/15 1658  11/02/15 0833 11/02/15 1420 11/03/15 0052  11/03/15 1038 11/03/15 2235 11/04/15 0428 11/04/15 1047 11/05/15 0352  WBC 11.2*  < > 8.7 10.0 7.3  --   --   --  5.0  --  4.6  NEUTROABS 9.1*  --   --   --   --   --   --   --   --   --  2.8  HGB 6.6*  < > 9.0* 8.7* 7.6*  < > 7.3* 6.9* 7.9* 8.5* 8.3*  HCT 19.8*  < > 26.1* 25.3* 22.0*  < > 21.6* 20.4* 23.3* 25.0* 24.9*  MCV 90.4  < > 87.3 87.2 87.0  --   --   --  88.3  --  90.2  PLT 165  < > 146* 147* 151  --   --   --  171  --  236  < > = values in this interval not displayed. CBG:  Recent Labs Lab 11/04/15 1559 11/04/15 1910 11/05/15 0054 11/05/15 0356 11/05/15 0802    GLUCAP 91 113* 90 97 89    Microbiology Recent Results (from the past 240 hour(s))  MRSA PCR Screening     Status: None   Collection Time: 10/29/15  6:30 PM  Result Value Ref Range Status   MRSA by PCR NEGATIVE NEGATIVE Final  Comment:        The GeneXpert MRSA Assay (FDA approved for NASAL specimens only), is one component of a comprehensive MRSA colonization surveillance program. It is not intended to diagnose MRSA infection nor to guide or monitor treatment for MRSA infections.      Medications:   . sodium chloride   Intravenous Once  . antiseptic oral rinse  7 mL Mouth Rinse BID  . carbamazepine  600 mg Oral BID  . cholecalciferol  1,000 Units Oral Daily  . diphenhydrAMINE  25 mg Intravenous Once  . escitalopram  20 mg Oral Daily  . insulin aspart  0-9 Units Subcutaneous TID AC & HS  . multivitamin with minerals  1 tablet Oral Daily  . pantoprazole (PROTONIX) IV  40 mg Intravenous Q12H   Continuous Infusions: . 0.9 % NaCl with KCl 40 mEq / L 100 mL/hr (11/05/15 0806)    Time spent: 25 minutes.  Pleas Koch, MD [facility] [phone]   11/05/2015, 11:42 AM

## 2015-11-05 NOTE — Progress Notes (Signed)
Text page per order Dr. Mahala MenghiniSamtani letting him know pt did not feel the sensation to urinate. Awaiting orders. Will continue to monitor and will report to night shift nurse.

## 2015-11-06 LAB — GLUCOSE, CAPILLARY
GLUCOSE-CAPILLARY: 117 mg/dL — AB (ref 65–99)
GLUCOSE-CAPILLARY: 115 mg/dL — AB (ref 65–99)
GLUCOSE-CAPILLARY: 109 mg/dL — AB (ref 65–99)
Glucose-Capillary: 125 mg/dL — ABNORMAL HIGH (ref 65–99)

## 2015-11-06 LAB — CBC WITH DIFFERENTIAL/PLATELET
BASOS ABS: 0 10*3/uL (ref 0.0–0.1)
BASOS PCT: 0 %
EOS ABS: 0.2 10*3/uL (ref 0.0–0.7)
EOS PCT: 4 %
HCT: 24.7 % — ABNORMAL LOW (ref 39.0–52.0)
Hemoglobin: 8.1 g/dL — ABNORMAL LOW (ref 13.0–17.0)
Lymphocytes Relative: 23 %
Lymphs Abs: 1 10*3/uL (ref 0.7–4.0)
MCH: 29.3 pg (ref 26.0–34.0)
MCHC: 32.8 g/dL (ref 30.0–36.0)
MCV: 89.5 fL (ref 78.0–100.0)
MONO ABS: 0.4 10*3/uL (ref 0.1–1.0)
Monocytes Relative: 9 %
Neutro Abs: 2.8 10*3/uL (ref 1.7–7.7)
Neutrophils Relative %: 64 %
PLATELETS: 310 10*3/uL (ref 150–400)
RBC: 2.76 MIL/uL — AB (ref 4.22–5.81)
RDW: 15.6 % — AB (ref 11.5–15.5)
WBC: 4.4 10*3/uL (ref 4.0–10.5)

## 2015-11-06 MED ORDER — PANTOPRAZOLE SODIUM 40 MG PO TBEC
40.0000 mg | DELAYED_RELEASE_TABLET | Freq: Two times a day (BID) | ORAL | Status: DC
  Administered 2015-11-06 – 2015-11-07 (×2): 40 mg via ORAL
  Filled 2015-11-06 (×3): qty 1

## 2015-11-06 MED ORDER — PROPRANOLOL HCL 60 MG PO TABS
120.0000 mg | ORAL_TABLET | Freq: Two times a day (BID) | ORAL | Status: DC
  Administered 2015-11-06 – 2015-11-07 (×2): 120 mg via ORAL
  Filled 2015-11-06 (×3): qty 2

## 2015-11-06 NOTE — Care Management Important Message (Signed)
Important Message  Patient Details  Name: Zachary Thornton MRN: [id number] Date of Birth: 05/02/1966   Medicare Important Message Given:  Yes    Haskell FlirtJamison, Kiante Petrovich 11/06/2015, 12:18 PMImportant Message  Patient Details  Name: Zachary Thornton MRN: [id number] Date of Birth: 02/02/1966   Medicare Important Message Given:  Yes    Haskell FlirtJamison, Annya Lizana 11/06/2015, 12:17 PM

## 2015-11-06 NOTE — Progress Notes (Signed)
NT reported to me that Pt had a large stool, and it was black in color.  Will continue to monitor.

## 2015-11-06 NOTE — Progress Notes (Signed)
Paged provider on call about pt's foley catheter. It was clamped for a voiding trial.  Pt was producing urine in the tube.  Not sure how much had been emptied before I came on because there wasn't anything documented. Pt denied having the urge to urinate.  Provider instructed me to unclamp catheter.

## 2015-11-06 NOTE — Progress Notes (Signed)
Progress Note   Zachary Thornton JYN:[id number] DOB: Dec 07, 1966 DOA: 10/29/2015 PCP: [name], MD   Brief Narrative:    49 y.o. male the PMH of TBI admit 10/29/15 [place] episode [place] with brief [place] of consciousness.  EMS was called and the patient was brought to the ED for further evaluation where he was found to be hypotensive with a hemoglobin of 6.4. Rectal exam revealed Hemoccult positive stools.  While in the ED, the patient had hematemesis and a large black stool. GI subsequently was consulted. EGD performed 11/7 = post bulbar duodenal ulcer Noted to have further drop in hemoglobin underwent repeat endoscopy 11/9 showing clean-based G ulceration large duodenal ulcer with visible bleeding and bruising which was treated with cautery and epinephrine Patient had large amounts of dark stool subsequent to that he was transferred to ICU Critical care, interventional radiology, general surgery were consulted Patient underwent coil embolization of gastroduodenal artery for bleeding ulcer under interventional 11/9 His hemoglobin has stabilized over the course of 11/10-11/12 but bneeded trasnfusion  Assessment/Plan:     Syncope and collapse secondary to duodenal ulcer hemorrhage with hypotension/Acute blood loss anemia  - Status post 9 units of blood, aggressive IV fluids with stabilization of blood pressure. - Monitoring H&H Q 24 hours.  - EGD done by GI 10/30/15. Large ulcer found distal to the duodenal bulb with an adherent clot.  -further ? in Hb noted 11/01/15 am -Rpt EGD showed Clean basd GEJ ulceration + Large duodenal ulcer + oozing Rx Goldprobe + EPI and hemostasis -Further large bleed 11/9 necessitated coil embolization of GDA by IR -Patient has been transfused and hemoglobin stabilizing in the 7 range and needed i more PRBC unit 11/11 pm-hemoglobin 8.3 -No overt bleeding  -transition from PPI infusion protonix 8cc/hr to protonix BID tonight as 72 hrs are up -grad  diet as per GI  - tolerating full diet -monitor cbc am and transfuse if below 8.0    Urinary retention - Continues to have difficulty voiding.  -Continue indwelling catherter -clamp and void trial - Mobilize.   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  - Added Hydralazine 5 mg iv q4 BP >140 systolic - Continue Tegretol and Lexapro When able to take by mouth--will consider resumption of meds in am  Impaired glucose tolerance -hold off coverage for right now  Hypokalemia -replacing with IV saline c 40 of K at 100 cc/hr-saline lock -rpt labs in the am  DVT prophylaxis - SCDs.   Family Communication/Anticipated D/C date and plan/Code Status   Family Communication: d/w mother 11/05/15 am Disposition Plan: Home when hemodynamically stable  Anticipated D/C date:   potential d/c 24-48 hrs Code Status: Full    Code Status Orders        Start     Ordered   10/29/15 1856  Full code   Continuous     10/29/15 1855       IV Access:    Peripheral IV   Procedures and diagnostic studies:   EGD 10/30/15 EGD 11/01/15 IR GDA embolization 11/01/15  Medical Consultants:    Gastroenterology: [name], MD  Anti-Infectives:   Anti-infectives    None      Subjective:   Doing ok Nursing reports no bleeding but a dark stool which looked like old blood overnight Patient is tolerating clear liquid diet fairly well No nausea  no vomiting no abdominal pain  Objective:    Filed Vitals:   11/06/15 0826 11/06/15 1216 11/06/15 1421 11/06/15 1556  BP: 150/90 145/84 132/67 146/87  Pulse: 117 112 109 103  Temp:  97.9 F (36.6 C) 98.3 F (36.8 C)   TempSrc:  Oral Oral   Resp:  16 15   Height:      Weight:      SpO2:  99% 96%     Intake/Output Summary (Last 24 hours) at 11/06/15 1607 Last data filed at  11/06/15 1422  Gross per 24 hour  Intake   2480 ml  Output   4401 ml  Net  -1921 ml   Filed Weights   10/31/15 0354 11/03/15 0500 11/04/15 0400  Weight: 84.3 kg (185 lb 13.6 oz) 90.8 kg (200 lb 2.8 oz) 88.9 kg (195 lb 15.8 oz)    Exam: Gen:  NAD, color improved Cardiovascular:  RRR, No M/R/G Respiratory:  Lungs CTAB Gastrointestinal:  Abdomen soft, NT/ND, + BS.  No rebound no guard, Extremities:  No C/E/C   Data Reviewed:    Labs: Basic Metabolic Panel:  Recent Labs Lab 11/01/15 1658 11/01/15 2050 11/02/15 0210 11/03/15 0430 11/05/15 0352  NA 141 141 139 140 141  K 3.3* 3.9 4.0 3.4* 3.0*  CL 117* 118* 114* 115* 110  CO2 20* 20* 18* 22 26  GLUCOSE 144* 165* 152* 105* 99  BUN CREATININE 0.93 0.88 0.83 0.59* 0.63  CALCIUM 6.6* 6.6* 7.2* 7.2* 7.4*  MG  --   --  1.6*  --   --    GFR Estimated Creatinine Clearance: 123.2 mL/min (by C-G formula based on Cr of 0.63). Liver Function Tests:  Recent Labs Lab 11/01/15 1658  AST 15  ALT 9*  ALKPHOS 34*  BILITOT 0.4  PROT 3.1*  ALBUMIN 1.6*   Coagulation profile  Recent Labs Lab 11/01/15 2050 11/02/15 0210  INR 1.32 1.14    CBC:  Recent Labs Lab 11/01/15 1658  11/02/15 1420 11/03/15 0052  11/03/15 2235 11/04/15 0428 11/04/15 1047 11/05/15 0352 11/06/15 0540  WBC 11.2*  < > 10.0 7.3  --   --  5.0  --  4.6 4.4  NEUTROABS 9.1*  --   --   --   --   --   --   --  2.8 2.8  HGB 6.6*  < > 8.7* 7.6*  < > 6.9* 7.9* 8.5* 8.3* 8.1*  HCT 19.8*  < > 25.3* 22.0*  < > 20.4* 23.3* 25.0* 24.9* 24.7*  MCV 90.4  < > 87.2 87.0  --   --  88.3  --  90.2 89.5  PLT 165  < > 147* 151  --   --  171  --  236 310  < > = values in this interval not displayed. CBG:  Recent Labs Lab 11/05/15 1553 11/05/15 2128 11/06/15 0805 11/06/15 1140 11/06/15 1559  GLUCAP 116* 118* 117* 115* 109*    Microbiology Recent Results (from the past 240 hour(s))  MRSA PCR Screening     Status: None   Collection Time:  10/29/15  6:30 PM  Result Value Ref Range Status   MRSA by PCR NEGATIVE NEGATIVE Final    Comment:        The GeneXpert MRSA Assay (FDA approved for NASAL specimens only), is one component of a comprehensive MRSA colonization surveillance program. It is not intended to diagnose MRSA infection nor to guide or  monitor treatment for MRSA infections.      Medications:   . sodium chloride   Intravenous Once  . antiseptic oral rinse  7 mL Mouth Rinse BID  . carbamazepine  600 mg Oral BID  . cholecalciferol  1,000 Units Oral Daily  . diphenhydrAMINE  25 mg Intravenous Once  . escitalopram  20 mg Oral Daily  . hydrALAZINE  5 mg Intravenous Q4H  . insulin aspart  0-9 Units Subcutaneous TID AC & HS  . multivitamin with minerals  1 tablet Oral Daily  . pantoprazole  40 mg Oral BID   Continuous Infusions: . 0.9 % NaCl with KCl 40 mEq / L 100 mL/hr (11/05/15 1729)    Time spent: 25 minutes.  Pleas KochJai Sky Borboa, MD [facility] [phone]   11/06/2015, 4:07 PM

## 2015-11-06 NOTE — Progress Notes (Signed)
Patient ID: Brandt LoosenScott Narayanan, male   DOB: 11/29/1966, 49 y.o.   MRN: [id number]    Progress Note   Subjective  In good spirits.. No complaints, hoping to go home soon  One black stool last pm hgb 8.1 stable   Objective   Vital signs in last 24 hours: Temp:  [98.3 F (36.8 C)-99 F (37.2 C)] 98.3 F (36.8 C) (11/14 0656) Pulse Rate:  [84-117] 117 (11/14 0826) Resp:  [14-18] 16 (11/14 0656) BP: (116-150)/(69-90) 150/90 mmHg (11/14 0826) SpO2:  [96 %-99 %] 97 % (11/14 0656) Last BM Date: 11/06/15 General:    white male in NAD Heart:  Regular rate and rhythm; no murmurs Lungs: Respirations even and unlabored, lungs CTA bilaterally Abdomen:  Soft, nontender and nondistended. Normal bowel sounds. Extremities:  Without edema. Neurologic:  Alert and oriented,  grossly normal neurologically. Psych:  Cooperative. Normal mood and affect.  Intake/Output from previous day: 11/13 0701 - 11/14 0700 In: 3260 [P.O.:1160; I.V.:2100] Out: 3401 [Urine:3400; Stool:1] Intake/Output this shift:    Lab Results:  Recent Labs  11/04/15 0428 11/04/15 1047 11/05/15 0352 11/06/15 0540  WBC 5.0  --  4.6 4.4  HGB 7.9* 8.5* 8.3* 8.1*  HCT 23.3* 25.0* 24.9* 24.7*  PLT 171  --  236 310   BMET  Recent Labs  11/05/15 0352  NA 141  K 3.0*  CL 110  CO2 26  GLUCOSE 99  BUN 7  CREATININE 0.63  CALCIUM 7.4*   LFT No results for input(s): PROT, ALBUMIN, AST, ALT, ALKPHOS, BILITOT, BILIDIR, IBILI in the last 72 hours. PT/INR No results for input(s): LABPROT, INR in the last 72 hours.       Assessment / Plan:    #1 49 yo male with large deep duodenal ulcer with major  GI bleed, refractory to endoscopic therapy and s/p IR coiling on 11/9 with hemostasis.  Has been stable since, some slow drift in hgb, but no overt active bleeding #2 anemia -secondary to acute blood loss #3 TBI  Plan; OK to switch to po PPI BID, and will need BID on discharge  Will plan office follow up in 10 -14 days  after discharge , and follow blood counts serially [facility] will call family this week with Appt with Dr Christella HartiganJacobs, or Lyla GlassingJess Zehr PAC. If hgb drifts below 8 would transfuse prior to discharge .  Principal Problem:   Syncope and collapse Active Problems:   Traumatic brain injury with depressed frontal skull fracture (HCC)   Cognitive deficit as late effect of traumatic brain injury [facility](HCC)   Episodic dyscontrol syndrome   Acute blood loss anemia   Hypotension   Urinary retention   Duodenal ulcer hemorrhage   Hypokalemia   Lactic acidosis   Bleeding duodenal ulcer     LOS: 8 days   Chaia Ikard  11/06/2015, 9:01 AM

## 2015-11-07 LAB — RENAL FUNCTION PANEL
ALBUMIN: 2 g/dL — AB (ref 3.5–5.0)
ANION GAP: 4 — AB (ref 5–15)
BUN: 6 mg/dL (ref 6–20)
CHLORIDE: 110 mmol/L (ref 101–111)
CO2: 26 mmol/L (ref 22–32)
Calcium: 8.1 mg/dL — ABNORMAL LOW (ref 8.9–10.3)
Creatinine, Ser: 0.69 mg/dL (ref 0.61–1.24)
Glucose, Bld: 110 mg/dL — ABNORMAL HIGH (ref 65–99)
PHOSPHORUS: 3.7 mg/dL (ref 2.5–4.6)
POTASSIUM: 3.8 mmol/L (ref 3.5–5.1)
Sodium: 140 mmol/L (ref 135–145)

## 2015-11-07 LAB — CBC
HEMATOCRIT: 25.8 % — AB (ref 39.0–52.0)
HEMOGLOBIN: 8.3 g/dL — AB (ref 13.0–17.0)
MCH: 29.4 pg (ref 26.0–34.0)
MCHC: 32.2 g/dL (ref 30.0–36.0)
MCV: 91.5 fL (ref 78.0–100.0)
Platelets: 374 10*3/uL (ref 150–400)
RBC: 2.82 MIL/uL — AB (ref 4.22–5.81)
RDW: 15.7 % — AB (ref 11.5–15.5)
WBC: 4.5 10*3/uL (ref 4.0–10.5)

## 2015-11-07 LAB — GLUCOSE, CAPILLARY
GLUCOSE-CAPILLARY: 100 mg/dL — AB (ref 65–99)
GLUCOSE-CAPILLARY: 121 mg/dL — AB (ref 65–99)

## 2015-11-07 MED ORDER — TAMSULOSIN HCL 0.4 MG PO CAPS
0.4000 mg | ORAL_CAPSULE | Freq: Every day | ORAL | Status: DC
  Administered 2015-11-07: 0.4 mg via ORAL
  Filled 2015-11-07 (×2): qty 1

## 2015-11-07 MED ORDER — PANTOPRAZOLE SODIUM 40 MG PO TBEC: 40.0000 mg | DELAYED_RELEASE_TABLET | Freq: Two times a day (BID) | ORAL | Status: DC

## 2015-11-07 MED ORDER — TAMSULOSIN HCL 0.4 MG PO CAPS: 0.4000 mg | ORAL_CAPSULE | Freq: Every day | ORAL | Status: AC

## 2015-11-07 NOTE — Progress Notes (Addendum)
Pt noted alert but  with  TBI. Able to make needs known. Foley intact yellow drainage noted. Pt with no s/s of distress at this time. Bed in lowest position and call light and phone in reach.

## 2015-11-07 NOTE — Progress Notes (Signed)
Discharge instructions given to patient and patient mother.  Questions answered

## 2015-11-07 NOTE — Discharge Summary (Addendum)
Physician Discharge Summary  Zachary Thornton MAY:[id number] DOB: [birth year] DOA: 10/29/2015  PCP: [name], MD  Admit date: 10/29/2015 Discharge date: 11/07/2015  Time spent: 35 minutes  Recommendations for Outpatient Follow-up:  1. Please hold omega 3's for risk of bleeding 2. Needs cbc + bmet ~ 1 week 3. Consider PPI for at least 8 weeks and discuss with GI going forward when to stop 4. Currently at baseline and no hh needs identified   Discharge Diagnoses:  Principal Problem:   Syncope and collapse Active Problems:   Traumatic brain injury with depressed frontal skull fracture (HCC)   Cognitive deficit as late effect of traumatic brain injury [facility])   Episodic dyscontrol syndrome   Acute blood loss anemia   Hypotension   Urinary retention   Duodenal ulcer hemorrhage   Hypokalemia   Lactic acidosis   Bleeding duodenal ulcer   Discharge Condition: stable  Diet recommendation: regular  Filed Weights   10/31/15 0354 11/03/15 0500 11/04/15 0400  Weight: 84.3 kg (185 lb 13.6 oz) 90.8 kg (200 lb 2.8 oz) 88.9 kg (195 lb 15.8 oz)    History of present illness:  49 y.o. male the PMH of TBI admit 10/29/15 syncopal episode at church with brief loss of consciousness.  EMS was called and the patient was brought to the ED for further evaluation where he was found to be hypotensive with a hemoglobin of 6.4. Rectal exam revealed Hemoccult positive stools.  While in the ED, the patient had hematemesis and a large black stool. GI subsequently was consulted. EGD performed 11/7 = post bulbar duodenal ulcer Noted to have further drop in hemoglobin underwent repeat endoscopy 11/9 showing clean-based G ulceration large duodenal ulcer with visible bleeding and bruising which was treated with cautery and epinephrine Patient had large amounts of dark stool subsequent to that he was transferred to ICU Critical care, interventional radiology, general surgery were consulted Patient underwent coil  embolization of gastroduodenal artery for bleeding ulcer under interventional 11/9 His hemoglobin has stabilized over the course of 11/10-11/12 but bneeded trasnfusion  Hospital Course:  Syncope and collapse secondary to duodenal ulcer hemorrhage with hypotension/Acute blood loss anemia  - Status post 9 units of blood, aggressive IV fluids with stabilization of blood pressure. - Monitoring H&H Q 24 hours.  - EGD done by GI 10/30/15. Large ulcer found distal to the duodenal bulb with an adherent clot.  -further ? in Hb noted 11/01/15 am -Rpt EGD showed Clean basd GEJ ulceration + Large duodenal ulcer + oozing Rx Goldprobe + EPI and hemostasis -Further large bleed 11/9 necessitated coil embolization of GDA by IR -Patient has been transfused and hemoglobin stabilizing in the 7 range and needed i more PRBC unit 11/11 pm-hemoglobin 8.3 -transition from PPI infusion protonix 8cc/hr to protonix BID as per GI and continue this ongoing -GI placed patient on full regular diet 11/4 which he is toelrating well -hemoglobin stabilized and was 8.3 at time of d/c home   Urinary retention - Continues to have difficulty voiding.  -Continue indwelling catherter -clamp and void trial prior to d/c showed - Mobilize. -if needed will d/c with Flomax and will need OP urology input regarding this as well   Traumatic brain injury with depressed frontal skull fracture (HCC)/Cognitive deficit as late effect of traumatic brain injury (HCC)/Episodic dyscontrol syndrome - Propranolol remains on hold.  - Added Hydralazine 5 mg iv q4 BP >423 systolic - Continue Tegretol and Lexapro When able to take by mouth--will consider resumption of  meds in am  Impaired glucose tolerance -hold off coverage for right now  Hypokalemia -replacing with IV saline c 40 of K at 100 cc/hr-saline lock -rpt labs on 11/15 showed resolution of this  DVT prophylaxis - SCDs.  Procedures:  EGD x 2  Coiling of  GDA  Consultations:  GI  IR  Gen surgery  Discharge Exam: Filed Vitals:   11/07/15 0535  BP: 120/87  Pulse: 84  Temp: 98.7 F (37.1 C)  Resp: 18    General: alert pleasant oriented Cardiovascular: s1 s 2no m/r/g Respiratory: clear  abd soft nt nd no rebound no gaurding  Discharge Instructions   Discharge Instructions    Diet - low sodium heart healthy    Complete by:  As directed      Discharge instructions    Complete by:  As directed   Please follow up with Gastroenterology as per their instructions Get lab work as an out-patient with either primary Md of with gastroenterology Do not take omega 3 for right now as it has a blood thinning effect Make sure you take protonix 40 twice daily for protection of stomach and gastroenterology can advise you when to change or discontinue this     Increase activity slowly    Complete by:  As directed           Current Discharge Medication List    START taking these medications   Details  pantoprazole (PROTONIX) 40 MG tablet Take 1 tablet (40 mg total) by mouth 2 (two) times daily. Qty: 60 tablet, Refills: 0    tamsulosin (FLOMAX) 0.4 MG CAPS capsule Take 1 capsule (0.4 mg total) by mouth daily after breakfast. Qty: 30 capsule, Refills: 0      CONTINUE these medications which have NOT CHANGED   Details  carbamazepine (TEGRETOL) 200 MG tablet Take 600 mg by mouth 2 (two) times daily.    cholecalciferol (VITAMIN D) 1000 UNITS tablet Take 1,000 Units by mouth daily.    escitalopram (LEXAPRO) 20 MG tablet Take 20 mg by mouth daily.    Multiple Vitamin (MULTIVITAMIN WITH MINERALS) TABS Take 1 tablet by mouth daily.    propranolol (INDERAL) 60 MG tablet Take 120 mg by mouth 2 (two) times daily.      STOP taking these medications     fish oil-omega-3 fatty acids 1000 MG capsule        Allergies  Allergen Reactions  . Nuedexta [Dextromethorphan-Quinidine] Anaphylaxis    Lips began to swell  . Nsaids Other (See  Comments)    Due to GI bleeding   Follow-up Information    Follow up with Milus Banister, MD On 01/01/2016.   Specialty:  Gastroenterology   Why:  8:45 am   Contact information:   [address] [address] [phone]        The results of significant diagnostics from this hospitalization (including imaging, microbiology, ancillary and laboratory) are listed below for reference.    Significant Diagnostic Studies: Ir Angiogram Visceral Selective  11/02/2015  INDICATION: Bleeding duodenal ulcer with hemodynamic instability. Please perform mesenteric arteriogram and potential percutaneous embolization. EXAM: 1. ULTRASOUND GUIDANCE FOR ARTERIAL ACCESS 2. CELIAC AND SUPERIOR MESENTERIC ARTERIOGRAM (1st ORDER) 3. GASTRODUODENAL ARTERIOGRAM (3rd ORDER) AND PERCUTANEOUS COIL EMBOLIZATION COMPARISON:  None MEDICATIONS: None CONTRAST:  85 cc Omnipaque 300 ANESTHESIA/SEDATION: Fentanyl 50 mcg IV; Versed 1 mg IV Total Moderate Sedation Time 45 minutes FLUOROSCOPY TIME:  11 minutes 18 seconds (3,662 mGy) COMPLICATIONS: None immediate  ACCESS: Right common femoral artery; hemostasis achieved with manual compression. TECHNIQUE: Informed written consent was obtained from the patient after a discussion of the risks, benefits and alternatives to treatment. Questions regarding the procedure were encouraged and answered. A timeout was performed prior to the initiation of the procedure. The right groin was prepped and draped in the usual sterile fashion, and a sterile drape was applied covering the operative field. Maximum barrier sterile technique with sterile gowns and gloves were used for the procedure. A timeout was performed prior to the initiation of the procedure. Local anesthesia was provided with 1% lidocaine. The right femoral head was marked fluoroscopically. Under ultrasound guidance, the right common femoral artery was accessed with a micropuncture kit after the overlying soft tissues were  anesthetized with 1% lidocaine. An ultrasound image was saved for documentation purposes. The micropuncture sheath was exchanged for a 5 [place] vascular sheath over a Bentson wire. A closure arteriogram was performed through the side of the sheath confirming access within the right common femoral artery. Over a Bentson wire, a Mickelson catheter was advanced to the level of the thoracic aorta where it was back bled and flushed. The catheter was then utilized to select the celiac artery and a celiac arteriogram was performed. Utilizing a Fathom 14 microwire, a regular renegade micro catheter was utilized to select the gastroduodenal artery and a gastroduodenal arteriogram was performed. The gastroduodenal artery was percutaneously coiled embolized to near its origin. Post embolization arteriogram demonstrates complete occlusion of the gastroduodenal artery. The Mickelson catheter was then utilized to select the superior mesenteric artery and a superior mesenteric arteriogram was performed. Images reviewed and the procedure was terminated. All wires and catheters and sheaths were removed from the patient. Hemostasis was achieved at the right groin and access site with manual compression. A dressing was placed. The patient tolerated procedure well without immediate postprocedural complication. The patient was escorted to nuclear medicine department for planar imaging. 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 Otherwise, conventional branching pattern. No discrete areas of vessel irregularity or active extravasation are seen with special attention paid to the duodenal bulb. Sub selective gastroduodenal arteriogram demonstrates mild focal narrowing within the distal aspect of the GDA, again without discrete area of vessel irregularity or active extravasation. The gastroduodenal artery was  successfully percutaneous coil embolized from its distal aspect to near its origin. Completion celiac arteriogram demonstrates complete occlusion of the GDA. Superior mesenteric arteriogram demonstrates a replaced hepatic artery arising from the proximal SMA. There is no definitive hypertrophied supply to the duodenum or duodenal bulb. IMPRESSION: Technically successful prophylactic embolization of the gastroduodenal artery for bleeding duodenal ulcer. Electronically Signed   By: Sandi Mariscal M.D.   On: 11/02/2015 03:04   Ir Angiogram Visceral Selective  11/02/2015  INDICATION: Bleeding duodenal ulcer with hemodynamic instability. Please perform mesenteric arteriogram and potential percutaneous embolization. EXAM: 1. ULTRASOUND GUIDANCE FOR ARTERIAL ACCESS 2. CELIAC AND SUPERIOR MESENTERIC ARTERIOGRAM (1st ORDER) 3. GASTRODUODENAL ARTERIOGRAM (3rd ORDER) AND PERCUTANEOUS COIL EMBOLIZATION COMPARISON:  None MEDICATIONS: None CONTRAST:  85 cc Omnipaque 300 ANESTHESIA/SEDATION: Fentanyl 50 mcg IV; Versed 1 mg IV Total Moderate Sedation Time 45 minutes FLUOROSCOPY TIME:  11 minutes 18 seconds (1,610 mGy) COMPLICATIONS: None immediate ACCESS: Right common femoral artery; hemostasis achieved with manual compression. TECHNIQUE: Informed written consent was obtained from the patient after a discussion of the risks, benefits and  alternatives to treatment. Questions regarding the procedure were encouraged and answered. A timeout was performed prior to the initiation of the procedure. The right groin was prepped and draped in the usual sterile fashion, and a sterile drape was applied covering the operative field. Maximum barrier sterile technique with sterile gowns and gloves were used for the procedure. A timeout was performed prior to the initiation of the procedure. Local anesthesia was provided with 1% lidocaine. The right femoral head was marked fluoroscopically. Under ultrasound guidance, the right common femoral  artery was accessed with a micropuncture kit after the overlying soft tissues were anesthetized with 1% lidocaine. An ultrasound image was saved for documentation purposes. The micropuncture sheath was exchanged for a 5 [place] vascular sheath over a Bentson wire. A closure arteriogram was performed through the side of the sheath confirming access within the right common femoral artery. Over a Bentson wire, a Mickelson catheter was advanced to the level of the thoracic aorta where it was back bled and flushed. The catheter was then utilized to select the celiac artery and a celiac arteriogram was performed. Utilizing a Fathom 14 microwire, a regular renegade micro catheter was utilized to select the gastroduodenal artery and a gastroduodenal arteriogram was performed. The gastroduodenal artery was percutaneously coiled embolized to near its origin. Post embolization arteriogram demonstrates complete occlusion of the gastroduodenal artery. The Mickelson catheter was then utilized to select the superior mesenteric artery and a superior mesenteric arteriogram was performed. Images reviewed and the procedure was terminated. All wires and catheters and sheaths were removed from the patient. Hemostasis was achieved at the right groin and access site with manual compression. A dressing was placed. The patient tolerated procedure well without immediate postprocedural complication. The patient was escorted to nuclear medicine department for planar imaging. 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 Otherwise, conventional branching pattern. No discrete areas of vessel irregularity or active extravasation are seen with special attention paid to the duodenal bulb. Sub selective gastroduodenal arteriogram demonstrates mild focal narrowing within the distal aspect of the GDA, again without discrete  area of vessel irregularity or active extravasation. The gastroduodenal artery was successfully percutaneous coil embolized from its distal aspect to near its origin. Completion celiac arteriogram demonstrates complete occlusion of the GDA. Superior mesenteric arteriogram demonstrates a replaced hepatic artery arising from the proximal SMA. There is no definitive hypertrophied supply to the duodenum or duodenal bulb. IMPRESSION: Technically successful prophylactic embolization of the gastroduodenal artery for bleeding duodenal ulcer. Electronically Signed   By: Sandi Mariscal M.D.   On: 11/02/2015 03:04   Ir Angiogram Selective Each Additional Vessel  11/02/2015  INDICATION: Bleeding duodenal ulcer with hemodynamic instability. Please perform mesenteric arteriogram and potential percutaneous embolization. EXAM: 1. ULTRASOUND GUIDANCE FOR ARTERIAL ACCESS 2. CELIAC AND SUPERIOR MESENTERIC ARTERIOGRAM (1st ORDER) 3. GASTRODUODENAL ARTERIOGRAM (3rd ORDER) AND PERCUTANEOUS COIL EMBOLIZATION COMPARISON:  None MEDICATIONS: None CONTRAST:  85 cc Omnipaque 300 ANESTHESIA/SEDATION: Fentanyl 50 mcg IV; Versed 1 mg IV Total Moderate Sedation Time 45 minutes FLUOROSCOPY TIME:  11 minutes 18 seconds (6,384 mGy) COMPLICATIONS: None immediate ACCESS: Right common femoral artery; hemostasis achieved with manual compression. TECHNIQUE: Informed written consent was obtained from the patient after a discussion of the risks, benefits and alternatives to treatment. Questions regarding the procedure were encouraged and answered. A timeout was performed prior to the initiation of the procedure. The right groin  was prepped and draped in the usual sterile fashion, and a sterile drape was applied covering the operative field. Maximum barrier sterile technique with sterile gowns and gloves were used for the procedure. A timeout was performed prior to the initiation of the procedure. Local anesthesia was provided with 1% lidocaine. The right  femoral head was marked fluoroscopically. Under ultrasound guidance, the right common femoral artery was accessed with a micropuncture kit after the overlying soft tissues were anesthetized with 1% lidocaine. An ultrasound image was saved for documentation purposes. The micropuncture sheath was exchanged for a 5 [place] vascular sheath over a Bentson wire. A closure arteriogram was performed through the side of the sheath confirming access within the right common femoral artery. Over a Bentson wire, a Mickelson catheter was advanced to the level of the thoracic aorta where it was back bled and flushed. The catheter was then utilized to select the celiac artery and a celiac arteriogram was performed. Utilizing a Fathom 14 microwire, a regular renegade micro catheter was utilized to select the gastroduodenal artery and a gastroduodenal arteriogram was performed. The gastroduodenal artery was percutaneously coiled embolized to near its origin. Post embolization arteriogram demonstrates complete occlusion of the gastroduodenal artery. The Mickelson catheter was then utilized to select the superior mesenteric artery and a superior mesenteric arteriogram was performed. Images reviewed and the procedure was terminated. All wires and catheters and sheaths were removed from the patient. Hemostasis was achieved at the right groin and access site with manual compression. A dressing was placed. The patient tolerated procedure well without immediate postprocedural complication. The patient was escorted to nuclear medicine department for planar imaging. 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 Otherwise, conventional branching pattern. No discrete areas of vessel irregularity or active extravasation are seen with special attention paid to the duodenal bulb. Sub selective gastroduodenal arteriogram  demonstrates mild focal narrowing within the distal aspect of the GDA, again without discrete area of vessel irregularity or active extravasation. The gastroduodenal artery was successfully percutaneous coil embolized from its distal aspect to near its origin. Completion celiac arteriogram demonstrates complete occlusion of the GDA. Superior mesenteric arteriogram demonstrates a replaced hepatic artery arising from the proximal SMA. There is no definitive hypertrophied supply to the duodenum or duodenal bulb. IMPRESSION: Technically successful prophylactic embolization of the gastroduodenal artery for bleeding duodenal ulcer. Electronically Signed   By: Sandi Mariscal M.D.   On: 11/02/2015 03:04   Ir US Guide Vasc Access Right  11/02/2015  INDICATION: Bleeding duodenal ulcer with hemodynamic instability. Please perform mesenteric arteriogram and potential percutaneous embolization. EXAM: 1. ULTRASOUND GUIDANCE FOR ARTERIAL ACCESS 2. CELIAC AND SUPERIOR MESENTERIC ARTERIOGRAM (1st ORDER) 3. GASTRODUODENAL ARTERIOGRAM (3rd ORDER) AND PERCUTANEOUS COIL EMBOLIZATION COMPARISON:  None MEDICATIONS: None CONTRAST:  85 cc Omnipaque 300 ANESTHESIA/SEDATION: Fentanyl 50 mcg IV; Versed 1 mg IV Total Moderate Sedation Time 45 minutes FLUOROSCOPY TIME:  11 minutes 18 seconds (5,681 mGy) COMPLICATIONS: None immediate ACCESS: Right common femoral artery; hemostasis achieved with manual compression. TECHNIQUE: Informed written consent was obtained from the patient after a discussion of the risks, benefits and alternatives to treatment. Questions regarding the procedure were encouraged and answered. A timeout was performed prior to the initiation of the procedure. The right groin was prepped and draped in the usual sterile fashion, and a sterile drape was applied covering the operative field. Maximum barrier sterile technique with sterile  gowns and gloves were used for the procedure. A timeout was performed prior to the initiation  of the procedure. Local anesthesia was provided with 1% lidocaine. The right femoral head was marked fluoroscopically. Under ultrasound guidance, the right common femoral artery was accessed with a micropuncture kit after the overlying soft tissues were anesthetized with 1% lidocaine. An ultrasound image was saved for documentation purposes. The micropuncture sheath was exchanged for a 5 [place] vascular sheath over a Bentson wire. A closure arteriogram was performed through the side of the sheath confirming access within the right common femoral artery. Over a Bentson wire, a Mickelson catheter was advanced to the level of the thoracic aorta where it was back bled and flushed. The catheter was then utilized to select the celiac artery and a celiac arteriogram was performed. Utilizing a Fathom 14 microwire, a regular renegade micro catheter was utilized to select the gastroduodenal artery and a gastroduodenal arteriogram was performed. The gastroduodenal artery was percutaneously coiled embolized to near its origin. Post embolization arteriogram demonstrates complete occlusion of the gastroduodenal artery. The Mickelson catheter was then utilized to select the superior mesenteric artery and a superior mesenteric arteriogram was performed. Images reviewed and the procedure was terminated. All wires and catheters and sheaths were removed from the patient. Hemostasis was achieved at the right groin and access site with manual compression. A dressing was placed. The patient tolerated procedure well without immediate postprocedural complication. The patient was escorted to nuclear medicine department for planar imaging. 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 Otherwise, conventional branching pattern. No discrete areas of vessel irregularity or active extravasation are seen with special  attention paid to the duodenal bulb. Sub selective gastroduodenal arteriogram demonstrates mild focal narrowing within the distal aspect of the GDA, again without discrete area of vessel irregularity or active extravasation. The gastroduodenal artery was successfully percutaneous coil embolized from its distal aspect to near its origin. Completion celiac arteriogram demonstrates complete occlusion of the GDA. Superior mesenteric arteriogram demonstrates a replaced hepatic artery arising from the proximal SMA. There is no definitive hypertrophied supply to the duodenum or duodenal bulb. IMPRESSION: Technically successful prophylactic embolization of the gastroduodenal artery for bleeding duodenal ulcer. Electronically Signed   By: Sandi Mariscal M.D.   On: 11/02/2015 03:04   Dg Abd Acute W/chest  11/01/2015  CLINICAL DATA:  Perforated duodenal ulcer with hemorrhage (HCC) K26.6 (ICD-10-CM) EXAM: DG ABDOMEN ACUTE W/ 1V CHEST COMPARISON:  None. FINDINGS: There is no bowel dilation, but there scattered air-fluid levels on the decubitus view. No convincing free intraperitoneal air. No evidence of renal or ureteral stones. Soft tissues are unremarkable. Chest radiograph shows unremarkable heart, mediastinum and hila and clear lungs. A ventriculoperitoneal shunt crosses anterior chest extending into the right upper quadrant of the abdomen. IMPRESSION: 1. No convincing free air on the decubitus view. 2. No evidence of bowel obstruction. 3. Scattered air-fluid levels on the decubitus view suggests an adynamic ileus. 4. No active disease of the chest. Electronically Signed   By: Lajean Manes M.D.   On: 11/01/2015 18:11   [place] Guide [employer]  11/02/2015  INDICATION: Bleeding duodenal ulcer with hemodynamic instability. Please perform mesenteric arteriogram and potential percutaneous embolization. EXAM: 1. ULTRASOUND GUIDANCE FOR ARTERIAL ACCESS 2. CELIAC AND SUPERIOR MESENTERIC  ARTERIOGRAM (1st ORDER) 3. GASTRODUODENAL ARTERIOGRAM (3rd ORDER) AND  PERCUTANEOUS COIL EMBOLIZATION COMPARISON:  None MEDICATIONS: None CONTRAST:  85 cc Omnipaque 300 ANESTHESIA/SEDATION: Fentanyl 50 mcg IV; Versed 1 mg IV Total Moderate Sedation Time 45 minutes FLUOROSCOPY TIME:  11 minutes 18 seconds (5,830 mGy) COMPLICATIONS: None immediate ACCESS: Right common femoral artery; hemostasis achieved with manual compression. TECHNIQUE: Informed written consent was obtained from the patient after a discussion of the risks, benefits and alternatives to treatment. Questions regarding the procedure were encouraged and answered. A timeout was performed prior to the initiation of the procedure. The right groin was prepped and draped in the usual sterile fashion, and a sterile drape was applied covering the operative field. Maximum barrier sterile technique with sterile gowns and gloves were used for the procedure. A timeout was performed prior to the initiation of the procedure. Local anesthesia was provided with 1% lidocaine. The right femoral head was marked fluoroscopically. Under ultrasound guidance, the right common femoral artery was accessed with a micropuncture kit after the overlying soft tissues were anesthetized with 1% lidocaine. An ultrasound image was saved for documentation purposes. The micropuncture sheath was exchanged for a 5 [place] vascular sheath over a Bentson wire. A closure arteriogram was performed through the side of the sheath confirming access within the right common femoral artery. Over a Bentson wire, a Mickelson catheter was advanced to the level of the thoracic aorta where it was back bled and flushed. The catheter was then utilized to select the celiac artery and a celiac arteriogram was performed. Utilizing a Fathom 14 microwire, a regular renegade micro catheter was utilized to select the gastroduodenal artery and a gastroduodenal arteriogram was performed. The gastroduodenal artery was  percutaneously coiled embolized to near its origin. Post embolization arteriogram demonstrates complete occlusion of the gastroduodenal artery. The Mickelson catheter was then utilized to select the superior mesenteric artery and a superior mesenteric arteriogram was performed. Images reviewed and the procedure was terminated. All wires and catheters and sheaths were removed from the patient. Hemostasis was achieved at the right groin and access site with manual compression. A dressing was placed. The patient tolerated procedure well without immediate postprocedural complication. The patient was escorted to nuclear medicine department for planar imaging. 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 Otherwise, conventional branching pattern. No discrete areas of vessel irregularity or active extravasation are seen with special attention paid to the duodenal bulb. Sub selective gastroduodenal arteriogram demonstrates mild focal narrowing within the distal aspect of the GDA, again without discrete area of vessel irregularity or active extravasation. The gastroduodenal artery was successfully percutaneous coil embolized from its distal aspect to near its origin. Completion celiac arteriogram demonstrates complete occlusion of the GDA. Superior mesenteric arteriogram demonstrates a replaced hepatic artery arising from the proximal SMA. There is no definitive hypertrophied supply to the duodenum or duodenal bulb. IMPRESSION: Technically successful prophylactic embolization of the gastroduodenal artery for bleeding duodenal ulcer. Electronically Signed   By: Sandi Mariscal M.D.   On: 11/02/2015 03:04    Microbiology: Recent Results (from the past 240 hour(s))  MRSA PCR Screening     Status: None   Collection Time: 10/29/15  6:30 PM  Result Value Ref Range Status   MRSA by PCR NEGATIVE  NEGATIVE Final    Comment:        The GeneXpert MRSA Assay (FDA approved for NASAL specimens only), is one component of a comprehensive MRSA colonization  surveillance program. It is not intended to diagnose MRSA infection nor to guide or monitor treatment for MRSA infections.      Labs: Basic Metabolic Panel:  Recent Labs Lab 11/01/15 2050 11/02/15 0210 11/03/15 0430 11/05/15 0352 11/07/15 0455  NA 141 139 140 141 140  K 3.9 4.0 3.4* 3.0* 3.8  CL 118* 114* 115* 110 110  CO2 20* 18* 22 26 26   GLUCOSE 165* 152* 105* 99 110*  BUN 18 16 11 7 6   CREATININE 0.88 0.83 0.59* 0.63 0.69  CALCIUM 6.6* 7.2* 7.2* 7.4* 8.1*  MG  --  1.6*  --   --   --   PHOS  --   --   --   --  3.7   Liver Function Tests:  Recent Labs Lab 11/01/15 1658 11/07/15 0455  AST 15  --   ALT 9*  --   ALKPHOS 34*  --   BILITOT 0.4  --   PROT 3.1*  --   ALBUMIN 1.6* 2.0*   No results for input(s): LIPASE, AMYLASE in the last 168 hours. No results for input(s): AMMONIA in the last 168 hours. CBC:  Recent Labs Lab 11/01/15 1658  11/03/15 0052  11/04/15 0428 11/04/15 1047 11/05/15 0352 11/06/15 0540 11/07/15 0455  WBC 11.2*  < > 7.3  --  5.0  --  4.6 4.4 4.5  NEUTROABS 9.1*  --   --   --   --   --  2.8 2.8  --   HGB 6.6*  < > 7.6*  < > 7.9* 8.5* 8.3* 8.1* 8.3*  HCT 19.8*  < > 22.0*  < > 23.3* 25.0* 24.9* 24.7* 25.8*  MCV 90.4  < > 87.0  --  88.3  --  90.2 89.5 91.5  PLT 165  < > 151  --  171  --  236 310 374  < > = values in this interval not displayed. Cardiac Enzymes:  Recent Labs Lab 11/02/15 0210 11/02/15 0833 11/02/15 1420  TROPONINI <0.03 <0.03 <0.03   BNP: BNP (last 3 results) No results for input(s): BNP in the last 8760 hours.  ProBNP (last 3 results) No results for input(s): PROBNP in the last 8760 hours.  CBG:  Recent Labs Lab 11/06/15 0805 11/06/15 1140 11/06/15 1559 11/06/15 2158 11/07/15 0747  GLUCAP 117* 115* 109* 125* 100*        Signed:  Nita Sells  [facility] 11/07/2015, 8:34 AM

## 2015-11-07 NOTE — Progress Notes (Signed)
Patient voided 150cc of urine upon removal of foley catheter.  Flomax began

## 2015-11-07 NOTE — Progress Notes (Signed)
Patient has been unable to void since foley removed except 150 cc.  Bladder scan was for 600cc.  Dr Mahala MenghiniSamtani made aware of the above.  Order to replace catheter and make appointment with urology for foley follow up.  Foley catheter care education provided to patient and patient mother along with how to empty foley and apply leg bag.  Mother states understanding

## 2015-11-13 DIAGNOSIS — K279 Peptic ulcer, site unspecified, unspecified as acute or chronic, without hemorrhage or perforation: Secondary | ICD-10-CM | POA: Diagnosis not present

## 2015-11-13 DIAGNOSIS — F419 Anxiety disorder, unspecified: Secondary | ICD-10-CM | POA: Diagnosis not present

## 2015-11-13 DIAGNOSIS — R338 Other retention of urine: Secondary | ICD-10-CM | POA: Diagnosis not present

## 2015-11-13 DIAGNOSIS — D649 Anemia, unspecified: Secondary | ICD-10-CM | POA: Diagnosis not present

## 2015-11-13 DIAGNOSIS — S069X0S Unspecified intracranial injury without loss of consciousness, sequela: Secondary | ICD-10-CM | POA: Diagnosis not present

## 2015-11-14 DIAGNOSIS — N390 Urinary tract infection, site not specified: Secondary | ICD-10-CM | POA: Diagnosis not present

## 2015-11-14 DIAGNOSIS — R338 Other retention of urine: Secondary | ICD-10-CM | POA: Diagnosis not present

## 2015-11-14 DIAGNOSIS — N401 Enlarged prostate with lower urinary tract symptoms: Secondary | ICD-10-CM | POA: Diagnosis not present

## 2015-11-22 ENCOUNTER — Encounter: Payer: Self-pay | Admitting: Gastroenterology

## 2015-11-27 DIAGNOSIS — R338 Other retention of urine: Secondary | ICD-10-CM | POA: Diagnosis not present

## 2015-11-27 DIAGNOSIS — N401 Enlarged prostate with lower urinary tract symptoms: Secondary | ICD-10-CM | POA: Diagnosis not present

## 2015-11-28 ENCOUNTER — Ambulatory Visit: Payer: Medicare Other | Admitting: Gastroenterology

## 2015-12-06 DIAGNOSIS — R338 Other retention of urine: Secondary | ICD-10-CM | POA: Diagnosis not present

## 2015-12-06 DIAGNOSIS — S069X0S Unspecified intracranial injury without loss of consciousness, sequela: Secondary | ICD-10-CM | POA: Diagnosis not present

## 2015-12-06 DIAGNOSIS — K279 Peptic ulcer, site unspecified, unspecified as acute or chronic, without hemorrhage or perforation: Secondary | ICD-10-CM | POA: Diagnosis not present

## 2015-12-06 DIAGNOSIS — D6489 Other specified anemias: Secondary | ICD-10-CM | POA: Diagnosis not present

## 2015-12-06 DIAGNOSIS — F419 Anxiety disorder, unspecified: Secondary | ICD-10-CM | POA: Diagnosis not present

## 2015-12-06 DIAGNOSIS — Z6825 Body mass index (BMI) 25.0-25.9, adult: Secondary | ICD-10-CM | POA: Diagnosis not present

## 2015-12-12 DIAGNOSIS — R945 Abnormal results of liver function studies: Secondary | ICD-10-CM | POA: Diagnosis not present

## 2015-12-12 DIAGNOSIS — D6489 Other specified anemias: Secondary | ICD-10-CM | POA: Diagnosis not present

## 2015-12-25 ENCOUNTER — Encounter (HOSPITAL_COMMUNITY): Payer: Self-pay

## 2015-12-25 ENCOUNTER — Emergency Department (HOSPITAL_COMMUNITY)
Admission: EM | Admit: 2015-12-25 | Discharge: 2015-12-25 | Disposition: A | Discharge status: Home / Self Care | Payer: Medicare Other | Attending: Emergency Medicine | Admitting: Emergency Medicine

## 2015-12-25 DIAGNOSIS — R55 Syncope and collapse: Secondary | ICD-10-CM | POA: Diagnosis not present

## 2015-12-25 DIAGNOSIS — Z87891 Personal history of nicotine dependence: Secondary | ICD-10-CM | POA: Insufficient documentation

## 2015-12-25 DIAGNOSIS — Z87828 Personal history of other (healed) physical injury and trauma: Secondary | ICD-10-CM | POA: Diagnosis not present

## 2015-12-25 DIAGNOSIS — F0391 Unspecified dementia with behavioral disturbance: Secondary | ICD-10-CM | POA: Diagnosis not present

## 2015-12-25 DIAGNOSIS — Z862 Personal history of diseases of the blood and blood-forming organs and certain disorders involving the immune mechanism: Secondary | ICD-10-CM | POA: Diagnosis not present

## 2015-12-25 DIAGNOSIS — Z87820 Personal history of traumatic brain injury: Secondary | ICD-10-CM | POA: Insufficient documentation

## 2015-12-25 DIAGNOSIS — Z8719 Personal history of other diseases of the digestive system: Secondary | ICD-10-CM | POA: Diagnosis not present

## 2015-12-25 DIAGNOSIS — R404 Transient alteration of awareness: Secondary | ICD-10-CM | POA: Diagnosis not present

## 2015-12-25 DIAGNOSIS — Z79899 Other long term (current) drug therapy: Secondary | ICD-10-CM | POA: Insufficient documentation

## 2015-12-25 DIAGNOSIS — R471 Dysarthria and anarthria: Secondary | ICD-10-CM | POA: Diagnosis not present

## 2015-12-25 LAB — CBC WITH DIFFERENTIAL/PLATELET
BASOS PCT: 0 %
Basophils Absolute: 0 10*3/uL (ref 0.0–0.1)
EOS ABS: 0 10*3/uL (ref 0.0–0.7)
EOS PCT: 0 %
HCT: 31.4 % — ABNORMAL LOW (ref 39.0–52.0)
Hemoglobin: 9.8 g/dL — ABNORMAL LOW (ref 13.0–17.0)
Lymphocytes Relative: 15 %
Lymphs Abs: 1.1 10*3/uL (ref 0.7–4.0)
MCH: 25.8 pg — ABNORMAL LOW (ref 26.0–34.0)
MCHC: 31.2 g/dL (ref 30.0–36.0)
MCV: 82.6 fL (ref 78.0–100.0)
MONO ABS: 0.3 10*3/uL (ref 0.1–1.0)
MONOS PCT: 5 %
Neutro Abs: 5.8 10*3/uL (ref 1.7–7.7)
Neutrophils Relative %: 80 %
PLATELETS: 308 10*3/uL (ref 150–400)
RBC: 3.8 MIL/uL — ABNORMAL LOW (ref 4.22–5.81)
RDW: 15.4 % (ref 11.5–15.5)
WBC: 7.2 10*3/uL (ref 4.0–10.5)

## 2015-12-25 LAB — COMPREHENSIVE METABOLIC PANEL
ALBUMIN: 4.1 g/dL (ref 3.5–5.0)
ALT: 28 U/L (ref 17–63)
ANION GAP: 9 (ref 5–15)
AST: 37 U/L (ref 15–41)
Alkaline Phosphatase: 73 U/L (ref 38–126)
BILIRUBIN TOTAL: 0.7 mg/dL (ref 0.3–1.2)
BUN: 11 mg/dL (ref 6–20)
CO2: 24 mmol/L (ref 22–32)
Calcium: 8.9 mg/dL (ref 8.9–10.3)
Chloride: 105 mmol/L (ref 101–111)
Creatinine, Ser: 0.85 mg/dL (ref 0.61–1.24)
GFR calc Af Amer: >60 mL/min (ref >60)
GFR calc non Af Amer: >60 mL/min (ref >60)
GLUCOSE: 117 mg/dL — AB (ref 65–99)
POTASSIUM: 3.7 mmol/L (ref 3.5–5.1)
SODIUM: 138 mmol/L (ref 135–145)
TOTAL PROTEIN: 6.9 g/dL (ref 6.5–8.1)

## 2015-12-25 NOTE — ED Notes (Signed)
Pt presents via EMS with c/o near syncope. Per friend, pt was working out and was in [employer], building [employer]he elevator [place] [employer] and the cousin said the pt appeared to be stumbling and had to catch the patient from falling. Per EMS, no trauma noted. Developmental impairments are normal for pt r/t head trauma years ago. 20g IV placed by EMS. Pt denies any dizziness, did not hit his head.

## 2015-12-25 NOTE — Discharge Instructions (Signed)
Your hemoglobin was improved today to 9.8. Your liver tests were normal. You probably just got overheated today from walking. Make sure that you're drinking plenty of fluids and eating 3 meals each day. Return here or see your Dr. for problems.   Near-Syncope Near-syncope (commonly known as near fainting) is sudden weakness, dizziness, or feeling like you might pass out. During an episode of near-syncope, you may also develop pale skin, have tunnel vision, or feel sick to your stomach (nauseous). Near-syncope may occur when getting up after sitting or while standing for a long time. It is caused by a sudden decrease in blood flow to the brain. This decrease can result from various causes or triggers, most of which are not serious. However, because near-syncope can sometimes be a sign of something serious, a medical evaluation is required. The specific cause is often not determined. HOME CARE INSTRUCTIONS  Monitor your condition for any changes. The following actions may help to alleviate any discomfort you are experiencing:  Have someone stay with you until you feel stable.  Lie down right away and prop your feet up if you start feeling like you might faint. Breathe deeply and steadily. Wait until all the symptoms have passed. Most of these episodes last only a few minutes. You may feel tired for several hours.   Drink enough fluids to keep your urine clear or pale yellow.   If you are taking blood pressure or heart medicine, get up slowly when seated or lying down. Take several minutes to sit and then stand. This can reduce dizziness.  Follow up with your health care provider as directed. SEEK IMMEDIATE MEDICAL CARE IF:   You have a severe headache.   You have unusual pain in the chest, abdomen, or back.   You are bleeding from the mouth or rectum, or you have black or tarry stool.   You have an irregular or very fast heartbeat.   You have repeated fainting or have seizure-like  jerking during an episode.   You faint when sitting or lying down.   You have confusion.   You have difficulty walking.   You have severe weakness.   You have vision problems.  MAKE SURE YOU:   Understand these instructions.  Will watch your condition.  Will get help right away if you are not doing well or get worse.   This information is not intended to replace advice given to you by your health care provider. Make sure you discuss any questions you have with your health care provider.   Document Released: 12/09/2005 Document Revised: 12/14/2013 Document Reviewed: 05/14/2013 Elsevier Interactive Patient Education [employer]2016 Elsevier Inc.

## 2015-12-25 NOTE — ED Provider Notes (Signed)
CSN: [id number]     Arrival date & time 12/25/15  1231 History   First MD Initiated Contact with Patient 12/25/15 1600     Chief Complaint  Patient presents with  . Near Syncope     (Consider location/radiation/quality/duration/timing/severity/associated sxs/prior Treatment) HPI   Brandt LoosenScott Schear is a 50 y.o. male presents for evaluation of a sensation of near syncope associated with feeling hot. He just completed walking about three quarters of a [facility] [employer]. He walks frequently. He has been eating well and taking his usual medications. He was hospitalized about 6 weeks ago with a bleeding ulcer, requiring treatment with blood transfusions, multiple. At this time. He feels back to normal. He has been following with his PCP since hospital discharge and told that his blood counts are doing well, with the exception of some "liver problems". He is here with his mother who gives some of the history. There are no other known modifying factors.   Past Medical History  Diagnosis Date  . TBI (traumatic brain injury) (HCC)   . Back injury   . Dementia with behavioral disturbance   . Duodenal ulcer hemorrhage   . GI bleed   . Blood loss anemia    Past Surgical History  Procedure Laterality Date  . Csf shunt    . Craniotomy      Post TBI craniotomy and plate insertion  . Esophagogastroduodenoscopy (egd) with propofol N/A 10/30/2015    Procedure: ESOPHAGOGASTRODUODENOSCOPY (EGD) WITH PROPOFOL;  Surgeon: Rachael Feeaniel P Jacobs, MD;  Location: [facility];  Service: Endoscopy;  Laterality: N/A;  . Esophagogastroduodenoscopy N/A 11/01/2015    Procedure: ESOPHAGOGASTRODUODENOSCOPY (EGD);  Surgeon: Ruffin FrederickSteven Paul Armbruster, MD;  [facility];  Service: Gastroenterology;  Laterality: N/A;   Family History  Problem Relation Age of Onset  . Heart disease Father     MVR  . Hypertension Mother    Social History  Substance Use Topics  . Smoking status: Former Games developermoker  . Smokeless tobacco:  Never Used  . Alcohol Use: No    Review of Systems  All other systems reviewed and are negative.     Allergies  Nuedexta and Nsaids  Home Medications   Prior to Admission medications   Medication Sig Start Date End Date Taking? Authorizing Provider  carbamazepine (TEGRETOL) 200 MG tablet Take 600 mg by mouth 2 (two) times daily.   Yes Historical Provider, MD  cholecalciferol (VITAMIN D) 1000 UNITS tablet Take 1,000 Units by mouth daily.   Yes Historical Provider, MD  escitalopram (LEXAPRO) 20 MG tablet Take 20 mg by mouth daily.   Yes Historical Provider, MD  Multiple Vitamin (MULTIVITAMIN WITH MINERALS) TABS Take 1 tablet by mouth daily.   Yes Historical Provider, MD  pantoprazole (PROTONIX) 40 MG tablet Take 1 tablet (40 mg total) by mouth 2 (two) times daily. 11/07/15  Yes Rhetta MuraJai-Gurmukh Samtani, MD  tamsulosin (FLOMAX) 0.4 MG CAPS capsule Take 1 capsule (0.4 mg total) by mouth daily after breakfast. 11/07/15  Yes Jai-Gurmukh Samtani, MD   BP 130/83 mmHg  Pulse 101  Temp(Src) 98.4 F (36.9 C) (Oral)  Resp 18  SpO2 95% Physical Exam  Constitutional: He is oriented to person, place, and time. He appears well-developed and well-nourished.  HENT:  Head: Normocephalic and atraumatic.  Right Ear: External ear normal.  Left Ear: External ear normal.  All frontal skull injury.  Eyes: Conjunctivae and EOM are normal. Pupils are equal, round, and reactive to light.  Neck: Normal range of  motion and phonation normal. Neck supple.  Cardiovascular: Normal rate, regular rhythm and normal heart sounds.   Pulmonary/Chest: Effort normal and breath sounds normal. He exhibits no bony tenderness.  Abdominal: Soft. There is no tenderness.  Musculoskeletal: Normal range of motion.  Neurological: He is alert and oriented to person, place, and time. No cranial nerve deficit or sensory deficit. He exhibits normal muscle tone. Coordination normal.  Mild dysarthria, probably at baseline. Normal  strength in arms and legs bilaterally. Alert and responsive.  Skin: Skin is warm, dry and intact.  Psychiatric: He has a normal mood and affect. His behavior is normal. Judgment and thought content normal.  Nursing note and vitals reviewed.   ED Course  Procedures (including critical care time)  Medications - No data to display  Patient Vitals for the past 24 hrs:  BP Temp Temp src Pulse Resp SpO2  12/25/15 1745 130/83 mmHg - - 101 18 -  12/25/15 1639 129/80 mmHg 98.4 F (36.9 C) - 90 14 95 %  12/25/15 1237 117/80 mmHg 98.1 F (36.7 C) Oral 94 18 99 %      Labs Review Labs Reviewed  CBC WITH DIFFERENTIAL/PLATELET - Abnormal; Notable for the following:    RBC 3.80 (*)    Hemoglobin 9.8 (*)    HCT 31.4 (*)    MCH 25.8 (*)    All other components within normal limits  COMPREHENSIVE METABOLIC PANEL - Abnormal; Notable for the following:    Glucose, Bld 117 (*)    All other components within normal limits    Imaging Review No results found. I have personally reviewed and evaluated these images and lab results as part of my medical decision-making.   EKG Interpretation None      MDM   Final diagnoses:  Near syncope    Near syncope, without adverse findings on evaluation. His hemoglobin has improved, from recent blood loss secondary to ulcer. Doubt ACS, PE or pneumonia.  Nursing Notes Reviewed/ Care Coordinated Applicable Imaging Reviewed Interpretation of Laboratory Data incorporated into ED treatment  The patient appears reasonably screened and/or stabilized for discharge and I doubt any other medical condition or other [facility] requiring further screening, evaluation, or treatment in the ED at this time prior to discharge.  Plan: Home Medications- usual; Home Treatments- rest; return here if the recommended treatment, does not improve the symptoms; Recommended follow up- PCP prn     Mancel Bale, MD 12/26/15 0008

## 2015-12-25 NOTE — ED Notes (Signed)
Bed: WTR5 Expected date:  Expected time:  Means of arrival:  Comments: EMS-syncope after exercise-triage

## 2016-01-01 ENCOUNTER — Other Ambulatory Visit: Payer: Self-pay | Admitting: Gastroenterology

## 2016-01-01 ENCOUNTER — Ambulatory Visit: Payer: Medicare Other | Admitting: Gastroenterology

## 2016-01-01 MED ORDER — PANTOPRAZOLE SODIUM 40 MG PO TBEC: 40.0000 mg | DELAYED_RELEASE_TABLET | Freq: Two times a day (BID) | ORAL | Status: DC

## 2016-01-01 NOTE — Telephone Encounter (Signed)
Script sent to pharmacy and pts mother aware.

## 2016-02-21 ENCOUNTER — Other Ambulatory Visit (INDEPENDENT_AMBULATORY_CARE_PROVIDER_SITE_OTHER): Payer: Medicare Other

## 2016-02-21 ENCOUNTER — Encounter: Payer: Self-pay | Admitting: Gastroenterology

## 2016-02-21 ENCOUNTER — Ambulatory Visit (INDEPENDENT_AMBULATORY_CARE_PROVIDER_SITE_OTHER): Payer: Medicare Other | Admitting: Gastroenterology

## 2016-02-21 VITALS — BP 110/74 | HR 90 | Ht 70.0 in | Wt 178.0 lb

## 2016-02-21 DIAGNOSIS — K269 Duodenal ulcer, unspecified as acute or chronic, without hemorrhage or perforation: Secondary | ICD-10-CM

## 2016-02-21 LAB — CBC WITH DIFFERENTIAL/PLATELET
Basophils Absolute: 0 10*3/uL (ref 0.0–0.1)
Basophils Relative: 0.7 % (ref 0.0–3.0)
EOS PCT: 3.1 % (ref 0.0–5.0)
Eosinophils Absolute: 0.2 10*3/uL (ref 0.0–0.7)
HCT: 32.9 % — ABNORMAL LOW (ref 39.0–52.0)
Hemoglobin: 10.7 g/dL — ABNORMAL LOW (ref 13.0–17.0)
LYMPHS ABS: 1.4 10*3/uL (ref 0.7–4.0)
Lymphocytes Relative: 27.4 % (ref 12.0–46.0)
MCHC: 32.4 g/dL (ref 30.0–36.0)
MCV: 73.4 fl — AB (ref 78.0–100.0)
MONOS PCT: 9.5 % (ref 3.0–12.0)
Monocytes Absolute: 0.5 10*3/uL (ref 0.1–1.0)
NEUTROS ABS: 3.1 10*3/uL (ref 1.4–7.7)
NEUTROS PCT: 59.3 % (ref 43.0–77.0)
PLATELETS: 331 10*3/uL (ref 150.0–400.0)
RBC: 4.48 Mil/uL (ref 4.22–5.81)
RDW: 18.5 % — ABNORMAL HIGH (ref 11.5–15.5)
WBC: 5.2 10*3/uL (ref 4.0–10.5)

## 2016-02-21 NOTE — Patient Instructions (Signed)
Stay on pantoprazole (protonix) one pill twice daily. You will have labs checked today in the basement lab.  Please head down after you check out with the front desk  (cbc). You will be set up for an upper endoscopy to check healing of ulcer.

## 2016-02-21 NOTE — Progress Notes (Signed)
Review of pertinent gastrointestinal problems: 1. Duodenal ulcer bleeding (recieved 9 units blood during hospitalization 10/2015). Large, bulbar duodenal ulcer noted after he presented with melena. Initial EGD Dr. Christella Hartigan 10/2015 found significant peri-ulcer edema. The ulcer was not bleeding at that time however 2 days later he started to bleed and he underwent repeat EGD, Dr. Adela Lank. Dr. Adela Lank found a large bleeding ulcer and treated it with electrocautery and Endo clipping. Later that night he started to have significant rebleeding and he underwent prophylactic coil embolization of the GDA with IR.  Biopsies of the stomach show no H. pylori and H. pylori serologies were also negative.   He had not been taking aspirin or NSAIDs  HPI: This is a very pleasant 50 year old man who is here with his mother today. I last saw on about 3 months ago the time of massive GI bleeding from a duodenal ulcer  Chief complaint is duodenal ulcer  No vomiting, eating well,   Brown stools.  No abdominal pains.  He had atraumatic brain injury 5 years ago after falling down some stairs. Seems that his mother is caring for him since then. Mother is with him.    Past Medical History  Diagnosis Date  . TBI (traumatic brain injury) (HCC)   . Back injury   . Dementia with behavioral disturbance   . Duodenal ulcer hemorrhage   . GI bleed   . Blood loss anemia     Past Surgical History  Procedure Laterality Date  . Csf shunt    . Craniotomy      Post TBI craniotomy and plate insertion  . Esophagogastroduodenoscopy (egd) with propofol N/A 10/30/2015    Procedure: ESOPHAGOGASTRODUODENOSCOPY (EGD) WITH PROPOFOL;  Surgeon: Rachael Fee, MD;  Location: [facility];  Service: Endoscopy;  Laterality: N/A;  . Esophagogastroduodenoscopy N/A 11/01/2015    Procedure: ESOPHAGOGASTRODUODENOSCOPY (EGD);  Surgeon: Ruffin Frederick, MD;  [facility];  Service: Gastroenterology;  Laterality: N/A;     Current Outpatient Prescriptions  Medication Sig Dispense Refill  . carbamazepine (TEGRETOL) 200 MG tablet Take 600 mg by mouth 2 (two) times daily.    . cholecalciferol (VITAMIN D) 1000 UNITS tablet Take 1,000 Units by mouth daily.    [name] escitalopram (LEXAPRO) 20 MG tablet Take 20 mg by mouth daily.    . Multiple Vitamin (MULTIVITAMIN WITH MINERALS) TABS Take 1 tablet by mouth daily.    . pantoprazole (PROTONIX) 40 MG tablet Take 1 tablet (40 mg total) by mouth 2 (two) times daily. 60 tablet 2  . tamsulosin (FLOMAX) 0.4 MG CAPS capsule Take 1 capsule (0.4 mg total) by mouth daily after breakfast. 30 capsule 0   No current facility-administered medications for this visit.    Allergies as of 02/21/2016 - Review Complete 02/21/2016  Allergen Reaction Noted  . Nuedexta [dextromethorphan-quinidine] Anaphylaxis 10/30/2015  . Nsaids Other (See Comments) 11/02/2015    Family History  Problem Relation Age of Onset  . Heart disease Father     MVR  . Hypertension Mother     Social History   Social History  . Marital Status: Married    Spouse Name: N/A  . Number of Children: 1  . Years of Education: N/A   Occupational History  . Disabled    Social History Main Topics  . Smoking status: Former Games developer  . Smokeless tobacco: Never Used  . Alcohol Use: No  . Drug Use: No  . Sexual Activity: Yes   Other Topics Concern  . Not  on file   Social History Narrative   Lives with his mother.  Normally ambulates without assistance.       Physical Exam: BP 110/74 mmHg  Pulse 90  Ht  (1.778 m)  Wt 178 lb (80.74 kg)  BMI 25.54 kg/m2 Constitutional: generally well-appearing Psychiatric: alert and oriented x3 Abdomen: soft, nontender, nondistended, no obvious ascites, no peritoneal signs, normal bowel sounds   Assessment and plan: 50 y.o. male with large duodenal ulcer 2016  He had massive bleeding from this ulcer, 9 units of packed red blood cells. We don't really have a  good explanation as to why he has the ulcer. He and his mother deny NSAID or aspirin use, he was H. pylori negative by biopsy and serologies. Given that we don't have a clear etiology would like to repeat examination with an upper endoscopy to evaluate the duodenum again. He will also get a repeat CBC today to make sure his blood counts,  he will continue on twice daily proton pump inhibitor for now.   Rob Bunting, MD [facility] 02/21/2016, 9:19 AM

## 2016-02-26 DIAGNOSIS — N401 Enlarged prostate with lower urinary tract symptoms: Secondary | ICD-10-CM | POA: Diagnosis not present

## 2016-02-26 DIAGNOSIS — R338 Other retention of urine: Secondary | ICD-10-CM | POA: Diagnosis not present

## 2016-02-26 DIAGNOSIS — Z0000 Encounter for general adult medical examination without abnormal findings: Secondary | ICD-10-CM | POA: Diagnosis not present

## 2016-03-26 ENCOUNTER — Encounter: Payer: Medicare Other | Admitting: Gastroenterology

## 2016-04-01 ENCOUNTER — Other Ambulatory Visit: Payer: Self-pay | Admitting: Gastroenterology

## 2016-04-12 ENCOUNTER — Encounter: Payer: Self-pay | Admitting: Gastroenterology

## 2016-04-12 ENCOUNTER — Ambulatory Visit (AMBULATORY_SURGERY_CENTER): Payer: Medicare Other | Admitting: Gastroenterology

## 2016-04-12 VITALS — BP 111/74 | HR 71 | Temp 98.6°F | Resp 13 | Ht 70.0 in | Wt 178.0 lb

## 2016-04-12 DIAGNOSIS — F329 Major depressive disorder, single episode, unspecified: Secondary | ICD-10-CM | POA: Diagnosis not present

## 2016-04-12 DIAGNOSIS — K264 Chronic or unspecified duodenal ulcer with hemorrhage: Secondary | ICD-10-CM | POA: Diagnosis not present

## 2016-04-12 DIAGNOSIS — K219 Gastro-esophageal reflux disease without esophagitis: Secondary | ICD-10-CM | POA: Diagnosis not present

## 2016-04-12 DIAGNOSIS — K269 Duodenal ulcer, unspecified as acute or chronic, without hemorrhage or perforation: Secondary | ICD-10-CM | POA: Diagnosis not present

## 2016-04-12 DIAGNOSIS — K3189 Other diseases of stomach and duodenum: Secondary | ICD-10-CM | POA: Diagnosis not present

## 2016-04-12 MED ORDER — SODIUM CHLORIDE 0.9 % IV SOLN: 500.0000 mL | INTRAVENOUS | Status: DC

## 2016-04-12 NOTE — Patient Instructions (Signed)
Discharge instructions given. Biopsies taken. Resume previous medications. YOU HAD AN ENDOSCOPIC PROCEDURE TODAY AT THE [facility]:   Refer to the procedure report that was given to you for any specific questions about what was found during the examination.  If the procedure report does not answer your questions, please call your gastroenterologist to clarify.  If you requested that your care partner not be given the details of your procedure findings, then the procedure report has been included in a sealed envelope for you to review at your convenience later.  YOU SHOULD EXPECT: Some feelings of bloating in the abdomen. Passage of more gas than usual.  Walking can help get rid of the air that was put into your GI tract during the procedure and reduce the bloating. If you had a lower endoscopy (such as a colonoscopy or flexible sigmoidoscopy) you may notice spotting of blood in your stool or on the toilet paper. If you underwent a bowel prep for your procedure, you may not have a normal bowel movement for a few days.  Please Note:  You might notice some irritation and congestion in your nose or some drainage.  This is from the oxygen used during your procedure.  There is no need for concern and it should clear up in a day or so.  SYMPTOMS TO REPORT IMMEDIATELY:   Following upper endoscopy (EGD)  Vomiting of blood or coffee ground material  New chest pain or pain under the shoulder blades  Painful or persistently difficult swallowing  New shortness of breath  Fever of 100F or higher  Black, tarry-looking stools  For urgent or emergent issues, a gastroenterologist can be reached at any hour by calling [phone].   DIET: Your first meal following the procedure should be a small meal and then it is ok to progress to your normal diet. Heavy or fried foods are harder to digest and may make you feel nauseous or bloated.  Likewise, meals heavy in dairy and vegetables can increase  bloating.  Drink plenty of fluids but you should avoid alcoholic beverages for 24 hours.  ACTIVITY:  You should plan to take it easy for the rest of today and you should NOT DRIVE or use heavy machinery until tomorrow (because of the sedation medicines used during the test).    FOLLOW UP: Our staff will call the number listed on your records the next business day following your procedure to check on you and address any questions or concerns that you may have regarding the information given to you following your procedure. If we do not reach you, we will leave a message.  However, if you are feeling well and you are not experiencing any problems, there is no need to return our call.  We will assume that you have returned to your regular daily activities without incident.  If any biopsies were taken you will be contacted by phone or by letter within the next 1-3 weeks.  Please call us at [phone] if you have not heard about the biopsies in 3 weeks.    SIGNATURES/CONFIDENTIALITY: You and/or your care partner have signed paperwork which will be entered into your electronic medical record.  These signatures attest to the fact that that the information above on your After Visit Summary has been reviewed and is understood.  Full responsibility of the confidentiality of this discharge information lies with you and/or your care-partner. 

## 2016-04-12 NOTE — Op Note (Signed)
[facility] Patient Name: Zachary Thornton Procedure Date: 04/12/2016 9:35 AM MRN: [id number] Endoscopist: Rachael Fee , MD Age: 50 Date of Birth: 1966-11-09 Gender: Male Procedure:                Upper GI endoscopy Indications:              Follow-up of peptic ulcer: Duodenal ulcer bleeding                            (recieved 9 units blood during hospitalization                            10/2015). Large, bulbar duodenal ulcer noted after                            he presented with melena. Initial EGD Dr. Christella Hartigan                            10/2015 found significant peri-ulcer edema. The                            ulcer was not bleeding at that time however 2 days                            later he started to bleed and he underwent repeat                            EGD, Dr. Adela Lank. Dr. Adela Lank found a large                            bleeding ulcer and treated it with electrocautery                            and Endo clipping. Later that night he started to                            have significant rebleeding and he underwent                            prophylactic coil embolization of the GDA with IR.                            Biopsies of the stomach show no H. pylori and H.                            pylori serologies were also negative. He had not                            been taking aspirin or NSAIDs Medicines:                Monitored Anesthesia Care Procedure:                Pre-Anesthesia Assessment:                           -  Prior to the procedure, a History and Physical                            was performed, and patient medications and                            allergies were reviewed. The patient's tolerance of                            previous anesthesia was also reviewed. The risks                            and benefits of the procedure and the sedation                            options and risks were discussed with the patient.                        All questions were answered, and informed consent                            was obtained. Prior Anticoagulants: The patient has                            taken no previous anticoagulant or antiplatelet                            agents. ASA Grade Assessment: II - A patient with                            mild systemic disease. After reviewing the risks                            and benefits, the patient was deemed in                            satisfactory condition to undergo the procedure.                           After obtaining informed consent, the endoscope was                            passed under direct vision. Throughout the                            procedure, the patient's blood pressure, pulse, and                            oxygen saturations were monitored continuously. The                            Model [id number](SN#[id number]) scope was introduced  through the mouth, and advanced to the second part                            of duodenum. The upper GI endoscopy was                            accomplished without difficulty. The patient                            tolerated the procedure well. Scope In: Scope Out: Findings:                 The previously noted duodenal bulb ulcer has                            completely healed however there was localized                            nodular mucosa at the site. Biopsies were taken                            with a cold forceps for histology.                           The exam was otherwise without abnormality. Complications:            No immediate complications. Estimated blood loss:                            None. Estimated Blood Loss:     Estimated blood loss: none. Impression:               - Nodular mucosa in the duodenal bulb at site of                            previous duodenal ulcer (which has healed) Biopsied.                           - The examination was  otherwise normal. Recommendation:           - Patient has a contact number available for                            emergencies. The signs and symptoms of potential                            delayed complications were discussed with the                            patient. Return to normal activities tomorrow.                            Written discharge instructions were provided to the                            patient.                           -  Resume previous diet.                           - Continue present medications.                           - Await pathology results. Rachael Fee, MD 04/12/2016 10:00:45 AM This report has been signed electronically.

## 2016-04-12 NOTE — Progress Notes (Signed)
Report to PACU, RN, vss, BBS= Clear.  

## 2016-04-12 NOTE — Progress Notes (Signed)
Called to room to assist during endoscopic procedure.  Patient ID and intended procedure confirmed with present staff. Received instructions for my participation in the procedure from the performing physician.  

## 2016-04-15 ENCOUNTER — Telehealth: Payer: Self-pay

## 2016-04-15 NOTE — Telephone Encounter (Signed)
Left message on answering machine. 

## 2016-04-19 ENCOUNTER — Encounter: Payer: Self-pay | Admitting: Gastroenterology

## 2016-04-19 DIAGNOSIS — R338 Other retention of urine: Secondary | ICD-10-CM | POA: Diagnosis not present

## 2016-04-19 DIAGNOSIS — Z6825 Body mass index (BMI) 25.0-25.9, adult: Secondary | ICD-10-CM | POA: Diagnosis not present

## 2016-04-19 DIAGNOSIS — R945 Abnormal results of liver function studies: Secondary | ICD-10-CM | POA: Diagnosis not present

## 2016-04-19 DIAGNOSIS — R040 Epistaxis: Secondary | ICD-10-CM | POA: Diagnosis not present

## 2016-04-19 DIAGNOSIS — T783XXA Angioneurotic edema, initial encounter: Secondary | ICD-10-CM | POA: Diagnosis not present

## 2016-04-19 DIAGNOSIS — Z1389 Encounter for screening for other disorder: Secondary | ICD-10-CM | POA: Diagnosis not present

## 2016-04-19 DIAGNOSIS — R2689 Other abnormalities of gait and mobility: Secondary | ICD-10-CM | POA: Diagnosis not present

## 2016-04-19 DIAGNOSIS — D649 Anemia, unspecified: Secondary | ICD-10-CM | POA: Diagnosis not present

## 2016-04-29 ENCOUNTER — Encounter (HOSPITAL_COMMUNITY): Payer: Self-pay

## 2016-04-29 ENCOUNTER — Emergency Department (HOSPITAL_COMMUNITY): Payer: Medicare Other

## 2016-04-29 ENCOUNTER — Emergency Department (HOSPITAL_COMMUNITY)
Admission: EM | Admit: 2016-04-29 | Discharge: 2016-04-29 | Disposition: A | Discharge status: Home / Self Care | Payer: Medicare Other | Attending: Emergency Medicine | Admitting: Emergency Medicine

## 2016-04-29 DIAGNOSIS — F0391 Unspecified dementia with behavioral disturbance: Secondary | ICD-10-CM | POA: Insufficient documentation

## 2016-04-29 DIAGNOSIS — R55 Syncope and collapse: Secondary | ICD-10-CM | POA: Diagnosis not present

## 2016-04-29 DIAGNOSIS — Z79899 Other long term (current) drug therapy: Secondary | ICD-10-CM | POA: Diagnosis not present

## 2016-04-29 DIAGNOSIS — W19XXXA Unspecified fall, initial encounter: Secondary | ICD-10-CM

## 2016-04-29 DIAGNOSIS — S0990XA Unspecified injury of head, initial encounter: Secondary | ICD-10-CM | POA: Insufficient documentation

## 2016-04-29 DIAGNOSIS — R258 Other abnormal involuntary movements: Secondary | ICD-10-CM | POA: Diagnosis not present

## 2016-04-29 DIAGNOSIS — Z8719 Personal history of other diseases of the digestive system: Secondary | ICD-10-CM | POA: Diagnosis not present

## 2016-04-29 DIAGNOSIS — Z87891 Personal history of nicotine dependence: Secondary | ICD-10-CM | POA: Diagnosis not present

## 2016-04-29 DIAGNOSIS — Y998 Other external cause status: Secondary | ICD-10-CM | POA: Diagnosis not present

## 2016-04-29 DIAGNOSIS — W01198A Fall on same level from slipping, tripping and stumbling with subsequent striking against other object, initial encounter: Secondary | ICD-10-CM | POA: Insufficient documentation

## 2016-04-29 DIAGNOSIS — Z862 Personal history of diseases of the blood and blood-forming organs and certain disorders involving the immune mechanism: Secondary | ICD-10-CM | POA: Diagnosis not present

## 2016-04-29 DIAGNOSIS — R51 Headache: Secondary | ICD-10-CM | POA: Diagnosis not present

## 2016-04-29 DIAGNOSIS — Y9289 Other specified places as the place of occurrence of the external cause: Secondary | ICD-10-CM | POA: Insufficient documentation

## 2016-04-29 DIAGNOSIS — R569 Unspecified convulsions: Secondary | ICD-10-CM | POA: Diagnosis not present

## 2016-04-29 DIAGNOSIS — R404 Transient alteration of awareness: Secondary | ICD-10-CM | POA: Diagnosis not present

## 2016-04-29 DIAGNOSIS — Y9389 Activity, other specified: Secondary | ICD-10-CM | POA: Insufficient documentation

## 2016-04-29 LAB — CBC WITH DIFFERENTIAL/PLATELET
BASOS ABS: 0 10*3/uL (ref 0.0–0.1)
BASOS PCT: 0 %
Eosinophils Absolute: 0.1 10*3/uL (ref 0.0–0.7)
Eosinophils Relative: 1 %
HEMATOCRIT: 26.5 % — AB (ref 39.0–52.0)
HEMOGLOBIN: 8.4 g/dL — AB (ref 13.0–17.0)
Lymphocytes Relative: 8 %
Lymphs Abs: 0.8 10*3/uL (ref 0.7–4.0)
MCH: 24.9 pg — ABNORMAL LOW (ref 26.0–34.0)
MCHC: 31.7 g/dL (ref 30.0–36.0)
MCV: 78.6 fL (ref 78.0–100.0)
MONO ABS: 0.6 10*3/uL (ref 0.1–1.0)
Monocytes Relative: 6 %
NEUTROS ABS: 8.4 10*3/uL — AB (ref 1.7–7.7)
NEUTROS PCT: 85 %
Platelets: 276 10*3/uL (ref 150–400)
RBC: 3.37 MIL/uL — ABNORMAL LOW (ref 4.22–5.81)
RDW: 17.5 % — AB (ref 11.5–15.5)
WBC: 10 10*3/uL (ref 4.0–10.5)

## 2016-04-29 LAB — BASIC METABOLIC PANEL
ANION GAP: 10 (ref 5–15)
BUN: 18 mg/dL (ref 6–20)
CALCIUM: 8.7 mg/dL — AB (ref 8.9–10.3)
CO2: 22 mmol/L (ref 22–32)
Chloride: 101 mmol/L (ref 101–111)
Creatinine, Ser: 0.78 mg/dL (ref 0.61–1.24)
GFR calc non Af Amer: >60 mL/min (ref >60)
Glucose, Bld: 126 mg/dL — ABNORMAL HIGH (ref 65–99)
Potassium: 4.1 mmol/L (ref 3.5–5.1)
Sodium: 133 mmol/L — ABNORMAL LOW (ref 135–145)

## 2016-04-29 MED ORDER — SODIUM CHLORIDE 0.9 % IV BOLUS (SEPSIS)
1000.0000 mL | Freq: Once | INTRAVENOUS | Status: AC
  Administered 2016-04-29: 1000 mL via INTRAVENOUS

## 2016-04-29 MED ORDER — LEVETIRACETAM 500 MG/5ML IV SOLN
1000.0000 mg | Freq: Once | INTRAVENOUS | Status: AC
  Administered 2016-04-29: 1000 mg via INTRAVENOUS
  Filled 2016-04-29: qty 10

## 2016-04-29 MED ORDER — LEVETIRACETAM 500 MG PO TABS: 500.0000 mg | ORAL_TABLET | Freq: Two times a day (BID) | ORAL | Status: DC

## 2016-04-29 NOTE — ED Notes (Signed)
Pt just up to bathroom.Unable to urinate so soon.

## 2016-04-29 NOTE — ED Provider Notes (Signed)
CSN: [id number]     Arrival date & time 04/29/16  1808 History   First MD Initiated Contact with Patient 04/29/16 1815     No chief complaint on file.    (Consider location/radiation/quality/duration/timing/severity/associated sxs/prior Treatment) Patient is a 50 y.o. male presenting with general illness. The history is provided by the patient and a parent.  Illness Location:  Fall Severity:  Moderate Onset quality:  Sudden Duration: 2 minutes. Timing:  Constant Progression:  Resolved Chronicity:  New Context:  Patient with a history of a traumatic brain injury in 2012, cared for by his mother, presenting after a fall. Unwitnessed, Did hit his head. Pt unable to recall event. patient reporting headache at this time. Not on any blood thinners. The mother reports that the patient was shaking bilateral upper extremities when she found him. Lasted approximately 2 minutes and then self resolved. The patient did defecate on himself. Minimal postictal symptoms. Associated symptoms: headaches   Associated symptoms: no abdominal pain, no chest pain, no cough, no diarrhea, no fever, no nausea, no shortness of breath and no vomiting     Past Medical History  Diagnosis Date  . TBI (traumatic brain injury) (HCC)   . Back injury   . Dementia with behavioral disturbance   . Duodenal ulcer hemorrhage   . GI bleed   . Blood loss anemia    Past Surgical History  Procedure Laterality Date  . Csf shunt    . Craniotomy      Post TBI craniotomy and plate insertion  . Esophagogastroduodenoscopy (egd) with propofol N/A 10/30/2015    Procedure: ESOPHAGOGASTRODUODENOSCOPY (EGD) WITH PROPOFOL;  Surgeon: Rachael Fee, MD;  Location: [facility];  Service: Endoscopy;  Laterality: N/A;  . Esophagogastroduodenoscopy N/A 11/01/2015    Procedure: ESOPHAGOGASTRODUODENOSCOPY (EGD);  Surgeon: Ruffin Frederick, MD;  Location: [facility];  Service: Gastroenterology;  Laterality: N/A;   Family History   Problem Relation Age of Onset  . Heart disease Father     MVR  . Hypertension Mother    Social History  Substance Use Topics  . Smoking status: Former Games developer  . Smokeless tobacco: Never Used  . Alcohol Use: No    Review of Systems  Constitutional: Negative for fever and chills.  Respiratory: Negative for cough and shortness of breath.   Cardiovascular: Negative for chest pain.  Gastrointestinal: Negative for nausea, vomiting, abdominal pain and diarrhea.  Musculoskeletal: Negative for back pain, neck pain and neck stiffness.  Skin: Negative for wound.  Neurological: Positive for syncope and headaches. Negative for facial asymmetry, speech difficulty, weakness, light-headedness and numbness.       +abnormal movements  All other systems reviewed and are negative.     Allergies  Nuedexta and Nsaids  Home Medications   Prior to Admission medications   Medication Sig Start Date End Date Taking? Authorizing Provider  carbamazepine (TEGRETOL) 200 MG tablet Take 600 mg by mouth 2 (two) times daily.    Historical Provider, MD  cholecalciferol (VITAMIN D) 1000 UNITS tablet Take 1,000 Units by mouth daily.    Historical Provider, MD  escitalopram (LEXAPRO) 20 MG tablet Take 20 mg by mouth daily.    Historical Provider, MD  Multiple Vitamin (MULTIVITAMIN WITH MINERALS) TABS Take 1 tablet by mouth daily.    Historical Provider, MD  pantoprazole (PROTONIX) 40 MG tablet TAKE 1 TABLET TWICE DAILY. 04/01/16   Rachael Fee, MD  tamsulosin (FLOMAX) 0.4 MG CAPS capsule Take 1 capsule (0.4 mg total) by mouth  daily after breakfast. 11/07/15   Rhetta Mura, MD   BP 106/81 mmHg  Pulse 92  Temp(Src) 97.4 F (36.3 C) (Oral)  Resp 18  Ht 5\' 10"  (1.778 m)  Wt 79.379 kg  BMI 25.11 kg/m2  SpO2 97% Physical Exam  Constitutional: He is oriented to person, place, and time. He appears well-developed and well-nourished. No distress.  HENT:  Head: Normocephalic and atraumatic.   Mouth/Throat: Mucous membranes are not dry.  Eyes: EOM are normal. Pupils are equal, round, and reactive to light.  Cardiovascular: Normal rate, regular rhythm and intact distal pulses.   Pulmonary/Chest: Effort normal. No respiratory distress.  Abdominal: Soft. There is no tenderness.  Musculoskeletal: He exhibits no edema.  Neurological: He is alert and oriented to person, place, and time. He has normal strength and normal reflexes. No cranial nerve deficit or sensory deficit. He displays a negative Romberg sign. He displays no seizure activity. Coordination normal. GCS eye subscore is 4. GCS verbal subscore is 5. GCS motor subscore is 6.  Skin: Skin is warm and dry.    ED Course  Procedures (including critical care time) Labs Review Labs Reviewed  CBC WITH DIFFERENTIAL/PLATELET - Abnormal; Notable for the following:    RBC 3.37 (*)    Hemoglobin 8.4 (*)    HCT 26.5 (*)    MCH 24.9 (*)    RDW 17.5 (*)    Neutro Abs 8.4 (*)    All other components within normal limits  BASIC METABOLIC PANEL - Abnormal; Notable for the following:    Sodium 133 (*)    Glucose, Bld 126 (*)    Calcium 8.7 (*)    All other components within normal limits  URINE RAPID DRUG SCREEN, HOSP PERFORMED    Imaging Review No results found. I have personally reviewed and evaluated these images and lab results as part of my medical decision-making.   EKG Interpretation   Date/Time:  Monday Apr 29 2016 18:08:20 EDT Ventricular Rate:  99 PR Interval:  204 QRS Duration: 95 QT Interval:  333 QTC Calculation: 427 R Axis:   -39 Text Interpretation:  Sinus or ectopic atrial rhythm Left axis deviation  Abnormal R-wave progression, early transition No acute changes No old  tracing to compare Confirmed by Rhunette Croft, MD, Janey Genta [phone]) on 04/29/2016  7:32:30 PM      MDM   Final diagnoses:  Other abnormal involuntary movements  Fall, initial encounter    Patient with a history of a TBI presenting after an  unwitnessed fall. Abnormal movements witnessed by the mother. Unclear if this was seizure activity. Patient did defecate on himself during this episode. We'll need to treat this like it was seizure activity. Giving a Keppra load here.  Vital signs normal and stable. Mother reports that the patient is at his baseline mental status. No open wounds. No focal neurological deficits. Doubt CVA. Patient is otherwise well-appearing and in no acute distress. No meningeal signs. Doubt meningitis. Doubt encephalitis.  No evidence of leukocytosis. Patient is anemic here; this is consistent with his baseline. Electrolytes within normal limits. No AKI. EKG shows no interval abnormalities or ischemic changes. Denies any chest pain or shortness of breath. Doubt ACS. Getting a CAT scan of the patient's head.  CT showed no evidence of acute intracranial process. Doubt acute bleed. Redemonstrated his history of his traumatic brain injury status post surgical intervention. Demonstrated his VP shunt. No evidence of fracture. No evidence of hydrocephalus. After discussion with the patient he denies  any headaches, vision changes, gait instability, or nausea or vomiting. Doubt fracture of his VP shunt that could be causing him to have symptoms at this time.  We'll give him a prescription for Keppra. We'll have him follow-up with neurology on the outpatient basis.  Strict return precautions provided. Patient discharged in stable condition.  Lindalou HoseSean O'Rourke, MD 04/29/16 [id number]  Derwood KaplanAnkit Nanavati, MD 04/30/16 1530

## 2016-04-29 NOTE — ED Notes (Signed)
Pt arrives EMS with c/o shaking activities witnessed by mother. Nose bleed at time. Hx of TBI with skull fracture about 5 years ago but no previous seizure.

## 2016-04-29 NOTE — ED Notes (Signed)
Pt back in room.

## 2016-04-29 NOTE — ED Notes (Signed)
Replaced pt's linens, due to feces on pt's bedding.

## 2016-04-30 ENCOUNTER — Other Ambulatory Visit: Payer: Self-pay | Admitting: Gastroenterology

## 2016-05-06 DIAGNOSIS — J342 Deviated nasal septum: Secondary | ICD-10-CM | POA: Diagnosis not present

## 2016-05-06 DIAGNOSIS — J310 Chronic rhinitis: Secondary | ICD-10-CM | POA: Diagnosis not present

## 2016-05-06 DIAGNOSIS — R040 Epistaxis: Secondary | ICD-10-CM | POA: Diagnosis not present

## 2016-05-07 ENCOUNTER — Ambulatory Visit: Payer: Medicare Other | Admitting: Physical Therapy

## 2016-05-08 ENCOUNTER — Other Ambulatory Visit (HOSPITAL_COMMUNITY): Payer: Self-pay | Admitting: *Deleted

## 2016-05-09 ENCOUNTER — Ambulatory Visit (HOSPITAL_COMMUNITY)
Admission: RE | Admit: 2016-05-09 | Discharge: 2016-05-09 | Disposition: A | Discharge status: Home / Self Care | Payer: Medicare Other | Source: Ambulatory Visit | Attending: Internal Medicine | Admitting: Internal Medicine

## 2016-05-09 DIAGNOSIS — D649 Anemia, unspecified: Secondary | ICD-10-CM | POA: Insufficient documentation

## 2016-05-09 MED ORDER — SODIUM CHLORIDE 0.9 % IV SOLN
510.0000 mg | INTRAVENOUS | Status: DC
Start: 2016-05-09 — End: 2016-05-10
  Administered 2016-05-09: 510 mg via INTRAVENOUS
  Filled 2016-05-09: qty 17

## 2016-05-09 NOTE — Discharge Instructions (Signed)

## 2016-05-15 ENCOUNTER — Other Ambulatory Visit (HOSPITAL_COMMUNITY): Payer: Self-pay | Admitting: *Deleted

## 2016-05-16 ENCOUNTER — Encounter (HOSPITAL_COMMUNITY)
Admission: RE | Admit: 2016-05-16 | Discharge: 2016-05-16 | Disposition: A | Discharge status: Home / Self Care | Payer: Medicare Other | Source: Ambulatory Visit | Attending: Internal Medicine | Admitting: Internal Medicine

## 2016-05-16 DIAGNOSIS — D649 Anemia, unspecified: Secondary | ICD-10-CM | POA: Insufficient documentation

## 2016-05-16 MED ORDER — FERUMOXYTOL INJECTION 510 MG/17 ML
510.0000 mg | INTRAVENOUS | Status: AC
Start: 2016-05-16 — End: 2016-05-16
  Administered 2016-05-16: 510 mg via INTRAVENOUS
  Filled 2016-05-16: qty 17

## 2016-05-17 ENCOUNTER — Ambulatory Visit: Payer: Medicare Other | Admitting: Physical Therapy

## 2016-05-23 ENCOUNTER — Ambulatory Visit: Payer: Medicare Other | Attending: Internal Medicine | Admitting: Physical Therapy

## 2016-05-23 DIAGNOSIS — R2681 Unsteadiness on feet: Secondary | ICD-10-CM | POA: Diagnosis not present

## 2016-05-23 DIAGNOSIS — R2689 Other abnormalities of gait and mobility: Secondary | ICD-10-CM | POA: Diagnosis not present

## 2016-05-24 NOTE — Therapy (Signed)
[facility] [address] [place], [place], [address] Phone: [phone]   Fax:  [phone]  Physical Therapy Evaluation  Patient Details  Name: Zachary Thornton MRN: [id number] Date of Birth: [birth year] Referring Provider: Waynard Edwards  Encounter Date: 05/23/2016      PT End of Session - 05/24/16 1236    Visit Number 1   Number of Visits 17   Date for PT Re-Evaluation 07/23/16   Authorization Type Medicare-Gcode every 10th visit   PT Start Time 1019   PT Stop Time 1102   PT Time Calculation (min) 43 min   Equipment Utilized During Treatment Gait belt   Activity Tolerance Patient tolerated treatment well   Behavior During Therapy [facility] for tasks assessed/performed      Past Medical History  Diagnosis Date  . TBI (traumatic brain injury) (HCC)   . Back injury   . Dementia with behavioral disturbance   . Duodenal ulcer hemorrhage   . GI bleed   . Blood loss anemia     Past Surgical History  Procedure Laterality Date  . Csf shunt    . Craniotomy      Post TBI craniotomy and plate insertion  . Esophagogastroduodenoscopy (egd) with propofol N/A 10/30/2015    Procedure: ESOPHAGOGASTRODUODENOSCOPY (EGD) WITH PROPOFOL;  Surgeon: Rachael Fee, MD;  Location: [facility];  Service: Endoscopy;  Laterality: N/A;  . Esophagogastroduodenoscopy N/A 11/01/2015    Procedure: ESOPHAGOGASTRODUODENOSCOPY (EGD);  Surgeon: Ruffin Frederick, MD;  Location: [facility];  Service: Gastroenterology;  Laterality: N/A;    There were no vitals filed for this visit.       Subjective Assessment - 05/23/16 1025    Subjective Pt is 50 year old male who presents to OP PT with mom present at eval.  History of fall and TBI 5 years ago with rehab following.  Recent history of bleeding ulcers with some falling in the past 6-9 months.  R side affected with decreased balance.  Mom reports at least 5-6 falls in the past 6 months; does not use  assistive device.   Patient is accompained by: Family member  Mom-Kaye Sconyers   Patient Stated Goals [name] goals are to improve balance and prevent future falls.   Currently in Pain? No/denies            [facility] PT Assessment - 05/23/16 1029    Assessment   Medical Diagnosis gait, balance   Referring Provider Perini   Onset Date/Surgical Date --  incr. falls past 6-9 months   Precautions   Precautions Fall  TBI 5 years ago   Balance Screen   Has the patient fallen in the past 6 months Yes   How many times? 5-6   Has the patient had a decrease in activity level because of a fear of falling?  Yes   Is the patient reluctant to leave their home because of a fear of falling?  No   Home Nurse, mental health Private residence   Living Arrangements Parent   Available Help at Discharge Family   Type of Home House   Home Access Stairs to enter   Entrance Stairs-Number of Steps 3   Entrance Stairs-Rails Right   Home Layout Two level;Able to live on main level with bedroom/bathroom  Goes upstairs occasionally   Prior Function   Level of Independence Independent with basic ADLs;Independent with household mobility without device  Supervision for community mobility   Leisure Enjoys going out to  eat, shopping, grocery store-with family member assistance   Cognition   Overall Cognitive Status History of cognitive impairments - at baseline  Due to TBI 5 years ago   ROM / Strength   AROM / PROM / Strength Strength   Strength   Overall Strength Comments Grossly tested bilateral lower extremities at least 4/5    Transfers   Transfers Sit to Stand;Stand to Sit   Sit to Stand 5: Supervision;Without upper extremity assist;From chair/3-in-1   Five time sit to stand comments  20.22   Stand to Sit 5: Supervision;Without upper extremity assist;To chair/3-in-1   Comments Mom reports typically needing UE support for sit<>stand   Ambulation/Gait   Ambulation/Gait Yes   Ambulation/Gait  Assistance 5: Supervision   Ambulation/Gait Assistance Details Pt hikes shoulders with gait, with decr. trunk rotation, decreased arm swing, ataxic, wide BOS gait.     Ambulation Distance (Feet) 150 Feet   Assistive device None   Gait Pattern Step-through pattern;Decreased arm swing - right;Decreased step length - left;Decreased step length - right;Decreased hip/knee flexion - right;Right circumduction;Ataxic;Trunk rotated posteriorly on right;Decreased trunk rotation;Wide base of support;Abducted- right;Decreased weight shift to right   Ambulation Surface Level;Indoor   Gait velocity 12.54 sec = 2.62 ft/sec   Standardized Balance Assessment   Standardized Balance Assessment Timed Up and Go Test;Berg Balance Test;Dynamic Gait Index   Berg Balance Test   Sit to Stand Able to stand  independently using hands   Standing Unsupported Able to stand safely 2 minutes   Sitting with Back Unsupported but Feet Supported on Floor or Stool Able to sit safely and securely 2 minutes   Stand to Sit Controls descent by using hands   Transfers Able to transfer safely, definite need of hands   Standing Unsupported with Eyes Closed Able to stand 10 seconds with supervision   Standing Ubsupported with Feet Together Needs help to attain position but able to stand for 30 seconds with feet together   From Standing, Reach Forward with Outstretched Arm Can reach forward >12 cm safely (5")   From Standing Position, Pick up Object from Floor Able to pick up shoe, needs supervision   From Standing Position, Turn to Look Behind Over each Shoulder Turn sideways only but maintains balance   Turn 360 Degrees Able to turn 360 degrees safely but slowly   Standing Unsupported, Alternately Place Feet on Step/Stool Able to complete >2 steps/needs minimal assist   Standing Unsupported, One Foot in Front Able to take small step independently and hold 30 seconds   Standing on One Leg Unable to try or needs assist to prevent fall    Total Score 34   Berg comment: Scores <45/56 indicate increased fall risk.   Dynamic Gait Index   Level Surface Moderate Impairment   Change in Gait Speed Moderate Impairment   Gait with Horizontal Head Turns Mild Impairment   Gait with Vertical Head Turns Mild Impairment   Gait and Pivot Turn Mild Impairment   Step Over Obstacle Severe Impairment   Step Around Obstacles Moderate Impairment   Steps Moderate Impairment   Total Score 10   DGI comment: Scores <19/24 indicate increased fall risk.   Timed Up and Go Test   Normal TUG (seconds) 15.6   TUG Comments Scores >13.5 seconds indicate increased fall risk.                             PT Short Term  Goals - 05/24/16 1241    PT SHORT TERM GOAL #1   Title Pt will perform HEP with family supervision, for improved balance and mobility.  TARGET 06/22/16   Time 4   Period Weeks   Status New   PT SHORT TERM GOAL #2   Title Pt will improve 5x sit<>stand to less than or equal to 17 seconds for improved transfer efficiency and safety.   Time 4   Period Weeks   Status New   PT SHORT TERM GOAL #3   Title Pt will improve TUG score to less than or equal to 13.5 seconds for decreased fall risk.   Time 4   Period Weeks   Status New   PT SHORT TERM GOAL #4   Title Pt will imrpove Berg Balance score to at least 40/56 for decreased fall risk.   Time 4   Period Weeks           PT Long Term Goals - 05/24/16 1243    PT LONG TERM GOAL #1   Title Pt/family will verbalize understanding of fall prevention within home environment.  TARGET 07/23/16   Time 8   Period Weeks   Status New   PT LONG TERM GOAL #2   Title Pt will improve Berg Balance score to at least 45/56 for decreased fall risk.   Time 8   Period Weeks   Status New   PT LONG TERM GOAL #3   Title Pt will improve Dynamic Gait Index score to at least 17/24 for decreased fall risk.   Time 8   Period Weeks   Status New   PT LONG TERM GOAL #4   Title Pt will  verbalize plans for ongoing community fitness upon D/C from PT.   Time 8   Period Weeks   Status New   PT LONG TERM GOAL #5   Title Pt will ambulate at least 1000 ft indoor and outdoor surfaces without loss of balance, for improved community and outdoor mobility.   Time 8   Period Weeks   Status New               Plan - 05/24/16 1237    Clinical Impression Statement Pt is a 50 year old male who presentst OP PT with recent history of increased falls in the past 6-9 months.  Mom is present at eval to help with history, as pt has history of TBI/cognitive impairments 5 years ago.  Mom reports 5-6 falls in the past 6-9 months.  Pt presents to OP PT with decreased balance, decreased gait independence and safety, decrease dynamic balance and mobility, recent falls.  Pt would benefit from skilled PT to address the above stated deficits to improve functional mobility and decrease fall risk.   Rehab Potential Good   PT Frequency 2x / week   PT Duration 8 weeks  plus eval   PT Treatment/Interventions ADLs/Self Care Home Management;Therapeutic exercise;Therapeutic activities;Functional mobility training;Gait training;DME Instruction;Balance training;Neuromuscular re-education;Patient/family education   PT Next Visit Plan Check balance on soild/foam surfaces with EO/EC; initiate HEP for balance   Consulted and Agree with Plan of Care Patient;Family member/caregiver   Family Member Consulted Mom-Kaye      Patient will benefit from skilled therapeutic intervention in order to improve the following deficits and impairments:  Abnormal gait, Decreased balance, Decreased mobility, Decreased coordination, Decreased safety awareness, Difficulty walking, Postural dysfunction  Visit Diagnosis: Other abnormalities of gait and mobility  Unsteadiness on feet  G-Codes - 05/23/16 1245    Functional Assessment Tool Used DGI 10/24, Berg 34/56, TUG 15.6 sec, 5x sit<>stand 20.22 sec, 5-6 falls in past  6-9 months.   Functional Limitation Mobility: Walking and moving around   Mobility: Walking and Moving Around Current Status [phone]) At least 40 percent but less than 60 percent impaired, limited or restricted   Mobility: Walking and Moving Around Goal Status [phone]) At least 1 percent but less than 20 percent impaired, limited or restricted       Problem List Patient Active Problem List   Diagnosis Date Noted  . Bleeding duodenal ulcer   . Hypokalemia   . Lactic acidosis   . Duodenal ulcer hemorrhage 10/31/2015  . Urinary retention 10/30/2015  . Acute blood loss anemia 10/29/2015  . Hypotension 10/29/2015  . Syncope and collapse 10/29/2015  . Traumatic brain injury with depressed frontal skull fracture (HCC) 09/07/2012  . Cognitive deficit as late effect of traumatic brain injury (HCC) 09/07/2012  . Episodic dyscontrol syndrome 09/07/2012    Malvina Schadler W. 05/24/2016, 12:46 PM Gean Maidens., PT [place] [facility] [address] [place], [place], [address] Phone: [phone]   Fax:  [phone]  Name: Zachary Thornton MRN: [id number] Date of Birth: 11-09-1966

## 2016-05-27 ENCOUNTER — Encounter: Payer: Self-pay | Admitting: Rehabilitation

## 2016-05-27 ENCOUNTER — Ambulatory Visit: Payer: Medicare Other | Admitting: Rehabilitation

## 2016-05-27 DIAGNOSIS — R2681 Unsteadiness on feet: Secondary | ICD-10-CM | POA: Diagnosis not present

## 2016-05-27 DIAGNOSIS — R2689 Other abnormalities of gait and mobility: Secondary | ICD-10-CM | POA: Diagnosis not present

## 2016-05-27 DIAGNOSIS — D6489 Other specified anemias: Secondary | ICD-10-CM | POA: Diagnosis not present

## 2016-05-27 NOTE — Patient Instructions (Signed)
All balance exercises, stand in corner with chair in front of you in case you lose your balance. Have mom be with you for the ones where your eyes are closed.    SINGLE LIMB STANCE    Stance: single leg on floor. Raise leg. Hold _15__ seconds. Repeat with other leg.  Start by using one hand to hold onto chair.  The better you get, work on lightening how much you do with that hand.  For example use whole hand, move to 4 fingers, 3 fingers, and so on.   _3__ reps per set on each side, _2__ sets per day, _5-7__ days per week  Copyright  VHI. All rights reserved.   Feet Together, Arm Motion - Eyes Open    With eyes open, feet together.  Keep your arms by your side.   Repeat _3___ times per session and hold for 30 secs each. Do __2__ sessions per day.  Copyright  VHI. All rights reserved.  Feet Apart (Compliant Surface) Arm Motion - Eyes Open    With eyes open, standing on compliant surface: __stacked pillows or couch cushion (make sure not too thin or thick)______, feet shoulder width apart.  Keep arms by your side.  Repeat _3___ times per session for 30 seconds. Do __2__ sessions per day.  Copyright  VHI. All rights reserved.   Feet Together (Compliant Surface) Arm Motion - Eyes Open    With eyes open, standing on compliant surface:__stacked pillows or cushion as above______, feet together.  Keep arms by your side.  Repeat _3___ times per session for 30 secs each. Do __2__ sessions per day.  Copyright  VHI. All rights reserved.   Feet Apart (Compliant Surface) Arm Motion - Eyes Closed    Stand on compliant surface: __stacked pillows or cushion______, feet shoulder width apart. Close eyes and keep arms by your side.  Repeat _3___ times per session for 30 secs each. Do __2__ sessions per day.  Copyright  VHI. All rights reserved.

## 2016-05-27 NOTE — Therapy (Signed)
[facility] [address] [place], [place], [address] Phone: [phone]   Fax:  [phone]  Physical Therapy Treatment  Patient Details  Name: Zachary Thornton MRN: [id number] Date of Birth: 07-05-1966 Referring Provider: Waynard Edwards  Encounter Date: 05/27/2016      PT End of Session - 05/27/16 0935    Visit Number 2   Number of Visits 17   Date for PT Re-Evaluation 07/23/16   Authorization Type Medicare-Gcode every 10th visit   PT Start Time 0931   PT Stop Time 1015   PT Time Calculation (min) 44 min   Equipment Utilized During Treatment Gait belt   Activity Tolerance Patient tolerated treatment well   Behavior During Therapy [facility] for tasks assessed/performed      Past Medical History  Diagnosis Date  . TBI (traumatic brain injury) (HCC)   . Back injury   . Dementia with behavioral disturbance   . Duodenal ulcer hemorrhage   . GI bleed   . Blood loss anemia     Past Surgical History  Procedure Laterality Date  . Csf shunt    . Craniotomy      Post TBI craniotomy and plate insertion  . Esophagogastroduodenoscopy (egd) with propofol N/A 10/30/2015    Procedure: ESOPHAGOGASTRODUODENOSCOPY (EGD) WITH PROPOFOL;  Surgeon: Rachael Fee, MD;  Location: [facility];  Service: Endoscopy;  Laterality: N/A;  . Esophagogastroduodenoscopy N/A 11/01/2015    Procedure: ESOPHAGOGASTRODUODENOSCOPY (EGD);  Surgeon: Ruffin Frederick, MD;  Location: [facility];  Service: Gastroenterology;  Laterality: N/A;    There were no vitals filed for this visit.      Subjective Assessment - 05/27/16 0934    Subjective No changes since last visit, no falls, no reported pain.    Patient is accompained by: Family member   Currently in Pain? No/denies        NMR:  Provided pt with corner balance exercises to establish HEP, see pt instruction for details on exercises and reps performed.  // bar activity- Worked on ankle and hip  strategy with rocker board facing vertically and horizontally x 2 reps of 1 min without UE support in each direction.  Note great LOB posterior with max verbal cues on how to correct via hips rather than shoulders/trunk.  Due to noted decreased hip strategy, moved to wall bumps with feet approx 4" away x 10 reps, moving to approx 6" away x 10 reps and then on foam balance beam for increased challenge x 10 reps.                           PT Education - 05/27/16 1106    Education provided Yes   Education Details see HEP, cues to have mother present during eyes closed exercise   Person(s) Educated Patient;Parent(s)   Methods Explanation;Demonstration;Verbal cues;Handout   Comprehension Verbalized understanding;Returned demonstration;Need further instruction          PT Short Term Goals - 05/24/16 1241    PT SHORT TERM GOAL #1   Title Pt will perform HEP with family supervision, for improved balance and mobility.  TARGET 06/22/16   Time 4   Period Weeks   Status New   PT SHORT TERM GOAL #2   Title Pt will improve 5x sit<>stand to less than or equal to 17 seconds for improved transfer efficiency and safety.   Time 4   Period Weeks   Status New   PT SHORT  TERM GOAL #3   Title Pt will improve TUG score to less than or equal to 13.5 seconds for decreased fall risk.   Time 4   Period Weeks   Status New   PT SHORT TERM GOAL #4   Title Pt will imrpove Berg Balance score to at least 40/56 for decreased fall risk.   Time 4   Period Weeks           PT Long Term Goals - 05/24/16 1243    PT LONG TERM GOAL #1   Title Pt/family will verbalize understanding of fall prevention within home environment.  TARGET 07/23/16   Time 8   Period Weeks   Status New   PT LONG TERM GOAL #2   Title Pt will improve Berg Balance score to at least 45/56 for decreased fall risk.   Time 8   Period Weeks   Status New   PT LONG TERM GOAL #3   Title Pt will improve Dynamic Gait Index score  to at least 17/24 for decreased fall risk.   Time 8   Period Weeks   Status New   PT LONG TERM GOAL #4   Title Pt will verbalize plans for ongoing community fitness upon D/C from PT.   Time 8   Period Weeks   Status New   PT LONG TERM GOAL #5   Title Pt will ambulate at least 1000 ft indoor and outdoor surfaces without loss of balance, for improved community and outdoor mobility.   Time 8   Period Weeks   Status New               Plan - 05/27/16 0935    Clinical Impression Statement Skilled session addressed establishment of HEP for corner balance tasks.  Pt tolerated well.  Note good elicitation of ankle strategy, however very poor hip and stepping strategy noted during session. Will continue to address.    Rehab Potential Good   PT Frequency 2x / week   PT Duration 8 weeks  plus eval   PT Treatment/Interventions ADLs/Self Care Home Management;Therapeutic exercise;Therapeutic activities;Functional mobility training;Gait training;DME Instruction;Balance training;Neuromuscular re-education;Patient/family education   PT Next Visit Plan check compliance with HEP (go over if needed), work on hip and stepping strategy, braiding, compliant surfaces, narrow BOS   PT Home Exercise Plan See pt instruction 05/27/16   Consulted and Agree with Plan of Care Patient;Family member/caregiver   Family Member Consulted Mom-Kaye      Patient will benefit from skilled therapeutic intervention in order to improve the following deficits and impairments:  Abnormal gait, Decreased balance, Decreased mobility, Decreased coordination, Decreased safety awareness, Difficulty walking, Postural dysfunction  Visit Diagnosis: Other abnormalities of gait and mobility  Unsteadiness on feet     Problem List Patient Active Problem List   Diagnosis Date Noted  . Bleeding duodenal ulcer   . Hypokalemia   . Lactic acidosis   . Duodenal ulcer hemorrhage 10/31/2015  . Urinary retention 10/30/2015  .  Acute blood loss anemia 10/29/2015  . Hypotension 10/29/2015  . Syncope and collapse 10/29/2015  . Traumatic brain injury with depressed frontal skull fracture (HCC) 09/07/2012  . Cognitive deficit as late effect of traumatic brain injury (HCC) 09/07/2012  . Episodic dyscontrol syndrome 09/07/2012    Harriet ButteEmily Ndea Kilroy, PT, MPT [facility] [address] [place], [place], [address] Phone: [phone]   Fax:  [phone] 05/27/2016, 12:39 PM  Name: Zachary LoosenScott Thornton MRN: [id number] Date of Birth:  11/30/1966    

## 2016-05-31 ENCOUNTER — Encounter: Payer: Self-pay | Admitting: Rehabilitation

## 2016-05-31 ENCOUNTER — Ambulatory Visit: Payer: Medicare Other | Admitting: Rehabilitation

## 2016-05-31 DIAGNOSIS — R2681 Unsteadiness on feet: Secondary | ICD-10-CM

## 2016-05-31 DIAGNOSIS — R2689 Other abnormalities of gait and mobility: Secondary | ICD-10-CM | POA: Diagnosis not present

## 2016-05-31 NOTE — Therapy (Signed)
[facility] [address] [place], [place], [address] Phone: [phone]   Fax:  [phone]  Physical Therapy Treatment  Patient Details  Name: Zachary LoosenScott Thornton MRN: [id number] Date of Birth: 07/09/1966 Referring Provider: Waynard EdwardsPerini  Encounter Date: 05/31/2016      PT End of Session - 05/31/16 1020    Visit Number 3   Number of Visits 17   Date for PT Re-Evaluation 07/23/16   Authorization Type Medicare-Gcode every 10th visit   PT Start Time 1017   PT Stop Time 1101   PT Time Calculation (min) 44 min   Equipment Utilized During Treatment Gait belt   Activity Tolerance Patient tolerated treatment well   Behavior [facility] for tasks assessed/performed      Past Medical History  Diagnosis Date  . TBI (traumatic brain injury) (HCC)   . Back injury   . Dementia with behavioral disturbance   . Duodenal ulcer hemorrhage   . GI bleed   . Blood loss anemia     Past Surgical History  Procedure Laterality Date  . Csf shunt    . Craniotomy      Post TBI craniotomy and plate insertion  . Esophagogastroduodenoscopy (egd) with propofol N/A 10/30/2015    Procedure: ESOPHAGOGASTRODUODENOSCOPY (EGD) WITH PROPOFOL;  Surgeon: Rachael Feeaniel P Jacobs, MD;  Location: [facility];  Service: Endoscopy;  Laterality: N/A;  . Esophagogastroduodenoscopy N/A 11/01/2015    Procedure: ESOPHAGOGASTRODUODENOSCOPY (EGD);  Surgeon: Ruffin FrederickSteven Paul Armbruster, MD;  [facility];  Service: Gastroenterology;  Laterality: N/A;    There were no vitals filed for this visit.      Subjective Assessment - 05/31/16 1019    Subjective No changes since last visit, had birthday dinner yesterday.    Patient Stated Goals [name] goals are to improve balance and prevent future falls.   Currently in Pain? No/denies          NMR:  Worked on SLS while performing cone taps alternating LEs and side stepping to next target.  Progressed difficulty by  increasing time spent in SLS by tipping cone over with single LE and bringing back to upright. Continued with high level balance and core strengthening/activation with quadruped alternating UE/LE with min facilitation to prevent trunk rotation and cues for increased knee and hip extension.  Progressed to working in tall kneeling for equal WB and quad/glute strength performing squats without UE x 10 reps>resisted squats x 10 reps.  Tolerated well with no LOB during activity.  Performed marching forwards and backwards while on red gym mat x 2 reps.  Requires min A for forward marching with cues for slower speed and increased time spent on RLE and more min to mod A for backwards due to tendency to lose balance posteriorly with cues for slight forward trunk lean.  Continued with balance tasks to address hip strategy while standing on ramp on foam mat feet apart x 1 min>feet apart and head turns side to side x 10 reps and vertically x 10 reps progressing towards feet in semi tandem with horizontal head turns x 10 reps and vertical head turns x 10 reps.  Provided min to mod A to maintain balance initially with cues for posture and how to self correct with hips and trunk movement.  Ended session with rocker board tasks in // bars in vertical and horizontal orientation maintaining balance without UE support x 2 reps of 1 min each.  Note improvement in hip strategy when facing in vertical  movement, however continues to require heavy cues and up to mod A when in horizontal orientation.                         PT Education - 05/31/16 1349    Education provided Yes   Education Details compliance with HEP   Person(s) Educated Patient;Parent(s)   Methods Explanation   Comprehension Verbalized understanding          PT Short Term Goals - 05/24/16 1241    PT SHORT TERM GOAL #1   Title Pt will perform HEP with family supervision, for improved balance and mobility.  TARGET 06/22/16   Time 4   Period  Weeks   Status New   PT SHORT TERM GOAL #2   Title Pt will improve 5x sit<>stand to less than or equal to 17 seconds for improved transfer efficiency and safety.   Time 4   Period Weeks   Status New   PT SHORT TERM GOAL #3   Title Pt will improve TUG score to less than or equal to 13.5 seconds for decreased fall risk.   Time 4   Period Weeks   Status New   PT SHORT TERM GOAL #4   Title Pt will imrpove Berg Balance score to at least 40/56 for decreased fall risk.   Time 4   Period Weeks           PT Long Term Goals - 05/24/16 1243    PT LONG TERM GOAL #1   Title Pt/family will verbalize understanding of fall prevention within home environment.  TARGET 07/23/16   Time 8   Period Weeks   Status New   PT LONG TERM GOAL #2   Title Pt will improve Berg Balance score to at least 45/56 for decreased fall risk.   Time 8   Period Weeks   Status New   PT LONG TERM GOAL #3   Title Pt will improve Dynamic Gait Index score to at least 17/24 for decreased fall risk.   Time 8   Period Weeks   Status New   PT LONG TERM GOAL #4   Title Pt will verbalize plans for ongoing community fitness upon D/C from PT.   Time 8   Period Weeks   Status New   PT LONG TERM GOAL #5   Title Pt will ambulate at least 1000 ft indoor and outdoor surfaces without loss of balance, for improved community and outdoor mobility.   Time 8   Period Weeks   Status New               Plan - 05/31/16 1020    Clinical Impression Statement Skilled session continues to focus on high level balance with SLS and ankle/hip strategy exercises.  Note improvement in ability to elicit hip strategy, however continues to require heavy cues.     Rehab Potential Good   PT Frequency 2x / week   PT Duration 8 weeks  plus eval   PT Treatment/Interventions ADLs/Self Care Home Management;Therapeutic exercise;Therapeutic activities;Functional mobility training;Gait training;DME Instruction;Balance training;Neuromuscular  re-education;Patient/family education   PT Next Visit Plan check compliance with HEP (go over if needed), work on hip and stepping strategy, braiding, compliant surfaces, narrow BOS   PT Home Exercise Plan See pt instruction 05/27/16   Consulted and Agree with Plan of Care Patient;Family member/caregiver   Family Member Consulted Mom-Kaye      Patient will benefit from skilled therapeutic  intervention in order to improve the following deficits and impairments:  Abnormal gait, Decreased balance, Decreased mobility, Decreased coordination, Decreased safety awareness, Difficulty walking, Postural dysfunction  Visit Diagnosis: Other abnormalities of gait and mobility  Unsteadiness on feet     Problem List Patient Active Problem List   Diagnosis Date Noted  . Bleeding duodenal ulcer   . Hypokalemia   . Lactic acidosis   . Duodenal ulcer hemorrhage 10/31/2015  . Urinary retention 10/30/2015  . Acute blood loss anemia 10/29/2015  . Hypotension 10/29/2015  . Syncope and collapse 10/29/2015  . Traumatic brain injury with depressed frontal skull fracture (HCC) 09/07/2012  . Cognitive deficit as late effect of traumatic brain injury (HCC) 09/07/2012  . Episodic dyscontrol syndrome 09/07/2012    Harriet Butte, PT, MPT [facility] [address] [place], [place], [address] Phone: [phone]   Fax:  [phone] 05/31/2016, 1:51 PM  Name: Kartik Fernando MRN: [id number] Date of Birth: 1966/04/01

## 2016-06-03 ENCOUNTER — Other Ambulatory Visit: Payer: Self-pay | Admitting: Gastroenterology

## 2016-06-03 ENCOUNTER — Ambulatory Visit: Payer: Medicare Other | Admitting: Rehabilitation

## 2016-06-03 ENCOUNTER — Encounter: Payer: Self-pay | Admitting: Rehabilitation

## 2016-06-03 DIAGNOSIS — R2681 Unsteadiness on feet: Secondary | ICD-10-CM | POA: Diagnosis not present

## 2016-06-03 DIAGNOSIS — R2689 Other abnormalities of gait and mobility: Secondary | ICD-10-CM | POA: Diagnosis not present

## 2016-06-03 NOTE — Therapy (Signed)
[facility] [address] [place], [place], [address] Phone: [phone]   Fax:  [phone]  Physical Therapy Treatment  Patient Details  Name: Zachary Thornton MRN: [id number] Date of Birth: [birth year] Referring Provider: Waynard Edwards  Encounter Date: 06/03/2016      PT End of Session - 06/03/16 1105    Visit Number 4   Number of Visits 17   Date for PT Re-Evaluation 07/23/16   Authorization Type Medicare-Gcode every 10th visit   PT Start Time 1100   PT Stop Time 1146   PT Time Calculation (min) 46 min   Equipment Utilized During Treatment Gait belt   Activity Tolerance Patient tolerated treatment well   Behavior During Therapy [facility] for tasks assessed/performed      Past Medical History  Diagnosis Date  . TBI (traumatic brain injury) (HCC)   . Back injury   . Dementia with behavioral disturbance   . Duodenal ulcer hemorrhage   . GI bleed   . Blood loss anemia     Past Surgical History  Procedure Laterality Date  . Csf shunt    . Craniotomy      Post TBI craniotomy and plate insertion  . Esophagogastroduodenoscopy (egd) with propofol N/A 10/30/2015    Procedure: ESOPHAGOGASTRODUODENOSCOPY (EGD) WITH PROPOFOL;  Surgeon: Rachael Fee, MD;  Location: [facility];  Service: Endoscopy;  Laterality: N/A;  . Esophagogastroduodenoscopy N/A 11/01/2015    Procedure: ESOPHAGOGASTRODUODENOSCOPY (EGD);  Surgeon: Ruffin Frederick, MD;  Location: [facility];  Service: Gastroenterology;  Laterality: N/A;    There were no vitals filed for this visit.      Subjective Assessment - 06/03/16 1104    Subjective Reports no changes since last visit, no falls.    Patient is accompained by: Family member   Patient Stated Goals [name] goals are to improve balance and prevent future falls.   Currently in Pain? No/denies                     Gait:  Addressed LTG of outdoor gait.  Pt ambulated 1000' without AD at S  to min/guard level (more min/guard over grass vs paved surface, discussed this with mother for safety at home).  Pt with no endurance deficits during gait and was able to ambulate around building without SOB.  Also ambulated without significant LOB.    NMR:  High level balance in // bars as follows; step ups while R LE planted on blue foam on 4" step while LLE tapped to chair seat (and vice versa) with single UE support only x 5 reps>no UE support x 5 reps with heavy assist and facilitation to get hip over foot when stepping up, however improved during activity.  Then performed standing on blue foam balance beam without UE support with eyes closed x 2 reps of 1 min.  Min assist for safety and min cues to self adjust as needed for occasional LOB forwards/backwards, but overall doing much better eliciting hips to correct balance.  Performed gait with ball toss x 230' with min A for occasional LOB.  Continue to note very wide BOS and L foot flat during gait, however he can correct somewhat with cues, but not able to maintain.  Mother states gait has been this way since brain injury.  Ended session with bean bag kick in order to work on single limb stance in a modified and more dynamic form.  Performed 230' in this manner at S to min/guard  level.                PT Education - 06/03/16 1104    Education provided Yes   Education Details education for mother to be with him (guarding assist) for gait over grassy surfaces   Person(s) Educated Patient;Parent(s)   Methods Explanation   Comprehension Verbalized understanding          PT Short Term Goals - 05/24/16 1241    PT SHORT TERM GOAL #1   Title Pt will perform HEP with family supervision, for improved balance and mobility.  TARGET 06/22/16   Time 4   Period Weeks   Status New   PT SHORT TERM GOAL #2   Title Pt will improve 5x sit<>stand to less than or equal to 17 seconds for improved transfer efficiency and safety.   Time 4   Period Weeks    Status New   PT SHORT TERM GOAL #3   Title Pt will improve TUG score to less than or equal to 13.5 seconds for decreased fall risk.   Time 4   Period Weeks   Status New   PT SHORT TERM GOAL #4   Title Pt will imrpove Berg Balance score to at least 40/56 for decreased fall risk.   Time 4   Period Weeks           PT Long Term Goals - 05/24/16 1243    PT LONG TERM GOAL #1   Title Pt/family will verbalize understanding of fall prevention within home environment.  TARGET 07/23/16   Time 8   Period Weeks   Status New   PT LONG TERM GOAL #2   Title Pt will improve Berg Balance score to at least 45/56 for decreased fall risk.   Time 8   Period Weeks   Status New   PT LONG TERM GOAL #3   Title Pt will improve Dynamic Gait Index score to at least 17/24 for decreased fall risk.   Time 8   Period Weeks   Status New   PT LONG TERM GOAL #4   Title Pt will verbalize plans for ongoing community fitness upon D/C from PT.   Time 8   Period Weeks   Status New   PT LONG TERM GOAL #5   Title Pt will ambulate at least 1000 ft indoor and outdoor surfaces without loss of balance, for improved community and outdoor mobility.   Time 8   Period Weeks   Status New               Plan - 06/03/16 1157    Clinical Impression Statement Skilled session continues to focus on high level balance to work towards short and LTG's as well as gait over outdoor surfaces to work towards LTG.  Note that PT educated pt and mother on safety with gait outdoors and having mother provide guarding assist for gait over grassy surfaces.     Rehab Potential Good   PT Frequency 2x / week   PT Duration 8 weeks  plus eval   PT Treatment/Interventions ADLs/Self Care Home Management;Therapeutic exercise;Therapeutic activities;Functional mobility training;Gait training;DME Instruction;Balance training;Neuromuscular re-education;Patient/family education   PT Next Visit Plan go over HEP-add as needed, work on hip and  stepping strategy, braiding, backwards walking, compliant surfaces, narrow BOS   PT Home Exercise Plan See pt instruction 05/27/16   Consulted and Agree with Plan of Care Patient;Family member/caregiver   Family Member Consulted Mom-Kaye  Patient will benefit from skilled therapeutic intervention in order to improve the following deficits and impairments:  Abnormal gait, Decreased balance, Decreased mobility, Decreased coordination, Decreased safety awareness, Difficulty walking, Postural dysfunction  Visit Diagnosis: Other abnormalities of gait and mobility  Unsteadiness on feet     Problem List Patient Active Problem List   Diagnosis Date Noted  . Bleeding duodenal ulcer   . Hypokalemia   . Lactic acidosis   . Duodenal ulcer hemorrhage 10/31/2015  . Urinary retention 10/30/2015  . Acute blood loss anemia 10/29/2015  . Hypotension 10/29/2015  . Syncope and collapse 10/29/2015  . Traumatic brain injury with depressed frontal skull fracture (HCC) 09/07/2012  . Cognitive deficit as late effect of traumatic brain injury (HCC) 09/07/2012  . Episodic dyscontrol syndrome 09/07/2012    Harriet ButteEmily Daisey Caloca, PT, MPT [facility] [address] [place], [place], [address] Phone: [phone]   Fax:  [phone] 06/03/2016, 12:05 PM  Name: Brandt LoosenScott Magnussen MRN: [id number] Date of Birth: 02/03/1966

## 2016-06-06 ENCOUNTER — Encounter: Payer: Self-pay | Admitting: Rehabilitation

## 2016-06-06 ENCOUNTER — Ambulatory Visit: Payer: Medicare Other | Admitting: Rehabilitation

## 2016-06-06 DIAGNOSIS — R2689 Other abnormalities of gait and mobility: Secondary | ICD-10-CM

## 2016-06-06 DIAGNOSIS — R2681 Unsteadiness on feet: Secondary | ICD-10-CM

## 2016-06-06 NOTE — Therapy (Signed)
**Note Zachary-Identified via Obfuscation** [facility] [address] [place], [place], [address] Phone: [phone]   Fax:  [phone]  Physical Therapy Treatment  Patient Details  Name: Zachary Thornton MRN: [id number] Date of Birth: Jan 13, 1966 Referring Provider: Waynard Edwards  Encounter Date: 06/06/2016      PT End of Session - 06/06/16 1021    Visit Number 5   Number of Visits 17   Date for PT Re-Evaluation 07/23/16   Authorization Type Medicare-Gcode every 10th visit   PT Start Time 1016   PT Stop Time 1101   PT Time Calculation (min) 45 min   Equipment Utilized During Treatment --   Activity Tolerance Patient tolerated treatment well   Behavior During Therapy [facility] for tasks assessed/performed      Past Medical History  Diagnosis Date  . TBI (traumatic brain injury) (HCC)   . Back injury   . Dementia with behavioral disturbance   . Duodenal ulcer hemorrhage   . GI bleed   . Blood loss anemia     Past Surgical History  Procedure Laterality Date  . Csf shunt    . Craniotomy      Post TBI craniotomy and plate insertion  . Esophagogastroduodenoscopy (egd) with propofol N/A 10/30/2015    Procedure: ESOPHAGOGASTRODUODENOSCOPY (EGD) WITH PROPOFOL;  Surgeon: Rachael Fee, MD;  Location: [facility];  Service: Endoscopy;  Laterality: N/A;  . Esophagogastroduodenoscopy N/A 11/01/2015    Procedure: ESOPHAGOGASTRODUODENOSCOPY (EGD);  Surgeon: Ruffin Frederick, MD;  Location: [facility];  Service: Gastroenterology;  Laterality: N/A;    There were no vitals filed for this visit.      Subjective Assessment - 06/06/16 1021    Subjective no changes since last visit, no falls.    Patient Stated Goals [name] goals are to improve balance and prevent future falls.   Currently in Pain? No/denies              NMR:  Went over current HEP for corner balance tasks with removal of feet together EO and addition to tandem walking along counter top, see pt  instruction for details on exercises and reps.  Addressed balance and stepping strategy with braiding x 25' x 2 reps with forward facing HHA from therapist and heavy cues initially for stepping.  Note that pt performs with less assist when moving to the R vs to the L.    Gait:  Initiated trial of gait with use of walking sticks to improve fluidity of gait and decrease his wide BOS and "stiff" gait pattern.  Provided heavy hand over hand cues, however did markedly better when cued to "just walk."  Did note improvement in gait pattern and improved efficiency of gait.  Discussed with pt and caregiver that this may be good for outdoor use to improve safety.  Will continue to address in future sessions.                    PT Education - 06/06/16 1021    Education provided Yes   Education Details education on modifications made to HEP, see pt instruction   Person(s) Educated Patient;Caregiver(s)   Methods Explanation   Comprehension Verbalized understanding          PT Short Term Goals - 05/24/16 1241    PT SHORT TERM GOAL #1   Title Pt will perform HEP with family supervision, for improved balance and mobility.  TARGET 06/22/16   Time 4   Period Weeks   Status New  PT SHORT TERM GOAL #2   Title Pt will improve 5x sit<>stand to less than or equal to 17 seconds for improved transfer efficiency and safety.   Time 4   Period Weeks   Status New   PT SHORT TERM GOAL #3   Title Pt will improve TUG score to less than or equal to 13.5 seconds for decreased fall risk.   Time 4   Period Weeks   Status New   PT SHORT TERM GOAL #4   Title Pt will imrpove Berg Balance score to at least 40/56 for decreased fall risk.   Time 4   Period Weeks           PT Long Term Goals - 05/24/16 1243    PT LONG TERM GOAL #1   Title Pt/family will verbalize understanding of fall prevention within home environment.  TARGET 07/23/16   Time 8   Period Weeks   Status New   PT LONG TERM GOAL #2    Title Pt will improve Berg Balance score to at least 45/56 for decreased fall risk.   Time 8   Period Weeks   Status New   PT LONG TERM GOAL #3   Title Pt will improve Dynamic Gait Index score to at least 17/24 for decreased fall risk.   Time 8   Period Weeks   Status New   PT LONG TERM GOAL #4   Title Pt will verbalize plans for ongoing community fitness upon D/C from PT.   Time 8   Period Weeks   Status New   PT LONG TERM GOAL #5   Title Pt will ambulate at least 1000 ft indoor and outdoor surfaces without loss of balance, for improved community and outdoor mobility.   Time 8   Period Weeks   Status New               Plan - 06/06/16 1021    Clinical Impression Statement Skilled session focused on review of HEP, see pt instruction for details as PT removed one and added one exercise.  Also initiated use of B walking sticks in order to improve fluidity of gait and decrease fall risk.  Pt did demonstrate improved efficiency of gait and feel that this may be good option for outdoor gait in order to decrease fall risk.  Will continue to work on this during PT sessions.    Rehab Potential Good   PT Frequency 2x / week   PT Duration 8 weeks  plus eval   PT Treatment/Interventions ADLs/Self Care Home Management;Therapeutic exercise;Therapeutic activities;Functional mobility training;Gait training;DME Instruction;Balance training;Neuromuscular re-education;Patient/family education   PT Next Visit Plan  work on hip and stepping strategy, braiding, backwards walking, compliant surfaces, narrow BOS, gait with B walking sticks (esp outdoors, appropriate?)   PT Home Exercise Plan See pt instruction 05/27/16   Consulted and Agree with Plan of Care Patient;Family member/caregiver   Family Member Consulted Caregiver [name]      Patient will benefit from skilled therapeutic intervention in order to improve the following deficits and impairments:  Abnormal gait, Decreased balance, Decreased  mobility, Decreased coordination, Decreased safety awareness, Difficulty walking, Postural dysfunction  Visit Diagnosis: Other abnormalities of gait and mobility  Unsteadiness on feet     Problem List Patient Active Problem List   Diagnosis Date Noted  . Bleeding duodenal ulcer   . Hypokalemia   . Lactic acidosis   . Duodenal ulcer hemorrhage 10/31/2015  . Urinary retention 10/30/2015  .  Acute blood loss anemia 10/29/2015  . Hypotension 10/29/2015  . Syncope and collapse 10/29/2015  . Traumatic brain injury with depressed frontal skull fracture (HCC) 09/07/2012  . Cognitive deficit as late effect of traumatic brain injury (HCC) 09/07/2012  . Episodic dyscontrol syndrome 09/07/2012    Harriet Butte, PT, MPT [facility] [address] [place], [place], [address] Phone: [phone]   Fax:  [phone] 06/06/2016, 12:26 PM  Name: Zachary Thornton MRN: [id number] Date of Birth: 08-20-1966

## 2016-06-06 NOTE — Patient Instructions (Signed)
All balance exercises, stand in corner with chair in front of you in case you lose your balance. Have mom be with you for the ones where your eyes are closed.    SINGLE LIMB STANCE    Stance: single leg on floor. Raise leg. Hold _15__ seconds. Repeat with other leg. Start by using one hand to hold onto chair. The better you get, work on lightening how much you do with that hand. For example use whole hand, move to 4 fingers, 3 fingers, and so on.  _3__ reps per set on each side, _2__ sets per day, _5-7__ days per week  Copyright  VHI. All rights reserved.    Copyright  VHI. All rights reserved.  Feet Apart (Compliant Surface) Arm Motion - Eyes Open    With eyes open, standing on compliant surface: __stacked pillows or couch cushion (make sure not too thin or thick)______, feet shoulder width apart. Keep arms by your side.  Repeat _3___ times per session for 30 seconds. Do __2__ sessions per day.  Copyright  VHI. All rights reserved.   Feet Together (Compliant Surface) Arm Motion - Eyes Open    With eyes open, standing on compliant surface:__stacked pillows or cushion as above______, feet together. Keep arms by your side.  Repeat _3___ times per session for 30 secs each. Do __2__ sessions per day.  Copyright  VHI. All rights reserved.   Feet Apart (Compliant Surface) Arm Motion - Eyes Closed    Stand on compliant surface: __stacked pillows or cushion______, feet shoulder width apart. Close eyes and keep arms by your side.  Repeat _3___ times per session for 30 secs each. Do __2__ sessions per day.  Copyright  VHI. All rights reserved.   Tandem Walking    Walk beside counter top with hand on counter for support.  Walk with each foot directly in front of other, heel of one foot touching toes of other foot with each step. Both feet straight ahead.  When you are at the end of the counter, then walk backwards in this manner (heel to toe).  Repeat 3 times down and  back.     Copyright  VHI. All rights reserved.

## 2016-06-10 ENCOUNTER — Encounter: Payer: Self-pay | Admitting: Rehabilitation

## 2016-06-10 ENCOUNTER — Ambulatory Visit: Payer: Medicare Other | Admitting: Rehabilitation

## 2016-06-10 DIAGNOSIS — R2689 Other abnormalities of gait and mobility: Secondary | ICD-10-CM | POA: Diagnosis not present

## 2016-06-10 DIAGNOSIS — R2681 Unsteadiness on feet: Secondary | ICD-10-CM

## 2016-06-10 NOTE — Therapy (Signed)
[facility] [address] [place], [place], [address] Phone: [phone]   Fax:  [phone]  Physical Therapy Treatment  Patient Details  Name: Zachary Thornton MRN: [id number] Date of Birth: 1966/12/22 Referring Provider: Waynard Edwards  Encounter Date: 06/10/2016      PT End of Session - 06/10/16 1022    Visit Number 6   Number of Visits 17   Date for PT Re-Evaluation 07/23/16   Authorization Type Medicare-Gcode every 10th visit   PT Start Time 1015   PT Stop Time 1100   PT Time Calculation (min) 45 min   Activity Tolerance Patient tolerated treatment well   Behavior During Therapy [facility] for tasks assessed/performed      Past Medical History  Diagnosis Date  . TBI (traumatic brain injury) (HCC)   . Back injury   . Dementia with behavioral disturbance   . Duodenal ulcer hemorrhage   . GI bleed   . Blood loss anemia     Past Surgical History  Procedure Laterality Date  . Csf shunt    . Craniotomy      Post TBI craniotomy and plate insertion  . Esophagogastroduodenoscopy (egd) with propofol N/A 10/30/2015    Procedure: ESOPHAGOGASTRODUODENOSCOPY (EGD) WITH PROPOFOL;  Surgeon: Rachael Fee, MD;  Location: [facility];  Service: Endoscopy;  Laterality: N/A;  . Esophagogastroduodenoscopy N/A 11/01/2015    Procedure: ESOPHAGOGASTRODUODENOSCOPY (EGD);  Surgeon: Ruffin Frederick, MD;  Location: [facility];  Service: Gastroenterology;  Laterality: N/A;    There were no vitals filed for this visit.      Subjective Assessment - 06/10/16 1022    Subjective no changes since last visit, no falls.    Patient is accompained by: Family member   Patient Stated Goals [name] goals are to improve balance and prevent future falls.   Currently in Pain? No/denies                         [facility] Adult PT Treatment/Exercise - 06/10/16 1029    Ambulation/Gait   Ambulation/Gait Yes   Ambulation/Gait Assistance 4:  Min guard   Ambulation/Gait Assistance Details Continue to trial use of B walking canes during session over indoor and outdoor surfaces.  Note that sequencing is still somewhat difficult, but improved with cues and light hand over hand cues during session.  Also trialed gait with use of rollator, however note that pts BOS too wide and tends to kick wheels, despite max verbal cues during gait.  Discussion with mother than AD may be difficult to lean due to TBI cognitive deficits.  Pt and mother verbalized understanding.    Ambulation Distance (Feet) 345 Feet  then another 500'   Assistive device 4-wheeled walker;Other (Comment)  B walking canes/sticks   Gait Pattern Step-through pattern;Decreased arm swing - right;Decreased step length - left;Decreased step length - right;Decreased hip/knee flexion - right;Right circumduction;Ataxic;Trunk rotated posteriorly on right;Decreased trunk rotation;Wide base of support;Abducted- right;Decreased weight shift to right   Ambulation Surface Level;Unlevel;Indoor;Outdoor;Paved;Grass   Neuro Re-ed    Neuro Re-ed Details  high level balance with gait during session as follows to address stepping strategy and balance: gait with head turns veritcal and horizontal, backwards gait, braiding.  Note marked difficulty with braiding and backwards gait as he tends to lead with trunk. Then progressed to standing on foam mat on ramp x 2 reps of 30 secs then marching in place x 10 reps with continued cues for improved hip  strategy vs correcting with shoulders/trunk.                  PT Education - 06/10/16 1022    Education provided Yes   Education Details Continue to educate on AD, and compliance with HEP   Person(s) Educated Patient;Parent(s)   Methods Explanation   Comprehension Verbalized understanding          PT Short Term Goals - 05/24/16 1241    PT SHORT TERM GOAL #1   Title Pt will perform HEP with family supervision, for improved balance and  mobility.  TARGET 06/22/16   Time 4   Period Weeks   Status New   PT SHORT TERM GOAL #2   Title Pt will improve 5x sit<>stand to less than or equal to 17 seconds for improved transfer efficiency and safety.   Time 4   Period Weeks   Status New   PT SHORT TERM GOAL #3   Title Pt will improve TUG score to less than or equal to 13.5 seconds for decreased fall risk.   Time 4   Period Weeks   Status New   PT SHORT TERM GOAL #4   Title Pt will imrpove Berg Balance score to at least 40/56 for decreased fall risk.   Time 4   Period Weeks           PT Long Term Goals - 05/24/16 1243    PT LONG TERM GOAL #1   Title Pt/family will verbalize understanding of fall prevention within home environment.  TARGET 07/23/16   Time 8   Period Weeks   Status New   PT LONG TERM GOAL #2   Title Pt will improve Berg Balance score to at least 45/56 for decreased fall risk.   Time 8   Period Weeks   Status New   PT LONG TERM GOAL #3   Title Pt will improve Dynamic Gait Index score to at least 17/24 for decreased fall risk.   Time 8   Period Weeks   Status New   PT LONG TERM GOAL #4   Title Pt will verbalize plans for ongoing community fitness upon D/C from PT.   Time 8   Period Weeks   Status New   PT LONG TERM GOAL #5   Title Pt will ambulate at least 1000 ft indoor and outdoor surfaces without loss of balance, for improved community and outdoor mobility.   Time 8   Period Weeks   Status New               Plan - 06/10/16 1023    Clinical Impression Statement Skilled session continued to address safety with gait and choosing appropriate AD in order to do so.  Trialed rollator today without success as pts BOS is too wide and he tends to kick wheel and/or push rollator too far ahead of him during gait.  Also continue to address high level balance during session and stressed importance of compliance with HEP.     Rehab Potential Good   PT Frequency 2x / week   PT Duration 8 weeks  plus  eval   PT Treatment/Interventions ADLs/Self Care Home Management;Therapeutic exercise;Therapeutic activities;Functional mobility training;Gait training;DME Instruction;Balance training;Neuromuscular re-education;Patient/family education   PT Next Visit Plan  work on hip and stepping strategy, braiding, backwards walking, compliant surfaces, narrow BOS   PT Home Exercise Plan See pt instruction 06/07/16   Consulted and Agree with Plan of Care Patient;Family member/caregiver  Family Member Consulted Mother, Purnell ShoemakerKaye      Patient will benefit from skilled therapeutic intervention in order to improve the following deficits and impairments:  Abnormal gait, Decreased balance, Decreased mobility, Decreased coordination, Decreased safety awareness, Difficulty walking, Postural dysfunction  Visit Diagnosis: Other abnormalities of gait and mobility  Unsteadiness on feet     Problem List Patient Active Problem List   Diagnosis Date Noted  . Bleeding duodenal ulcer   . Hypokalemia   . Lactic acidosis   . Duodenal ulcer hemorrhage 10/31/2015  . Urinary retention 10/30/2015  . Acute blood loss anemia 10/29/2015  . Hypotension 10/29/2015  . Syncope and collapse 10/29/2015  . Traumatic brain injury with depressed frontal skull fracture (HCC) 09/07/2012  . Cognitive deficit as late effect of traumatic brain injury (HCC) 09/07/2012  . Episodic dyscontrol syndrome 09/07/2012    Harriet ButteEmily Cay Kath, PT, MPT [facility] [address] [place], [place], [address] Phone: [phone]   Fax:  [phone] 06/10/2016, 1:14 PM  Name: Brandt LoosenScott Langstaff MRN: [id number] Date of Birth: 11/07/1966

## 2016-06-17 ENCOUNTER — Ambulatory Visit: Payer: Medicare Other | Admitting: Rehabilitation

## 2016-06-17 ENCOUNTER — Encounter: Payer: Self-pay | Admitting: Rehabilitation

## 2016-06-17 DIAGNOSIS — R2681 Unsteadiness on feet: Secondary | ICD-10-CM

## 2016-06-17 DIAGNOSIS — R2689 Other abnormalities of gait and mobility: Secondary | ICD-10-CM | POA: Diagnosis not present

## 2016-06-17 NOTE — Patient Instructions (Signed)
Feet Together (Compliant Surface) Arm Motion - Eyes Open    With eyes open, standing on compliant surface:__stacked pillows or cushion as above______, feet together. Keep arms by your side.  Repeat _3___ times per session for 30 secs each. Do __2__ sessions per day.  Feet Apart (Compliant Surface) Varied Arm Positions - Eyes Open    Stand on pillow in the corner with a chair in front of you.  Stand with your feet apart (like in the picture).  Use a small light ball and have your son or mother toss the ball back and fourth with you x 10 reps.  Have them toss it to you, then high and to each side to make it harder.   Weight Shift: Anterior / Posterior (Righting / Equilibrium)    BEGIN WITH BACK LEANING AGAINST THE WALL AND FEET 4 INCHES AWAY. Move your hips off the wall and come to upright standing.  Hold for 5 seconds. Return slowly to the wall letting your hips bump the wall and return to stand.   Hold each position __5__ seconds. Repeat _10__ times per session. Do _1__ sessions per day.

## 2016-06-17 NOTE — Therapy (Signed)
[facility] [address] [place], [place], [address] Phone: [phone]   Fax:  [phone]  Physical Therapy Treatment  Patient Details  Name: Zachary LoosenScott Thornton MRN: [id number] Date of Birth: 11/08/1966 Referring Provider: Waynard EdwardsPerini  Encounter Date: 06/17/2016      PT End of Session - 06/17/16 1024    Visit Number 7   Number of Visits 17   Date for PT Re-Evaluation 07/23/16   Authorization Type Medicare-Gcode every 10th visit   PT Start Time 1019   PT Stop Time 1102   PT Time Calculation (min) 43 min   Activity Tolerance Patient tolerated treatment well   Behavior During Therapy [facility] for tasks assessed/performed      Past Medical History  Diagnosis Date  . TBI (traumatic brain injury) (HCC)   . Back injury   . Dementia with behavioral disturbance   . Duodenal ulcer hemorrhage   . GI bleed   . Blood loss anemia     Past Surgical History  Procedure Laterality Date  . Csf shunt    . Craniotomy      Post TBI craniotomy and plate insertion  . Esophagogastroduodenoscopy (egd) with propofol N/A 10/30/2015    Procedure: ESOPHAGOGASTRODUODENOSCOPY (EGD) WITH PROPOFOL;  Surgeon: Rachael Feeaniel P Jacobs, MD;  Location: [facility];  Service: Endoscopy;  Laterality: N/A;  . Esophagogastroduodenoscopy N/A 11/01/2015    Procedure: ESOPHAGOGASTRODUODENOSCOPY (EGD);  Surgeon: Ruffin FrederickSteven Paul Armbruster, MD;  [facility];  Service: Gastroenterology;  Laterality: N/A;    There were no vitals filed for this visit.      Subjective Assessment - 06/17/16 1022    Subjective Reports [place] [facility], walking went well per mother.     Patient Stated Goals [name] goals are to improve balance and prevent future falls.   Currently in Pain? No/denies            NMR:  Went over HEP and shortened to three exercises in order to improve compliance and get son involved (again for improved compliance).  See pt  instruction for details on exercises.  Progressed with high level balance activities with side stepping ball toss (with son) x 45' x 2 reps, forwards and backwards gait with ball toss x 45' x 2 reps in each direction.  Pt needs max verbal and manual cues when side stepping to maintain position as he tends to turn body to forward face.  Then performed head turns x 50' vertically then head turns another 7850' horizontally.  Note no overt LOB during gait with head turns, however tends to slow speed and keeps arms "locked" by his side.  Attempted to ambulate with pt while PT provided tactile cues for increased arm swing, however pt unable to carryover during any other gait tasks.    FYI:  Discussed with mother that progress is limited with pt and that PT would check STG's next week to determine if pt is progressing.  Progress limited due to cognitive deficits, poor carryover and poor compliance.  Pt and mother verbalized understanding.                       PT Education - 06/17/16 1024    Education provided Yes   Education Details Educated on THREE exercises only for improved compliance.    Person(s) Educated Patient   Methods Explanation;Demonstration;Handout;Verbal cues   Comprehension Verbalized understanding;Returned demonstration          PT Short Term  Goals - 06/17/16 1025    PT SHORT TERM GOAL #1   Title Pt will perform HEP with family supervision, for improved balance and mobility.  TARGET 06/22/16   Time 4   Period Weeks   Status New   PT SHORT TERM GOAL #2   Title Pt will improve 5x sit<>stand to less than or equal to 17 seconds for improved transfer efficiency and safety.   Baseline 20.38 secs on 06/17/16   Time 4   Period Weeks   Status New   PT SHORT TERM GOAL #3   Title Pt will improve TUG score to less than or equal to 13.5 seconds for decreased fall risk.   Time 4   Period Weeks   Status New   PT SHORT TERM GOAL #4   Title Pt will imrpove Berg Balance score to  at least 40/56 for decreased fall risk.   Time 4   Period Weeks           PT Long Term Goals - 05/24/16 1243    PT LONG TERM GOAL #1   Title Pt/family will verbalize understanding of fall prevention within home environment.  TARGET 07/23/16   Time 8   Period Weeks   Status New   PT LONG TERM GOAL #2   Title Pt will improve Berg Balance score to at least 45/56 for decreased fall risk.   Time 8   Period Weeks   Status New   PT LONG TERM GOAL #3   Title Pt will improve Dynamic Gait Index score to at least 17/24 for decreased fall risk.   Time 8   Period Weeks   Status New   PT LONG TERM GOAL #4   Title Pt will verbalize plans for ongoing community fitness upon D/C from PT.   Time 8   Period Weeks   Status New   PT LONG TERM GOAL #5   Title Pt will ambulate at least 1000 ft indoor and outdoor surfaces without loss of balance, for improved community and outdoor mobility.   Time 8   Period Weeks   Status New               Plan - 06/17/16 1238    Clinical Impression Statement Skilled session continues to focus on high level gait with balance to improve safety, esp with gait in community.  Discussed that PT would check STG's next visit and determine whether appropriate or not to continue for next 4 weeks.  Educated that progress is difficult due to cognitive deficits and poor carryover.  Mother verbalized understanding.     Rehab Potential Good   PT Frequency 2x / week   PT Duration 8 weeks  plus eval   PT Treatment/Interventions ADLs/Self Care Home Management;Therapeutic exercise;Therapeutic activities;Functional mobility training;Gait training;DME Instruction;Balance training;Neuromuscular re-education;Patient/family education   PT Next Visit Plan  check STG's (do we need to keep him another 4 weeks) work on hip and stepping strategy, braiding, backwards walking, compliant surfaces, narrow BOS   PT Home Exercise Plan See pt instruction 06/07/16   Consulted and Agree with  Plan of Care Patient;Family member/caregiver   Family Member Consulted Mother, Purnell Shoemaker      Patient will benefit from skilled therapeutic intervention in order to improve the following deficits and impairments:  Abnormal gait, Decreased balance, Decreased mobility, Decreased coordination, Decreased safety awareness, Difficulty walking, Postural dysfunction  Visit Diagnosis: Other abnormalities of gait and mobility  Unsteadiness on feet  Problem List Patient Active Problem List   Diagnosis Date Noted  . Bleeding duodenal ulcer   . Hypokalemia   . Lactic acidosis   . Duodenal ulcer hemorrhage 10/31/2015  . Urinary retention 10/30/2015  . Acute blood loss anemia 10/29/2015  . Hypotension 10/29/2015  . Syncope and collapse 10/29/2015  . Traumatic brain injury with depressed frontal skull fracture (HCC) 09/07/2012  . Cognitive deficit as late effect of traumatic brain injury (HCC) 09/07/2012  . Episodic dyscontrol syndrome 09/07/2012   Harriet ButteEmily Floyd Lusignan, PT, MPT [facility] [address] [place], [place], [address] Phone: [phone]   Fax:  [phone] 06/17/2016, 12:41 PM   Name: Zachary LoosenScott Letterman MRN: [id number] Date of Birth: 12/27/1965

## 2016-06-18 ENCOUNTER — Encounter: Payer: Self-pay | Admitting: Neurology

## 2016-06-18 ENCOUNTER — Telehealth: Payer: Self-pay | Admitting: Neurology

## 2016-06-18 ENCOUNTER — Ambulatory Visit (INDEPENDENT_AMBULATORY_CARE_PROVIDER_SITE_OTHER): Payer: Medicare Other | Admitting: Neurology

## 2016-06-18 VITALS — BP 138/66 | HR 102 | Ht 71.0 in | Wt 184.0 lb

## 2016-06-18 DIAGNOSIS — S069X5D Unspecified intracranial injury with loss of consciousness greater than 24 hours with return to pre-existing conscious level, subsequent encounter: Secondary | ICD-10-CM

## 2016-06-18 DIAGNOSIS — D6489 Other specified anemias: Secondary | ICD-10-CM

## 2016-06-18 DIAGNOSIS — R569 Unspecified convulsions: Secondary | ICD-10-CM

## 2016-06-18 NOTE — Telephone Encounter (Signed)
PT thought we needed to schedule an EEG, can you find out and if so I can schedule one for them/Dawn

## 2016-06-18 NOTE — Progress Notes (Signed)
Chart forwarded.  

## 2016-06-18 NOTE — Patient Instructions (Signed)
1.  We will continue Keppra 500mg  twice daily Seizures People with seizures have times when they shake and jerk uncontrollably (seizures). This happens when there is a sudden change in brain function. Epilepsy may have many possible causes. Anything that disturbs the normal pattern of brain cell activity can lead to seizures. HOME CARE   Follow your doctor's instructions about driving and safety during normal activities.  Get enough sleep.  Only take medicine as told by your doctor.  Avoid things that you know can cause you to have seizures (triggers).  Write down when your seizures happen and what you remember about each seizure. Write down anything you think may have caused the seizure to happen.  Tell the people you live and work with that you have seizures. Make sure they know how to help you. They should:  Cushion your head and body.  Turn you on your side.  Not restrain you.  Not place anything inside your mouth.  Call for local emergency medical help if there is any question about what has happened.  Keep all follow-up visits with your doctor. This is very important. GET HELP IF:  You get an infection or start to feel sick. You may have more seizures when you are sick.  You are having seizures more often.  Your seizure pattern is changing. GET HELP RIGHT AWAY IF:   A seizure does not stop after a few seconds or minutes.  A seizure causes you to have trouble breathing.  A seizure gives you a very bad headache.  A seizure makes you unable to speak or use a part of your body.   This information is not intended to replace advice given to you by your health care provider. Make sure you discuss any questions you have with your health care provider.   Document Released: 10/06/2009 Document Revised: 09/29/2013 Document Reviewed: 07/21/2013 Elsevier Interactive Patient Education [employer]2016 Elsevier Inc.  Follow up in 3 months.

## 2016-06-18 NOTE — Progress Notes (Signed)
NEUROLOGY CONSULTATION NOTE  Zachary LoosenScott Sloniker MRN: [id number] DOB: [birth year]  Referring provider: Dr. Waynard EdwardsPerini Primary care provider: Dr. Waynard EdwardsPerini  Reason for consult:  seizure  HISTORY OF PRESENT ILLNESS: Zachary Thornton is a 50 year old man with TBI and history of GI bleed who presents for seizure and headaches.  History supplemented by his mother and ED notes.  He had a traumatic brain injury in 2012 after falling and hitting the back of his head on concrete.  He fractured his skull and sustained intracranial bleeding, requiring right craniectomy and VP shunt. [name].  He was in a coma for 6 weeks.  After 2 month hospitalization, he was discharged to inpatient rehab facility where he had to relearn how to walk, talk and feed himself.  His wife was unable to care for him and he subsequently moved in with his mother, who is his guardian.  [place].   He was hospitalized in December for GI bleed due to duodenal ulcer, requiring multiple blood transfusions.  He presented to the ED on 04/29/16 following an unwitnessed fall in which he hit his head.  He did not recall the event.  His mother heard him fall in the bathroom and found him on the floor with eyes rolled back, unresponsive with flexed posturing and shaking of both upper extremities, lasting about 2 minutes.  He had bowel and bladder incontinence.  He was slightly confused afterwards for about 10 minutes.  He did not exhibit foaming at the mouth or tongue biting.  CT of head was personally reviewed and showed chronic changes of traumatic brain injury with bifrontal and temporal encephalomalacia, as well as post surgical changes of right craniectomy with cranioplasty and left frontal approach VP shunt.  No evidence of hydrocephalus seen.  He was started on Keppra.  He has not had any recurrent spells.  He was found to have worsening anemia and received further transfusions by his PCP.  He does not drive.  He has no prior history of  seizures.  PAST MEDICAL HISTORY: Past Medical History  Diagnosis Date  . TBI (traumatic brain injury) (HCC)   . Back injury   . Dementia with behavioral disturbance   . Duodenal ulcer hemorrhage   . GI bleed   . Blood loss anemia     PAST SURGICAL HISTORY: Past Surgical History  Procedure Laterality Date  . Csf shunt    . Craniotomy      Post TBI craniotomy and plate insertion  . Esophagogastroduodenoscopy (egd) with propofol N/A 10/30/2015    Procedure: ESOPHAGOGASTRODUODENOSCOPY (EGD) WITH PROPOFOL;  Surgeon: Rachael Feeaniel P Jacobs, MD;  Location: [facility];  Service: Endoscopy;  Laterality: N/A;  . Esophagogastroduodenoscopy N/A 11/01/2015    Procedure: ESOPHAGOGASTRODUODENOSCOPY (EGD);  Surgeon: Ruffin FrederickSteven Paul Armbruster, MD;  [facility];  Service: Gastroenterology;  Laterality: N/A;    MEDICATIONS: Current Outpatient Prescriptions on File Prior to Visit  Medication Sig Dispense Refill  . carbamazepine (TEGRETOL) 200 MG tablet Take 600 mg by mouth 2 (two) times daily.    . cholecalciferol (VITAMIN D) 1000 UNITS tablet Take 1,000 Units by mouth daily.    [name]. escitalopram (LEXAPRO) 20 MG tablet Take 20 mg by mouth daily.    [name]. levETIRAcetam (KEPPRA) 500 MG tablet Take 1 tablet (500 mg total) by mouth 2 (two) times daily. 60 tablet 0  . Multiple Vitamin (MULTIVITAMIN WITH MINERALS) TABS Take 1 tablet by mouth daily.    . pantoprazole (PROTONIX) 40  MG tablet TAKE 1 TABLET TWICE DAILY. 60 tablet 0  . tamsulosin (FLOMAX) 0.4 MG CAPS capsule Take 1 capsule (0.4 mg total) by mouth daily after breakfast. 30 capsule 0   No current facility-administered medications on file prior to visit.    ALLERGIES: Allergies  Allergen Reactions  . Nuedexta [Dextromethorphan-Quinidine] Anaphylaxis    Lips began to swell  . Nsaids Other (See Comments)    Due to GI bleeding    FAMILY HISTORY: Family History  Problem Relation Age of Onset  . Heart disease Father     MVR  . Hypertension  Mother   . Stroke Maternal Aunt     SOCIAL HISTORY: Social History   Social History  . Marital Status: Married    Spouse Name: N/A  . Number of Children: 1  . Years of Education: N/A   Occupational History  . Disabled    Social History Main Topics  . Smoking status: Former Games developer  . Smokeless tobacco: Never Used  . Alcohol Use: No  . Drug Use: No  . Sexual Activity: Yes   Other Topics Concern  . Not on file   Social History Narrative   Lives with his mother.  Normally ambulates without assistance.      REVIEW OF SYSTEMS: Constitutional: No fevers, chills, or sweats, no generalized fatigue, change in appetite Eyes: No visual changes, double vision, eye pain Ear, nose and throat: No hearing loss, ear pain, nasal congestion, sore throat Cardiovascular: No chest pain, palpitations Respiratory:  No shortness of breath at rest or with exertion, wheezes GastrointestinaI: No nausea, vomiting, diarrhea, abdominal pain, fecal incontinence Genitourinary:  No dysuria, urinary retention or frequency Musculoskeletal:  No neck pain, back pain Integumentary: No rash, pruritus, skin lesions Neurological: as above Psychiatric: No depression, insomnia, anxiety Endocrine: No palpitations, fatigue, diaphoresis, mood swings, change in appetite, change in weight, increased thirst Hematologic/Lymphatic:  No purpura, petechiae. Allergic/Immunologic: no itchy/runny eyes, nasal congestion, recent allergic reactions, rashes  PHYSICAL EXAM: Filed Vitals:   06/18/16 0954  BP: 138/66  Pulse: 102   General: No acute distress.  Patient appears well-groomed.  Head:  Normocephalic/atraumatic Eyes:  fundi examined but not visualized Neck: supple, no paraspinal tenderness, full range of motion Back: No paraspinal tenderness Heart: regular rate and rhythm Lungs: Clear to auscultation bilaterally. Vascular: No carotid bruits. Neurological Exam: Mental status: alert and oriented to person,  place, and time, recent and remote memory intact, fund of knowledge intact, attention and concentration intact, speech fluent and not dysarthric, language intact. MMSE - Mini Mental State Exam 06/18/2016  Orientation to time 5  Orientation to Place 4  Registration 3  Attention/ Calculation 5  Recall 3  Language- name 2 objects 2  Language- repeat 1  Language- follow 3 step command 3  Language- read & follow direction 1  Write a sentence 1  Copy design 1  Total score 29   Cranial nerves: CN I: not tested CN II: pupils equal, round and reactive to light, visual fields intact CN III, IV, VI:  full range of motion, no nystagmus, no ptosis CN V: facial sensation intact CN VII: upper and lower face symmetric CN VIII: hearing intact CN IX, X: gag intact, uvula midline CN XI: sternocleidomastoid and trapezius muscles intact CN XII: tongue midline Bulk & Tone: Increased tone in the left upper and lower extremity. Motor:  5/5 throughout Sensation:  Pinprick and vibration sensation intact. Deep Tendon Reflexes:  2+ throughout, toes downgoing.  Finger to nose  testing:  Without dysmetria. Gait:  Left hemiplegic gait  Able to turn and tandem walk. Romberg positive.  IMPRESSION: Probable symptomatic seizure as late effect of traumatic brain injury   PLAN: Continue Keppra 500mg  twice daily Check baseline EEG Follow up in 3 months.   Thank you for allowing me to take part in the care of this patient.  Shon MilletAdam Jaffe, DO  CC:  Rodrigo RanMark Perini, MD

## 2016-06-18 NOTE — Telephone Encounter (Signed)
EEG is needed. Order has been placed. [facility]ara [facility]. Called and left pt and mother a vm about scheduling EEG.

## 2016-06-18 NOTE — Telephone Encounter (Deleted)
Just a routine EEG?

## 2016-06-20 ENCOUNTER — Encounter: Payer: Self-pay | Admitting: Rehabilitation

## 2016-06-20 ENCOUNTER — Ambulatory Visit: Payer: Medicare Other | Admitting: Rehabilitation

## 2016-06-20 DIAGNOSIS — R2689 Other abnormalities of gait and mobility: Secondary | ICD-10-CM

## 2016-06-20 DIAGNOSIS — R2681 Unsteadiness on feet: Secondary | ICD-10-CM

## 2016-06-20 NOTE — Patient Instructions (Signed)
Fall Prevention in the Home  Falls can cause injuries and can affect people from all age groups. There are many simple things that you can do to make your home safe and to help prevent falls. WHAT CAN I DO ON THE OUTSIDE OF MY HOME?  Regularly repair the edges of walkways and driveways and fix any cracks.  Remove high doorway thresholds.  Trim any shrubbery on the main path into your home.  Use bright outdoor lighting.  Clear walkways of debris and clutter, including tools and rocks.  Regularly check that handrails are securely fastened and in good repair. Both sides of any steps should have handrails.  Install guardrails along the edges of any raised decks or porches.  Have leaves, snow, and ice cleared regularly.  Use sand or salt on walkways during winter months.  In the garage, clean up any spills right away, including grease or oil spills. WHAT CAN I DO IN THE BATHROOM?  Use night lights.  Install grab bars by the toilet and in the tub and shower. Do not use towel bars as grab bars.  Use non-skid mats or decals on the floor of the tub or shower.  If you need to sit down while you are in the shower, use a plastic, non-slip stool..  Keep the floor dry. Immediately clean up any water that spills on the floor.  Remove soap buildup in the tub or shower on a regular basis.  Attach bath mats securely with double-sided non-slip rug tape.  Remove throw rugs and other tripping hazards from the floor. WHAT CAN I DO IN THE BEDROOM?  Use night lights.  Make sure that a bedside light is easy to reach.  Do not use oversized bedding that drapes onto the floor.  Have a firm chair that has side arms to use for getting dressed.  Remove throw rugs and other tripping hazards from the floor. WHAT CAN I DO IN THE KITCHEN?   Clean up any spills right away.  Avoid walking on wet floors.  Place frequently used items in easy-to-reach places.  If you need to reach for something  above you, use a sturdy step stool that has a grab bar.  Keep electrical cables out of the way.  Do not use floor polish or wax that makes floors slippery. If you have to use wax, make sure that it is non-skid floor wax.  Remove throw rugs and other tripping hazards from the floor. WHAT CAN I DO IN THE STAIRWAYS?  Do not leave any items on the stairs.  Make sure that there are handrails on both sides of the stairs. Fix handrails that are broken or loose. Make sure that handrails are as long as the stairways.  Check any carpeting to make sure that it is firmly attached to the stairs. Fix any carpet that is loose or worn.  Avoid having throw rugs at the top or bottom of stairways, or secure the rugs with carpet tape to prevent them from moving.  Make sure that you have a light switch at the top of the stairs and the bottom of the stairs. If you do not have them, have them installed. WHAT ARE SOME OTHER FALL PREVENTION TIPS?  Wear closed-toe shoes that fit well and support your feet. Wear shoes that have rubber soles or low heels.  When you use a stepladder, make sure that it is completely opened and that the sides are firmly locked. Have someone hold the ladder while you   are using it. Do not climb a closed stepladder.  Add color or contrast paint or tape to grab bars and handrails in your home. Place contrasting color strips on the first and last steps.  Use mobility aids as needed, such as canes, walkers, scooters, and crutches.  Turn on lights if it is dark. Replace any light bulbs that burn out.  Set up furniture so that there are clear paths. Keep the furniture in the same spot.  Fix any uneven floor surfaces.  Choose a carpet design that does not hide the edge of steps of a stairway.  Be aware of any and all pets.  Review your medicines with your healthcare provider. Some medicines can cause dizziness or changes in blood pressure, which increase your risk of falling. Talk  with your health care provider about other ways that you can decrease your risk of falls. This may include working with a physical therapist or trainer to improve your strength, balance, and endurance.   This information is not intended to replace advice given to you by your health care provider. Make sure you discuss any questions you have with your health care provider.   Document Released: 11/29/2002 Document Revised: 04/25/2015 Document Reviewed: 01/13/2015 Elsevier Interactive Patient Education 2016 Elsevier Inc.  

## 2016-06-20 NOTE — Therapy (Signed)
[place] [address] [place], [place], [address] Phone: [phone]   Fax:  [phone]  Physical Therapy Treatment  Patient Details  Name: Zachary Thornton MRN: [id number] Date of Birth: 06/26/1966 Referring Provider: Joylene Draft  Encounter Date: 06/20/2016      PT End of Session - 06/20/16 0934    Visit Number 8   Number of Visits 17   Date for PT Re-Evaluation 07/23/16   Authorization Type Medicare-Gcode every 10th visit   PT Start Time 0932   PT Stop Time 1017   PT Time Calculation (min) 45 min   Activity Tolerance Patient tolerated treatment well   Behavior During Therapy [facility] for tasks assessed/performed      Past Medical History  Diagnosis Date  . TBI (traumatic brain injury) ([place])   . Back injury   . Dementia with behavioral disturbance   . Duodenal ulcer hemorrhage   . GI bleed   . Blood loss anemia     Past Surgical History  Procedure Laterality Date  . Csf shunt    . Craniotomy      Post TBI craniotomy and plate insertion  . Esophagogastroduodenoscopy (egd) with propofol N/A 10/30/2015    Procedure: ESOPHAGOGASTRODUODENOSCOPY (EGD) WITH PROPOFOL;  Surgeon: Milus Banister, MD;  Location: [facility];  Service: Endoscopy;  Laterality: N/A;  . Esophagogastroduodenoscopy N/A 11/01/2015    Procedure: ESOPHAGOGASTRODUODENOSCOPY (EGD);  Surgeon: Manus Gunning, MD;  Location: [facility];  Service: Gastroenterology;  Laterality: N/A;    There were no vitals filed for this visit.      Subjective Assessment - 06/20/16 0934    Subjective Reports no changes since last visit, no falls.    Patient Stated Goals [name] goals are to improve balance and prevent future falls.   Currently in Pain? No/denies                         [facility] Adult PT Treatment/Exercise - 06/20/16 0938    Standardized Balance Assessment   Standardized Balance Assessment Berg Balance Test   Berg Balance Test    Sit to Stand Able to stand without using hands and stabilize independently   Standing Unsupported Able to stand safely 2 minutes   Sitting with Back Unsupported but Feet Supported on Floor or Stool Able to sit safely and securely 2 minutes   Stand to Sit Sits safely with minimal use of hands   Transfers Able to transfer safely, minor use of hands   Standing Unsupported with Eyes Closed Able to stand 10 seconds safely   Standing Ubsupported with Feet Together Able to place feet together independently and stand for 1 minute with supervision   From Standing, Reach Forward with Outstretched Arm Can reach confidently >25 cm (10")   From Standing Position, Pick up Object from Floor Able to pick up shoe, needs supervision   From Standing Position, Turn to Look Behind Over each Shoulder Needs supervision when turning   Turn 360 Degrees Able to turn 360 degrees safely but slowly   Standing Unsupported, Alternately Place Feet on Step/Stool Able to complete >2 steps/needs minimal assist   Standing Unsupported, One Foot in Front Able to take small step independently and hold 30 seconds   Standing on One Leg Tries to lift leg/unable to hold 3 seconds but remains standing independently   Total Score 41       NMR:  Performed BERG balance test to assess STG.  See  details above.  Pt met goal with 41/56.  Ended session with high level balance and gait x 500' with head turns (vertical and horizontal), increasing and decreasing speed, changing directions, sudden stops.  Pt able to perform all at S to mod I level.    TA:  Performed TUG during session with avg time of 15.80 secs, only partially meeting goal.  Also performed 5TSS time of 17.87 secs.           PT Education - 06/20/16 0934    Education provided Yes   Education Details Educated on fall prevention strategies, see pt instruction for handout provided.    Person(s) Educated Patient;Parent(s)   Methods Explanation;Handout   Comprehension  Verbalized understanding          PT Short Term Goals - 06/20/16 0935    PT SHORT TERM GOAL #1   Title Pt will perform HEP with family supervision, for improved balance and mobility.  TARGET 06/22/16   Baseline met 06/20/16   Time 4   Period Weeks   Status Achieved   PT SHORT TERM GOAL #2   Title Pt will improve 5x sit<>stand to less than or equal to 17 seconds for improved transfer efficiency and safety.   Baseline 17.87 secs on 06/20/16   Time 4   Period Weeks   Status Achieved   PT SHORT TERM GOAL #3   Title Pt will improve TUG score to less than or equal to 13.5 seconds for decreased fall risk.   Baseline 15.80 secs on 06/20/16   Time 4   Period Weeks   Status Partially Met   PT SHORT TERM GOAL #4   Title Pt will imrpove Berg Balance score to at least 40/56 for decreased fall risk.   Baseline 41/56 BERG on 06/20/16   Time 4   Period Weeks   Status Achieved           PT Long Term Goals - 05/24/16 1243    PT LONG TERM GOAL #1   Title Pt/family will verbalize understanding of fall prevention within home environment.  TARGET 07/23/16   Time 8   Period Weeks   Status New   PT LONG TERM GOAL #2   Title Pt will improve Berg Balance score to at least 45/56 for decreased fall risk.   Time 8   Period Weeks   Status New   PT LONG TERM GOAL #3   Title Pt will improve Dynamic Gait Index score to at least 17/24 for decreased fall risk.   Time 8   Period Weeks   Status New   PT LONG TERM GOAL #4   Title Pt will verbalize plans for ongoing community fitness upon D/C from PT.   Time 8   Period Weeks   Status New   PT LONG TERM GOAL #5   Title Pt will ambulate at least 1000 ft indoor and outdoor surfaces without loss of balance, for improved community and outdoor mobility.   Time 8   Period Weeks   Status New               Plan - 06/20/16 1236    Clinical Impression Statement Skilled session focused on checking STGs to assess progress.  Pt has met 3/4 STGs but has  made progress with TUG score.  Discussed results with pt and mother.  Will continue POC to work towards [place].     Rehab Potential Good   PT Frequency 2x / week  PT Duration 8 weeks  plus eval   PT Treatment/Interventions ADLs/Self Care Home Management;Therapeutic exercise;Therapeutic activities;Functional mobility training;Gait training;DME Instruction;Balance training;Neuromuscular re-education;Patient/family education   PT Next Visit Plan  work on hip and stepping strategy, braiding, backwards walking, compliant surfaces, narrow BOS, outdoor gait   PT Home Exercise Plan See pt instruction 06/07/16   Consulted and Agree with Plan of Care Patient;Family member/caregiver   Family Member Consulted Mother, [name]      Patient will benefit from skilled therapeutic intervention in order to improve the following deficits and impairments:  Abnormal gait, Decreased balance, Decreased mobility, Decreased coordination, Decreased safety awareness, Difficulty walking, Postural dysfunction  Visit Diagnosis: Other abnormalities of gait and mobility  Unsteadiness on feet     Problem List Patient Active Problem List   Diagnosis Date Noted  . Bleeding duodenal ulcer   . Hypokalemia   . Lactic acidosis   . Duodenal ulcer hemorrhage 10/31/2015  . Urinary retention 10/30/2015  . Acute blood loss anemia 10/29/2015  . Hypotension 10/29/2015  . Syncope and collapse 10/29/2015  . Traumatic brain injury with depressed frontal skull fracture ([place]) 09/07/2012  . Cognitive deficit as late effect of traumatic brain injury ([place]) 09/07/2012  . Episodic dyscontrol syndrome 09/07/2012    Cameron Sprang, PT, MPT [facility] [address] [place], [place], [address] Phone: [phone]   Fax:  [phone] 06/20/2016, 12:44 PM  Name: Kaidence Callaway MRN: [id number] Date of Birth: 1966/08/05

## 2016-06-24 ENCOUNTER — Other Ambulatory Visit: Payer: Self-pay | Admitting: Gastroenterology

## 2016-06-26 ENCOUNTER — Ambulatory Visit: Payer: Medicare Other | Attending: Internal Medicine | Admitting: Physical Therapy

## 2016-06-26 DIAGNOSIS — R2689 Other abnormalities of gait and mobility: Secondary | ICD-10-CM | POA: Diagnosis not present

## 2016-06-26 DIAGNOSIS — R2681 Unsteadiness on feet: Secondary | ICD-10-CM | POA: Insufficient documentation

## 2016-06-26 NOTE — Therapy (Signed)
[place] [address] [place], [place], [address] Phone: [phone]   Fax:  [phone]  Physical Therapy Treatment  Patient Details  Name: Zachary Thornton MRN: [id number] Date of Birth: Oct 24, 1966 Referring Provider: Joylene Draft  Encounter Date: 06/26/2016      PT End of Session - 06/26/16 1106    Visit Number 9   Number of Visits 17   PT Start Time 5465   PT Stop Time 1100   PT Time Calculation (min) 45 min   Equipment Utilized During Treatment Gait belt   Activity Tolerance Patient tolerated treatment well   Behavior During Therapy [facility] for tasks assessed/performed      Past Medical History  Diagnosis Date  . TBI (traumatic brain injury) ([place])   . Back injury   . Dementia with behavioral disturbance   . Duodenal ulcer hemorrhage   . GI bleed   . Blood loss anemia     Past Surgical History  Procedure Laterality Date  . Csf shunt    . Craniotomy      Post TBI craniotomy and plate insertion  . Esophagogastroduodenoscopy (egd) with propofol N/A 10/30/2015    Procedure: ESOPHAGOGASTRODUODENOSCOPY (EGD) WITH PROPOFOL;  Surgeon: Milus Banister, MD;  Location: [facility];  Service: Endoscopy;  Laterality: N/A;  . Esophagogastroduodenoscopy N/A 11/01/2015    Procedure: ESOPHAGOGASTRODUODENOSCOPY (EGD);  Surgeon: Manus Gunning, MD;  Location: [facility];  Service: Gastroenterology;  Laterality: N/A;    There were no vitals filed for this visit.      Subjective Assessment - 06/26/16 1035    Subjective Mother stated no changes ; confirmed last sezuire was about a month ago; pt has no pain complaints; said he's feeling good. spoke with mother; state that it's hard time-wise to get in HEP daily; we spoke about how to stimulate balance ex's during ADL's    Currently in Pain? No/denies             Therapeutic exercise  Floor transfer   5 reps- alternate from supine and prone;  Quad to tall kneel to  stand  And supine to sit to stand  2 bouts  Walking outdoors; 1/2 distance around facility; varied terrain w/ gait belt;  Tired to use verbal ques to get him to narrow base of support;  Gait belt and CGA  Standing balance in front of mirror ;  Had him copy my foot placement ; progressed from wide base ; to narrow to tandem;  Progressed with head mvt side to side  X 20 reps  2 bouts    Mother, observed and I educated throughout how to stimulate balance reactions during daily activity;ie. Grocery shopping                      PT Education - 06/26/16 1104    Education Details Discussed, with mother, how to stimulate balance reactions during daily activity; i.e. shopping; moving head side to side looking at items.  Also recommended practicing floor transfers for ther ex with focus on safety; also disucssed possibility of using her treadmill; recommended we assess his safety here at next visit / using a treadmill     Person(s) Educated Parent(s)   Methods Explanation   Comprehension Verbalized understanding          PT Short Term Goals - 06/20/16 0935    PT SHORT TERM GOAL #1   Title Pt will perform HEP with family supervision, for improved balance  and mobility.  TARGET 06/22/16   Baseline met 06/20/16   Time 4   Period Weeks   Status Achieved   PT SHORT TERM GOAL #2   Title Pt will improve 5x sit<>stand to less than or equal to 17 seconds for improved transfer efficiency and safety.   Baseline 17.87 secs on 06/20/16   Time 4   Period Weeks   Status Achieved   PT SHORT TERM GOAL #3   Title Pt will improve TUG score to less than or equal to 13.5 seconds for decreased fall risk.   Baseline 15.80 secs on 06/20/16   Time 4   Period Weeks   Status Partially Met   PT SHORT TERM GOAL #4   Title Pt will imrpove Berg Balance score to at least 40/56 for decreased fall risk.   Baseline 41/56 BERG on 06/20/16   Time 4   Period Weeks   Status Achieved           PT Long  Term Goals - 05/24/16 1243    PT LONG TERM GOAL #1   Title Pt/family will verbalize understanding of fall prevention within home environment.  TARGET 07/23/16   Time 8   Period Weeks   Status New   PT LONG TERM GOAL #2   Title Pt will improve Berg Balance score to at least 45/56 for decreased fall risk.   Time 8   Period Weeks   Status New   PT LONG TERM GOAL #3   Title Pt will improve Dynamic Gait Index score to at least 17/24 for decreased fall risk.   Time 8   Period Weeks   Status New   PT LONG TERM GOAL #4   Title Pt will verbalize plans for ongoing community fitness upon D/C from PT.   Time 8   Period Weeks   Status New   PT LONG TERM GOAL #5   Title Pt will ambulate at least 1000 ft indoor and outdoor surfaces without loss of balance, for improved community and outdoor mobility.   Time 8   Period Weeks   Status New               Plan - 06/26/16 1108    Clinical Impression Statement [name] compensatory gait pattern with wide base of support/ ridgid frame and circumdution of right LE is functional; he was able to walk on vared terrain and with the minimal loss of balance he was able to recover w/o assistance;  narrowing his base of support is very challenging for him ; but within this tx session saw an improved quality of standing with feet together; He was albe to do the floor transfers with just SBA and verbal ques;  it was physiically taxing but he recovered quickly with no c/o pain  or dizziness;  Mother needs further education/ problem solving to incorporate effect ex's into their daily routine;     Rehab Potential Poor   PT Next Visit Plan review floor transfers and narrow BOS ex's  ;  per family request try the treadmill to see if it can be a safe addition to his HEP   Consulted and Agree with Plan of Care Family member/caregiver   Family Member Consulted mohter [name]      Patient will benefit from skilled therapeutic intervention in order to improve the following  deficits and impairments:  Abnormal gait, Decreased coordination, Difficulty walking, Decreased balance  Visit Diagnosis: Other abnormalities of gait and mobility  Unsteadiness  on feet     Problem List Patient Active Problem List   Diagnosis Date Noted  . Bleeding duodenal ulcer   . Hypokalemia   . Lactic acidosis   . Duodenal ulcer hemorrhage 10/31/2015  . Urinary retention 10/30/2015  . Acute blood loss anemia 10/29/2015  . Hypotension 10/29/2015  . Syncope and collapse 10/29/2015  . Traumatic brain injury with depressed frontal skull fracture ([place]) 09/07/2012  . Cognitive deficit as late effect of traumatic brain injury ([place]) 09/07/2012  . Episodic dyscontrol syndrome 09/07/2012    Rosaura Carpenter D PT DPT 06/26/2016, 11:19 AM  [place] [address] [place], [place], [address] Phone: [phone]   Fax:  [phone]  Name: Zachary Thornton MRN: [id number] Date of Birth: [birth year]

## 2016-06-28 ENCOUNTER — Ambulatory Visit: Payer: Medicare Other | Admitting: Rehabilitation

## 2016-07-01 ENCOUNTER — Ambulatory Visit: Payer: Medicare Other | Admitting: Rehabilitation

## 2016-07-03 ENCOUNTER — Encounter: Payer: Self-pay | Admitting: Physical Therapy

## 2016-07-03 ENCOUNTER — Ambulatory Visit: Payer: Medicare Other | Admitting: Physical Therapy

## 2016-07-03 VITALS — BP 127/85 | HR 80 | Resp 16

## 2016-07-03 DIAGNOSIS — R2681 Unsteadiness on feet: Secondary | ICD-10-CM | POA: Diagnosis not present

## 2016-07-03 DIAGNOSIS — R2689 Other abnormalities of gait and mobility: Secondary | ICD-10-CM | POA: Diagnosis not present

## 2016-07-03 NOTE — Therapy (Signed)
[place] [address] [place], [place], [address] Phone: [phone]   Fax:  [phone]  Physical Therapy Treatment  Patient Details  Name: Zachary Thornton MRN: [id number] Date of Birth: [birth year] Referring Provider: Joylene Draft  Encounter Date: 07/03/2016      PT End of Session - 07/03/16 1114    Visit Number 10   Number of Visits 17   PT Start Time 5102   PT Stop Time 1110   PT Time Calculation (min) 40 min   Equipment Utilized During Treatment Gait belt   Activity Tolerance Patient tolerated treatment well   Behavior During Therapy [facility] for tasks assessed/performed      Past Medical History  Diagnosis Date  . TBI (traumatic brain injury) ([place])   . Back injury   . Dementia with behavioral disturbance   . Duodenal ulcer hemorrhage   . GI bleed   . Blood loss anemia     Past Surgical History  Procedure Laterality Date  . Csf shunt    . Craniotomy      Post TBI craniotomy and plate insertion  . Esophagogastroduodenoscopy (egd) with propofol N/A 10/30/2015    Procedure: ESOPHAGOGASTRODUODENOSCOPY (EGD) WITH PROPOFOL;  Surgeon: Milus Banister, MD;  Location: [facility];  Service: Endoscopy;  Laterality: N/A;  . Esophagogastroduodenoscopy N/A 11/01/2015    Procedure: ESOPHAGOGASTRODUODENOSCOPY (EGD);  Surgeon: Manus Gunning, MD;  Location: [facility];  Service: Gastroenterology;  Laterality: N/A;    Filed Vitals:   07/03/16 1049  BP: 127/85  Pulse: 80  Resp: 16        Subjective Assessment - 07/03/16 1050    Subjective No changes since last visit; he stated he was a little sore but no problem; no new complaints   Patient is accompained by: Family member   Currently in Pain? No/denies                Therapeutic exercise  Used FSST- four square step test  Set  Up for step reflex and balance control ex's;  Stepping forward/back /side vq's for slow steps; high as possible; mutli  bouts; added holding large physioball and reaching ball forward/side back before steppng;  Stand to floor transfer  X 10;  Stand to prone Squats holding bar overhead  2 x 10                  PT Education - 07/03/16 1112    Education provided Yes   Education Details discussed with sister, ideas during ADL's to help involve [place] to be more involved and work on independence as much as possible; also to do HEP to cont to build balance and strength with goal of minimizing level of care   Person(s) Educated Other (comment)   Methods Explanation   Comprehension Verbalized understanding          PT Short Term Goals - 06/20/16 0935    PT SHORT TERM GOAL #1   Title Pt will perform HEP with family supervision, for improved balance and mobility.  TARGET 06/22/16   Baseline met 06/20/16   Time 4   Period Weeks   Status Achieved   PT SHORT TERM GOAL #2   Title Pt will improve 5x sit<>stand to less than or equal to 17 seconds for improved transfer efficiency and safety.   Baseline 17.87 secs on 06/20/16   Time 4   Period Weeks   Status Achieved   PT SHORT TERM GOAL #3  Title Pt will improve TUG score to less than or equal to 13.5 seconds for decreased fall risk.   Baseline 15.80 secs on 06/20/16   Time 4   Period Weeks   Status Partially Met   PT SHORT TERM GOAL #4   Title Pt will imrpove Berg Balance score to at least 40/56 for decreased fall risk.   Baseline 41/56 BERG on 06/20/16   Time 4   Period Weeks   Status Achieved           PT Long Term Goals - 05/24/16 1243    PT LONG TERM GOAL #1   Title Pt/family will verbalize understanding of fall prevention within home environment.  TARGET 07/23/16   Time 8   Period Weeks   Status New   PT LONG TERM GOAL #2   Title Pt will improve Berg Balance score to at least 45/56 for decreased fall risk.   Time 8   Period Weeks   Status New   PT LONG TERM GOAL #3   Title Pt will improve Dynamic Gait Index score to at least 17/24  for decreased fall risk.   Time 8   Period Weeks   Status New   PT LONG TERM GOAL #4   Title Pt will verbalize plans for ongoing community fitness upon D/C from PT.   Time 8   Period Weeks   Status New   PT LONG TERM GOAL #5   Title Pt will ambulate at least 1000 ft indoor and outdoor surfaces without loss of balance, for improved community and outdoor mobility.   Time 8   Period Weeks   Status New               Plan - 2016-07-25 1115    Clinical Impression Statement Pt tolerated the ther ex well; he had 4 loss of balance that he was able to recover with just CGA; step reflex is diminshed due to ataxic mvt quality; Mother and sister both made statements that the HEP is not happening; need to help them incorporate actiivity - even if its vacuuming to help pt with tasks that will challenge hiim and improve independence    Rehab Potential Good      Patient will benefit from skilled therapeutic intervention in order to improve the following deficits and impairments:  Balance deficits; unsteady gait  Visit Diagnosis: Other abnormalities of gait and mobility  Unsteadiness on feet       G-Codes - 07-25-2016 1118    Functional Assessment Tool Used FSST 20 seconds   Functional Limitation Mobility: Walking and moving around   Mobility: Walking and Moving Around Current Status [phone]) At least 40 percent but less than 60 percent impaired, limited or restricted   Mobility: Walking and Moving Around Goal Status [phone]) At least 1 percent but less than 20 percent impaired, limited or restricted      Problem List Patient Active Problem List   Diagnosis Date Noted  . Bleeding duodenal ulcer   . Hypokalemia   . Lactic acidosis   . Duodenal ulcer hemorrhage 10/31/2015  . Urinary retention 10/30/2015  . Acute blood loss anemia 10/29/2015  . Hypotension 10/29/2015  . Syncope and collapse 10/29/2015  . Traumatic brain injury with depressed frontal skull fracture ([place]) 09/07/2012  .  Cognitive deficit as late effect of traumatic brain injury ([place]) 09/07/2012  . Episodic dyscontrol syndrome 09/07/2012    Rosaura Carpenter D PT DPT 2016/07/25, 11:19 AM  [place]  [place] [address] [place], [place], [address] Phone: [phone]   Fax:  [phone]  Name: Kayvon Mo MRN: [id number] Date of Birth: Jan 01, 1966

## 2016-07-04 ENCOUNTER — Ambulatory Visit: Payer: Medicare Other | Admitting: Rehabilitation

## 2016-07-05 ENCOUNTER — Ambulatory Visit: Payer: Medicare Other | Admitting: Physical Therapy

## 2016-07-05 DIAGNOSIS — R2689 Other abnormalities of gait and mobility: Secondary | ICD-10-CM

## 2016-07-05 DIAGNOSIS — R2681 Unsteadiness on feet: Secondary | ICD-10-CM | POA: Diagnosis not present

## 2016-07-05 NOTE — Therapy (Signed)
[facility] [address] [place], [place], [address] Phone: [phone]   Fax:  [phone]  Physical Therapy Treatment  Patient Details  Name: Zachary Thornton MRN: [id number] Date of Birth: 06-Apr-1966 Referring Provider: Joylene Draft  Encounter Date: 07/05/2016      PT End of Session - 07/05/16 1113    Visit Number 11   Number of Visits 17   PT Start Time 1025   PT Stop Time 1110   PT Time Calculation (min) 45 min   Equipment Utilized During Treatment Gait belt   Activity Tolerance Patient tolerated treatment well   Behavior During Therapy [facility] for tasks assessed/performed      Past Medical History  Diagnosis Date  . TBI (traumatic brain injury) ([place])   . Back injury   . Dementia with behavioral disturbance   . Duodenal ulcer hemorrhage   . GI bleed   . Blood loss anemia     Past Surgical History  Procedure Laterality Date  . Csf shunt    . Craniotomy      Post TBI craniotomy and plate insertion  . Esophagogastroduodenoscopy (egd) with propofol N/A 10/30/2015    Procedure: ESOPHAGOGASTRODUODENOSCOPY (EGD) WITH PROPOFOL;  Surgeon: Milus Banister, MD;  Location: [facility];  Service: Endoscopy;  Laterality: N/A;  . Esophagogastroduodenoscopy N/A 11/01/2015    Procedure: ESOPHAGOGASTRODUODENOSCOPY (EGD);  Surgeon: Manus Gunning, MD;  Location: [facility];  Service: Gastroenterology;  Laterality: N/A;    There were no vitals filed for this visit.      Subjective Assessment - 07/05/16 1028    Subjective No falls; pt sait he's a little sore but no problems ; ready to participate   Patient is accompained by: Family member             Therapeutic Exercise Seated on 65 cm ball;  Holding 6.6 lb ball;  Reaching to imaginary clock 12-1-2 and so on-- reaching to edge of balance and recovering;  Progressed bringing feet closer together  Stand to prone and back up  X 10 reps  Lying on mat on  floor  Ball toss with his sister; she threw the ball; progressed from two hand to one hand; and from feet apart to together to standing on foam cushion; PT provided CGA to Min assist with loss of balance  Hurdle steps side to side and between each hurdle he did toe touch on 8" cone;  He got 'points' if he didn't knock if over;  Scored 6/10  3 bouts   amb 2 laps around gym; with ques to walk in tandem; stepping on line made by color change in tile  CGA/ gait belt to min assist   Continued education with sister                     PT Education - 07/05/16 1110    Education provided Yes   Education Details with HEP recomended being creative ; any activity that safely helps him bring his feet together with gait and balance ex's is beneficial    Person(s) Educated Other (comment)   Methods Explanation   Comprehension Verbalized understanding          PT Short Term Goals - 06/20/16 0935    PT SHORT TERM GOAL #1   Title Pt will perform HEP with family supervision, for improved balance and mobility.  TARGET 06/22/16   Baseline met 06/20/16   Time 4   Period [place]  Status Achieved   PT SHORT TERM GOAL #2   Title Pt will improve 5x sit<>stand to less than or equal to 17 seconds for improved transfer efficiency and safety.   Baseline 17.87 secs on 06/20/16   Time 4   Period Weeks   Status Achieved   PT SHORT TERM GOAL #3   Title Pt will improve TUG score to less than or equal to 13.5 seconds for decreased fall risk.   Baseline 15.80 secs on 06/20/16   Time 4   Period Weeks   Status Partially Met   PT SHORT TERM GOAL #4   Title Pt will imrpove Berg Balance score to at least 40/56 for decreased fall risk.   Baseline 41/56 BERG on 06/20/16   Time 4   Period Weeks   Status Achieved           PT Long Term Goals - 05/24/16 1243    PT LONG TERM GOAL #1   Title Pt/family will verbalize understanding of fall prevention within home environment.  TARGET 07/23/16   Time 8    Period Weeks   Status New   PT LONG TERM GOAL #2   Title Pt will improve Berg Balance score to at least 45/56 for decreased fall risk.   Time 8   Period Weeks   Status New   PT LONG TERM GOAL #3   Title Pt will improve Dynamic Gait Index score to at least 17/24 for decreased fall risk.   Time 8   Period Weeks   Status New   PT LONG TERM GOAL #4   Title Pt will verbalize plans for ongoing community fitness upon D/C from PT.   Time 8   Period Weeks   Status New   PT LONG TERM GOAL #5   Title Pt will ambulate at least 1000 ft indoor and outdoor surfaces without loss of balance, for improved community and outdoor mobility.   Time 8   Period Weeks   Status New               Plan - 07/05/16 1114    Clinical Impression Statement Pt tolerating ther ex well; he accepts challenges;  continued focus on family education and progressing dynamic balance ex's will cont to be beneficial    PT Next Visit Plan progress dynamic balance ex's as tolerated      Patient will benefit from skilled therapeutic intervention in order to improve the following deficits and impairments:     Visit Diagnosis: Other abnormalities of gait and mobility  Unsteadiness on feet     Problem List Patient Active Problem List   Diagnosis Date Noted  . Bleeding duodenal ulcer   . Hypokalemia   . Lactic acidosis   . Duodenal ulcer hemorrhage 10/31/2015  . Urinary retention 10/30/2015  . Acute blood loss anemia 10/29/2015  . Hypotension 10/29/2015  . Syncope and collapse 10/29/2015  . Traumatic brain injury with depressed frontal skull fracture ([place]) 09/07/2012  . Cognitive deficit as late effect of traumatic brain injury ([place]) 09/07/2012  . Episodic dyscontrol syndrome 09/07/2012    Rosaura Carpenter D PT DPT  07/05/2016, 11:15 AM  [facility] [address] [place], [place], [address] Phone: [phone]   Fax:  [phone]  Name:  Suede Greenawalt MRN: [id number] Date of Birth: 12-05-1966

## 2016-07-08 ENCOUNTER — Ambulatory Visit: Payer: Medicare Other | Admitting: Rehabilitation

## 2016-07-10 ENCOUNTER — Ambulatory Visit: Payer: Medicare Other | Admitting: Physical Therapy

## 2016-07-10 DIAGNOSIS — R2681 Unsteadiness on feet: Secondary | ICD-10-CM

## 2016-07-10 DIAGNOSIS — R2689 Other abnormalities of gait and mobility: Secondary | ICD-10-CM | POA: Diagnosis not present

## 2016-07-10 NOTE — Therapy (Signed)
[facility] [address] [place], [place], [address] Phone: [phone]   Fax:  [phone]  Physical Therapy Treatment  Patient Details  Name: Zachary Thornton MRN: [id number] Date of Birth: Apr 25, 1966 Referring Provider: Joylene Draft  Encounter Date: 07/10/2016      PT End of Session - 07/10/16 1115    Visit Number 12   Number of Visits 17   PT Start Time 4497   PT Stop Time 1115   PT Time Calculation (min) 45 min      Past Medical History  Diagnosis Date  . TBI (traumatic brain injury) ([place])   . Back injury   . Dementia with behavioral disturbance   . Duodenal ulcer hemorrhage   . GI bleed   . Blood loss anemia     Past Surgical History  Procedure Laterality Date  . Csf shunt    . Craniotomy      Post TBI craniotomy and plate insertion  . Esophagogastroduodenoscopy (egd) with propofol N/A 10/30/2015    Procedure: ESOPHAGOGASTRODUODENOSCOPY (EGD) WITH PROPOFOL;  Surgeon: Milus Banister, MD;  [facility];  Service: Endoscopy;  Laterality: N/A;  . Esophagogastroduodenoscopy N/A 11/01/2015    Procedure: ESOPHAGOGASTRODUODENOSCOPY (EGD);  Surgeon: Manus Gunning, MD;  Location: [facility];  Service: Gastroenterology;  Laterality: N/A;    There were no vitals filed for this visit.      Subjective Assessment - 07/10/16 1030    Subjective No changes to report; no falls ;  mother was present ;when I asked about HEP she said they are real busy he goes to the grocery store often and walks 1/4 mile to mailbox; but not doing specific therex; she stated she did seem to see an improvement in his balance/mvt overall    Patient is accompained by: Family member            Therapeutic exercise  Stand to floor transfer  X 10 with sba;  Stand to prone and back up   Instructed pt through 7  LSVT big ex's ;  Multi-directional repetitive mvts; in standing  X 6 ex's  And on in sitting; focus on posterior  step reflex and reaching outside base of support                        PT Short Term Goals - 06/20/16 0935    PT SHORT TERM GOAL #1   Title Pt will perform HEP with family supervision, for improved balance and mobility.  TARGET 06/22/16   Baseline met 06/20/16   Time 4   Period Weeks   Status Achieved   PT SHORT TERM GOAL #2   Title Pt will improve 5x sit<>stand to less than or equal to 17 seconds for improved transfer efficiency and safety.   Baseline 17.87 secs on 06/20/16   Time 4   Period Weeks   Status Achieved   PT SHORT TERM GOAL #3   Title Pt will improve TUG score to less than or equal to 13.5 seconds for decreased fall risk.   Baseline 15.80 secs on 06/20/16   Time 4   Period Weeks   Status Partially Met   PT SHORT TERM GOAL #4   Title Pt will imrpove Berg Balance score to at least 40/56 for decreased fall risk.   Baseline 41/56 BERG on 06/20/16   Time 4   Period Weeks   Status Achieved  PT Long Term Goals - 05/24/16 1243    PT LONG TERM GOAL #1   Title Pt/family will verbalize understanding of fall prevention within home environment.  TARGET 07/23/16   Time 8   Period Weeks   Status New   PT LONG TERM GOAL #2   Title Pt will improve Berg Balance score to at least 45/56 for decreased fall risk.   Time 8   Period Weeks   Status New   PT LONG TERM GOAL #3   Title Pt will improve Dynamic Gait Index score to at least 17/24 for decreased fall risk.   Time 8   Period Weeks   Status New   PT LONG TERM GOAL #4   Title Pt will verbalize plans for ongoing community fitness upon D/C from PT.   Time 8   Period Weeks   Status New   PT LONG TERM GOAL #5   Title Pt will ambulate at least 1000 ft indoor and outdoor surfaces without loss of balance, for improved community and outdoor mobility.   Time 8   Period Weeks   Status New               Plan - 07/10/16 1115    Clinical Impression Statement Pt continues to present with a wide  base of support; his posterior step reflex and balance reactions are poor; he does have ankle and hip strategies intact;  he will continue to benefit from skilled PT to furher develop HEP and improve dynamaic balance reactions   PT Next Visit Plan review the LSVT HEP we sent home and progress as tolerated      Patient will benefit from skilled therapeutic intervention in order to improve the following deficits and impairments:     Visit Diagnosis: Other abnormalities of gait and mobility  Unsteadiness on feet     Problem List Patient Active Problem List   Diagnosis Date Noted  . Bleeding duodenal ulcer   . Hypokalemia   . Lactic acidosis   . Duodenal ulcer hemorrhage 10/31/2015  . Urinary retention 10/30/2015  . Acute blood loss anemia 10/29/2015  . Hypotension 10/29/2015  . Syncope and collapse 10/29/2015  . Traumatic brain injury with depressed frontal skull fracture ([place]) 09/07/2012  . Cognitive deficit as late effect of traumatic brain injury ([place]) 09/07/2012  . Episodic dyscontrol syndrome 09/07/2012    Rosaura Carpenter D PT DPT  07/10/2016, 11:19 AM  [facility] [address] [place], [place], [address] Phone: [phone]   Fax:  [phone]  Name: Zachary Thornton MRN: [id number] Date of Birth: [birth year]

## 2016-07-11 ENCOUNTER — Ambulatory Visit: Payer: Medicare Other | Admitting: Rehabilitation

## 2016-07-12 ENCOUNTER — Ambulatory Visit: Payer: Medicare Other | Admitting: Physical Therapy

## 2016-07-12 DIAGNOSIS — R2689 Other abnormalities of gait and mobility: Secondary | ICD-10-CM | POA: Diagnosis not present

## 2016-07-12 DIAGNOSIS — R2681 Unsteadiness on feet: Secondary | ICD-10-CM

## 2016-07-12 NOTE — Therapy (Signed)
[facility] [address] [place], [place], [address] Phone: [phone]   Fax:  [phone]  Physical Therapy Treatment  Patient Details  Name: Zachary Thornton MRN: [id number] Date of Birth: 1966-01-11 Referring Provider: Joylene Draft  Encounter Date: 07/12/2016      PT End of Session - 07/12/16 1117    Visit Number 13   Number of Visits 17   PT Start Time 1020   PT Stop Time 1105   PT Time Calculation (min) 45 min   Activity Tolerance Patient tolerated treatment well      Past Medical History  Diagnosis Date  . TBI (traumatic brain injury) ([place])   . Back injury   . Dementia with behavioral disturbance   . Duodenal ulcer hemorrhage   . GI bleed   . Blood loss anemia     Past Surgical History  Procedure Laterality Date  . Csf shunt    . Craniotomy      Post TBI craniotomy and plate insertion  . Esophagogastroduodenoscopy (egd) with propofol N/A 10/30/2015    Procedure: ESOPHAGOGASTRODUODENOSCOPY (EGD) WITH PROPOFOL;  Surgeon: Milus Banister, MD;  [facility];  Service: Endoscopy;  Laterality: N/A;  . Esophagogastroduodenoscopy N/A 11/01/2015    Procedure: ESOPHAGOGASTRODUODENOSCOPY (EGD);  Surgeon: Manus Gunning, MD;  Location: [facility];  Service: Gastroenterology;  Laterality: N/A;    There were no vitals filed for this visit.      Subjective Assessment - 07/12/16 1035    Subjective Pt and sister stated he has been working on the [place] I sent home on Wednesday;  encouraged;  sister stated she may help him go to pool at her home for ex's    Currently in Pain? No/denies           Therapeutic exercise  SCI fit x 20 min random course  Level 15 Stand to prone x 10 reps;  Quadruped  Alt arm to knee x 10  On knees; plank on ball - roll outs and ball circles  x20  Plank with posterior pelvic tilt  2 x 30 seconds                      PT Education - 07/12/16 1117     Education provided No          PT Short Term Goals - 06/20/16 0935    PT SHORT TERM GOAL #1   Title Pt will perform HEP with family supervision, for improved balance and mobility.  TARGET 06/22/16   Baseline met 06/20/16   Time 4   Period Weeks   Status Achieved   PT SHORT TERM GOAL #2   Title Pt will improve 5x sit<>stand to less than or equal to 17 seconds for improved transfer efficiency and safety.   Baseline 17.87 secs on 06/20/16   Time 4   Period Weeks   Status Achieved   PT SHORT TERM GOAL #3   Title Pt will improve TUG score to less than or equal to 13.5 seconds for decreased fall risk.   Baseline 15.80 secs on 06/20/16   Time 4   Period Weeks   Status Partially Met   PT SHORT TERM GOAL #4   Title Pt will imrpove Berg Balance score to at least 40/56 for decreased fall risk.   Baseline 41/56 BERG on 06/20/16   Time 4   Period Weeks   Status Achieved  PT Long Term Goals - 05/24/16 1243    PT LONG TERM GOAL #1   Title Pt/family will verbalize understanding of fall prevention within home environment.  TARGET 07/23/16   Time 8   Period Weeks   Status New   PT LONG TERM GOAL #2   Title Pt will improve Berg Balance score to at least 45/56 for decreased fall risk.   Time 8   Period Weeks   Status New   PT LONG TERM GOAL #3   Title Pt will improve Dynamic Gait Index score to at least 17/24 for decreased fall risk.   Time 8   Period Weeks   Status New   PT LONG TERM GOAL #4   Title Pt will verbalize plans for ongoing community fitness upon D/C from PT.   Time 8   Period Weeks   Status New   PT LONG TERM GOAL #5   Title Pt will ambulate at least 1000 ft indoor and outdoor surfaces without loss of balance, for improved community and outdoor mobility.   Time 8   Period Weeks   Status New               Plan - 07/12/16 1118    Clinical Impression Statement [name] family commented he is moving much better; more confident and functional ;  pt stated  he did his HEP since last visit-hes seeing the value in this;  he was able tolerate the 20 min on sci fit and all the core work we did today w/o excessive fatigue or pain ; he was tired afterward.    PT Next Visit Plan reveiw LSVT HEP and what is working for HEP      Patient will benefit from skilled therapeutic intervention in order to improve the following deficits and impairments:     Visit Diagnosis: Other abnormalities of gait and mobility  Unsteadiness on feet     Problem List Patient Active Problem List   Diagnosis Date Noted  . Bleeding duodenal ulcer   . Hypokalemia   . Lactic acidosis   . Duodenal ulcer hemorrhage 10/31/2015  . Urinary retention 10/30/2015  . Acute blood loss anemia 10/29/2015  . Hypotension 10/29/2015  . Syncope and collapse 10/29/2015  . Traumatic brain injury with depressed frontal skull fracture ([place]) 09/07/2012  . Cognitive deficit as late effect of traumatic brain injury ([place]) 09/07/2012  . Episodic dyscontrol syndrome 09/07/2012    Rosaura Carpenter D PT DPT  07/12/2016, 11:26 AM  [facility] [address] [place], [place], [address] Phone: [phone]   Fax:  [phone]  Name: Rocklin Soderquist MRN: [id number] Date of Birth: [birth year]

## 2016-07-15 ENCOUNTER — Ambulatory Visit: Payer: Medicare Other | Admitting: Rehabilitation

## 2016-07-17 ENCOUNTER — Ambulatory Visit: Payer: Medicare Other | Admitting: Physical Therapy

## 2016-07-17 DIAGNOSIS — R2689 Other abnormalities of gait and mobility: Secondary | ICD-10-CM | POA: Diagnosis not present

## 2016-07-17 DIAGNOSIS — R2681 Unsteadiness on feet: Secondary | ICD-10-CM

## 2016-07-17 NOTE — Patient Instructions (Addendum)
Upper / Lower Extremity Extension (All-Fours)    Tighten stomach and raise right leg and opposite arm. Keep trunk rigid. Repeat __10 __ times per set.   http://orth.exer.us/1118   Copyright  VHI. All rights reserved.  Bridge    Lift hips, keeping pelvis level. Do 10___ times, _2__ times per day.  Also roll the ball back toward your bottom 10 times   http://ss.exer.us/364   Copyright  VHI. All rights reserved.

## 2016-07-17 NOTE — Therapy (Signed)
[facility] [address] [place], [place], [address] Phone: [phone]   Fax:  [phone]  Physical Therapy Treatment  Patient Details  Name: Zachary Thornton MRN: [id number] Date of Birth: 06-28-1966 Referring Provider: Joylene Draft  Encounter Date: 07/17/2016      PT End of Session - 07/17/16 1113    Visit Number 14   Number of Visits 17   PT Start Time 1020   PT Stop Time 1105   PT Time Calculation (min) 45 min   Activity Tolerance Patient tolerated treatment well   Behavior During Therapy [facility] for tasks assessed/performed      Past Medical History:  Diagnosis Date  . Back injury   . Blood loss anemia   . Dementia with behavioral disturbance   . Duodenal ulcer hemorrhage   . GI bleed   . TBI (traumatic brain injury) [facility])     Past Surgical History:  Procedure Laterality Date  . CRANIOTOMY     Post TBI craniotomy and plate insertion  . CSF SHUNT    . ESOPHAGOGASTRODUODENOSCOPY N/A 11/01/2015   Procedure: ESOPHAGOGASTRODUODENOSCOPY (EGD);  Surgeon: Manus Gunning, MD;  Location: [facility];  Service: Gastroenterology;  Laterality: N/A;  . ESOPHAGOGASTRODUODENOSCOPY (EGD) WITH PROPOFOL N/A 10/30/2015   Procedure: ESOPHAGOGASTRODUODENOSCOPY (EGD) WITH PROPOFOL;  Surgeon: Milus Banister, MD;  [facility];  Service: Endoscopy;  Laterality: N/A;    There were no vitals filed for this visit.      Subjective Assessment - 07/17/16 1111    Subjective Pt came with sister; she stated the ex's have been good; his confidence is better; no falls; he took out trash very long drive way;  Halbert stated he is feeling good and ready to work out    Currently in Pain? No/denies            Therapeutic exercise  Stand to prone 2 x 10  Quadruped;  Elbow to knee  2x 10                    Alt arm leg lift 2 x 10 Supine;  Heels on stability ball; bridge with 3 second hold and hamstring curls  Each 2 x 10    SCI FIT; random setting  X 20 min                      PT Education - 07/17/16 1112    Education provided Yes   Education Details developing HEP; also spoke with sister about creating opportunities that help [place] reach goals to foster feeling of independence and accomplishment; as well as tech ot help with redirecting if/when he gets upset          PT Short Term Goals - 06/20/16 0935      PT SHORT TERM GOAL #1   Title Pt will perform HEP with family supervision, for improved balance and mobility.  TARGET 06/22/16   Baseline met 06/20/16   Time 4   Period Weeks   Status Achieved     PT SHORT TERM GOAL #2   Title Pt will improve 5x sit<>stand to less than or equal to 17 seconds for improved transfer efficiency and safety.   Baseline 17.87 secs on 06/20/16   Time 4   Period Weeks   Status Achieved     PT SHORT TERM GOAL #3   Title Pt will improve TUG score to less than or equal to 13.5 seconds for  decreased fall risk.   Baseline 15.80 secs on 06/20/16   Time 4   Period Weeks   Status Partially Met     PT SHORT TERM GOAL #4   Title Pt will imrpove Berg Balance score to at least 40/56 for decreased fall risk.   Baseline 41/56 BERG on 06/20/16   Time 4   Period Weeks   Status Achieved           PT Long Term Goals - 05/24/16 1243      PT LONG TERM GOAL #1   Title Pt/family will verbalize understanding of fall prevention within home environment.  TARGET 07/23/16   Time 8   Period Weeks   Status New     PT LONG TERM GOAL #2   Title Pt will improve Berg Balance score to at least 45/56 for decreased fall risk.   Time 8   Period Weeks   Status New     PT LONG TERM GOAL #3   Title Pt will improve Dynamic Gait Index score to at least 17/24 for decreased fall risk.   Time 8   Period Weeks   Status New     PT LONG TERM GOAL #4   Title Pt will verbalize plans for ongoing community fitness upon D/C from PT.   Time 8   Period Weeks   Status New      PT LONG TERM GOAL #5   Title Pt will ambulate at least 1000 ft indoor and outdoor surfaces without loss of balance, for improved community and outdoor mobility.   Time 8   Period Weeks   Status New               Plan - 07/17/16 1114    Clinical Impression Statement HEP developing; should be albe to DC to HEP need to complete hand outs and review   PT Next Visit Plan review HEP and prepare him for DC      Patient will benefit from skilled therapeutic intervention in order to improve the following deficits and impairments:     Visit Diagnosis: Other abnormalities of gait and mobility  Unsteadiness on feet     Problem List Patient Active Problem List   Diagnosis Date Noted  . Bleeding duodenal ulcer   . Hypokalemia   . Lactic acidosis   . Duodenal ulcer hemorrhage 10/31/2015  . Urinary retention 10/30/2015  . Acute blood loss anemia 10/29/2015  . Hypotension 10/29/2015  . Syncope and collapse 10/29/2015  . Traumatic brain injury with depressed frontal skull fracture ([place]) 09/07/2012  . Cognitive deficit as late effect of traumatic brain injury ([place]) 09/07/2012  . Episodic dyscontrol syndrome 09/07/2012    Rosaura Carpenter D PT DPT 07/17/2016, 11:17 AM  [facility] [address] [place], [place], [address] Phone: [phone]   Fax:  [phone]  Name: Demond Shallenberger MRN: [id number] Date of Birth: 1966/01/25

## 2016-07-18 ENCOUNTER — Ambulatory Visit: Payer: Medicare Other | Admitting: Rehabilitation

## 2016-07-19 ENCOUNTER — Ambulatory Visit: Payer: Medicare Other | Admitting: Physical Therapy

## 2016-07-19 DIAGNOSIS — R2681 Unsteadiness on feet: Secondary | ICD-10-CM | POA: Diagnosis not present

## 2016-07-19 DIAGNOSIS — R2689 Other abnormalities of gait and mobility: Secondary | ICD-10-CM | POA: Diagnosis not present

## 2016-07-19 NOTE — Therapy (Signed)
[facility] [address] [place], [place], [address] Phone: [phone]   Fax:  [phone]  Physical Therapy Treatment  Patient Details  Name: Zachary Thornton MRN: [id number] Date of Birth: [birth year] Referring Provider: Joylene Draft  Encounter Date: 07/19/2016      PT End of Session - 07/19/16 1104    Visit Number 15   Number of Visits 17   PT Start Time 1020   PT Stop Time 1105   PT Time Calculation (min) 45 min   Activity Tolerance Patient tolerated treatment well   Behavior During Therapy [facility] for tasks assessed/performed      Past Medical History:  Diagnosis Date  . Back injury   . Blood loss anemia   . Dementia with behavioral disturbance   . Duodenal ulcer hemorrhage   . GI bleed   . TBI (traumatic brain injury) [facility])     Past Surgical History:  Procedure Laterality Date  . CRANIOTOMY     Post TBI craniotomy and plate insertion  . CSF SHUNT    . ESOPHAGOGASTRODUODENOSCOPY N/A 11/01/2015   Procedure: ESOPHAGOGASTRODUODENOSCOPY (EGD);  Surgeon: Manus Gunning, MD;  Location: [facility];  Service: Gastroenterology;  Laterality: N/A;  . ESOPHAGOGASTRODUODENOSCOPY (EGD) WITH PROPOFOL N/A 10/30/2015   Procedure: ESOPHAGOGASTRODUODENOSCOPY (EGD) WITH PROPOFOL;  Surgeon: Milus Banister, MD;  Location: [facility];  Service: Endoscopy;  Laterality: N/A;    There were no vitals filed for this visit.      Subjective Assessment - 07/19/16 1115    Subjective No new complaints/ problem; no falls;  we discussed coming up on re-eval; mother and Angus would like PT to continue if possible ; they feel he is making good benefits and seeing improved function with daily tasks   Currently in Pain? No/denies             Gait training;    Calf stretch off step  5  X 30 seconds; focus on right but did bilaterally;   Videoed pt with mothers phone and used visual feed back to show Masiyah the wide base and right  ER.   Vq's with amb; multi bouts around the 115' track;   Walked behind pt with him holding poles to help with UE arm swing during gait; progressed to taking lunging steps(long as possible ) and slow as possible.    Treadmill ; walking backwards x 6 min  1.2 mph ; SBA ; vq's for correct foot ER--on treadmill he had to have a more narrow base of support  At end of session had him walk and he was able to do two 10' lengths with near normal gait pattern                       PT Short Term Goals - 06/20/16 0935      PT SHORT TERM GOAL #1   Title Pt will perform HEP with family supervision, for improved balance and mobility.  TARGET 06/22/16   Baseline met 06/20/16   Time 4   Period Weeks   Status Achieved     PT SHORT TERM GOAL #2   Title Pt will improve 5x sit<>stand to less than or equal to 17 seconds for improved transfer efficiency and safety.   Baseline 17.87 secs on 06/20/16   Time 4   Period Weeks   Status Achieved     PT SHORT TERM GOAL #3   Title Pt will improve TUG score to  less than or equal to 13.5 seconds for decreased fall risk.   Baseline 15.80 secs on 06/20/16   Time 4   Period Weeks   Status Partially Met     PT SHORT TERM GOAL #4   Title Pt will imrpove Berg Balance score to at least 40/56 for decreased fall risk.   Baseline 41/56 BERG on 06/20/16   Time 4   Period Weeks   Status Achieved           PT Long Term Goals - 05/24/16 1243      PT LONG TERM GOAL #1   Title Pt/family will verbalize understanding of fall prevention within home environment.  TARGET 07/23/16   Time 8   Period Weeks   Status New     PT LONG TERM GOAL #2   Title Pt will improve Berg Balance score to at least 45/56 for decreased fall risk.   Time 8   Period Weeks   Status New     PT LONG TERM GOAL #3   Title Pt will improve Dynamic Gait Index score to at least 17/24 for decreased fall risk.   Time 8   Period Weeks   Status New     PT LONG TERM GOAL #4    Title Pt will verbalize plans for ongoing community fitness upon D/C from PT.   Time 8   Period Weeks   Status New     PT LONG TERM GOAL #5   Title Pt will ambulate at least 1000 ft indoor and outdoor surfaces without loss of balance, for improved community and outdoor mobility.   Time 8   Period Weeks   Status New               Plan - 07/19/16 1116    Clinical Impression Statement Pt Gait assessment; he has limited dorsi flexion on right ankle; amb with ER right LE and wide base of support; he also hikes up shoulder on right and has very limited arm swing; with verbal ques he was able to walk about 10' with near normal gait pattern then he reverted back to his typical pattern ; utilized several techniques to break up his gait pattern- it is reasonalble to continue to treat to correct the gait to futher lower his fall risk and develop a normalized pattern   Rehab Potential Good   PT Next Visit Plan reassess with consideration of continuing vs. dc ; will confernce with evaluating PT ; also need to go over HEP   Consulted and Agree with Plan of Care Patient;Family member/caregiver   Family Member Consulted mother Zachary Thornton      Patient will benefit from skilled therapeutic intervention in order to improve the following deficits and impairments:  Abnormal gait, Decreased balance, Decreased cognition, Decreased coordination, Decreased range of motion, Improper body mechanics  Visit Diagnosis: Other abnormalities of gait and mobility  Unsteadiness on feet     Problem List Patient Active Problem List   Diagnosis Date Noted  . Bleeding duodenal ulcer   . Hypokalemia   . Lactic acidosis   . Duodenal ulcer hemorrhage 10/31/2015  . Urinary retention 10/30/2015  . Acute blood loss anemia 10/29/2015  . Hypotension 10/29/2015  . Syncope and collapse 10/29/2015  . Traumatic brain injury with depressed frontal skull fracture ([place]) 09/07/2012  . Cognitive deficit as late effect of  traumatic brain injury ([place]) 09/07/2012  . Episodic dyscontrol syndrome 09/07/2012    Rosaura Carpenter D  PT  DPT  07/19/2016, 11:21 AM  [facility] [address] [place], [place], [address] Phone: [phone]   Fax:  [phone]  Name: Taron Mondor MRN: [id number] Date of Birth: 1966-02-15

## 2016-07-19 NOTE — Patient Instructions (Addendum)
Gastroc Stretch    Stand with right foot back, leg straight, forward leg bent. Keeping heel on floor, turned slightly out, lean into wall until stretch is felt in calf. Hold _30___ seconds. Repeat __3__ times per set.  Do 3____ sessions per day.  http://orth.exer.us/26   Copyright  VHI. All rights reserved.  Plantar Fascia Stretch    In a safe place ;  Standing with only ball of left foot on stair, push heel down until stretch is felt through arch of foot. Hold __30__ seconds. Relax. Repeat __3_ times per set. Do __3__ sessions per day.  http://orth.exer.us/22   Copyright  VHI. All rights reserved.

## 2016-07-29 ENCOUNTER — Ambulatory Visit: Payer: Medicare Other | Admitting: Rehabilitation

## 2016-07-29 ENCOUNTER — Other Ambulatory Visit: Payer: Self-pay | Admitting: Gastroenterology

## 2016-08-02 ENCOUNTER — Ambulatory Visit: Payer: Medicare Other | Attending: Internal Medicine | Admitting: Rehabilitation

## 2016-08-02 DIAGNOSIS — R2689 Other abnormalities of gait and mobility: Secondary | ICD-10-CM | POA: Insufficient documentation

## 2016-08-02 DIAGNOSIS — R2681 Unsteadiness on feet: Secondary | ICD-10-CM | POA: Insufficient documentation

## 2016-08-02 NOTE — Patient Instructions (Signed)
Fall Prevention in the Home  Falls can cause injuries and can affect people from all age groups. There are many simple things that you can do to make your home safe and to help prevent falls. WHAT CAN I DO ON THE OUTSIDE OF MY HOME?  Regularly repair the edges of walkways and driveways and fix any cracks.  Remove high doorway thresholds.  Trim any shrubbery on the main path into your home.  Use bright outdoor lighting.  Clear walkways of debris and clutter, including tools and rocks.  Regularly check that handrails are securely fastened and in good repair. Both sides of any steps should have handrails.  Install guardrails along the edges of any raised decks or porches.  Have leaves, snow, and ice cleared regularly.  Use sand or salt on walkways during winter months.  In the garage, clean up any spills right away, including grease or oil spills. WHAT CAN I DO IN THE BATHROOM?  Use night lights.  Install grab bars by the toilet and in the tub and shower. Do not use towel bars as grab bars.  Use non-skid mats or decals on the floor of the tub or shower.  If you need to sit down while you are in the shower, use a plastic, non-slip stool..  Keep the floor dry. Immediately clean up any water that spills on the floor.  Remove soap buildup in the tub or shower on a regular basis.  Attach bath mats securely with double-sided non-slip rug tape.  Remove throw rugs and other tripping hazards from the floor. WHAT CAN I DO IN THE BEDROOM?  Use night lights.  Make sure that a bedside light is easy to reach.  Do not use oversized bedding that drapes onto the floor.  Have a firm chair that has side arms to use for getting dressed.  Remove throw rugs and other tripping hazards from the floor. WHAT CAN I DO IN THE KITCHEN?   Clean up any spills right away.  Avoid walking on wet floors.  Place frequently used items in easy-to-reach places.  If you need to reach for something  above you, use a sturdy step stool that has a grab bar.  Keep electrical cables out of the way.  Do not use floor polish or wax that makes floors slippery. If you have to use wax, make sure that it is non-skid floor wax.  Remove throw rugs and other tripping hazards from the floor. WHAT CAN I DO IN THE STAIRWAYS?  Do not leave any items on the stairs.  Make sure that there are handrails on both sides of the stairs. Fix handrails that are broken or loose. Make sure that handrails are as long as the stairways.  Check any carpeting to make sure that it is firmly attached to the stairs. Fix any carpet that is loose or worn.  Avoid having throw rugs at the top or bottom of stairways, or secure the rugs with carpet tape to prevent them from moving.  Make sure that you have a light switch at the top of the stairs and the bottom of the stairs. If you do not have them, have them installed. WHAT ARE SOME OTHER FALL PREVENTION TIPS?  Wear closed-toe shoes that fit well and support your feet. Wear shoes that have rubber soles or low heels.  When you use a stepladder, make sure that it is completely opened and that the sides are firmly locked. Have someone hold the ladder while you   are using it. Do not climb a closed stepladder.  Add color or contrast paint or tape to grab bars and handrails in your home. Place contrasting color strips on the first and last steps.  Use mobility aids as needed, such as canes, walkers, scooters, and crutches.  Turn on lights if it is dark. Replace any light bulbs that burn out.  Set up furniture so that there are clear paths. Keep the furniture in the same spot.  Fix any uneven floor surfaces.  Choose a carpet design that does not hide the edge of steps of a stairway.  Be aware of any and all pets.  Review your medicines with your healthcare provider. Some medicines can cause dizziness or changes in blood pressure, which increase your risk of falling. Talk  with your health care provider about other ways that you can decrease your risk of falls. This may include working with a physical therapist or trainer to improve your strength, balance, and endurance.   This information is not intended to replace advice given to you by your health care provider. Make sure you discuss any questions you have with your health care provider.   Document Released: 11/29/2002 Document Revised: 04/25/2015 Document Reviewed: 01/13/2015 Elsevier Interactive Patient Education 2016 Elsevier Inc.  

## 2016-08-03 ENCOUNTER — Encounter: Payer: Self-pay | Admitting: Rehabilitation

## 2016-08-03 NOTE — Therapy (Signed)
[facility] [address] [place], [place], [address] Phone: [phone]   Fax:  [phone]  Physical Therapy Treatment  Patient Details  Name: Zachary Thornton MRN: [id number] Date of Birth: 30-Oct-1966 Referring Provider: Joylene Draft  Encounter Date: 08/02/2016      PT End of Session - 08/03/16 0906    Visit Number 16   Number of Visits 25  updated POC   Date for PT Re-Evaluation 09/01/16  per updated POC   Authorization Type Medicare-Gcode every 10th visit   PT Start Time 1450   PT Stop Time 1532   PT Time Calculation (min) 42 min   Activity Tolerance Patient tolerated treatment well   Behavior During Therapy [facility] for tasks assessed/performed      Past Medical History:  Diagnosis Date  . Back injury   . Blood loss anemia   . Dementia with behavioral disturbance   . Duodenal ulcer hemorrhage   . GI bleed   . TBI (traumatic brain injury) [facility])     Past Surgical History:  Procedure Laterality Date  . CRANIOTOMY     Post TBI craniotomy and plate insertion  . CSF SHUNT    . ESOPHAGOGASTRODUODENOSCOPY N/A 11/01/2015   Procedure: ESOPHAGOGASTRODUODENOSCOPY (EGD);  Surgeon: Manus Gunning, MD;  Location: [facility];  Service: Gastroenterology;  Laterality: N/A;  . ESOPHAGOGASTRODUODENOSCOPY (EGD) WITH PROPOFOL N/A 10/30/2015   Procedure: ESOPHAGOGASTRODUODENOSCOPY (EGD) WITH PROPOFOL;  Surgeon: Milus Banister, MD;  Location: [facility];  Service: Endoscopy;  Laterality: N/A;    There were no vitals filed for this visit.      Subjective Assessment - 08/03/16 0904    Subjective No changes since last visit, no falls.     Patient is accompained by: Family member   Patient Stated Goals [name] goals are to improve balance and prevent future falls.   Currently in Pain? No/denies                         [facility] Adult PT Treatment/Exercise - 08/02/16 1506      Standardized Balance Assessment    Standardized Balance Assessment Berg Balance Test;Dynamic Gait Index     Berg Balance Test   Sit to Stand Able to stand without using hands and stabilize independently   Standing Unsupported Able to stand safely 2 minutes   Sitting with Back Unsupported but Feet Supported on Floor or Stool Able to sit safely and securely 2 minutes   Stand to Sit Sits safely with minimal use of hands   Transfers Able to transfer safely, minor use of hands   Standing Unsupported with Eyes Closed Able to stand 10 seconds safely   Standing Ubsupported with Feet Together Able to place feet together independently and stand for 1 minute with supervision   From Standing, Reach Forward with Outstretched Arm Can reach confidently >25 cm (10")   From Standing Position, Pick up Object from Floor Able to pick up shoe safely and easily   From Standing Position, Turn to Look Behind Over each Shoulder Looks behind from both sides and weight shifts well   Turn 360 Degrees Able to turn 360 degrees safely but slowly   Standing Unsupported, Alternately Place Feet on Step/Stool Able to complete >2 steps/needs minimal assist   Standing Unsupported, One Foot in Front Able to plae foot ahead of the other independently and hold 30 seconds   Standing on One Leg Tries to lift leg/unable to hold 3  seconds but remains standing independently   Total Score 46     Dynamic Gait Index   Level Surface Mild Impairment   Change in Gait Speed Mild Impairment   Gait with Horizontal Head Turns Mild Impairment   Gait with Vertical Head Turns Mild Impairment   Gait and Pivot Turn Normal   Step Over Obstacle Mild Impairment   Step Around Obstacles Mild Impairment   Steps Mild Impairment   Total Score 17      NMR:  Performed BERG balance test and DGI during session to address LTG's.  Pt has met both goals, see full details above.    Self Care:   Had lengthy discussion with pt and mother regarding fall prevention strategies in the home,  thinking about community fitness once therapy ends (PT to research where he could go), and also education on Brain Injury support group and how it could be beneficial for both [place] and mother.  Both verbalized understanding.            PT Education - 08/03/16 0904    Education provided Yes   Education Details fall prevention strategies, provided education and information on brain injury support group.     Person(s) Educated Patient;Parent(s)   Methods Explanation   Comprehension Verbalized understanding          PT Short Term Goals - 06/20/16 0935      PT SHORT TERM GOAL #1   Title Pt will perform HEP with family supervision, for improved balance and mobility.  TARGET 06/22/16   Baseline met 06/20/16   Time 4   Period Weeks   Status Achieved     PT SHORT TERM GOAL #2   Title Pt will improve 5x sit<>stand to less than or equal to 17 seconds for improved transfer efficiency and safety.   Baseline 17.87 secs on 06/20/16   Time 4   Period Weeks   Status Achieved     PT SHORT TERM GOAL #3   Title Pt will improve TUG score to less than or equal to 13.5 seconds for decreased fall risk.   Baseline 15.80 secs on 06/20/16   Time 4   Period Weeks   Status Partially Met     PT SHORT TERM GOAL #4   Title Pt will imrpove Berg Balance score to at least 40/56 for decreased fall risk.   Baseline 41/56 BERG on 06/20/16   Time 4   Period Weeks   Status Achieved           PT Long Term Goals - 08/02/16 1456      PT LONG TERM GOAL #1   Title Pt/family will verbalize understanding of fall prevention within home environment.  Modified TARGET 08/30/16   Baseline needs min to mod cues to recall fall prevention strategies.     Time 4   Period Weeks   Status Partially Met  Goal to be ongoing at next visit     PT LONG TERM GOAL #2   Title Pt will improve Berg Balance score to at least 50/56 for decreased fall risk.   Baseline 46/56 08/02/16-updated goal due to continued POC   Time 4    Period Weeks   Status --     PT LONG TERM GOAL #3   Title Pt will improve Dynamic Gait Index score to at least 20/24 for decreased fall risk.   Baseline 17/24 on 08/02/16-updated goal due to continued POC   Time 4   Period  Weeks   Status Revised     PT LONG TERM GOAL #4   Title Pt will verbalize plans for ongoing community fitness upon D/C from PT.   Baseline PT looking into resources for pt/mother   Time 4   Period Weeks   Status Not Met  make goal ongoing on next visit     PT LONG TERM GOAL #5   Title Pt will ambulate at least 1000 ft indoor and outdoor surfaces without loss of balance, for improved community and outdoor mobility.   Baseline unable to assess due to time constraint.     Time 4   Period Weeks   Status On-going  goal to be continued     Additional Long Term Goals   Additional Long Term Goals Yes     PT LONG TERM GOAL #6   Title Pt will ambulate 200' at S level with min cues only to indicate improved gait mechanics demonstrated less RLE external rotation and narrowed BOS.   Time 4   Period Weeks   Status New               Plan - 08/03/16 0906    Clinical Impression Statement Pt has met 2/5 LTG's and partially met 3rd goal for fall prevention strategies.  Feel that he continues to make improvements in balance and gait mechanics, therefore will add 4 more weeks to pts current POC.  See updated goals.     Rehab Potential Good   PT Frequency 2x / week   PT Duration 4 weeks  updated POC   PT Treatment/Interventions ADLs/Self Care Home Management;Therapeutic exercise;Therapeutic activities;Functional mobility training;Gait training;DME Instruction;Balance training;Neuromuscular re-education;Patient/family education   PT Next Visit Plan review HEP-still appropriate? gait training, can we find some resources for community fitness for him?   Consulted and Agree with Plan of Care Patient;Family member/caregiver   Family Member Consulted mother [name]       Patient will benefit from skilled therapeutic intervention in order to improve the following deficits and impairments:  Abnormal gait, Decreased balance, Decreased cognition, Decreased coordination, Decreased range of motion, Improper body mechanics  Visit Diagnosis: Other abnormalities of gait and mobility - Plan: PT plan of care cert/re-cert  Unsteadiness on feet - Plan: PT plan of care cert/re-cert     Problem List Patient Active Problem List   Diagnosis Date Noted  . Bleeding duodenal ulcer   . Hypokalemia   . Lactic acidosis   . Duodenal ulcer hemorrhage 10/31/2015  . Urinary retention 10/30/2015  . Acute blood loss anemia 10/29/2015  . Hypotension 10/29/2015  . Syncope and collapse 10/29/2015  . Traumatic brain injury with depressed frontal skull fracture ([place]) 09/07/2012  . Cognitive deficit as late effect of traumatic brain injury ([place]) 09/07/2012  . Episodic dyscontrol syndrome 09/07/2012    Cameron Sprang, PT, MPT [facility] [address] [place], [place], [address] Phone: [phone]   Fax:  [phone] 08/03/16, 9:21 AM  Name: Zachary Thornton MRN: [id number] Date of Birth: 26-Jan-1966

## 2016-08-07 ENCOUNTER — Ambulatory Visit: Payer: Medicare Other | Admitting: Physical Therapy

## 2016-08-07 DIAGNOSIS — R2681 Unsteadiness on feet: Secondary | ICD-10-CM

## 2016-08-07 DIAGNOSIS — R2689 Other abnormalities of gait and mobility: Secondary | ICD-10-CM | POA: Diagnosis not present

## 2016-08-07 NOTE — Patient Instructions (Addendum)
Gastroc Stretch    Stand with right foot back, leg straight, forward leg bent. Keeping heel on floor, turned slightly out, lean into wall until stretch is felt in calf. Hold ____ seconds. Repeat ____ times per set. Do ____ sets per session. Do ____ sessions per day.  http://orth.exer.us/26   Copyright  VHI. All rights reserved.  Plantar Fascia Stretch    Standing with only ball of left foot on stair, push heel down until stretch is felt through arch of foot. Hold ____ seconds. Relax. Repeat ____ times per set. Do ____ sets per session. Do ____ sessions per day.  http://orth.exer.us/22   Copyright  VHI. All rights reserved.

## 2016-08-07 NOTE — Therapy (Signed)
[facility] [address] [place], [place], [address] Phone: [phone]   Fax:  [phone]  Physical Therapy Treatment  Patient Details  Name: Zachary Thornton MRN: [id number] Date of Birth: 09/15/1966 Referring Provider: Joylene Draft  Encounter Date: 08/07/2016      PT End of Session - 08/07/16 1048    Visit Number 17   Number of Visits 25   PT Start Time 0930   PT Stop Time 1015   PT Time Calculation (min) 45 min   Activity Tolerance Patient tolerated treatment well   Behavior During Therapy [facility] for tasks assessed/performed      Past Medical History:  Diagnosis Date  . Back injury   . Blood loss anemia   . Dementia with behavioral disturbance   . Duodenal ulcer hemorrhage   . GI bleed   . TBI (traumatic brain injury) [facility])     Past Surgical History:  Procedure Laterality Date  . CRANIOTOMY     Post TBI craniotomy and plate insertion  . CSF SHUNT    . ESOPHAGOGASTRODUODENOSCOPY N/A 11/01/2015   Procedure: ESOPHAGOGASTRODUODENOSCOPY (EGD);  Surgeon: Manus Gunning, MD;  Location: [facility];  Service: Gastroenterology;  Laterality: N/A;  . ESOPHAGOGASTRODUODENOSCOPY (EGD) WITH PROPOFOL N/A 10/30/2015   Procedure: ESOPHAGOGASTRODUODENOSCOPY (EGD) WITH PROPOFOL;  Surgeon: Milus Banister, MD;  [facility];  Service: Endoscopy;  Laterality: N/A;    There were no vitals filed for this visit.      Subjective Assessment - 08/07/16 0958    Subjective Doing well; no falls ; has been working on HEP   Currently in Pain? No/denies        Gait training;  Utilized [employer] gait training treadmill; 10 min bout Time on each foot;  Left 49%  Right 51% He was taking short strides but with feedback was able to improve with average of left  54cm  Right 59 cm  Goal was 63-75 cm  Gait training using the mirror for narrower base and correcting right Er and circumduction and relaxing the right shoulder  ;  multi bouts  There ex  Stretching  Calf on  Steps  5 x 30 seconds bilateral;                           PT Education - 08/07/16 1115    Education provided Yes   Education Details calf stretcing;  gait correction ; main focus is narrower base of support and not to swing right leg out to the side    Person(s) Educated Patient;Other (comment)   Methods Explanation;Demonstration;Tactile cues;Other (comment);Handout   Comprehension Verbalized understanding;Returned demonstration          PT Short Term Goals - 06/20/16 0935      PT SHORT TERM GOAL #1   Title Pt will perform HEP with family supervision, for improved balance and mobility.  TARGET 06/22/16   Baseline met 06/20/16   Time 4   Period Weeks   Status Achieved     PT SHORT TERM GOAL #2   Title Pt will improve 5x sit<>stand to less than or equal to 17 seconds for improved transfer efficiency and safety.   Baseline 17.87 secs on 06/20/16   Time 4   Period Weeks   Status Achieved     PT SHORT TERM GOAL #3   Title Pt will improve TUG score to less than or equal to 13.5 seconds for decreased fall risk.  Baseline 15.80 secs on 06/20/16   Time 4   Period Weeks   Status Partially Met     PT SHORT TERM GOAL #4   Title Pt will imrpove Berg Balance score to at least 40/56 for decreased fall risk.   Baseline 41/56 BERG on 06/20/16   Time 4   Period Weeks   Status Achieved           PT Long Term Goals - 08/02/16 1456      PT LONG TERM GOAL #1   Title Pt/family will verbalize understanding of fall prevention within home environment.  Modified TARGET 08/30/16   Baseline needs min to mod cues to recall fall prevention strategies.     Time 4   Period Weeks   Status Partially Met  Goal to be ongoing at next visit     PT LONG TERM GOAL #2   Title Pt will improve Berg Balance score to at least 50/56 for decreased fall risk.   Baseline 46/56 08/02/16-updated goal due to continued POC   Time 4   Period Weeks    Status --     PT LONG TERM GOAL #3   Title Pt will improve Dynamic Gait Index score to at least 20/24 for decreased fall risk.   Baseline 17/24 on 08/02/16-updated goal due to continued POC   Time 4   Period Weeks   Status Revised     PT LONG TERM GOAL #4   Title Pt will verbalize plans for ongoing community fitness upon D/C from PT.   Baseline PT looking into resources for pt/mother   Time 4   Period Weeks   Status Not Met  make goal ongoing on next visit     PT LONG TERM GOAL #5   Title Pt will ambulate at least 1000 ft indoor and outdoor surfaces without loss of balance, for improved community and outdoor mobility.   Baseline unable to assess due to time constraint.     Time 4   Period Weeks   Status On-going  goal to be continued     Additional Long Term Goals   Additional Long Term Goals Yes     PT LONG TERM GOAL #6   Title Pt will ambulate 200' at S level with min cues only to indicate improved gait mechanics demonstrated less RLE external rotation and narrowed BOS.   Time 4   Period Weeks   Status New               Plan - 08/07/16 1117    Clinical Impression Statement With proper feed back and gait training he was able to correct his gait deficits (  right LE circumduction and ER during swing ; wide base of support and pseuodo-high guard with right UE) ; utilzed gait trainer/treadmil; verbal and manual/tactile ques and used his sisters phone to video his gait pattern and show him the deficits via video.  It will take consistent practice to break his current patten but he is demonstrating the cognitivve ability to make the corrections; he also has bilateral calf tightness; gave him a stretching program to do to help improve this which should improve his ER during swing   PT Next Visit Plan review HEP cont with gait trainng; cont to problem solve with family for long term goals of community fitness   Consulted and Agree with Plan of Care Patient;Family  member/caregiver   Family Member Consulted sister      Patient will  benefit from skilled therapeutic intervention in order to improve the following deficits and impairments:  Abnormal gait  Visit Diagnosis: Other abnormalities of gait and mobility  Unsteadiness on feet     Problem List Patient Active Problem List   Diagnosis Date Noted  . Bleeding duodenal ulcer   . Hypokalemia   . Lactic acidosis   . Duodenal ulcer hemorrhage 10/31/2015  . Urinary retention 10/30/2015  . Acute blood loss anemia 10/29/2015  . Hypotension 10/29/2015  . Syncope and collapse 10/29/2015  . Traumatic brain injury with depressed frontal skull fracture ([place]) 09/07/2012  . Cognitive deficit as late effect of traumatic brain injury ([place]) 09/07/2012  . Episodic dyscontrol syndrome 09/07/2012    Rosaura Carpenter D PT DPT  08/07/2016, 11:31 AM  [facility] [address] [place], [place], [address] Phone: [phone]   Fax:  [phone]  Name: Zachary Thornton MRN: [id number] Date of Birth: 1966/06/18

## 2016-08-09 ENCOUNTER — Ambulatory Visit: Payer: Medicare Other | Admitting: Rehabilitation

## 2016-08-09 ENCOUNTER — Encounter: Payer: Self-pay | Admitting: Rehabilitation

## 2016-08-09 DIAGNOSIS — R2681 Unsteadiness on feet: Secondary | ICD-10-CM | POA: Diagnosis not present

## 2016-08-09 DIAGNOSIS — R2689 Other abnormalities of gait and mobility: Secondary | ICD-10-CM

## 2016-08-09 NOTE — Therapy (Signed)
[facility] [address] [place], [place], [address] Phone: [phone]   Fax:  [phone]  Physical Therapy Treatment  Patient Details  Name: Zachary Thornton MRN: [id number] Date of Birth: 1966-03-23 Referring Provider: Joylene Draft  Encounter Date: 08/09/2016      PT End of Session - 08/09/16 1405    Visit Number 18   Number of Visits 25   PT Start Time 1401   PT Stop Time 1445   PT Time Calculation (min) 44 min   Activity Tolerance Patient tolerated treatment well   Behavior During Therapy [facility] for tasks assessed/performed      Past Medical History:  Diagnosis Date  . Back injury   . Blood loss anemia   . Dementia with behavioral disturbance   . Duodenal ulcer hemorrhage   . GI bleed   . TBI (traumatic brain injury) [facility])     Past Surgical History:  Procedure Laterality Date  . CRANIOTOMY     Post TBI craniotomy and plate insertion  . CSF SHUNT    . ESOPHAGOGASTRODUODENOSCOPY N/A 11/01/2015   Procedure: ESOPHAGOGASTRODUODENOSCOPY (EGD);  Surgeon: Manus Gunning, MD;  Location: [facility];  Service: Gastroenterology;  Laterality: N/A;  . ESOPHAGOGASTRODUODENOSCOPY (EGD) WITH PROPOFOL N/A 10/30/2015   Procedure: ESOPHAGOGASTRODUODENOSCOPY (EGD) WITH PROPOFOL;  Surgeon: Milus Banister, MD;  Location: [facility];  Service: Endoscopy;  Laterality: N/A;    There were no vitals filed for this visit.      Subjective Assessment - 08/09/16 1405    Subjective No changes, reports doing exercises.     Patient is accompained by: Family member   Patient Stated Goals [name] goals are to improve balance and prevent future falls.   Currently in Pain? No/denies             NMR:  Continue to work on high level balance activity during session with side stepping and forward stepping through agility ladder.  Cues for improved R hip flex to decrease circumduction during activity.  Addressed stepping strategy with  braiding activity with BUE support from therapist.  Note marked improvement from last time completed with this therapist.  Educated on importance of stepping strategy, esp in crowded areas.  Performed cone tapping while standing on red therapy mat alternating LEs progressing to tipping cone over and back upright to increase time spent in SLS.  Continues to demonstrate difficulty with R SLS, therefore provided tactile cues as needed.      Gait:  Continue to address gait mechanics with treadmill training carrying over to ambulation in therapy gym to better simulate day to day environment.  Performed 5 mins on treadmill with average speed of 2.1 mph (lower end for his age group).  Continue to note decrease stride length, but able to improve with cues and visual on treadmill, also note marked improvement in RLE ER/circumduction as well as narrower BOS while on treadmill.  Pt requires moderate cues when ambulating over ground to maintain these things, as well as relaxed posture on R.                  PT Education - 08/09/16 1405    Education provided Yes   Education Details Continue to educate on gait correction-narrower BOS and decreased RLE external rotation/circumduction   Person(s) Educated Patient;Other (comment)  Sister   Methods Explanation   Comprehension Verbalized understanding          PT Short Term Goals - 06/20/16 2683  PT SHORT TERM GOAL #1   Title Pt will perform HEP with family supervision, for improved balance and mobility.  TARGET 06/22/16   Baseline met 06/20/16   Time 4   Period Weeks   Status Achieved     PT SHORT TERM GOAL #2   Title Pt will improve 5x sit<>stand to less than or equal to 17 seconds for improved transfer efficiency and safety.   Baseline 17.87 secs on 06/20/16   Time 4   Period Weeks   Status Achieved     PT SHORT TERM GOAL #3   Title Pt will improve TUG score to less than or equal to 13.5 seconds for decreased fall risk.   Baseline  15.80 secs on 06/20/16   Time 4   Period Weeks   Status Partially Met     PT SHORT TERM GOAL #4   Title Pt will imrpove Berg Balance score to at least 40/56 for decreased fall risk.   Baseline 41/56 BERG on 06/20/16   Time 4   Period Weeks   Status Achieved           PT Long Term Goals - 08/10/16 0957      PT LONG TERM GOAL #1   Title Pt/family will verbalize understanding of fall prevention within home environment.  Modified TARGET 08/30/16   Baseline needs min to mod cues to recall fall prevention strategies.     Time 4   Period Weeks   Status On-going  Goal to be ongoing at next visit     PT LONG TERM GOAL #2   Title Pt will improve Berg Balance score to at least 50/56 for decreased fall risk.   Baseline 46/56 08/02/16-updated goal due to continued POC   Time 4   Period Weeks   Status On-going     PT LONG TERM GOAL #3   Title Pt will improve Dynamic Gait Index score to at least 20/24 for decreased fall risk.   Baseline 17/24 on 08/02/16-updated goal due to continued POC   Time 4   Period Weeks   Status On-going     PT LONG TERM GOAL #4   Title Pt will verbalize plans for ongoing community fitness upon D/C from PT.   Baseline PT looking into resources for pt/mother   Time 4   Period Weeks   Status On-going  make goal ongoing on next visit     PT LONG TERM GOAL #5   Title Pt will ambulate at least 1000 ft indoor and outdoor surfaces without loss of balance, for improved community and outdoor mobility.   Baseline unable to assess due to time constraint.     Time 4   Period Weeks   Status On-going  goal to be continued     PT LONG TERM GOAL #6   Title Pt will ambulate 200' at S level with min cues only to indicate improved gait mechanics demonstrated less RLE external rotation and narrowed BOS.   Time 4   Period Weeks   Status On-going               Plan - 08/09/16 1407    Clinical Impression Statement Continue to address gait mechanics with treadmill  training carrying over to gait in therapy gym for decreased BOS, decreased RLE ER/circumduction as well as high level balance activities to decrease fall risk.     PT Frequency 2x / week   PT Duration 4 weeks   PT Treatment/Interventions  ADLs/Self Care Home Management;Therapeutic exercise;Therapeutic activities;Functional mobility training;Gait training;DME Instruction;Balance training;Neuromuscular re-education;Patient/family education   PT Next Visit Plan review HEP cont with gait trainng; cont to problem solve with family for long term goals of community fitness   Consulted and Agree with Plan of Care Patient;Family member/caregiver   Family Member Consulted sister      Patient will benefit from skilled therapeutic intervention in order to improve the following deficits and impairments:  Abnormal gait, Decreased balance  Visit Diagnosis: Other abnormalities of gait and mobility  Unsteadiness on feet     Problem List Patient Active Problem List   Diagnosis Date Noted  . Bleeding duodenal ulcer   . Hypokalemia   . Lactic acidosis   . Duodenal ulcer hemorrhage 10/31/2015  . Urinary retention 10/30/2015  . Acute blood loss anemia 10/29/2015  . Hypotension 10/29/2015  . Syncope and collapse 10/29/2015  . Traumatic brain injury with depressed frontal skull fracture ([place]) 09/07/2012  . Cognitive deficit as late effect of traumatic brain injury ([place]) 09/07/2012  . Episodic dyscontrol syndrome 09/07/2012    Cameron Sprang, PT, MPT [facility] [address] [place], [place], [address] Phone: [phone]   Fax:  [phone] 08/10/16, 10:00 AM  Name: Dameion Briles MRN: [id number] Date of Birth: 1965/12/30

## 2016-08-14 ENCOUNTER — Ambulatory Visit: Payer: Medicare Other | Admitting: Physical Therapy

## 2016-08-14 DIAGNOSIS — R2689 Other abnormalities of gait and mobility: Secondary | ICD-10-CM

## 2016-08-14 DIAGNOSIS — R2681 Unsteadiness on feet: Secondary | ICD-10-CM

## 2016-08-14 NOTE — Therapy (Signed)
[facility] [address] [place], [place], [address] Phone: [phone]   Fax:  [phone]  Physical Therapy Treatment  Patient Details  Name: Zachary Thornton MRN: [id number] Date of Birth: 04-23-1966 Referring Provider: Joylene Draft  Encounter Date: 08/14/2016      PT End of Session - 08/14/16 1507    Visit Number 19   Number of Visits 25   Date for PT Re-Evaluation 09/01/16   Authorization Type Medicare-Gcode every 10th visit   PT Start Time 1406   PT Stop Time 1445   PT Time Calculation (min) 39 min   Equipment Utilized During Treatment Gait belt   Activity Tolerance Patient tolerated treatment well   Behavior During Therapy [facility] for tasks assessed/performed      Past Medical History:  Diagnosis Date  . Back injury   . Blood loss anemia   . Dementia with behavioral disturbance   . Duodenal ulcer hemorrhage   . GI bleed   . TBI (traumatic brain injury) [facility])     Past Surgical History:  Procedure Laterality Date  . CRANIOTOMY     Post TBI craniotomy and plate insertion  . CSF SHUNT    . ESOPHAGOGASTRODUODENOSCOPY N/A 11/01/2015   Procedure: ESOPHAGOGASTRODUODENOSCOPY (EGD);  Surgeon: Manus Gunning, MD;  Location: [facility];  Service: Gastroenterology;  Laterality: N/A;  . ESOPHAGOGASTRODUODENOSCOPY (EGD) WITH PROPOFOL N/A 10/30/2015   Procedure: ESOPHAGOGASTRODUODENOSCOPY (EGD) WITH PROPOFOL;  Surgeon: Milus Banister, MD;  Location: [facility];  Service: Endoscopy;  Laterality: N/A;    There were no vitals filed for this visit.      Subjective Assessment - 08/14/16 1408    Subjective No changes, reports doing exercises.     Patient is accompained by: Family member   Patient Stated Goals [name] goals are to improve balance and prevent future falls.   Currently in Pain? No/denies                         [facility] Adult PT Treatment/Exercise - 08/14/16 0001      Ambulation/Gait   Ambulation/Gait Yes   Ambulation/Gait Assistance 4: Min guard;5: Supervision  min guard on uneven surface; supervision on level surface   Ambulation/Gait Assistance Details cues for decreasing BOS   Ambulation Distance (Feet) 240 Feet  + outside gait 200'   Assistive device None   Gait Pattern Step-through pattern;Decreased arm swing - right;Decreased step length - left;Decreased step length - right;Decreased hip/knee flexion - right;Right circumduction;Ataxic;Trunk rotated posteriorly on right;Decreased trunk rotation;Wide base of support;Abducted- right;Decreased weight shift to right   Ambulation Surface Level;Unlevel;Indoor;Outdoor;Paved;Gravel;Grass;Other (comment)  mulch   Gait Comments gait trainer: 57mn, resting HR 97bpm, SPO2 91%; during gait: HR 128bpm, SPO2 96%   greater speed and LE coordination with step length and decreased BOS             Balance Exercises - 08/14/16 1436      Balance Exercises: Standing   Standing Eyes Opened Foam/compliant surface;Wide (BOA)  alt. tapping targets with feet; head turns, intermittent UE    Gait with Head Turns Forward;Foam/compliant surface   Retro Gait Foam/compliant surface   Sidestepping Foam/compliant support   Marching Limitations on compliant surface   Overall Comments Other (comment)  min guard to min A with dynamic gait/balance on compliant surface           PT Education - 08/14/16 1500    Education provided Yes   Education Details Benefits of  aerobic training and suggested exercise equipment   Person(s) Educated Other (comment);Patient   Methods Explanation   Comprehension Verbalized understanding          PT Short Term Goals - 06/20/16 0935      PT SHORT TERM GOAL #1   Title Pt will perform HEP with family supervision, for improved balance and mobility.  TARGET 06/22/16   Baseline met 06/20/16   Time 4   Period Weeks   Status Achieved     PT SHORT TERM GOAL #2   Title Pt will improve 5x sit<>stand to  less than or equal to 17 seconds for improved transfer efficiency and safety.   Baseline 17.87 secs on 06/20/16   Time 4   Period Weeks   Status Achieved     PT SHORT TERM GOAL #3   Title Pt will improve TUG score to less than or equal to 13.5 seconds for decreased fall risk.   Baseline 15.80 secs on 06/20/16   Time 4   Period Weeks   Status Partially Met     PT SHORT TERM GOAL #4   Title Pt will imrpove Berg Balance score to at least 40/56 for decreased fall risk.   Baseline 41/56 BERG on 06/20/16   Time 4   Period Weeks   Status Achieved           PT Long Term Goals - 08/10/16 0957      PT LONG TERM GOAL #1   Title Pt/family will verbalize understanding of fall prevention within home environment.  Modified TARGET 08/30/16   Baseline needs min to mod cues to recall fall prevention strategies.     Time 4   Period Weeks   Status On-going  Goal to be ongoing at next visit     PT LONG TERM GOAL #2   Title Pt will improve Berg Balance score to at least 50/56 for decreased fall risk.   Baseline 46/56 08/02/16-updated goal due to continued POC   Time 4   Period Weeks   Status On-going     PT LONG TERM GOAL #3   Title Pt will improve Dynamic Gait Index score to at least 20/24 for decreased fall risk.   Baseline 17/24 on 08/02/16-updated goal due to continued POC   Time 4   Period Weeks   Status On-going     PT LONG TERM GOAL #4   Title Pt will verbalize plans for ongoing community fitness upon D/C from PT.   Baseline PT looking into resources for pt/mother   Time 4   Period Weeks   Status On-going  make goal ongoing on next visit     PT LONG TERM GOAL #5   Title Pt will ambulate at least 1000 ft indoor and outdoor surfaces without loss of balance, for improved community and outdoor mobility.   Baseline unable to assess due to time constraint.     Time 4   Period Weeks   Status On-going  goal to be continued     PT LONG TERM GOAL #6   Title Pt will ambulate 200' at  S level with min cues only to indicate improved gait mechanics demonstrated less RLE external rotation and narrowed BOS.   Time 4   Period Weeks   Status On-going               Plan - 08/14/16 1509    Clinical Impression Statement Pt demonstrates decrease BOS and RLE  ER/circumduction on  the treadmill but has difficulty carrying over onto ground surface.  Continued difficulty with balance and gait on compliant surfaces requiring min A or UE support. Suggested to family member and pt that treadmill or bike may be good options to maintain cardiovascular health.                                                       PT Frequency 2x / week   PT Duration 4 weeks   PT Treatment/Interventions ADLs/Self Care Home Management;Therapeutic exercise;Therapeutic activities;Functional mobility training;Gait training;DME Instruction;Balance training;Patient/family education   PT Next Visit Plan review HEP cont with gait trainng; cont to problem solve with family for long term goals of community fitness   Consulted and Agree with Plan of Care Patient;Family member/caregiver   Family Member Consulted sister      Patient will benefit from skilled therapeutic intervention in order to improve the following deficits and impairments:  Abnormal gait, Decreased balance  Visit Diagnosis: Other abnormalities of gait and mobility  Unsteadiness on feet     Problem List Patient Active Problem List   Diagnosis Date Noted  . Bleeding duodenal ulcer   . Hypokalemia   . Lactic acidosis   . Duodenal ulcer hemorrhage 10/31/2015  . Urinary retention 10/30/2015  . Acute blood loss anemia 10/29/2015  . Hypotension 10/29/2015  . Syncope and collapse 10/29/2015  . Traumatic brain injury with depressed frontal skull fracture ([place]) 09/07/2012  . Cognitive deficit as late effect of traumatic brain injury ([place]) 09/07/2012  . Episodic dyscontrol syndrome 09/07/2012    Bjorn Loser, PTA  08/14/16, 3:16 PM [facility] [address] [place], [place], [address] Phone: [phone]   Fax:  [phone]  Name: Zachary Thornton MRN: [id number] Date of Birth: Dec 15, 1966

## 2016-08-16 ENCOUNTER — Encounter: Payer: Self-pay | Admitting: Rehabilitation

## 2016-08-16 ENCOUNTER — Ambulatory Visit: Payer: Medicare Other | Admitting: Rehabilitation

## 2016-08-16 DIAGNOSIS — R2681 Unsteadiness on feet: Secondary | ICD-10-CM | POA: Diagnosis not present

## 2016-08-16 DIAGNOSIS — R2689 Other abnormalities of gait and mobility: Secondary | ICD-10-CM

## 2016-08-16 NOTE — Therapy (Signed)
[facility] [address] [place], [place], [address] Phone: [phone]   Fax:  [phone]  Physical Therapy Treatment  Patient Details  Name: Zachary Thornton MRN: [id number] Date of Birth: 05/12/1966 Referring Provider: Joylene Draft  Encounter Date: 08/16/2016      PT End of Session - 08/16/16 1436    Visit Number 20   Number of Visits 25   Date for PT Re-Evaluation 09/01/16   Authorization Type Medicare-Gcode every 10th visit   PT Start Time 1316   PT Stop Time 1402   PT Time Calculation (min) 46 min   Equipment Utilized During Treatment Gait belt   Activity Tolerance Patient tolerated treatment well   Behavior During Therapy [facility] for tasks assessed/performed      Past Medical History:  Diagnosis Date  . Back injury   . Blood loss anemia   . Dementia with behavioral disturbance   . Duodenal ulcer hemorrhage   . GI bleed   . TBI (traumatic brain injury) [facility])     Past Surgical History:  Procedure Laterality Date  . CRANIOTOMY     Post TBI craniotomy and plate insertion  . CSF SHUNT    . ESOPHAGOGASTRODUODENOSCOPY N/A 11/01/2015   Procedure: ESOPHAGOGASTRODUODENOSCOPY (EGD);  Surgeon: Manus Gunning, MD;  Location: [facility];  Service: Gastroenterology;  Laterality: N/A;  . ESOPHAGOGASTRODUODENOSCOPY (EGD) WITH PROPOFOL N/A 10/30/2015   Procedure: ESOPHAGOGASTRODUODENOSCOPY (EGD) WITH PROPOFOL;  Surgeon: Milus Banister, MD;  Location: [facility];  Service: Endoscopy;  Laterality: N/A;    There were no vitals filed for this visit.      Subjective Assessment - 08/16/16 1321    Subjective Reports no changes, no falls since last visit.    Patient is accompained by: Family member   Patient Stated Goals [name] goals are to improve balance and prevent future falls.   Currently in Pain? No/denies             NMR:  Performed gait over grass with ball toss x 100' x 1 rep then again 4' with  intermittent min A for mild LOB.  Note that with balance challenge, pt unable to maintain narrower BOS and tend to note increased stiffness in RLE when in stance.  Also performed gait with kicking ball x 100'.  Initially requires min A fading to S level with task.  Progressed to stepping over objects as well as cone tapping/stepping activity to address narrower BOS and increasing R modified SLS for NMR.  Requires cues for slow movements when in R SLS as he tends to hurry movements with LLE to decrease time on RLE.  Then performed closed chain NMR activity with transitional movements; tall kneeling to L half kneeling x 8 reps> tall kneeling to R half kneeling to standing x 5 reps with mod A fading to min/guard A for occasional LOB when returning to standing and half kneeling.    Gait:  Addressed gait over outdoor surfaces during visit to address LTG.  Performed gait >500' over paved, unlevel, grass, uphill/downhill, and over gravel surfaces.  Note intermittent min A needed, esp over hilly and grassy surfaces, therefor provided education to mother regarding keeping yard activity at S level and over more level surfaces to prevent falls.  Both verbalized understanding.                      PT Education - 08/16/16 1321    Education provided Yes   Education Details Education  on ambulation outdoors-needs S to min A on uneven grassy surfaces.     Person(s) Educated Patient;Parent(s)   Methods Explanation   Comprehension Verbalized understanding          PT Short Term Goals - 06/20/16 0935      PT SHORT TERM GOAL #1   Title Pt will perform HEP with family supervision, for improved balance and mobility.  TARGET 06/22/16   Baseline met 06/20/16   Time 4   Period Weeks   Status Achieved     PT SHORT TERM GOAL #2   Title Pt will improve 5x sit<>stand to less than or equal to 17 seconds for improved transfer efficiency and safety.   Baseline 17.87 secs on 06/20/16   Time 4   Period Weeks    Status Achieved     PT SHORT TERM GOAL #3   Title Pt will improve TUG score to less than or equal to 13.5 seconds for decreased fall risk.   Baseline 15.80 secs on 06/20/16   Time 4   Period Weeks   Status Partially Met     PT SHORT TERM GOAL #4   Title Pt will imrpove Berg Balance score to at least 40/56 for decreased fall risk.   Baseline 41/56 BERG on 06/20/16   Time 4   Period Weeks   Status Achieved           PT Long Term Goals - 08/10/16 0957      PT LONG TERM GOAL #1   Title Pt/family will verbalize understanding of fall prevention within home environment.  Modified TARGET 08/30/16   Baseline needs min to mod cues to recall fall prevention strategies.     Time 4   Period Weeks   Status On-going  Goal to be ongoing at next visit     PT LONG TERM GOAL #2   Title Pt will improve Berg Balance score to at least 50/56 for decreased fall risk.   Baseline 46/56 08/02/16-updated goal due to continued POC   Time 4   Period Weeks   Status On-going     PT LONG TERM GOAL #3   Title Pt will improve Dynamic Gait Index score to at least 20/24 for decreased fall risk.   Baseline 17/24 on 08/02/16-updated goal due to continued POC   Time 4   Period Weeks   Status On-going     PT LONG TERM GOAL #4   Title Pt will verbalize plans for ongoing community fitness upon D/C from PT.   Baseline PT looking into resources for pt/mother   Time 4   Period Weeks   Status On-going  make goal ongoing on next visit     PT LONG TERM GOAL #5   Title Pt will ambulate at least 1000 ft indoor and outdoor surfaces without loss of balance, for improved community and outdoor mobility.   Baseline unable to assess due to time constraint.     Time 4   Period Weeks   Status On-going  goal to be continued     PT LONG TERM GOAL #6   Title Pt will ambulate 200' at S level with min cues only to indicate improved gait mechanics demonstrated less RLE external rotation and narrowed BOS.   Time 4   Period  Weeks   Status On-going               Plan - 08/16/16 1437    Clinical Impression Statement Skilled session focused  on high level balance over unlevel surfaces as well as NMR for RLE with closed chain activity.     PT Frequency 2x / week   PT Duration 4 weeks   PT Treatment/Interventions ADLs/Self Care Home Management;Therapeutic exercise;Therapeutic activities;Functional mobility training;Gait training;DME Instruction;Balance training;Patient/family education   PT Next Visit Plan high level balance, gait training    Consulted and Agree with Plan of Care Patient;Family member/caregiver   Family Member Consulted sister      Patient will benefit from skilled therapeutic intervention in order to improve the following deficits and impairments:  Abnormal gait, Decreased balance  Visit Diagnosis: Other abnormalities of gait and mobility  Unsteadiness on feet       G-Codes - 2016/08/25 1438    Functional Assessment Tool Used BERG: 46/56 on 08/02/16   Functional Limitation Mobility: Walking and moving around   Mobility: Walking and Moving Around Current Status [phone]) At least 1 percent but less than 20 percent impaired, limited or restricted   Mobility: Walking and Moving Around Goal Status [phone]) At least 1 percent but less than 20 percent impaired, limited or restricted      Physical Therapy Progress Note  Dates of Reporting Period: 07/03/16 to August 25, 2016  Objective Reports of Subjective Statement: See above  Objective Measurements: BERG 46/56 on 08/02/16  Goal Update: See LTG's above  Plan: Continue POC  Reason Skilled Services are Required: Continues to have high level balance deficits as well as RLE weakness with compensatory gait patterns.     Problem List Patient Active Problem List   Diagnosis Date Noted  . Bleeding duodenal ulcer   . Hypokalemia   . Lactic acidosis   . Duodenal ulcer hemorrhage 10/31/2015  . Urinary retention 10/30/2015  . Acute blood loss  anemia 10/29/2015  . Hypotension 10/29/2015  . Syncope and collapse 10/29/2015  . Traumatic brain injury with depressed frontal skull fracture ([place]) 09/07/2012  . Cognitive deficit as late effect of traumatic brain injury ([place]) 09/07/2012  . Episodic dyscontrol syndrome 09/07/2012    Cameron Sprang, PT, MPT [facility] [address] [place], [place], [address] Phone: [phone]   Fax:  [phone] 2016/08/25, 2:39 PM  Name: Zachary Thornton MRN: [id number] Date of Birth: Apr 22, 1966

## 2016-08-21 ENCOUNTER — Ambulatory Visit: Payer: Medicare Other | Admitting: Physical Therapy

## 2016-08-21 DIAGNOSIS — R2681 Unsteadiness on feet: Secondary | ICD-10-CM

## 2016-08-21 DIAGNOSIS — R2689 Other abnormalities of gait and mobility: Secondary | ICD-10-CM

## 2016-08-21 NOTE — Patient Instructions (Addendum)
Upper / Lower Extremity Extension (All-Fours)( performed x10 min A for balance 08/21/16)    Tighten stomach and raise right leg and opposite arm. Keep trunk rigid. Repeat __10 __ times per set.   http://orth.exer.us/1118   Copyright  VHI. All rights reserved.  Bridge  (perfromed 10x2, 08/21/16)    Lift hips, keeping pelvis level. Do 10___ times, _2__ times per day.  Also roll the ball back toward your bottom 10 times   http://ss.exer.us/364   Copyright  VHI. All rights reserved.  All balance exercises, stand in corner with chair in front of you in case you lose your balance. Have mom be with you for the ones where your eyes are closed.    SINGLE LIMB STANCE (performed 3x each with intermittent UE support, 08/21/16)    Stance: single leg on floor. Raise leg. Hold _15__ seconds. Repeat with other leg. Start by using one hand to hold onto chair. The better you get, work on lightening how much you do with that hand. For example use whole hand, move to 4 fingers, 3 fingers, and so on.  _3__ reps per set on each side, _2__ sets per day, _5-7__ days per week  Copyright  VHI. All rights reserved.  Tandem Walking (performed with min to close supervision )    Walk beside counter top with hand on counter for support.  Walk with each foot directly in front of other, heel of one foot touching toes of other foot with each step. Both feet straight ahead.  When you are at the end of the counter, then walk backwards in this manner (heel to toe).  Repeat 3 times down and back.      Copyright  VHI. All rights reserved.  Feet Apart (Compliant Surface) Arm Motion - Eyes Open (performed and progressed with head turns, intermittent UE support, 08/21/16)    With eyes open, standing on compliant surface: __stacked pillows or couch cushion (make sure not too thin or thick)______, feet shoulder width apart. Keep arms by your side.  Repeat _3___ times per session for 30 seconds. Do __2__  sessions per day.  Copyright  VHI. All rights reserved.   Copyright  VHI. All rights reserved.     Feet Together (Compliant Surface) Arm Motion - Eyes Open (Performed, intermittent UE support, 08/21/16)    With eyes open, standing on compliant surface:__stacked pillows or cushion as above______, feet together. Keep arms by your side.  Repeat _3___ times per session for 30 secs each. Do __2__ sessions per day.  Copyright  VHI. All rights reserved.   Feet Apart (Compliant Surface) Arm Motion - Eyes Closed  (Performed, intermittent UE support, 08/21/16)    Stand on compliant surface: __stacked pillows or cushion______, feet shoulder width apart. Close eyes and keep arms by your side.  Repeat _3___ times per session for 30 secs each. Do __2__ sessions per day.  Copyright  VHI. All rights reserved.

## 2016-08-21 NOTE — Therapy (Signed)
[facility] [address] [place], [place], [address] Phone: [phone]   Fax:  [phone]  Physical Therapy Treatment  Patient Details  Name: Zachary Thornton MRN: [id number] Date of Birth: 11-16-1966 Referring Provider: Joylene Draft  Encounter Date: 08/21/2016      PT End of Session - 08/21/16 1149    Visit Number 21   Number of Visits 25   Date for PT Re-Evaluation 09/01/16   Authorization Type Medicare-Gcode every 10th visit   PT Start Time 1105   PT Stop Time 1148   PT Time Calculation (min) 43 min   Equipment Utilized During Treatment Gait belt   Activity Tolerance Patient tolerated treatment well   Behavior During Therapy [facility] for tasks assessed/performed      Past Medical History:  Diagnosis Date  . Back injury   . Blood loss anemia   . Dementia with behavioral disturbance   . Duodenal ulcer hemorrhage   . GI bleed   . TBI (traumatic brain injury) [facility])     Past Surgical History:  Procedure Laterality Date  . CRANIOTOMY     Post TBI craniotomy and plate insertion  . CSF SHUNT    . ESOPHAGOGASTRODUODENOSCOPY N/A 11/01/2015   Procedure: ESOPHAGOGASTRODUODENOSCOPY (EGD);  Surgeon: Manus Gunning, MD;  Location: [facility];  Service: Gastroenterology;  Laterality: N/A;  . ESOPHAGOGASTRODUODENOSCOPY (EGD) WITH PROPOFOL N/A 10/30/2015   Procedure: ESOPHAGOGASTRODUODENOSCOPY (EGD) WITH PROPOFOL;  Surgeon: Milus Banister, MD;  Location: [facility];  Service: Endoscopy;  Laterality: N/A;    There were no vitals filed for this visit.      Subjective Assessment - 08/21/16 1109    Subjective [name] mom reports that he has not been focusing on HEP but has been focusing on community type activities and they report great improvement since starting PT..   Currently in Pain? No/denies                                 PT Education - 08/21/16 1332    Education provided Yes   Education  Details Rearranged HEP and re-issued HEP ephasizing benefits for strength and balance, compliance with performing consistently 10-74mn/day.  Encouraged pt and caregiver to practised various walking surfaces at the park.   Person(s) Educated Patient;Parent(s)   Methods Explanation;Demonstration;Handout;Verbal cues   Comprehension Verbalized understanding;Returned demonstration;Verbal cues required          PT Short Term Goals - 06/20/16 0935      PT SHORT TERM GOAL #1   Title Pt will perform HEP with family supervision, for improved balance and mobility.  TARGET 06/22/16   Baseline met 06/20/16   Time 4   Period Weeks   Status Achieved     PT SHORT TERM GOAL #2   Title Pt will improve 5x sit<>stand to less than or equal to 17 seconds for improved transfer efficiency and safety.   Baseline 17.87 secs on 06/20/16   Time 4   Period Weeks   Status Achieved     PT SHORT TERM GOAL #3   Title Pt will improve TUG score to less than or equal to 13.5 seconds for decreased fall risk.   Baseline 15.80 secs on 06/20/16   Time 4   Period Weeks   Status Partially Met     PT SHORT TERM GOAL #4   Title Pt will imrpove Berg Balance score to at least 40/56 for decreased  fall risk.   Baseline 41/56 BERG on 06/20/16   Time 4   Period Weeks   Status Achieved           PT Long Term Goals - 08/10/16 0957      PT LONG TERM GOAL #1   Title Pt/family will verbalize understanding of fall prevention within home environment.  Modified TARGET 08/30/16   Baseline needs min to mod cues to recall fall prevention strategies.     Time 4   Period Weeks   Status On-going  Goal to be ongoing at next visit     PT LONG TERM GOAL #2   Title Pt will improve Berg Balance score to at least 50/56 for decreased fall risk.   Baseline 46/56 08/02/16-updated goal due to continued POC   Time 4   Period Weeks   Status On-going     PT LONG TERM GOAL #3   Title Pt will improve Dynamic Gait Index score to at least  20/24 for decreased fall risk.   Baseline 17/24 on 08/02/16-updated goal due to continued POC   Time 4   Period Weeks   Status On-going     PT LONG TERM GOAL #4   Title Pt will verbalize plans for ongoing community fitness upon D/C from PT.   Baseline PT looking into resources for pt/mother   Time 4   Period Weeks   Status On-going  make goal ongoing on next visit     PT LONG TERM GOAL #5   Title Pt will ambulate at least 1000 ft indoor and outdoor surfaces without loss of balance, for improved community and outdoor mobility.   Baseline unable to assess due to time constraint.     Time 4   Period Weeks   Status On-going  goal to be continued     PT LONG TERM GOAL #6   Title Pt will ambulate 200' at S level with min cues only to indicate improved gait mechanics demonstrated less RLE external rotation and narrowed BOS.   Time 4   Period Weeks   Status On-going               Plan - 08/21/16 1337    Clinical Impression Statement [name] reports that pt has not been doing HEP but rather they have focused his activities on more functional daily routine and community activities, which he has progressed with since starting this  bout of PT.  When asked, pt's mother  stated that they would like to see his standing balance with more narrow BOS get better.  This PT then explained the purpose of HEP.  Rearranged HEP and re-issued HEP ephasizing benefits for strength and balance, compliance with performing consistently 10-7mn/day.  Pt requires min A or UE support for tandem or SLS.                                                                             PT Frequency 2x / week   PT Duration 4 weeks   PT Treatment/Interventions ADLs/Self Care Home Management;Therapeutic exercise;Therapeutic activities;Functional mobility training;Gait training;DME Instruction;Balance training;Patient/family education   PT Next Visit Plan high level balance, gait training    Consulted  and Agree  with Plan of Care Patient;Family member/caregiver   Family Member Consulted sister      Patient will benefit from skilled therapeutic intervention in order to improve the following deficits and impairments:  Abnormal gait, Decreased balance  Visit Diagnosis: Other abnormalities of gait and mobility  Unsteadiness on feet     Problem List Patient Active Problem List   Diagnosis Date Noted  . Bleeding duodenal ulcer   . Hypokalemia   . Lactic acidosis   . Duodenal ulcer hemorrhage 10/31/2015  . Urinary retention 10/30/2015  . Acute blood loss anemia 10/29/2015  . Hypotension 10/29/2015  . Syncope and collapse 10/29/2015  . Traumatic brain injury with depressed frontal skull fracture ([place]) 09/07/2012  . Cognitive deficit as late effect of traumatic brain injury ([place]) 09/07/2012  . Episodic dyscontrol syndrome 09/07/2012    Bjorn Loser, PTA  08/21/16, 1:43 PM [facility] [address] [place], [place], [address] Phone: [phone]   Fax:  [phone]  Name: Zachary Thornton MRN: [id number] Date of Birth: [birth year]

## 2016-08-23 ENCOUNTER — Ambulatory Visit: Payer: Medicare Other | Attending: Internal Medicine | Admitting: Rehabilitation

## 2016-08-23 ENCOUNTER — Encounter: Payer: Self-pay | Admitting: Rehabilitation

## 2016-08-23 DIAGNOSIS — R2689 Other abnormalities of gait and mobility: Secondary | ICD-10-CM | POA: Diagnosis not present

## 2016-08-23 DIAGNOSIS — R2681 Unsteadiness on feet: Secondary | ICD-10-CM | POA: Insufficient documentation

## 2016-08-23 NOTE — Patient Instructions (Signed)
SINGLE LIMB STANCE (performed 3x each with intermittent UE support, 08/23/16)    Stance: single leg on floor. Raise leg. Hold _15__ seconds. Repeat with other leg. Start by using one hand to hold onto chair. The better you get, work on lightening how much you do with that hand. For example use whole hand, move to 4 fingers, 3 fingers, and so on.  _3__ reps per set on each side, _2__ sets per day, _5-7__ days per week  Upper / Lower Extremity Extension (All-Fours)( performed x10 min A for balance 08/23/16)    Tighten stomach and raise right leg and opposite arm. Keep trunk rigid. Repeat __10 __ times per set.

## 2016-08-23 NOTE — Therapy (Signed)
[facility] [address] [place], [place], [address] Phone: [phone]   Fax:  [phone]  Physical Therapy Treatment  Patient Details  Name: Zachary Thornton MRN: [id number] Date of Birth: [birth year] Referring Provider: Joylene Draft  Encounter Date: 08/23/2016      PT End of Session - 08/23/16 1441    Visit Number 22   Number of Visits 25   Date for PT Re-Evaluation 09/01/16   Authorization Type Medicare-Gcode every 10th visit   PT Start Time 1105   PT Stop Time 1147   PT Time Calculation (min) 42 min   Equipment Utilized During Treatment Gait belt   Activity Tolerance Patient tolerated treatment well   Behavior During Therapy [facility] for tasks assessed/performed      Past Medical History:  Diagnosis Date  . Back injury   . Blood loss anemia   . Dementia with behavioral disturbance   . Duodenal ulcer hemorrhage   . GI bleed   . TBI (traumatic brain injury) [facility])     Past Surgical History:  Procedure Laterality Date  . CRANIOTOMY     Post TBI craniotomy and plate insertion  . CSF SHUNT    . ESOPHAGOGASTRODUODENOSCOPY N/A 11/01/2015   Procedure: ESOPHAGOGASTRODUODENOSCOPY (EGD);  Surgeon: Manus Gunning, MD;  Location: [facility];  Service: Gastroenterology;  Laterality: N/A;  . ESOPHAGOGASTRODUODENOSCOPY (EGD) WITH PROPOFOL N/A 10/30/2015   Procedure: ESOPHAGOGASTRODUODENOSCOPY (EGD) WITH PROPOFOL;  Surgeon: Milus Banister, MD;  Location: [facility];  Service: Endoscopy;  Laterality: N/A;    There were no vitals filed for this visit.      Subjective Assessment - 08/23/16 1105    Subjective Pt reports no changes                NMR:  Performed two exercises from HEP, see pt instruction.  Worked on high level balance and hip strategy with sit<>stand while feet are shoulder width apart on foam balance beam x 10 reps with cues for relaxed posture and improved hip extension.  Progressed to  performing PWR trunk rotation while standing on foam balance beam x 10 reps with min to mod to maintain balance.  Continued with balance in // bars on small rocker board in forward facing direction maintaining balance x 2 reps of 30 mins progressing to moving forward and backwards slowly to improve ankle and hip movement as he continues to demonstrate difficulty eliciting hip strategy.  Performed x 10 reps.  Then performed BOSU ball task with black top up maintaining balance x 2 reps of 30 secs and performing mini squats x 10 reps all with min A to prevent LOB.  Progressed to working on improved trunk control/postural control with seated physioball tasks. Performed LAQ's x 10 reps and ended with seated scapular retraction on physioball x 10 reps with green theraband.  Pt tolerated well.                      PT Short Term Goals - 06/20/16 0935      PT SHORT TERM GOAL #1   Title Pt will perform HEP with family supervision, for improved balance and mobility.  TARGET 06/22/16   Baseline met 06/20/16   Time 4   Period Weeks   Status Achieved     PT SHORT TERM GOAL #2   Title Pt will improve 5x sit<>stand to less than or equal to 17 seconds for improved transfer efficiency and safety.   Baseline 17.87  secs on 06/20/16   Time 4   Period Weeks   Status Achieved     PT SHORT TERM GOAL #3   Title Pt will improve TUG score to less than or equal to 13.5 seconds for decreased fall risk.   Baseline 15.80 secs on 06/20/16   Time 4   Period Weeks   Status Partially Met     PT SHORT TERM GOAL #4   Title Pt will imrpove Berg Balance score to at least 40/56 for decreased fall risk.   Baseline 41/56 BERG on 06/20/16   Time 4   Period Weeks   Status Achieved           PT Long Term Goals - 08/10/16 0957      PT LONG TERM GOAL #1   Title Pt/family will verbalize understanding of fall prevention within home environment.  Modified TARGET 08/30/16   Baseline needs min to mod cues to recall  fall prevention strategies.     Time 4   Period Weeks   Status On-going  Goal to be ongoing at next visit     PT LONG TERM GOAL #2   Title Pt will improve Berg Balance score to at least 50/56 for decreased fall risk.   Baseline 46/56 08/02/16-updated goal due to continued POC   Time 4   Period Weeks   Status On-going     PT LONG TERM GOAL #3   Title Pt will improve Dynamic Gait Index score to at least 20/24 for decreased fall risk.   Baseline 17/24 on 08/02/16-updated goal due to continued POC   Time 4   Period Weeks   Status On-going     PT LONG TERM GOAL #4   Title Pt will verbalize plans for ongoing community fitness upon D/C from PT.   Baseline PT looking into resources for pt/mother   Time 4   Period Weeks   Status On-going  make goal ongoing on next visit     PT LONG TERM GOAL #5   Title Pt will ambulate at least 1000 ft indoor and outdoor surfaces without loss of balance, for improved community and outdoor mobility.   Baseline unable to assess due to time constraint.     Time 4   Period Weeks   Status On-going  goal to be continued     PT LONG TERM GOAL #6   Title Pt will ambulate 200' at S level with min cues only to indicate improved gait mechanics demonstrated less RLE external rotation and narrowed BOS.   Time 4   Period Weeks   Status On-going               Plan - 08/23/16 1441    Clinical Impression Statement Pts mother reports they are working on exercises at home, went over two during session, see pt instruction.  Continue high level balance tasks for improved hip strategy.  Discussed scheduling conflict next week and will plan to D/C on Friday.     PT Frequency 2x / week   PT Duration 4 weeks   PT Treatment/Interventions ADLs/Self Care Home Management;Therapeutic exercise;Therapeutic activities;Functional mobility training;Gait training;DME Instruction;Balance training;Patient/family education   PT Next Visit Plan LTG's and D/C   Consulted and  Agree with Plan of Care Patient;Family member/caregiver   Family Member Consulted mother      Patient will benefit from skilled therapeutic intervention in order to improve the following deficits and impairments:  Abnormal gait, Decreased balance  Visit  Diagnosis: Other abnormalities of gait and mobility  Unsteadiness on feet     Problem List Patient Active Problem List   Diagnosis Date Noted  . Bleeding duodenal ulcer   . Hypokalemia   . Lactic acidosis   . Duodenal ulcer hemorrhage 10/31/2015  . Urinary retention 10/30/2015  . Acute blood loss anemia 10/29/2015  . Hypotension 10/29/2015  . Syncope and collapse 10/29/2015  . Traumatic brain injury with depressed frontal skull fracture ([place]) 09/07/2012  . Cognitive deficit as late effect of traumatic brain injury ([place]) 09/07/2012  . Episodic dyscontrol syndrome 09/07/2012    Cameron Sprang, PT, MPT [facility] [address] [place], [place], [address] Phone: [phone]   Fax:  [phone] 08/23/16, 2:43 PM  Name: Kavish Lafitte MRN: [id number] Date of Birth: [birth year]

## 2016-08-27 ENCOUNTER — Other Ambulatory Visit: Payer: Self-pay | Admitting: Gastroenterology

## 2016-08-28 ENCOUNTER — Ambulatory Visit: Payer: Medicare Other | Admitting: Physical Therapy

## 2016-08-30 ENCOUNTER — Ambulatory Visit: Payer: Medicare Other | Admitting: Rehabilitation

## 2016-08-30 ENCOUNTER — Encounter: Payer: Self-pay | Admitting: Rehabilitation

## 2016-08-30 DIAGNOSIS — R2681 Unsteadiness on feet: Secondary | ICD-10-CM | POA: Diagnosis not present

## 2016-08-30 DIAGNOSIS — R2689 Other abnormalities of gait and mobility: Secondary | ICD-10-CM | POA: Diagnosis not present

## 2016-08-30 NOTE — Therapy (Signed)
[facility] [address] [place], [place], [address] Phone: [phone]   Fax:  [phone]  Physical Therapy Treatment and DC Summary  Patient Details  Name: Zachary Thornton MRN: [id number] Date of Birth: 10/19/1966 Referring Provider: Joylene Draft  Encounter Date: 08/30/2016      PT End of Session - 08/30/16 0937    Visit Number 23   Number of Visits 25   Date for PT Re-Evaluation 09/01/16   Authorization Type Medicare-Gcode every 10th visit   PT Start Time 0935  pt late to appt   PT Stop Time 1015   PT Time Calculation (min) 40 min   Equipment Utilized During Treatment Gait belt   Activity Tolerance Patient tolerated treatment well   Behavior During Therapy [facility] for tasks assessed/performed      Past Medical History:  Diagnosis Date  . Back injury   . Blood loss anemia   . Dementia with behavioral disturbance   . Duodenal ulcer hemorrhage   . GI bleed   . TBI (traumatic brain injury) [facility])     Past Surgical History:  Procedure Laterality Date  . CRANIOTOMY     Post TBI craniotomy and plate insertion  . CSF SHUNT    . ESOPHAGOGASTRODUODENOSCOPY N/A 11/01/2015   Procedure: ESOPHAGOGASTRODUODENOSCOPY (EGD);  Surgeon: Manus Gunning, MD;  Location: [facility];  Service: Gastroenterology;  Laterality: N/A;  . ESOPHAGOGASTRODUODENOSCOPY (EGD) WITH PROPOFOL N/A 10/30/2015   Procedure: ESOPHAGOGASTRODUODENOSCOPY (EGD) WITH PROPOFOL;  Surgeon: Milus Banister, MD;  Location: [facility];  Service: Endoscopy;  Laterality: N/A;    There were no vitals filed for this visit.      Subjective Assessment - 08/30/16 0937    Subjective reports no changes since last visit, no falls.    Patient is accompained by: Family member   Patient Stated Goals [name] goals are to improve balance and prevent future falls.   Currently in Pain? No/denies                         [facility] Adult PT Treatment/Exercise -  08/30/16 0940      Standardized Balance Assessment   Standardized Balance Assessment Berg Balance Test;Dynamic Gait Index     Berg Balance Test   Sit to Stand Able to stand without using hands and stabilize independently   Standing Unsupported Able to stand safely 2 minutes   Sitting with Back Unsupported but Feet Supported on Floor or Stool Able to sit safely and securely 2 minutes   Stand to Sit Sits safely with minimal use of hands   Transfers Able to transfer safely, minor use of hands   Standing Unsupported with Eyes Closed Able to stand 10 seconds safely   Standing Ubsupported with Feet Together Able to place feet together independently and stand 1 minute safely   From Standing, Reach Forward with Outstretched Arm Can reach confidently >25 cm (10")   From Standing Position, Pick up Object from Floor Able to pick up shoe safely and easily   From Standing Position, Turn to Look Behind Over each Shoulder Looks behind from both sides and weight shifts well   Turn 360 Degrees Able to turn 360 degrees safely one side only in 4 seconds or less   Standing Unsupported, Alternately Place Feet on Step/Stool Able to stand independently and complete 8 steps >20 seconds   Standing Unsupported, One Foot in Front Able to plae foot ahead of the other independently and  hold 30 seconds   Standing on One Leg Tries to lift leg/unable to hold 3 seconds but remains standing independently   Total Score 50     Dynamic Gait Index   Level Surface Mild Impairment   Change in Gait Speed Normal  no significant change from baseline   Gait with Horizontal Head Turns Normal  no significant change from baseline   Gait with Vertical Head Turns Normal  no significant change from baseline   Gait and Pivot Turn Normal   Step Over Obstacle Mild Impairment   Step Around Obstacles Normal  no significant change from baseline   Steps Mild Impairment   Total Score 21      NMR:  Performed DGI and BERG balance test  during session to assess LTGs.  Note results above, meeting both LTGs and demonstrating marked improvement in balance.  Education on results and meaning.    Gait: Ambulation over varying outdoor surfaces x 1000' at mod I level (with gait deviations) without LOB, even in level grass, therefore provided education to allow him to do this at home.  Also performed gait over indoor surfaces at mod I level, however provided S for cues on improved mechanics.  Requires up to mod A cues, esp to carryover from task to task.  Education on how mother can provide cues at home/community to continue to address this.    Self Care:  Education on continued community fitness and fall prevention strategies for LTGs.  Pt has met these goals.            PT Education - 08/30/16 [phone]    Education provided Yes   Education Details importance of continued community fitness   Person(s) Educated Patient;Parent(s)   Methods Explanation   Comprehension Verbalized understanding          PT Short Term Goals - 06/20/16 0935      PT SHORT TERM GOAL #1   Title Pt will perform HEP with family supervision, for improved balance and mobility.  TARGET 06/22/16   Baseline met 06/20/16   Time 4   Period Weeks   Status Achieved     PT SHORT TERM GOAL #2   Title Pt will improve 5x sit<>stand to less than or equal to 17 seconds for improved transfer efficiency and safety.   Baseline 17.87 secs on 06/20/16   Time 4   Period Weeks   Status Achieved     PT SHORT TERM GOAL #3   Title Pt will improve TUG score to less than or equal to 13.5 seconds for decreased fall risk.   Baseline 15.80 secs on 06/20/16   Time 4   Period Weeks   Status Partially Met     PT SHORT TERM GOAL #4   Title Pt will imrpove Berg Balance score to at least 40/56 for decreased fall risk.   Baseline 41/56 BERG on 06/20/16   Time 4   Period Weeks   Status Achieved           PT Long Term Goals - 08/30/16 0263      PT LONG TERM GOAL #1   Title  Pt/family will verbalize understanding of fall prevention within home environment.  Modified TARGET 08/30/16   Baseline needs min to mod cues to recall fall prevention strategies.     Time 4   Period Weeks   Status Achieved  Goal to be ongoing at next visit     PT LONG TERM GOAL #2  Title Pt will improve Berg Balance score to at least 50/56 for decreased fall risk.   Baseline 50/56 on 2016-09-17   Time 4   Period Weeks   Status Achieved     PT LONG TERM GOAL #3   Title Pt will improve Dynamic Gait Index score to at least 20/24 for decreased fall risk.   Baseline 21/24 on 2016/09/17   Time 4   Period Weeks   Status Achieved     PT LONG TERM GOAL #4   Title Pt will verbalize plans for ongoing community fitness upon D/C from PT.   Baseline Pt and mother verbalize they are going to join the [facility].    Time 4   Period Weeks   Status Achieved  make goal ongoing on next visit     PT LONG TERM GOAL #5   Title Pt will ambulate at least 1000 ft indoor and outdoor surfaces without loss of balance, for improved community and outdoor mobility.   Baseline met 09-17-2016   Time 4   Period Weeks   Status Achieved  goal to be continued     PT LONG TERM GOAL #6   Title Pt will ambulate 200' at S level with min cues only to indicate improved gait mechanics demonstrated less RLE external rotation and narrowed BOS.   Baseline requires min to mod cues to continue carryover, esp between sessions, however can improve with cues.     Time 4   Period Weeks   Status Partially Met               Plan - 2016-09-17 0938    Clinical Impression Statement Pt has met 5/6 LTGs during session, only partially meeting goal for improved gait mechanics with min cues as he still requires up to mod A cues, esp for carryover of improved gait.  Pt ready for D/C and mother verbalizing that they are planning to join the [facility].  Education on getting staff to assist for set up as needed for any equipment he may want to use there.   Pt and mother verbalized understanding.    PT Frequency 2x / week   PT Duration 4 weeks   PT Treatment/Interventions ADLs/Self Care Home Management;Therapeutic exercise;Therapeutic activities;Functional mobility training;Gait training;DME Instruction;Balance training;Patient/family education   PT Next Visit Plan n/a   Consulted and Agree with Plan of Care Patient;Family member/caregiver   Family Member Consulted mother      Patient will benefit from skilled therapeutic intervention in order to improve the following deficits and impairments:  Abnormal gait, Decreased balance  Visit Diagnosis: Other abnormalities of gait and mobility  Unsteadiness on feet       G-Codes - 09/17/2016 1253    Functional Assessment Tool Used BERG: 50/56, DGI: 21/24   Functional Limitation Mobility: Walking and moving around   Mobility: Walking and Moving Around Current Status ([id number]) At least 1 percent but less than 20 percent impaired, limited or restricted   Mobility: Walking and Moving Around Goal Status [phone]) At least 1 percent but less than 20 percent impaired, limited or restricted   Mobility: Walking and Moving Around Discharge Status [id number]) At least 1 percent but less than 20 percent impaired, limited or restricted      PHYSICAL THERAPY DISCHARGE SUMMARY  Visits from Start of Care: 23  Current functional level related to goals / functional outcomes: See LTGs above   Remaining deficits: Continues to have high level balance deficits, increased BOS with R LE external  rotation with R shoulder elevation during gait, has improved since evaluation.    Education / Equipment: HEP, education on community fitness and brain injury support group.   Plan: Patient agrees to discharge.  Patient goals were met. Patient is being discharged due to meeting the stated rehab goals.  ?????         Problem List Patient Active Problem List   Diagnosis Date Noted  . Bleeding duodenal ulcer   .  Hypokalemia   . Lactic acidosis   . Duodenal ulcer hemorrhage 10/31/2015  . Urinary retention 10/30/2015  . Acute blood loss anemia 10/29/2015  . Hypotension 10/29/2015  . Syncope and collapse 10/29/2015  . Traumatic brain injury with depressed frontal skull fracture ([place]) 09/07/2012  . Cognitive deficit as late effect of traumatic brain injury ([place]) 09/07/2012  . Episodic dyscontrol syndrome 09/07/2012    Cameron Sprang, PT, MPT [facility] [address] [place], [place], [address] Phone: [phone]   Fax:  [phone] 08/30/16, 12:55 PM  Name: Jaquari Reckner MRN: [id number] Date of Birth: 08-31-1966

## 2016-09-09 DIAGNOSIS — R350 Frequency of micturition: Secondary | ICD-10-CM | POA: Diagnosis not present

## 2016-09-09 DIAGNOSIS — N401 Enlarged prostate with lower urinary tract symptoms: Secondary | ICD-10-CM | POA: Diagnosis not present

## 2016-09-09 DIAGNOSIS — R3912 Poor urinary stream: Secondary | ICD-10-CM | POA: Diagnosis not present

## 2016-09-26 ENCOUNTER — Ambulatory Visit (INDEPENDENT_AMBULATORY_CARE_PROVIDER_SITE_OTHER): Payer: Medicare Other | Admitting: Neurology

## 2016-09-26 ENCOUNTER — Other Ambulatory Visit: Payer: Self-pay | Admitting: Gastroenterology

## 2016-09-26 ENCOUNTER — Encounter: Payer: Self-pay | Admitting: Neurology

## 2016-09-26 VITALS — BP 126/74 | HR 99 | Wt 188.0 lb

## 2016-09-26 DIAGNOSIS — R569 Unspecified convulsions: Secondary | ICD-10-CM | POA: Diagnosis not present

## 2016-09-26 DIAGNOSIS — S069X5D Unspecified intracranial injury with loss of consciousness greater than 24 hours with return to pre-existing conscious level, subsequent encounter: Secondary | ICD-10-CM

## 2016-09-26 NOTE — Progress Notes (Signed)
Chart forwarded.  

## 2016-09-26 NOTE — Progress Notes (Signed)
NEUROLOGY FOLLOW UP OFFICE NOTE  Zachary LoosenScott Leis [id number]  HISTORY OF PRESENT ILLNESS: Zachary Thornton is a 50 year old man with TBI and history of GI bleed who follows up for seizure.  History supplemented by a family friend who accompanies him.   UPDATE: He takes Keppra 500mg  twice daily.  He also takes Tegretol 600mg  twice daily for mood.  He feels well.  He has not had any recurrent seizures.  HISTORY: He had a traumatic brain injury in 2012 after falling and hitting the back of his head on concrete.  He fractured his skull and sustained intracranial bleeding, requiring right craniectomy and VP shunt. [name].  He was in a coma for 6 weeks.  After 2 month hospitalization, he was discharged to inpatient rehab facility where he had to relearn how to walk, talk and feed himself.  His wife was unable to care for him and he subsequently moved in with his mother, who is his guardian.  His [place] and [place] live in [place].   He was hospitalized in December for GI bleed due to duodenal ulcer, requiring multiple blood transfusions.   He presented to the ED on 04/29/16 following an unwitnessed fall in which he hit his head.  He did not recall the event.  His mother heard him fall in the bathroom and found him on the floor with eyes rolled back, unresponsive with flexed posturing and shaking of both upper extremities, lasting about 2 minutes.  He had bowel and bladder incontinence.  He was slightly confused afterwards for about 10 minutes.  He did not exhibit foaming at the mouth or tongue biting.  CT of head was personally reviewed and showed chronic changes of traumatic brain injury with bifrontal and temporal encephalomalacia, as well as post surgical changes of right craniectomy with cranioplasty and left frontal approach VP shunt.  No evidence of hydrocephalus seen.  He was started on Keppra.  He has not had any recurrent spells.  He was found to have worsening anemia and received further transfusions by his  PCP.  He does not drive.  He has no prior history of seizures.  PAST MEDICAL HISTORY: Past Medical History:  Diagnosis Date  . Back injury   . Blood loss anemia   . Dementia with behavioral disturbance   . Duodenal ulcer hemorrhage   . GI bleed   . TBI (traumatic brain injury) [facility](HCC)     MEDICATIONS: Current Outpatient Prescriptions on File Prior to Visit  Medication Sig Dispense Refill  . carbamazepine (TEGRETOL) 200 MG tablet Take 600 mg by mouth 2 (two) times daily.    . cholecalciferol (VITAMIN D) 1000 UNITS tablet Take 1,000 Units by mouth daily.    [name]. escitalopram (LEXAPRO) 20 MG tablet Take 20 mg by mouth daily.    [name]. levETIRAcetam (KEPPRA) 500 MG tablet Take 1 tablet (500 mg total) by mouth 2 (two) times daily. 60 tablet 0  . Multiple Vitamin (MULTIVITAMIN WITH MINERALS) TABS Take 1 tablet by mouth daily.    . tamsulosin (FLOMAX) 0.4 MG CAPS capsule Take 1 capsule (0.4 mg total) by mouth daily after breakfast. 30 capsule 0   No current facility-administered medications on file prior to visit.     ALLERGIES: Allergies  Allergen Reactions  . Nuedexta [Dextromethorphan-Quinidine] Anaphylaxis    Lips began to swell  . Nsaids Other (See Comments)    Due to GI bleeding    FAMILY HISTORY: Family History  Problem Relation Age of Onset  .  Heart disease Father     MVR  . Hypertension Mother   . Stroke Maternal Aunt     SOCIAL HISTORY: Social History   Social History  . Marital status: Married    Spouse name: N/A  . Number of children: 1  . Years of education: N/A   Occupational History  . Disabled    Social History Main Topics  . Smoking status: Former Games developer  . Smokeless tobacco: Never Used  . Alcohol use No  . Drug use: No  . Sexual activity: Yes   Other Topics Concern  . Not on file   Social History Narrative   Lives with his mother.  Normally ambulates without assistance.      REVIEW OF SYSTEMS: Constitutional: No fevers, chills, or sweats, no  generalized fatigue, change in appetite Eyes: No visual changes, double vision, eye pain Ear, nose and throat: No hearing loss, ear pain, nasal congestion, sore throat Cardiovascular: No chest pain, palpitations Respiratory:  No shortness of breath at rest or with exertion, wheezes GastrointestinaI: No nausea, vomiting, diarrhea, abdominal pain, fecal incontinence Genitourinary:  No dysuria, urinary retention or frequency Musculoskeletal:  No neck pain, back pain Integumentary: No rash, pruritus, skin lesions Neurological: as above Psychiatric: No depression, insomnia, anxiety Endocrine: No palpitations, fatigue, diaphoresis, mood swings, change in appetite, change in weight, increased thirst Hematologic/Lymphatic:  No purpura, petechiae. Allergic/Immunologic: no itchy/runny eyes, nasal congestion, recent allergic reactions, rashes  PHYSICAL EXAM: Vitals:   09/26/16 1104  BP: 126/74  Pulse: 99   General: No acute distress.  Patient appears well-groomed.  normal body habitus. Head:  Normocephalic/atraumatic Eyes:  Fundi examined but not visualized Neck: supple, no paraspinal tenderness, full range of motion Heart:  Regular rate and rhythm Lungs:  Clear to auscultation bilaterally Back: No paraspinal tenderness Neurological Exam: alert and oriented to person, place, and time. Attention span and concentration intact, recent and remote memory intact, fund of knowledge intact.  Speech fluent and not dysarthric, language intact.  CN II-XII intact. Bulk and tone normal, muscle strength 5/5 throughout.  Sensation to light touch  intact.  Deep tendon reflexes 2+ throughout.  Finger to nose testing intact.  Left hemiplegic gait.  Romberg with sway.  IMPRESSION: Probable symptomatic seizure as late effect of traumatic brain injury  PLAN: Keppra 500mg  twice daily and Tegretol 600mg  twice daily (for mood) Follow up in 7 months.  15 minutes spent face to face with patient, over 50% spent  counseling.  Shon Millet, DO  CC:  [name], MD

## 2016-09-26 NOTE — Patient Instructions (Signed)
I'm glad you are doing well Continue the carbamazepine and levetiracetam as prescribed Follow up in May Contact me with any questions or concerns

## 2016-10-21 ENCOUNTER — Encounter: Payer: Self-pay | Admitting: Neurology

## 2016-10-21 DIAGNOSIS — E784 Other hyperlipidemia: Secondary | ICD-10-CM | POA: Diagnosis not present

## 2016-10-21 DIAGNOSIS — S069X0S Unspecified intracranial injury without loss of consciousness, sequela: Secondary | ICD-10-CM | POA: Diagnosis not present

## 2016-10-21 DIAGNOSIS — D6489 Other specified anemias: Secondary | ICD-10-CM | POA: Diagnosis not present

## 2016-10-21 DIAGNOSIS — E559 Vitamin D deficiency, unspecified: Secondary | ICD-10-CM | POA: Diagnosis not present

## 2016-10-21 DIAGNOSIS — Z125 Encounter for screening for malignant neoplasm of prostate: Secondary | ICD-10-CM | POA: Diagnosis not present

## 2016-10-23 ENCOUNTER — Other Ambulatory Visit: Payer: Self-pay | Admitting: Gastroenterology

## 2016-10-25 DIAGNOSIS — Z23 Encounter for immunization: Secondary | ICD-10-CM | POA: Diagnosis not present

## 2016-10-25 DIAGNOSIS — D6489 Other specified anemias: Secondary | ICD-10-CM | POA: Diagnosis not present

## 2016-10-25 DIAGNOSIS — R040 Epistaxis: Secondary | ICD-10-CM | POA: Diagnosis not present

## 2016-10-25 DIAGNOSIS — Z0000 Encounter for general adult medical examination without abnormal findings: Secondary | ICD-10-CM | POA: Diagnosis not present

## 2016-10-25 DIAGNOSIS — R945 Abnormal results of liver function studies: Secondary | ICD-10-CM | POA: Diagnosis not present

## 2016-10-25 DIAGNOSIS — S069X0S Unspecified intracranial injury without loss of consciousness, sequela: Secondary | ICD-10-CM | POA: Diagnosis not present

## 2016-10-25 DIAGNOSIS — E784 Other hyperlipidemia: Secondary | ICD-10-CM | POA: Diagnosis not present

## 2016-10-25 DIAGNOSIS — Z1389 Encounter for screening for other disorder: Secondary | ICD-10-CM | POA: Diagnosis not present

## 2016-10-25 DIAGNOSIS — K279 Peptic ulcer, site unspecified, unspecified as acute or chronic, without hemorrhage or perforation: Secondary | ICD-10-CM | POA: Diagnosis not present

## 2016-10-25 DIAGNOSIS — R2689 Other abnormalities of gait and mobility: Secondary | ICD-10-CM | POA: Diagnosis not present

## 2016-10-25 DIAGNOSIS — J3089 Other allergic rhinitis: Secondary | ICD-10-CM | POA: Diagnosis not present

## 2016-10-25 DIAGNOSIS — R338 Other retention of urine: Secondary | ICD-10-CM | POA: Diagnosis not present

## 2016-10-25 DIAGNOSIS — Z6827 Body mass index (BMI) 27.0-27.9, adult: Secondary | ICD-10-CM | POA: Diagnosis not present

## 2016-11-29 ENCOUNTER — Other Ambulatory Visit: Payer: Self-pay | Admitting: Gastroenterology

## 2016-12-17 ENCOUNTER — Other Ambulatory Visit: Payer: Self-pay | Admitting: Gastroenterology

## 2017-03-23 ENCOUNTER — Encounter (HOSPITAL_COMMUNITY): Payer: Self-pay | Admitting: Emergency Medicine

## 2017-03-23 ENCOUNTER — Observation Stay (HOSPITAL_COMMUNITY): Payer: Medicare Other

## 2017-03-23 ENCOUNTER — Observation Stay (HOSPITAL_COMMUNITY)
Admission: EM | Admit: 2017-03-23 | Discharge: 2017-03-24 | Disposition: A | Discharge status: Home / Self Care | Payer: Medicare Other | Attending: Nephrology | Admitting: Nephrology

## 2017-03-23 DIAGNOSIS — Z888 Allergy status to other drugs, medicaments and biological substances status: Secondary | ICD-10-CM | POA: Diagnosis not present

## 2017-03-23 DIAGNOSIS — Z87891 Personal history of nicotine dependence: Secondary | ICD-10-CM | POA: Diagnosis not present

## 2017-03-23 DIAGNOSIS — R55 Syncope and collapse: Principal | ICD-10-CM | POA: Diagnosis present

## 2017-03-23 DIAGNOSIS — Z982 Presence of cerebrospinal fluid drainage device: Secondary | ICD-10-CM | POA: Diagnosis not present

## 2017-03-23 DIAGNOSIS — R339 Retention of urine, unspecified: Secondary | ICD-10-CM | POA: Insufficient documentation

## 2017-03-23 DIAGNOSIS — F0391 Unspecified dementia with behavioral disturbance: Secondary | ICD-10-CM | POA: Insufficient documentation

## 2017-03-23 DIAGNOSIS — Z79899 Other long term (current) drug therapy: Secondary | ICD-10-CM | POA: Insufficient documentation

## 2017-03-23 DIAGNOSIS — K219 Gastro-esophageal reflux disease without esophagitis: Secondary | ICD-10-CM | POA: Diagnosis not present

## 2017-03-23 DIAGNOSIS — Z886 Allergy status to analgesic agent status: Secondary | ICD-10-CM | POA: Insufficient documentation

## 2017-03-23 DIAGNOSIS — I959 Hypotension, unspecified: Secondary | ICD-10-CM | POA: Diagnosis not present

## 2017-03-23 DIAGNOSIS — Z8711 Personal history of peptic ulcer disease: Secondary | ICD-10-CM | POA: Insufficient documentation

## 2017-03-23 DIAGNOSIS — Z87820 Personal history of traumatic brain injury: Secondary | ICD-10-CM | POA: Insufficient documentation

## 2017-03-23 DIAGNOSIS — R031 Nonspecific low blood-pressure reading: Secondary | ICD-10-CM | POA: Diagnosis not present

## 2017-03-23 LAB — CBC
HEMATOCRIT: 42.5 % (ref 39.0–52.0)
Hemoglobin: 14.5 g/dL (ref 13.0–17.0)
MCH: 31.1 pg (ref 26.0–34.0)
MCHC: 34.1 g/dL (ref 30.0–36.0)
MCV: 91.2 fL (ref 78.0–100.0)
Platelets: 221 10*3/uL (ref 150–400)
RBC: 4.66 MIL/uL (ref 4.22–5.81)
RDW: 12.3 % (ref 11.5–15.5)
WBC: 10 10*3/uL (ref 4.0–10.5)

## 2017-03-23 LAB — BASIC METABOLIC PANEL
Anion gap: 10 (ref 5–15)
BUN: 11 mg/dL (ref 6–20)
CHLORIDE: 99 mmol/L — AB (ref 101–111)
CO2: 27 mmol/L (ref 22–32)
Calcium: 9.5 mg/dL (ref 8.9–10.3)
Creatinine, Ser: 0.92 mg/dL (ref 0.61–1.24)
GFR calc Af Amer: >60 mL/min (ref >60)
GLUCOSE: 110 mg/dL — AB (ref 65–99)
POTASSIUM: 4.5 mmol/L (ref 3.5–5.1)
Sodium: 136 mmol/L (ref 135–145)

## 2017-03-23 LAB — URINALYSIS, ROUTINE W REFLEX MICROSCOPIC
BACTERIA UA: NONE SEEN
Bilirubin Urine: NEGATIVE
Glucose, UA: NEGATIVE mg/dL
Hgb urine dipstick: NEGATIVE
KETONES UR: NEGATIVE mg/dL
Leukocytes, UA: NEGATIVE
Nitrite: NEGATIVE
PROTEIN: 100 mg/dL — AB
Specific Gravity, Urine: 1.017 (ref 1.005–1.030)
pH: 8 (ref 5.0–8.0)

## 2017-03-23 LAB — HEMOGLOBIN AND HEMATOCRIT, BLOOD
HCT: 44.9 % (ref 39.0–52.0)
HEMOGLOBIN: 15.2 g/dL (ref 13.0–17.0)

## 2017-03-23 LAB — I-STAT TROPONIN, ED: Troponin i, poc: 0 ng/mL (ref 0.00–0.08)

## 2017-03-23 LAB — CBG MONITORING, ED: GLUCOSE-CAPILLARY: 87 mg/dL (ref 65–99)

## 2017-03-23 LAB — TROPONIN I
Troponin I: <0.03 ng/mL (ref <0.03)
Troponin I: <0.03 ng/mL (ref <0.03)

## 2017-03-23 LAB — POC OCCULT BLOOD, ED: FECAL OCCULT BLD: NEGATIVE

## 2017-03-23 MED ORDER — ONDANSETRON HCL 4 MG/2ML IJ SOLN: 4.0000 mg | Freq: Four times a day (QID) | INTRAMUSCULAR | Status: DC | PRN

## 2017-03-23 MED ORDER — HYDRALAZINE HCL 20 MG/ML IJ SOLN: 10.0000 mg | Freq: Three times a day (TID) | INTRAMUSCULAR | Status: DC | PRN

## 2017-03-23 MED ORDER — LORAZEPAM 2 MG/ML IJ SOLN: 2.0000 mg | Freq: Four times a day (QID) | INTRAMUSCULAR | Status: DC | PRN

## 2017-03-23 MED ORDER — PANTOPRAZOLE SODIUM 40 MG PO TBEC
40.0000 mg | DELAYED_RELEASE_TABLET | Freq: Every day | ORAL | Status: DC
  Administered 2017-03-24: 40 mg via ORAL
  Filled 2017-03-23: qty 1

## 2017-03-23 MED ORDER — SODIUM CHLORIDE 0.9 % IV SOLN
INTRAVENOUS | Status: AC
  Administered 2017-03-23 – 2017-03-24 (×2): via INTRAVENOUS

## 2017-03-23 MED ORDER — LEVETIRACETAM 500 MG PO TABS
500.0000 mg | ORAL_TABLET | Freq: Two times a day (BID) | ORAL | Status: DC
  Administered 2017-03-23 – 2017-03-24 (×2): 500 mg via ORAL
  Filled 2017-03-23 (×2): qty 1

## 2017-03-23 MED ORDER — TAMSULOSIN HCL 0.4 MG PO CAPS
0.4000 mg | ORAL_CAPSULE | Freq: Every day | ORAL | Status: DC
  Administered 2017-03-24: 0.4 mg via ORAL
  Filled 2017-03-23: qty 1

## 2017-03-23 MED ORDER — CARBAMAZEPINE 200 MG PO TABS
600.0000 mg | ORAL_TABLET | Freq: Two times a day (BID) | ORAL | Status: DC
  Administered 2017-03-23 – 2017-03-24 (×2): 600 mg via ORAL
  Filled 2017-03-23 (×2): qty 3

## 2017-03-23 MED ORDER — ENOXAPARIN SODIUM 40 MG/0.4ML ~~LOC~~ SOLN: 40.0000 mg | SUBCUTANEOUS | Status: DC

## 2017-03-23 MED ORDER — ACETAMINOPHEN 325 MG PO TABS: 650.0000 mg | ORAL_TABLET | ORAL | Status: DC | PRN

## 2017-03-23 NOTE — ED Triage Notes (Signed)
Per EMS, patient coming from church and almost passed out sitting in pew.   Hypotensive at 60/30.  1 year ago, diagnosed with bleeding ulcer.  Given 400cc en route.  Afterwards, BP of 110/75.  71 HR, RR 16, 97% RA, and CBG 132.  No complaints other than generalized weakness.  Hx of TBI x 6 years. A/O x 4.  At baseline.  Speech pattern different at times and repeats self.  18G LFA.

## 2017-03-23 NOTE — ED Notes (Signed)
Unsuccessful attempt at giving report to 5W. 

## 2017-03-23 NOTE — ED Provider Notes (Signed)
[facility]-EMERGENCY DEPT Provider Note   CSN: [id number] Arrival date & time: 03/23/17  1310     History   Chief Complaint Chief Complaint  Patient presents with  . Near Syncope    HPI Zachary Thornton is a 51 y.o. male.  51 year old patient with a history of traumatic brain injury after a fall status post craniotomy with plate and VP shunt any history of bleeding duodenal ulcer which required coil embolization with VIR. Patient currently lives with his mother who is his legal guardian and caretaker. She reports that he typically has facility to complete all his ADLs. She does note that he does not ever complain of symptoms, including during his prior episode of GI bleeding. Patient presents today with concern for syncope. Mother reports he was at church in his normal state of health when she noticed that he was slumped over on the pew and that "his eyes looked abnormal". He perked up slightly from this state and then again slumped over at which time EMS who was already present was notified. On EMS evaluation he was reportedly significantly hypotensive with blood pressure 60/30, CBG was within normal limits. Patient received small bolus in the ED and returned back to his baseline mental status. On my interview the patient was not complaining of any abdominal pain, chest pain, shortness of breath, headache.  Patient's mother reports that he takes all his medications regularly, medication list was reviewed and he is on several prophylactic antiseizure medications. Mother denies any seizure-like activity during his recent syncopal episode. He is not on any antihypertensives except for tamsulosin. He had a significant GI bleed one year ago and presented just prior to that event with similar syncopal episode. She reports that she has not noticed any bloody stool or black stool but reports that she has not been checking and the patient is on like to report any such symptoms. Does endorse his appetite is changed  some recently and that he is eating less than his usual.      Past Medical History:  Diagnosis Date  . Back injury   . Blood loss anemia   . Dementia with behavioral disturbance   . Duodenal ulcer hemorrhage   . GI bleed   . TBI (traumatic brain injury) [facility])     Patient Active Problem List   Diagnosis Date Noted  . Bleeding duodenal ulcer   . Hypokalemia   . Lactic acidosis   . Duodenal ulcer hemorrhage 10/31/2015  . Urinary retention 10/30/2015  . Acute blood loss anemia 10/29/2015  . Hypotension 10/29/2015  . Syncope and collapse 10/29/2015  . Traumatic brain injury with depressed frontal skull fracture (HCC) 09/07/2012  . Cognitive deficit as late effect of traumatic brain injury (HCC) 09/07/2012  . Episodic dyscontrol syndrome 09/07/2012    Past Surgical History:  Procedure Laterality Date  . CRANIOTOMY     Post TBI craniotomy and plate insertion  . CSF SHUNT    . ESOPHAGOGASTRODUODENOSCOPY N/A 11/01/2015   Procedure: ESOPHAGOGASTRODUODENOSCOPY (EGD);  Surgeon: Ruffin Frederick, MD;  Location: [facility];  Service: Gastroenterology;  Laterality: N/A;  . ESOPHAGOGASTRODUODENOSCOPY (EGD) WITH PROPOFOL N/A 10/30/2015   Procedure: ESOPHAGOGASTRODUODENOSCOPY (EGD) WITH PROPOFOL;  Surgeon: Rachael Fee, MD;  Location: [facility];  Service: Endoscopy;  Laterality: N/A;    Home Medications    Prior to Admission medications   Medication Sig Start Date End Date Taking? Authorizing Provider  carbamazepine (TEGRETOL) 200 MG tablet Take 600 mg by mouth 2 (two) times daily.  Historical Provider, MD  cholecalciferol (VITAMIN D) 1000 UNITS tablet Take 1,000 Units by mouth daily.    Historical Provider, MD  escitalopram (LEXAPRO) 20 MG tablet Take 20 mg by mouth daily.    Historical Provider, MD  levETIRAcetam (KEPPRA) 500 MG tablet Take 1 tablet (500 mg total) by mouth 2 (two) times daily. 04/29/16   Lindalou Hose, MD  Multiple Vitamin (MULTIVITAMIN WITH MINERALS)  TABS Take 1 tablet by mouth daily.    Historical Provider, MD  pantoprazole (PROTONIX) 40 MG tablet TAKE 1 TABLET TWICE DAILY. 12/17/16   Rachael Fee, MD  tamsulosin [facility]) 0.4 MG CAPS capsule Take 1 capsule (0.4 mg total) by mouth daily after breakfast. 11/07/15   Rhetta Mura, MD   Family History Family History  Problem Relation Age of Onset  . Heart disease Father     MVR  . Hypertension Mother   . Stroke Maternal Aunt    Social History Social History  Substance Use Topics  . Smoking status: Former Games developer  . Smokeless tobacco: Never Used  . Alcohol use No   Allergies   Nuedexta [dextromethorphan-quinidine] and Nsaids   Review of Systems Review of Systems  Unable to perform ROS: Other (TBI, pt reports negative to all questions.)   Physical Exam Updated Vital Signs BP 124/89 (BP Location: Right Arm)   Pulse 73   Temp 97.7 F (36.5 C) (Oral)   Resp 16   Ht  (1.778 m)   Wt 85.3 kg   SpO2 98%   BMI 26.98 kg/m   Physical Exam  Constitutional: He appears well-developed and well-nourished. No distress.  HENT:  Head: Normocephalic and atraumatic.  Eyes: Conjunctivae are normal.  Neck: Neck supple.  Cardiovascular: Normal rate, regular rhythm and intact distal pulses.   No murmur heard. Pulmonary/Chest: Effort normal and breath sounds normal. No respiratory distress. He has no wheezes. He has no rales. He exhibits no tenderness.  Abdominal: Soft. Bowel sounds are normal. He exhibits no distension. There is no tenderness. There is no guarding.  Genitourinary: Rectum normal. Rectal exam shows no mass and no tenderness. Prostate is enlarged (mild).  Musculoskeletal: He exhibits no edema.  Neurological: He is alert.  Skin: Skin is warm and dry. He is not diaphoretic.  Psychiatric: He has a normal mood and affect.  Nursing note and vitals reviewed.  ED Treatments / Results  Labs (all labs ordered are listed, but only abnormal results are  displayed) Labs Reviewed  BASIC METABOLIC PANEL - Abnormal; Notable for the following:       Result Value   Chloride 99 (*)    Glucose, Bld 110 (*)    All other components within normal limits  URINALYSIS, ROUTINE W REFLEX MICROSCOPIC - Abnormal; Notable for the following:    Color, Urine AMBER (*)    APPearance CLOUDY (*)    Protein, ur 100 (*)    Squamous Epithelial / LPF 0-5 (*)    All other components within normal limits  CBC  CBG MONITORING, ED  I-STAT TROPOININ, ED  POC OCCULT BLOOD, ED    EKG  EKG Interpretation  Date/Time:  Sunday March 23 2017 13:22:34 EDT Ventricular Rate:  75 PR Interval:    QRS Duration: 98 QT Interval:  386 QTC Calculation: 432 R Axis:   -46 Text Interpretation:  Sinus rhythm Prolonged PR interval LAD, consider left anterior fascicular block Normal sinus rhythm Confirmed by Corlis Leak, COURTNEY ([id number]) on 03/23/2017 2:01:10 PM  Radiology No results found.  Procedures Procedures (including critical care time)  Medications Ordered in ED Medications - No data to display   Initial Impression / Assessment and Plan / ED Course  I have reviewed the triage vital signs and the nursing notes.  Pertinent labs & imaging results that were available during my care of the patient were reviewed by me and considered in my medical decision making (see chart for details).  Clinical Course as of Mar 23 1504  Sun Mar 23, 2017  1424 Pt evaluated and in NAD. BP stable and normotensive, not tachycardic, no resp distress. Mother is concerned about bleeding ulcer given hx and similar presentation.  [BS]  1425 Hgb wnl. Hemoglobin: 14.5 [BS]  1425 Low suspicion for active GIB. Fecal Occult Blood, POC: NEGATIVE [BS]  1426 No h/o DM or hypoglycemic meds. Glucose-Capillary: 87 [BS]  1428 Negative w/o ischemic EKG changes. Mild 1st deg heart block Troponin i, poc: 0.00 [BS]  1448 Pt with syncope found to be hypotensive. W/u negative on initial labs. Would  recommend admit for observation and cardiac monitoring.  [BS]    Clinical Course User Index [BS] Carolynn Comment, MD   51 year old gentleman with a history of carotid brain injury status post VP shunt history of serious duodenal ulcer bleeding which required embolization of VIR presents with syncopal episode at church. He was found to have hypotension with blood pressure 60/30. His temperature resolved right emergency department screening laboratory evaluation was initially negative. FOBT was also negative initially. The pt had 1 prior episode of syncope which preceded a massive GIB that required a 3 week hospital stay. Given the patient's inability to provide good history, we feel that it is reasonable to admit the patient for observation and cardiac monitoring.  Final Clinical Impressions(s) / ED Diagnoses   Final diagnoses:  Syncope, unspecified syncope type    New Prescriptions New Prescriptions   No medications on file     Carolynn Comment, MD 03/23/17 1510    Courteney Lyn Corlis Leak, MD 03/24/17 [phone]

## 2017-03-23 NOTE — H&P (Signed)
[facility] History and Physical  Azzam Mehra ZOX:[id number] DOB: 08-22-1966 DOA: 03/23/2017  Referring physician:  PCP: [name], MD   Chief Complaint: "I was sleep."  HPI: Shadman Tozzi is a 51 y.o. male  is no history of traumatic brain injury, duodenal ulcer, dementia and intraventricular shunt presents to the hospital after syncope at church. Patient states he was in his normal state of health this morning. Patient is unsure of cause syncope. Patient has not had any chest pain shortness of breath. Patient is eating and drinking normally. No nausea vomiting diarrhea. Has been taking medications normally. Patient was found to be hypotensive in the field was given a bolus of saline by EMS and patient's blood pressure responded.  ED course: EKG did not show STEMI. Initial troponin negative. Head CT negative.    Review of Systems:  As per HPI otherwise 10 point review of systems negative.    Past Medical History:  Diagnosis Date  . Back injury   . Blood loss anemia   . Dementia with behavioral disturbance   . Duodenal ulcer hemorrhage   . GI bleed   . TBI (traumatic brain injury) [facility])    Past Surgical History:  Procedure Laterality Date  . CRANIOTOMY     Post TBI craniotomy and plate insertion  . CSF SHUNT    . ESOPHAGOGASTRODUODENOSCOPY N/A 11/01/2015   Procedure: ESOPHAGOGASTRODUODENOSCOPY (EGD);  Surgeon: Ruffin Frederick, MD;  Location: [facility];  Service: Gastroenterology;  Laterality: N/A;  . ESOPHAGOGASTRODUODENOSCOPY (EGD) WITH PROPOFOL N/A 10/30/2015   Procedure: ESOPHAGOGASTRODUODENOSCOPY (EGD) WITH PROPOFOL;  Surgeon: Rachael Fee, MD;  Location: [facility];  Service: Endoscopy;  Laterality: N/A;   Social History:  reports that he has quit smoking. He has never used smokeless tobacco. He reports that he does not drink alcohol or use drugs.  Allergies  Allergen Reactions  . Nuedexta [Dextromethorphan-Quinidine] Anaphylaxis    Lips began to  swell  . Nsaids Other (See Comments)    Due to GI bleeding    Family History  Problem Relation Age of Onset  . Heart disease Father     MVR  . Hypertension Mother   . Stroke Maternal Aunt      Prior to Admission medications   Medication Sig Start Date End Date Taking? Authorizing Provider  carbamazepine (TEGRETOL) 200 MG tablet Take 600 mg by mouth 2 (two) times daily.   Yes Historical Provider, MD  cholecalciferol (VITAMIN D) 1000 UNITS tablet Take 1,000 Units by mouth daily.   Yes Historical Provider, MD  escitalopram (LEXAPRO) 20 MG tablet Take 20 mg by mouth daily.   Yes Historical Provider, MD  levETIRAcetam (KEPPRA) 500 MG tablet Take 1 tablet (500 mg total) by mouth 2 (two) times daily. 04/29/16  Yes Lindalou Hose, MD  Multiple Vitamin (MULTIVITAMIN WITH MINERALS) TABS Take 1 tablet by mouth daily.   Yes Historical Provider, MD  pantoprazole (PROTONIX) 40 MG tablet TAKE 1 TABLET TWICE DAILY. 12/17/16  Yes Rachael Fee, MD  tamsulosin [facility]) 0.4 MG CAPS capsule Take 1 capsule (0.4 mg total) by mouth daily after breakfast. 11/07/15  Yes Rhetta Mura, MD   Physical Exam: Vitals:   03/23/17 1315 03/23/17 1328  BP:  124/89  Pulse:  73  Resp:  16  Temp:  97.7 F (36.5 C)  TempSrc:  Oral  SpO2:  98%  Weight: 85.3 kg (188 lb)   Height:  (1.778 m)     Wt Readings from Last 3 Encounters:  03/23/17 85.3 kg (188 lb)  09/26/16 85.3 kg (188 lb)  06/18/16 83.5 kg (184 lb)    General:  Appears calm and comfortable, Alert and oriented 3 Eyes:  PERRL, EOMI, normal lids, iris ENT:  grossly normal hearing, lips & tongue Neck:  no LAD, masses or thyromegaly Cardiovascular:  RRR, no m/r/g. No LE edema.  Respiratory:  CTA bilaterally, no w/r/r. Normal respiratory effort. Abdomen:  soft, ntnd Skin:  no rash or induration seen on limited exam Musculoskeletal:  grossly normal tone BUE/BLE Psychiatric:  grossly normal mood and affect, speech fluent and  appropriate Neurologic:  CN 2-12 grossly intact, moves all extremities in coordinated fashion.          Labs on Admission:  Basic Metabolic Panel:  Recent Labs Lab 03/23/17 1336  NA 136  K 4.5  CL 99*  CO2 27  GLUCOSE 110*  BUN 11  CREATININE 0.92  CALCIUM 9.5   Liver Function Tests: No results for input(s): AST, ALT, ALKPHOS, BILITOT, PROT, ALBUMIN in the last 168 hours. No results for input(s): LIPASE, AMYLASE in the last 168 hours. No results for input(s): AMMONIA in the last 168 hours. CBC:  Recent Labs Lab 03/23/17 1336  WBC 10.0  HGB 14.5  HCT 42.5  MCV 91.2  PLT 221   Cardiac Enzymes: No results for input(s): CKTOTAL, CKMB, CKMBINDEX, TROPONINI in the last 168 hours.  BNP (last 3 results) No results for input(s): BNP in the last 8760 hours.  ProBNP (last 3 results) No results for input(s): PROBNP in the last 8760 hours.   Serum creatinine: 0.92 mg/dL 13/08/65 7846 Estimated creatinine clearance: 99.2 mL/min  CBG:  Recent Labs Lab 03/23/17 1324  GLUCAP 87    Radiological Exams on Admission: No results found.   EKG: Independently reviewed. No stemi.  Assessment/Plan Principal Problem:   Syncope and collapse Active Problems:   Hypotension   Syncope  Syncope EEG ordered CT head ordered ECHO ordered Orthostatics neg Last epside due to GI bleed, will recheck hgb after fluids but guaiac neg Seizure precautions Ativan prn seizure - serial trop ordered, initial neg - prn EKG CP - ECHO ordered for AM - tele bed, cardiac monitoring - zofran prn for nausea  Low BP - check blood cx x2 - IVF  Chronic HA Tegretol & Keppra level Cont meds  Urinary retention Cont flomax  Mood Cont lexapro  GERD Cont PPI  Code Status: FULL DVT Prophylaxis: SCDs Family Communication: none available Disposition Plan: Pending Improvement  Status: obs tele  Haydee Salter, MD Family Medicine [facility] [email or web address] Password  [email or web address]

## 2017-03-23 NOTE — Progress Notes (Addendum)
Zachary Thornton [id number] Admission Data: 03/23/2017 6:09 PM Attending Provider: Haydee Salter, MD  WUJ:[id number],NWGN A, MD Consults/ Treatment Team:   Zachary Thornton is a 51 y.o. male patient admitted from ED awake, alert  & orientated  X 3,  Full Code, VSS - Blood pressure (!) 139/93, pulse (!) 108, temperature 98.1 F (36.7 C), resp. rate 20, height  (1.778 m), weight 85.3 kg (188 lb), SpO2 97 %.,no c/o shortness of breath, no c/o chest pain, no distress noted. Tele # 25 placed and pt is currently running:normal sinus rhythm.   IV site WDL:  forearm left, condition patent and no redness with a transparent dsg that's clean dry and intact.  Allergies:   Allergies  Allergen Reactions  . Nuedexta [Dextromethorphan-Quinidine] Anaphylaxis    Lips began to swell  . Nsaids Other (See Comments)    Due to GI bleeding     Past Medical History:  Diagnosis Date  . Back injury   . Blood loss anemia   . Dementia with behavioral disturbance   . Duodenal ulcer hemorrhage   . GI bleed   . TBI (traumatic brain injury) (HCC)     History:  obtained from ED Nurse. Tobacco/alcohol: denied none  Pt orientation to unit, room and routine. Information packet given to patient/family and safety video watched.  Pt cognitive level alterned due to TBI 6 years ago. Admission INP armband ID verified with patient/family, and in place. SR up x 2, fall risk assessment complete with Patient and family verbalizing understanding of risks associated with falls. Pt verbalizes an understanding of how to use the call bell and to call for help before getting out of bed.  Skin, clean-dry- intact without evidence of bruising, or skin tears.   No evidence of skin break down noted on exam. color normal, vascularity normal, no rashes or suspicious lesions, no evidence of bleeding or bruising, no lesions noted, no rash, no edema, temperature normal, texture normal, mobility and turgor normal    Will cont to monitor and assist  as needed.  Camillo Flaming, RN 03/23/2017 6:09 PM

## 2017-03-23 NOTE — ED Notes (Signed)
CBG 87. 

## 2017-03-24 ENCOUNTER — Observation Stay (HOSPITAL_COMMUNITY): Payer: Medicare Other

## 2017-03-24 ENCOUNTER — Observation Stay (HOSPITAL_BASED_OUTPATIENT_CLINIC_OR_DEPARTMENT_OTHER): Payer: Medicare Other

## 2017-03-24 DIAGNOSIS — R55 Syncope and collapse: Secondary | ICD-10-CM

## 2017-03-24 LAB — ECHOCARDIOGRAM COMPLETE
Height: 70 in
Weight: 3008 oz

## 2017-03-24 LAB — HIV ANTIBODY (ROUTINE TESTING W REFLEX): HIV SCREEN 4TH GENERATION: NONREACTIVE

## 2017-03-24 NOTE — Discharge Summary (Signed)
Physician Discharge Summary  Zachary Thornton NWG:[id number] DOB: 1966-07-28 DOA: 03/23/2017  PCP: [name], MD  Admit date: 03/23/2017 Discharge date: 03/24/2017  Admitted From:home Disposition:home  Recommendations for Outpatient Follow-up:  1. Follow up with PCP in 1-2 weeks 2. Please obtain BMP/CBC in one week   Home Health:no Equipment/Devices:no Discharge Condition:stable CODE STATUS:full Diet recommendation:heart healthy  Brief/Interim Summary:50 y/o male with h/o TBI, chronic encephalopathy, intraventricular shunt, duodenal ulcer presented with brief syncopal episode at church. Denied any symptoms before or afterwards. As per EMS, he was found to be hypotensive and given IVF with improvement in BP. In the hospital, his EKG, troponin, labs Unremarkable. CT head with no acute finding. EEG with no epileptic focus. ECHo with normal systolic function, normal wall motion. No orthostatic hypotension in the hospital. Clinically improved. Able to ambulate in the hallway/room without difficulties.   Discussed with the patient and his mother over the phone in detail. Pt eager to go home. Recommended outpatient follow up with PCP.  Discharge Diagnoses:  Principal Problem:   Syncope and collapse Active Problems:   Hypotension   Syncope    Discharge Instructions  Discharge Instructions    Call MD for:  difficulty breathing, headache or visual disturbances    Complete by:  As directed    Call MD for:  extreme fatigue    Complete by:  As directed    Call MD for:  hives    Complete by:  As directed    Call MD for:  persistant dizziness or light-headedness    Complete by:  As directed    Call MD for:  persistant nausea and vomiting    Complete by:  As directed    Call MD for:  severe uncontrolled pain    Complete by:  As directed    Call MD for:  temperature >100.4    Complete by:  As directed    Diet - low sodium heart healthy    Complete by:  As directed    Discharge  instructions    Complete by:  As directed    Please follow up with PCP in 1 week.   Increase activity slowly    Complete by:  As directed      Allergies as of 03/24/2017      Reactions   Nuedexta [dextromethorphan-quinidine] Anaphylaxis   Lips began to swell   Nsaids Other (See Comments)   Due to GI bleeding      Medication List    TAKE these medications   carbamazepine 200 MG tablet Commonly known as:  TEGRETOL Take 600 mg by mouth 2 (two) times daily.   cholecalciferol 1000 units tablet Commonly known as:  VITAMIN D Take 1,000 Units by mouth daily.   escitalopram 20 MG tablet Commonly known as:  LEXAPRO Take 20 mg by mouth daily.   levETIRAcetam 500 MG tablet Commonly known as:  KEPPRA Take 1 tablet (500 mg total) by mouth 2 (two) times daily.   multivitamin with minerals Tabs tablet Take 1 tablet by mouth daily.   pantoprazole 40 MG tablet Commonly known as:  PROTONIX TAKE 1 TABLET TWICE DAILY.   tamsulosin 0.4 MG Caps capsule Commonly known as:  FLOMAX Take 1 capsule (0.4 mg total) by mouth daily after breakfast.      Follow-up Information    PERINI,[name], MD. Schedule an appointment as soon as possible for a visit in 1 week(s).   Specialty:  Internal Medicine Contact information: [address] [place]  [address] [phone]          Allergies  Allergen Reactions  . Nuedexta [Dextromethorphan-Quinidine] Anaphylaxis    Lips began to swell  . Nsaids Other (See Comments)    Due to GI bleeding    Consultations: no  Procedures/Studies: eeg, echo  Subjective: Pt was seen and examined at bedside. He reported doing well and eager to go home. Denied headache, dizziness, chest pain, SOB, weakness or abdomen pain.  Discharge Exam: Vitals:   03/24/17 0351 03/24/17 0533  BP: 107/77 114/75  Pulse: 87 88  Resp: 18 18  Temp: 97.6 F (36.4 C) 97.6 F (36.4 C)   Vitals:   03/23/17 1708 03/23/17 2125 03/24/17 0351 03/24/17 0533  BP: (!)  139/93 140/86 107/77 114/75  Pulse: (!) 108 (!) 121 87 88  Resp: Temp: 98.1 F (36.7 C) 98.4 F (36.9 C) 97.6 F (36.4 C) 97.6 F (36.4 C)  TempSrc:  Oral  Oral  SpO2: 97% 97% 96% 96%  Weight:      Height:        General: Pt is alert, awake, not in acute distress Cardiovascular: RRR, S1/S2 +, no rubs, no gallops Respiratory: CTA bilaterally, no wheezing, no rhonchi Abdominal: Soft, NT, ND, bowel sounds + Extremities: no edema, no cyanosis    The results of significant diagnostics from this hospitalization (including imaging, microbiology, ancillary and laboratory) are listed below for reference.     Microbiology: Recent Results (from the past 240 hour(s))  Culture, blood (Routine X 2) w Reflex to ID Panel     Status: None (Preliminary result)   Collection Time: 03/23/17  5:48 PM  Result Value Ref Range Status   Specimen Description BLOOD RIGHT ANTECUBITAL  Final   Special Requests   Final    BOTTLES DRAWN AEROBIC AND ANAEROBIC Blood Culture adequate volume   Culture NO GROWTH < 24 HOURS  Final   Report Status PENDING  Incomplete  Culture, blood (Routine X 2) w Reflex to ID Panel     Status: None (Preliminary result)   Collection Time: 03/23/17  5:53 PM  Result Value Ref Range Status   Specimen Description BLOOD LEFT ANTECUBITAL  Final   Special Requests   Final    BOTTLES DRAWN AEROBIC ONLY Blood Culture adequate volume   Culture NO GROWTH < 24 HOURS  Final   Report Status PENDING  Incomplete     Labs: BNP (last 3 results) No results for input(s): BNP in the last 8760 hours. Basic Metabolic Panel:  Recent Labs Lab 03/23/17 1336  NA 136  K 4.5  CL 99*  CO2 27  GLUCOSE 110*  BUN 11  CREATININE 0.92  CALCIUM 9.5   Liver Function Tests: No results for input(s): AST, ALT, ALKPHOS, BILITOT, PROT, ALBUMIN in the last 168 hours. No results for input(s): LIPASE, AMYLASE in the last 168 hours. No results for input(s): AMMONIA in the last 168  hours. CBC:  Recent Labs Lab 03/23/17 1336 03/23/17 1753  WBC 10.0  --   HGB 14.5 15.2  HCT 42.5 44.9  MCV 91.2  --   PLT 221  --    Cardiac Enzymes:  Recent Labs Lab 03/23/17 1634 03/23/17 2117  TROPONINI <0.03 <0.03   BNP: Invalid input(s): POCBNP CBG:  Recent Labs Lab 03/23/17 1324  GLUCAP 87   D-Dimer No results for input(s): DDIMER in the last 72 hours. Hgb A1c No results for input(s): HGBA1C in the last 72 hours.  Lipid Profile No results for input(s): CHOL, HDL, LDLCALC, TRIG, CHOLHDL, LDLDIRECT in the last 72 hours. Thyroid function studies No results for input(s): TSH, T4TOTAL, T3FREE, THYROIDAB in the last 72 hours.  Invalid input(s): FREET3 Anemia work up No results for input(s): VITAMINB12, FOLATE, FERRITIN, TIBC, IRON, RETICCTPCT in the last 72 hours. Urinalysis    Component Value Date/Time   COLORURINE AMBER (A) 03/23/2017 1347   APPEARANCEUR CLOUDY (A) 03/23/2017 1347   LABSPEC 1.017 03/23/2017 1347   PHURINE 8.0 03/23/2017 1347   GLUCOSEU NEGATIVE 03/23/2017 1347   HGBUR NEGATIVE 03/23/2017 1347   BILIRUBINUR NEGATIVE 03/23/2017 1347   KETONESUR NEGATIVE 03/23/2017 1347   PROTEINUR 100 (A) 03/23/2017 1347   UROBILINOGEN 0.2 10/29/2015 2230   NITRITE NEGATIVE 03/23/2017 1347   LEUKOCYTESUR NEGATIVE 03/23/2017 1347   Sepsis Labs Invalid input(s): PROCALCITONIN,  WBC,  LACTICIDVEN Microbiology Recent Results (from the past 240 hour(s))  Culture, blood (Routine X 2) w Reflex to ID Panel     Status: None (Preliminary result)   Collection Time: 03/23/17  5:48 PM  Result Value Ref Range Status   Specimen Description BLOOD RIGHT ANTECUBITAL  Final   Special Requests   Final    BOTTLES DRAWN AEROBIC AND ANAEROBIC Blood Culture adequate volume   Culture NO GROWTH < 24 HOURS  Final   Report Status PENDING  Incomplete  Culture, blood (Routine X 2) w Reflex to ID Panel     Status: None (Preliminary result)   Collection Time: 03/23/17  5:53 PM   Result Value Ref Range Status   Specimen Description BLOOD LEFT ANTECUBITAL  Final   Special Requests   Final    BOTTLES DRAWN AEROBIC ONLY Blood Culture adequate volume   Culture NO GROWTH < 24 HOURS  Final   Report Status PENDING  Incomplete     Time coordinating discharge: 27 minutes  SIGNED:   Maxie Barb, MD  [facility] 03/24/2017, 2:14 PM  If 7PM-7AM, please contact night-coverage [email or web address] Password [email or web address]

## 2017-03-24 NOTE — Procedures (Signed)
ELECTROENCEPHALOGRAM REPORT  Date of Study: 03/24/2017  Patient's Name: Zachary Thornton MRN: [id number] Date of Birth: November 15, 1966  Referring Provider: Talmadge Coventry. Melynda Ripple, MD  Clinical History: 51 year old man with TBI and seizure disorder presents for syncope.  Medications: carbamazepine (TEGRETOL) tablet 600 mg  hydrALAZINE (APRESOLINE) injection 10 mg  levETIRAcetam (KEPPRA) tablet 500 mg  LORazepam (ATIVAN) injection 2 mg  ondansetron (ZOFRAN) injection 4 mg  pantoprazole (PROTONIX) EC tablet 40 mg  tamsulosin (FLOMAX) capsule 0.4 mg   Technical Summary: A multichannel digital EEG recording measured by the international 10-20 system with electrodes applied with paste and impedances below 5000 ohms performed in our laboratory with EKG monitoring in an awake and drowsy patient.  Hyperventilation and photic stimulation were not performed.  The digital EEG was referentially recorded, reformatted, and digitally filtered in a variety of bipolar and referential montages for optimal display.    Description: The patient is awake and drowsy during the recording.  During maximal wakefulness, there is a symmetric, medium voltage 10-11 Hz posterior dominant rhythm that attenuates with eye opening.  There is focal slowing over the right hemisphere.  During drowsiness, there is an increase in theta slowing of the background.  Stage 2 sleep was not seen.  There were no epileptiform discharges or electrographic seizures seen.    EKG lead was unremarkable.  Impression: This awake and drwosy EEG is abnormal due to mild right hemispheric slowing.  Clinical Correlation: The above findings are indicative of a structural or other physiologic abnormality in the region of the right hemisphere.  Clinical correlation is advised.  Shon Millet, DO

## 2017-03-24 NOTE — Progress Notes (Signed)
Zachary Thornton to be D/C'd Home per MD order.  Discussed with the patient & Mother, and all questions fully answered.  VSS, Skin clean, dry and intact without evidence of skin break down, no evidence of skin tears noted. IV catheter discontinued intact. Site without signs and symptoms of complications. Dressing and pressure applied.  An After Visit Summary was printed and given to the patient. Patient received prescription.  D/c education completed with patient/family including follow up instructions, medication list, d/c activities limitations if indicated, with other d/c instructions as indicated by MD - patient able to verbalize understanding, all questions fully answered.   Patient instructed to return to ED, call 911, or call MD for any changes in condition.   Patient escorted via WC, and D/C home via private auto.  [name] C 03/24/2017 4:58 PM

## 2017-03-24 NOTE — Plan of Care (Signed)
Problem: Safety: Goal: Ability to remain free from injury will improve Outcome: Not Progressing Traumatic brain injury

## 2017-03-24 NOTE — Progress Notes (Addendum)
Dr. Ronalee Belts made aware echo results are back for his review. Verbal order to discharge patient to home.

## 2017-03-24 NOTE — Progress Notes (Signed)
  Echocardiogram 2D Echocardiogram has been performed.  Zachary Thornton 03/24/2017, 3:14 PM

## 2017-03-24 NOTE — Progress Notes (Signed)
EEG Completed; Results Pending  

## 2017-03-24 NOTE — Progress Notes (Signed)
ECHO called to make aware imminent discharge

## 2017-03-25 LAB — LEVETIRACETAM LEVEL: Levetiracetam Lvl: 7.7 ug/mL — ABNORMAL LOW (ref 10.0–40.0)

## 2017-03-27 LAB — CARBAMAZEPINE, FREE AND TOTAL
CARBAMAZEPINE FREE: 1.8 ug/mL (ref 0.6–4.2)
Carbamazepine, Total: 11.4 ug/mL (ref 4.0–12.0)

## 2017-03-28 LAB — CULTURE, BLOOD (ROUTINE X 2)
CULTURE: NO GROWTH
CULTURE: NO GROWTH
SPECIAL REQUESTS: ADEQUATE
Special Requests: ADEQUATE

## 2017-04-08 DIAGNOSIS — R569 Unspecified convulsions: Secondary | ICD-10-CM | POA: Diagnosis not present

## 2017-04-08 DIAGNOSIS — Z6826 Body mass index (BMI) 26.0-26.9, adult: Secondary | ICD-10-CM | POA: Diagnosis not present

## 2017-04-08 DIAGNOSIS — J3089 Other allergic rhinitis: Secondary | ICD-10-CM | POA: Diagnosis not present

## 2017-04-08 DIAGNOSIS — D6489 Other specified anemias: Secondary | ICD-10-CM | POA: Diagnosis not present

## 2017-04-08 DIAGNOSIS — S069X0S Unspecified intracranial injury without loss of consciousness, sequela: Secondary | ICD-10-CM | POA: Diagnosis not present

## 2017-04-29 ENCOUNTER — Ambulatory Visit: Payer: Medicare Other | Admitting: Neurology

## 2017-04-29 DIAGNOSIS — Z029 Encounter for administrative examinations, unspecified: Secondary | ICD-10-CM

## 2017-05-02 ENCOUNTER — Encounter: Payer: Self-pay | Admitting: Neurology

## 2017-06-20 DIAGNOSIS — R569 Unspecified convulsions: Secondary | ICD-10-CM | POA: Diagnosis not present

## 2017-06-20 DIAGNOSIS — Z1389 Encounter for screening for other disorder: Secondary | ICD-10-CM | POA: Diagnosis not present

## 2017-06-20 DIAGNOSIS — R338 Other retention of urine: Secondary | ICD-10-CM | POA: Diagnosis not present

## 2017-06-20 DIAGNOSIS — Z6824 Body mass index (BMI) 24.0-24.9, adult: Secondary | ICD-10-CM | POA: Diagnosis not present

## 2017-06-20 DIAGNOSIS — F418 Other specified anxiety disorders: Secondary | ICD-10-CM | POA: Diagnosis not present

## 2017-06-20 DIAGNOSIS — R2689 Other abnormalities of gait and mobility: Secondary | ICD-10-CM | POA: Diagnosis not present

## 2017-06-20 DIAGNOSIS — K279 Peptic ulcer, site unspecified, unspecified as acute or chronic, without hemorrhage or perforation: Secondary | ICD-10-CM | POA: Diagnosis not present

## 2017-07-01 ENCOUNTER — Encounter: Payer: Self-pay | Admitting: Neurology

## 2017-07-01 ENCOUNTER — Ambulatory Visit (INDEPENDENT_AMBULATORY_CARE_PROVIDER_SITE_OTHER): Payer: Medicare Other | Admitting: Neurology

## 2017-07-01 VITALS — BP 124/70 | HR 92 | Ht 70.0 in | Wt 177.0 lb

## 2017-07-01 DIAGNOSIS — S069X5D Unspecified intracranial injury with loss of consciousness greater than 24 hours with return to pre-existing conscious level, subsequent encounter: Secondary | ICD-10-CM | POA: Diagnosis not present

## 2017-07-01 DIAGNOSIS — R55 Syncope and collapse: Secondary | ICD-10-CM | POA: Diagnosis not present

## 2017-07-01 DIAGNOSIS — R569 Unspecified convulsions: Secondary | ICD-10-CM | POA: Diagnosis not present

## 2017-07-01 NOTE — Patient Instructions (Signed)
I don't think this recent event was a seizure.  I wouldn't make any changes to the Tegretol or Keppra at this time Follow up in 6 months.

## 2017-07-01 NOTE — Progress Notes (Signed)
NEUROLOGY FOLLOW UP OFFICE NOTE  Zachary Thornton [id number]  HISTORY OF PRESENT ILLNESS: Zachary Thornton is a 51 year old man with TBI and history of GI bleed who follows up for seizure.  History supplemented by a family friend who accompanies him.  I also spoke to his mother via phone regarding the event in April.   UPDATE: He takes Keppra 500mg  twice daily.  He also takes Tegretol 600mg  twice daily for mood.   He was admitted to [facility] from 03/23/17 to 03/24/17 for syncope.  He had a brief syncopal episode at church.  His face became pale.  He did not exhibit seizure-like activity.  It was brief.  He was found to be hypotensive by EMS with a BP of 60/30 and was given IV fluids, which improved blood pressure.  He was not orthostatic in the hospital.  Work up in the hospital included head CT with no acute findings, EEG with right hemispheric slowing (which could be due to underlying physiologic abnormality from encephalomalacia and surgery), but no epileptiform discharges, and unremarkable EKG and troponins.  03/23/17 LABS: CBC with WBC 10, HGB 14.5, HCT 42.5, PLT 221; BMP with Na 136, K 4.5, Cl 99, CO2 27, glucose 110, BUN 11, Cr 0.92; Carbamazepine level 1.8, levetiracetam level 7.7, HIV nonreactive.   HISTORY: He had a traumatic brain injury in 2012 after falling and hitting the back of his head on concrete.  He fractured his skull and sustained intracranial bleeding, requiring right craniectomy and VP shunt. Zachary Thornton  He was in a coma for 6 weeks.  After 2 month hospitalization, he was discharged to inpatient rehab facility where he had to relearn how to walk, talk and feed himself.  His wife was unable to care for him and he subsequently moved in with his mother, who is his guardian.  His wife and son live in [place].   He was hospitalized in December for GI bleed due to duodenal ulcer, requiring multiple blood transfusions.   He presented to the ED on 04/29/16 following an  unwitnessed fall in which he hit his head.  He did not recall the event.  His mother heard him fall in the bathroom and found him on the floor with eyes rolled back, unresponsive with flexed posturing and shaking of both upper extremities, lasting about 2 minutes.  He had bowel and bladder incontinence.  He was slightly confused afterwards for about 10 minutes.  He did not exhibit foaming at the mouth or tongue biting.  CT of head was personally reviewed and showed chronic changes of traumatic brain injury with bifrontal and temporal encephalomalacia, as well as post surgical changes of right craniectomy with cranioplasty and left frontal approach VP shunt.  No evidence of hydrocephalus seen.  He was started on Keppra.  He has not had any recurrent spells.  He was found to have worsening anemia and received further transfusions by his PCP.  He does not drive.  He has no prior history of seizures.  PAST MEDICAL HISTORY: Past Medical History:  Diagnosis Date  . Back injury   . Blood loss anemia   . Dementia with behavioral disturbance   . Duodenal ulcer hemorrhage   . GI bleed   . TBI (traumatic brain injury) [facility])     MEDICATIONS: Current Outpatient Prescriptions on File Prior to Visit  Medication Sig Dispense Refill  . carbamazepine (TEGRETOL) 200 MG tablet Take 600 mg by mouth 2 (two) times daily.    Zachary Thornton  cholecalciferol (VITAMIN D) 1000 UNITS tablet Take 1,000 Units by mouth daily.    Zachary Thornton escitalopram (LEXAPRO) 20 MG tablet Take 20 mg by mouth daily.    Zachary Thornton levETIRAcetam (KEPPRA) 500 MG tablet Take 1 tablet (500 mg total) by mouth 2 (two) times daily. 60 tablet 0  . Multiple Vitamin (MULTIVITAMIN WITH MINERALS) TABS Take 1 tablet by mouth daily.    . pantoprazole (PROTONIX) 40 MG tablet TAKE 1 TABLET TWICE DAILY. 60 tablet 0  . tamsulosin (FLOMAX) 0.4 MG CAPS capsule Take 1 capsule (0.4 mg total) by mouth daily after breakfast. 30 capsule 0   No current facility-administered medications on file  prior to visit.     ALLERGIES: Allergies  Allergen Reactions  . Nuedexta [Dextromethorphan-Quinidine] Anaphylaxis    Lips began to swell  . Nsaids Other (See Comments)    Due to GI bleeding    FAMILY HISTORY: Family History  Problem Relation Age of Onset  . Heart disease Father        MVR  . Hypertension Mother   . Stroke Maternal Aunt     SOCIAL HISTORY: Social History   Social History  . Marital status: Married    Spouse name: N/A  . Number of children: 1  . Years of education: N/A   Occupational History  . Disabled    Social History Main Topics  . Smoking status: Former Games developer  . Smokeless tobacco: Never Used  . Alcohol use No  . Drug use: No  . Sexual activity: Yes   Other Topics Concern  . Not on file   Social History Narrative   Lives with his mother.  Normally ambulates without assistance.      REVIEW OF SYSTEMS: Constitutional: No fevers, chills, or sweats, no generalized fatigue, change in appetite Eyes: No visual changes, double vision, eye pain Ear, nose and throat: No hearing loss, ear pain, nasal congestion, sore throat Cardiovascular: No chest pain, palpitations Respiratory:  No shortness of breath at rest or with exertion, wheezes GastrointestinaI: No nausea, vomiting, diarrhea, abdominal pain, fecal incontinence Genitourinary:  No dysuria, urinary retention or frequency Musculoskeletal:  No neck pain, back pain Integumentary: No rash, pruritus, skin lesions Neurological: as above Psychiatric: No depression, insomnia, anxiety Endocrine: No palpitations, fatigue, diaphoresis, mood swings, change in appetite, change in weight, increased thirst Hematologic/Lymphatic:  No purpura, petechiae. Allergic/Immunologic: no itchy/runny eyes, nasal congestion, recent allergic reactions, rashes  PHYSICAL EXAM: Vitals:   07/01/17 1038  BP: 124/70  Pulse: 92   General: No acute distress.  Patient appears well-groomed.  normal body habitus. Head:   Normocephalic/atraumatic Eyes:  Fundi examined but not visualized Neck: supple, no paraspinal tenderness, full range of motion Heart:  Regular rate and rhythm Lungs:  Clear to auscultation bilaterally Back: No paraspinal tenderness Neurological Exam: alert and oriented to person, place, and time. Attention span and concentration fair, recent and remote memory intact, fund of knowledge intact.  Speech fluent and not dysarthric, language intact.  CN II-XII intact. Bulk and tone normal, muscle strength 5/5 throughout.  Sensation to light touch  intact.  Deep tendon reflexes 2+ throughout.  Finger to nose testing intact.  Left hemiplegic gait.  Romberg with sway.  IMPRESSION: I  Syncope.  Recent episode is consistent with syncope secondary to hypotension and not seizure. II  Seizure vs convulsive syncope.  The event in May 2017 could potentially have been convulsive syncope (especially in light of recent syncopal episode with hypotension.  However, given the incontinence, postictal  confusion, and prior head trauma (which could be a seizure focus), I would treat for seizures.  PLAN: 1.  Keppra 500mg  twice daily and Tegretol 600mg  twice daily. 2.  Defer to Dr. Waynard EdwardsPerini regarding further workup for syncope and hypotension. 3.  Follow up in 6 months.  18 minutes spent face to face with patient, over 50% spent discussing diagnosis and management.  Shon MilletAdam Jaffe, DO  CC: Rodrigo RanMark Perini, MD

## 2017-07-04 DIAGNOSIS — H04123 Dry eye syndrome of bilateral lacrimal glands: Secondary | ICD-10-CM | POA: Diagnosis not present

## 2017-07-04 DIAGNOSIS — H524 Presbyopia: Secondary | ICD-10-CM | POA: Diagnosis not present

## 2017-12-10 ENCOUNTER — Emergency Department (HOSPITAL_COMMUNITY)
Admission: EM | Admit: 2017-12-10 | Discharge: 2017-12-10 | Disposition: A | Discharge status: Home / Self Care | Payer: Medicare Other | Attending: Emergency Medicine | Admitting: Emergency Medicine

## 2017-12-10 ENCOUNTER — Emergency Department (HOSPITAL_COMMUNITY): Payer: Medicare Other

## 2017-12-10 ENCOUNTER — Encounter (HOSPITAL_COMMUNITY): Payer: Self-pay | Admitting: Emergency Medicine

## 2017-12-10 DIAGNOSIS — R569 Unspecified convulsions: Secondary | ICD-10-CM

## 2017-12-10 DIAGNOSIS — F0391 Unspecified dementia with behavioral disturbance: Secondary | ICD-10-CM | POA: Insufficient documentation

## 2017-12-10 DIAGNOSIS — R9431 Abnormal electrocardiogram [ECG] [EKG]: Secondary | ICD-10-CM | POA: Diagnosis not present

## 2017-12-10 DIAGNOSIS — Z79899 Other long term (current) drug therapy: Secondary | ICD-10-CM | POA: Diagnosis not present

## 2017-12-10 DIAGNOSIS — Z87891 Personal history of nicotine dependence: Secondary | ICD-10-CM | POA: Insufficient documentation

## 2017-12-10 DIAGNOSIS — R410 Disorientation, unspecified: Secondary | ICD-10-CM | POA: Diagnosis not present

## 2017-12-10 HISTORY — DX: Unspecified convulsions: R56.9

## 2017-12-10 LAB — MAGNESIUM: Magnesium: 2 mg/dL (ref 1.7–2.4)

## 2017-12-10 LAB — CBC WITH DIFFERENTIAL/PLATELET
BASOS PCT: 0 %
Basophils Absolute: 0 10*3/uL (ref 0.0–0.1)
Eosinophils Absolute: 0.1 10*3/uL (ref 0.0–0.7)
Eosinophils Relative: 2 %
HEMATOCRIT: 42.1 % (ref 39.0–52.0)
HEMOGLOBIN: 14.9 g/dL (ref 13.0–17.0)
Lymphocytes Relative: 13 %
Lymphs Abs: 1.1 10*3/uL (ref 0.7–4.0)
MCH: 32.5 pg (ref 26.0–34.0)
MCHC: 35.4 g/dL (ref 30.0–36.0)
MCV: 91.9 fL (ref 78.0–100.0)
MONOS PCT: 4 %
Monocytes Absolute: 0.4 10*3/uL (ref 0.1–1.0)
NEUTROS ABS: 6.8 10*3/uL (ref 1.7–7.7)
NEUTROS PCT: 81 %
Platelets: 236 10*3/uL (ref 150–400)
RBC: 4.58 MIL/uL (ref 4.22–5.81)
RDW: 12 % (ref 11.5–15.5)
WBC: 8.4 10*3/uL (ref 4.0–10.5)

## 2017-12-10 LAB — CARBAMAZEPINE LEVEL, TOTAL: Carbamazepine Lvl: 12.6 ug/mL — ABNORMAL HIGH (ref 4.0–12.0)

## 2017-12-10 LAB — BASIC METABOLIC PANEL
ANION GAP: 8 (ref 5–15)
BUN: 10 mg/dL (ref 6–20)
CHLORIDE: 97 mmol/L — AB (ref 101–111)
CO2: 26 mmol/L (ref 22–32)
CREATININE: 0.7 mg/dL (ref 0.61–1.24)
Calcium: 9.5 mg/dL (ref 8.9–10.3)
GFR calc non Af Amer: >60 mL/min (ref >60)
Glucose, Bld: 118 mg/dL — ABNORMAL HIGH (ref 65–99)
Potassium: 4 mmol/L (ref 3.5–5.1)
Sodium: 131 mmol/L — ABNORMAL LOW (ref 135–145)

## 2017-12-10 LAB — CBG MONITORING, ED: GLUCOSE-CAPILLARY: 104 mg/dL — AB (ref 65–99)

## 2017-12-10 LAB — URINALYSIS, ROUTINE W REFLEX MICROSCOPIC
Bilirubin Urine: NEGATIVE
Glucose, UA: NEGATIVE mg/dL
Hgb urine dipstick: NEGATIVE
Ketones, ur: NEGATIVE mg/dL
Leukocytes, UA: NEGATIVE
NITRITE: NEGATIVE
Protein, ur: NEGATIVE mg/dL
SPECIFIC GRAVITY, URINE: 1 — AB (ref 1.005–1.030)
pH: 6 (ref 5.0–8.0)

## 2017-12-10 MED ORDER — LEVETIRACETAM 500 MG/5ML IV SOLN
1000.0000 mg | Freq: Once | INTRAVENOUS | Status: AC
  Administered 2017-12-10: 1000 mg via INTRAVENOUS
  Filled 2017-12-10: qty 10

## 2017-12-10 MED ORDER — LORAZEPAM 2 MG/ML IJ SOLN
1.0000 mg | Freq: Once | INTRAMUSCULAR | Status: AC
  Administered 2017-12-10: 1 mg via INTRAVENOUS
  Filled 2017-12-10: qty 1

## 2017-12-10 MED ORDER — LEVETIRACETAM 500 MG PO TABS: 1000.0000 mg | ORAL_TABLET | Freq: Two times a day (BID) | ORAL | 0 refills | Status: DC

## 2017-12-10 MED ORDER — SODIUM CHLORIDE 0.9 % IV BOLUS (SEPSIS)
1000.0000 mL | Freq: Once | INTRAVENOUS | Status: AC
  Administered 2017-12-10: 1000 mL via INTRAVENOUS

## 2017-12-10 NOTE — ED Notes (Signed)
ED Provider at bedside. 

## 2017-12-10 NOTE — ED Triage Notes (Signed)
[facility]. Patient has been compliant with medications. 22G left AC. Patient has caregiver at bedside and confusion per baseline.

## 2017-12-10 NOTE — Discharge Instructions (Signed)
We are going to increase your Keppra from 500 mg twice a day to 1000 mg twice a day.  This will be 2 pills instead of 1 pill, twice a day.  You can start this tonight.  I have given you a new prescription so that you do not run out of medications.

## 2017-12-10 NOTE — ED Provider Notes (Addendum)
[facility]-EMERGENCY DEPT Provider Note   CSN: [id number] Arrival date & time: 12/10/17  1339     History   Chief Complaint Chief Complaint  Patient presents with  . Seizures    HPI Zachary LoosenScott Thornton is a 51 y.o. male.  HPI   51 year old male with past medical history as below including TBI with subsequent seizure disorder who presents with breakthrough seizures.  The patient has reportedly been in his usual state of health the last several days.  He was at a dinner and movie with his caregiver when he reportedly stared off, became unresponsive, then had generalized tonic-clonic shaking activity.  He was gently lowered to the ground.  He then had to additional episodes with in between areas of confusion and lethargy.  He is now back to his baseline.  He lost urine continence.  Denies any tongue biting.  He currently denies any complaints.  Of note, he was previous on Keppra and Tegretol but is no longer taking Keppra.  He does not recall why.  Denies any recent falls.  No recent head trauma.  No recent other medication changes.  Denies any blood in his stools.  No melena.  Past Medical History:  Diagnosis Date  . Back injury   . Blood loss anemia   . Dementia with behavioral disturbance   . Duodenal ulcer hemorrhage   . GI bleed   . Seizures (HCC)   . TBI (traumatic brain injury) [facility](HCC)     Patient Active Problem List   Diagnosis Date Noted  . Syncope 03/23/2017  . Bleeding duodenal ulcer   . Hypokalemia   . Lactic acidosis   . Duodenal ulcer hemorrhage 10/31/2015  . Urinary retention 10/30/2015  . Acute blood loss anemia 10/29/2015  . Hypotension 10/29/2015  . Syncope and collapse 10/29/2015  . Traumatic brain injury with depressed frontal skull fracture (HCC) 09/07/2012  . Cognitive deficit as late effect of traumatic brain injury (HCC) 09/07/2012  . Episodic dyscontrol syndrome 09/07/2012    Past Surgical History:  Procedure Laterality Date  .  CRANIOTOMY     Post TBI craniotomy and plate insertion  . CSF SHUNT    . ESOPHAGOGASTRODUODENOSCOPY N/A 11/01/2015   Procedure: ESOPHAGOGASTRODUODENOSCOPY (EGD);  Surgeon: Ruffin FrederickSteven Paul Armbruster, MD;  [facility];  Service: Gastroenterology;  Laterality: N/A;  . ESOPHAGOGASTRODUODENOSCOPY (EGD) WITH PROPOFOL N/A 10/30/2015   Procedure: ESOPHAGOGASTRODUODENOSCOPY (EGD) WITH PROPOFOL;  Surgeon: Rachael Feeaniel P Jacobs, MD;  Location: [facility];  Service: Endoscopy;  Laterality: N/A;       Home Medications    Prior to Admission medications   Medication Sig Start Date End Date Taking? Authorizing Provider  atorvastatin (LIPITOR) 20 MG tablet Take 20 mg by mouth daily.   Yes [provider]  carbamazepine (TEGRETOL) 200 MG tablet Take 600 mg by mouth 2 (two) times daily.   Yes [provider]  cholecalciferol (VITAMIN D) 1000 UNITS tablet Take 1,000 Units by mouth daily.   Yes [provider]  Cyanocobalamin (HM SUPER VITAMIN B12 PO) Take 1 tablet by mouth daily.    Yes [provider]  Docusate Calcium (STOOL SOFTENER PO) Take 2 tablets by mouth daily.   Yes [provider]  escitalopram (LEXAPRO) 20 MG tablet Take 20 mg by mouth daily.   Yes [provider]  Multiple Vitamin (MULTIVITAMIN WITH MINERALS) TABS Take 1 tablet by mouth daily.   Yes [provider]  pantoprazole (PROTONIX) 40 MG tablet TAKE 1 TABLET TWICE  DAILY. 12/17/16  Yes Rachael FeeJacobs, Daniel P, MD  tamsulosin [facility](FLOMAX) 0.4 MG CAPS capsule Take 1 capsule (0.4 mg total) by mouth daily after breakfast. 11/07/15  Yes Rhetta MuraSamtani, Jai-Gurmukh, MD  levETIRAcetam (KEPPRA) 500 MG tablet Take 2 tablets (1,000 mg total) by mouth 2 (two) times daily for 14 days. 12/10/17 12/24/17  Shaune PollackIsaacs, Aniayah Alaniz, MD    Family History Family History  Problem Relation Age of Onset  . Heart disease Father        MVR  . Hypertension Mother   . Stroke Maternal Aunt     Social History Social  History   Tobacco Use  . Smoking status: Former Games developermoker  . Smokeless tobacco: Never Used  Substance Use Topics  . Alcohol use: No    Alcohol/week: 0.0 oz  . Drug use: No     Allergies   Nuedexta [dextromethorphan-quinidine] and Nsaids   Review of Systems Review of Systems  Constitutional: Negative for chills, fatigue and fever.  HENT: Negative for congestion and rhinorrhea.   Eyes: Negative for visual disturbance.  Respiratory: Negative for cough, shortness of breath and wheezing.   Cardiovascular: Negative for chest pain and leg swelling.  Gastrointestinal: Negative for abdominal pain, diarrhea, nausea and vomiting.  Genitourinary: Negative for dysuria and flank pain.  Musculoskeletal: Negative for neck pain and neck stiffness.  Skin: Negative for rash and wound.  Allergic/Immunologic: Negative for immunocompromised state.  Neurological: Positive for seizures. Negative for syncope, weakness and headaches.  All other systems reviewed and are negative.    Physical Exam Updated Vital Signs Ht 5\' 8"  (1.727 m)   Wt 80.3 kg (177 lb)   BMI 26.91 kg/m   Physical Exam  Constitutional: He is oriented to person, place, and time. He appears well-developed and well-nourished. No distress.  HENT:  Head: Normocephalic and atraumatic.  No oral trauma  Eyes: Conjunctivae are normal.  Neck: Neck supple.  Cardiovascular: Normal rate, regular rhythm and normal heart sounds. Exam reveals no friction rub.  No murmur heard. Pulmonary/Chest: Effort normal and breath sounds normal. No respiratory distress. He has no wheezes. He has no rales.  Abdominal: Soft. Bowel sounds are normal. He exhibits no distension.  Musculoskeletal: He exhibits no edema.  Neurological: He is alert and oriented to person, place, and time. He has normal strength. No cranial nerve deficit or sensory deficit. He exhibits normal muscle tone. GCS eye subscore is 4. GCS verbal subscore is 5. GCS motor subscore is 6.    Mild baseline dysarthria.  Moves all extremities with 5 out of 5 strength.  Normal sensation light touch.  Face is symmetric.  Skin: Skin is warm. Capillary refill takes less than 2 seconds.  Psychiatric: He has a normal mood and affect.  Nursing note and vitals reviewed.    ED Treatments / Results  Labs (all labs ordered are listed, but only abnormal results are displayed) Labs Reviewed  CARBAMAZEPINE LEVEL, TOTAL - Abnormal; Notable for the following components:      Result Value   Carbamazepine Lvl 12.6 (*)    All other components within normal limits  BASIC METABOLIC PANEL - Abnormal; Notable for the following components:   Sodium 131 (*)    Chloride 97 (*)    Glucose, Bld 118 (*)    All other components within normal limits  URINALYSIS, ROUTINE W REFLEX MICROSCOPIC - Abnormal; Notable for the following components:   Color, Urine COLORLESS (*)    Specific Gravity, Urine 1.000 (*)    All other  components within normal limits  CBG MONITORING, ED - Abnormal; Notable for the following components:   Glucose-Capillary 104 (*)    All other components within normal limits  CBC WITH DIFFERENTIAL/PLATELET  MAGNESIUM    EKG  EKG Interpretation  Date/Time:  Wednesday December 10 2017 14:51:19 EST Ventricular Rate:  79 PR Interval:    QRS Duration: 91 QT Interval:  372 QTC Calculation: 427 R Axis:   -32 Text Interpretation:  Sinus rhythm Prolonged PR interval Probable left atrial enlargement Left axis deviation No significant change since last tracing Confirmed by Shaune Pollack [phone]) on 12/10/2017 3:50:00 PM       Radiology Dg Chest 2 View  Result Date: 12/10/2017 CLINICAL DATA:  51 year old male status post breakthrough seizure. Confusion. EXAM: CHEST  2 VIEW COMPARISON:  11/01/2015 portable chest. FINDINGS: Semi upright AP and lateral views of the chest. Chronic left side CSF shunt catheter tubing tracks into the central anterior abdomen. Partially visible  embolization coils in the abdomen on the lateral view. Lung volumes are within normal limits. Normal cardiac size and mediastinal contours. Visualized tracheal air column is within normal limits. No pneumothorax, pulmonary edema or pleural effusion. However, there is confluent but platelike opacity at the left lung base on the frontal views, not correlated on the lateral. No other confluent pulmonary opacity. No acute osseous abnormality identified. Negative visible bowel gas pattern. IMPRESSION: Platelike atelectasis suspected at the left lung base. No other acute cardiopulmonary abnormality. Electronically Signed   By: Odessa Fleming M.D.   On: 12/10/2017 15:39    Procedures Procedures (including critical care time)  Medications Ordered in ED Medications  LORazepam (ATIVAN) injection 1 mg (1 mg Intravenous Given 12/10/17 1545)  levETIRAcetam (KEPPRA) 1,000 mg in sodium chloride 0.9 % 100 mL IVPB (0 mg Intravenous Stopped 12/10/17 1607)  sodium chloride 0.9 % bolus 1,000 mL (1,000 mLs Intravenous New Bag/Given 12/10/17 1543)     Initial Impression / Assessment and Plan / ED Course  I have reviewed the triage vital signs and the nursing notes.  Pertinent labs & imaging results that were available during my care of the patient were reviewed by me and considered in my medical decision making (see chart for details).     51 year old male with past medical history of TBI and seizure disorder here with back to back generalized tonic-clonic activity.  He is back to his baseline.  No apparent head or oral trauma.  Suspect this is breakthrough seizure in the setting of stopping his Keppra.  However, given that he has no other apparent triggers, will obtain screening lab work and imaging to assess for possible underlying metabolic or infectious etiologies.  He has a history of duodenal ulcer but denies any abdominal pain at this time.  He has not had any melena.  Will give him Ativan and loaded with Keppra, and  reassess.  Patient remains at his baseline.  Screening lab work is largely unremarkable.  Urinalysis is pending.  His chest x-ray does show platelike atelectasis but he denies any cough, sputum production, shortness of breath, or other symptoms to suggest pneumonia and he has a normal white blood cell count.  Will consult neurology for evaluation and recommendation regarding antiepileptics.  Suspect he can be discharged home pending this discussion.  Discussed with tele-neurology on call.  Patient will be started on an increased dose of Keppra 1 g twice a day.  He will follow-up with his neurologist.  He remains at his baseline and hemodynamic  was stable.  No indication for admission per tele-neurology.  Final Clinical Impressions(s) / ED Diagnoses   Final diagnoses:  Seizure [facility])    ED Discharge Orders        Ordered    levETIRAcetam (KEPPRA) 500 MG tablet  2 times daily     12/10/17 1815       Shaune Pollack, MD 12/10/17 1831    Shaune Pollack, MD 12/10/17 [phone]

## 2017-12-25 ENCOUNTER — Encounter: Payer: Self-pay | Admitting: Neurology

## 2017-12-25 ENCOUNTER — Ambulatory Visit (INDEPENDENT_AMBULATORY_CARE_PROVIDER_SITE_OTHER): Payer: PPO | Admitting: Neurology

## 2017-12-25 VITALS — BP 136/78 | HR 95 | Ht 70.0 in | Wt 168.4 lb

## 2017-12-25 DIAGNOSIS — G40109 Localization-related (focal) (partial) symptomatic epilepsy and epileptic syndromes with simple partial seizures, not intractable, without status epilepticus: Secondary | ICD-10-CM

## 2017-12-25 DIAGNOSIS — S069X5D Unspecified intracranial injury with loss of consciousness greater than 24 hours with return to pre-existing conscious level, subsequent encounter: Secondary | ICD-10-CM | POA: Diagnosis not present

## 2017-12-25 MED ORDER — LAMOTRIGINE 25 MG PO TABS: ORAL_TABLET | ORAL | 0 refills | Status: DC

## 2017-12-25 MED ORDER — LEVETIRACETAM 500 MG PO TABS: 1000.0000 mg | ORAL_TABLET | Freq: Two times a day (BID) | ORAL | 1 refills | Status: DC

## 2017-12-25 NOTE — Progress Notes (Signed)
NEUROLOGY FOLLOW UP OFFICE NOTE  Zachary Thornton [id number]  HISTORY OF PRESENT ILLNESS: Zachary Thornton is a 52 year old man with TBI and history of GI bleed who follows up for seizure.  History supplemented by a family friend who accompanies him.  I also spoke to his mother via phone.   UPDATE: He had a seizure on 12/10/17, described as staring spell of unresponsiveness followed by generalized tonic-clonic activity with urinary incontinence.  ED note mentions he was not taking Keppra, but his caregiver disputes this.  Carbamazepine level was 12.6.  Na was 131, K 4 and glucose 118.  UA was negative for infection.  He was loaded with Keppra and discharged on Keppra 1000mg  twice daily.  Since then, he has had increased irritability and verbally abusive    HISTORY: He had a traumatic brain injury in 2012 after falling and hitting the back of his head on concrete.  He fractured his skull and sustained intracranial bleeding, requiring right craniectomy and VP shunt. [name]  He was in a coma for 6 weeks.  After 2 month hospitalization, he was discharged to inpatient rehab facility where he had to relearn how to walk, talk and feed himself.  His wife was unable to care for him and he subsequently moved in with his mother, who is his guardian.  His wife and son live in [place].   He was hospitalized in December for GI bleed due to duodenal ulcer, requiring multiple blood transfusions.   He presented to the ED on 04/29/16 following an unwitnessed fall in which he hit his head.  He did not recall the event.  His mother heard him fall in the bathroom and found him on the floor with eyes rolled back, unresponsive with flexed posturing and shaking of both upper extremities, lasting about 2 minutes.  He had bowel and bladder incontinence.  He was slightly confused afterwards for about 10 minutes.  He did not exhibit foaming at the mouth or tongue biting.  CT of head was personally reviewed and showed chronic changes of  traumatic brain injury with bifrontal and temporal encephalomalacia, as well as post surgical changes of right craniectomy with cranioplasty and left frontal approach VP shunt.  No evidence of hydrocephalus seen.  He was started on Keppra.  He has not had any recurrent spells.  He was found to have worsening anemia and received further transfusions by his PCP.  He does not drive.    He was admitted to [facility] from 03/23/17 to 03/24/17 for syncope.  He had a brief syncopal episode at church.  His face became pale.  He did not exhibit seizure-like activity.  It was brief.  He was found to be hypotensive by EMS with a BP of 60/30 and was given IV fluids, which improved blood pressure.  He was not orthostatic in the hospital.  Work up in the hospital included head CT with no acute findings, EEG with right hemispheric slowing (which could be due to underlying physiologic abnormality from encephalomalacia and surgery), but no epileptiform discharges, and unremarkable EKG and troponins.  He has no history of seizures prior to 2017.  PAST MEDICAL HISTORY: Past Medical History:  Diagnosis Date  . Back injury   . Blood loss anemia   . Dementia with behavioral disturbance   . Duodenal ulcer hemorrhage   . GI bleed   . Seizures (HCC)   . TBI (traumatic brain injury) [facility])     MEDICATIONS: Current  Outpatient Medications on File Prior to Visit  Medication Sig Dispense Refill  . atorvastatin (LIPITOR) 20 MG tablet Take 20 mg by mouth daily.    . carbamazepine (TEGRETOL) 200 MG tablet Take 600 mg by mouth 2 (two) times daily.    . cholecalciferol (VITAMIN D) 1000 UNITS tablet Take 1,000 Units by mouth daily.    . Cyanocobalamin (HM SUPER VITAMIN B12 PO) Take 1 tablet by mouth daily.     [name]. Docusate Calcium (STOOL SOFTENER PO) Take 2 tablets by mouth daily.    [name]. escitalopram (LEXAPRO) 20 MG tablet Take 20 mg by mouth daily.    [name]. levETIRAcetam (KEPPRA) 500 MG tablet Take 2 tablets (1,000 mg  total) by mouth 2 (two) times daily for 14 days. 56 tablet 0  . Multiple Vitamin (MULTIVITAMIN WITH MINERALS) TABS Take 1 tablet by mouth daily.    . pantoprazole (PROTONIX) 40 MG tablet TAKE 1 TABLET TWICE DAILY. 60 tablet 0  . tamsulosin (FLOMAX) 0.4 MG CAPS capsule Take 1 capsule (0.4 mg total) by mouth daily after breakfast. 30 capsule 0   No current facility-administered medications on file prior to visit.     ALLERGIES: Allergies  Allergen Reactions  . Nuedexta [Dextromethorphan-Quinidine] Anaphylaxis    Lips began to swell  . Nsaids Other (See Comments)    Due to GI bleeding    FAMILY HISTORY: Family History  Problem Relation Age of Onset  . Heart disease Father        MVR  . Hypertension Mother   . Stroke Maternal Aunt     SOCIAL HISTORY: Social History   Socioeconomic History  . Marital status: Married    Spouse name: Not on file  . Number of children: 1  . Years of education: Not on file  . Highest education level: Not on file  Social Needs  . Financial resource strain: Not on file  . Food insecurity - worry: Not on file  . Food insecurity - inability: Not on file  . Transportation needs - medical: Not on file  . Transportation needs - non-medical: Not on file  Occupational History  . Occupation: Disabled  Tobacco Use  . Smoking status: Former Games developermoker  . Smokeless tobacco: Never Used  Substance and Sexual Activity  . Alcohol use: No    Alcohol/week: 0.0 oz  . Drug use: No  . Sexual activity: Yes  Other Topics Concern  . Not on file  Social History Narrative   Lives with his mother.  Normally ambulates without assistance.      REVIEW OF SYSTEMS: Constitutional: No fevers, chills, or sweats, no generalized fatigue, change in appetite Eyes: No visual changes, double vision, eye pain Ear, nose and throat: No hearing loss, ear pain, nasal congestion, sore throat Cardiovascular: No chest pain, palpitations Respiratory:  No shortness of breath at rest  or with exertion, wheezes GastrointestinaI: No nausea, vomiting, diarrhea, abdominal pain, fecal incontinence Genitourinary:  No dysuria, urinary retention or frequency Musculoskeletal:  No neck pain, back pain Integumentary: No rash, pruritus, skin lesions Neurological: as above Psychiatric: No depression, insomnia, anxiety Endocrine: No palpitations, fatigue, diaphoresis, mood swings, change in appetite, change in weight, increased thirst Hematologic/Lymphatic:  No purpura, petechiae. Allergic/Immunologic: no itchy/runny eyes, nasal congestion, recent allergic reactions, rashes  PHYSICAL EXAM: Vitals:   12/25/17 1410  BP: 136/78  Pulse: 95  SpO2: 94%   General: No acute distress.  Patient appears well-groomed.  normal body habitus. Head:  Depression on right temple. Eyes:  Fundi examined but not visualized Neck: supple, no paraspinal tenderness, full range of motion Heart:  Regular rate and rhythm Lungs:  Clear to auscultation bilaterally Back: No paraspinal tenderness Neurological Exam: alert and oriented to person, place, and time. Attention span and concentration intact, recent and remote memory intact, fund of knowledge intact.  Speech fluent and not dysarthric, language intact.  CN II-XII intact. Bulk and tone normal, muscle strength 5/5 throughout.  Sensation to light touch  intact.  Deep tendon reflexes 2+ throughout.  Finger to nose testing intact.  Gait normal, Romberg negative.  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  Will transition from Keppra to Lamictal.  PLAN: 1.  Start lamotrigine titration to goal of 50mg  twice daily.  Instructed to monitor and report any new or usual rash. 2.  Follow up in 5 weeks.  If doing well, we will increase lamotrigine to 100mg  twice daily and start tapering off of Keppra. 3.  Continue Tegretol 600mg  twice daily.  25 minutes spent face to face with  patient, over 50% spent discussing management.  Shon Millet, DO  CC: [name], MD

## 2017-12-25 NOTE — Patient Instructions (Signed)
1.  Continue carbamazepine 600mg  twice daily and Keppra 1000mg  twice daily for now. 2.  We will start lamotrigine:   AM   PM Week 1&2    25mg  (1 tab) Week 3&4 25mg  (1 tab)  25mg  (1 tab) Week 5 50mg  (2 tabs)  50mg  (2 tabs)  At beginning of week 6, I want you to follow up with me.  At that time, we will taper off of Keppra. Contact us if you develop any new or unusual rash.  3.  Follow up in 5 weeks.

## 2017-12-30 DIAGNOSIS — Z23 Encounter for immunization: Secondary | ICD-10-CM | POA: Diagnosis not present

## 2018-01-02 ENCOUNTER — Ambulatory Visit: Payer: Medicare Other | Admitting: Neurology

## 2018-01-23 DIAGNOSIS — R569 Unspecified convulsions: Secondary | ICD-10-CM | POA: Diagnosis not present

## 2018-01-23 DIAGNOSIS — Z125 Encounter for screening for malignant neoplasm of prostate: Secondary | ICD-10-CM | POA: Diagnosis not present

## 2018-01-23 DIAGNOSIS — R82998 Other abnormal findings in urine: Secondary | ICD-10-CM | POA: Diagnosis not present

## 2018-01-23 DIAGNOSIS — E7849 Other hyperlipidemia: Secondary | ICD-10-CM | POA: Diagnosis not present

## 2018-01-23 DIAGNOSIS — E559 Vitamin D deficiency, unspecified: Secondary | ICD-10-CM | POA: Diagnosis not present

## 2018-01-30 ENCOUNTER — Encounter: Payer: Self-pay | Admitting: Neurology

## 2018-01-30 ENCOUNTER — Ambulatory Visit (INDEPENDENT_AMBULATORY_CARE_PROVIDER_SITE_OTHER): Payer: Self-pay | Admitting: Neurology

## 2018-01-30 VITALS — BP 138/82 | HR 96 | Ht 70.0 in | Wt 164.8 lb

## 2018-01-30 DIAGNOSIS — R945 Abnormal results of liver function studies: Secondary | ICD-10-CM | POA: Diagnosis not present

## 2018-01-30 DIAGNOSIS — E7849 Other hyperlipidemia: Secondary | ICD-10-CM | POA: Diagnosis not present

## 2018-01-30 DIAGNOSIS — Z982 Presence of cerebrospinal fluid drainage device: Secondary | ICD-10-CM | POA: Diagnosis not present

## 2018-01-30 DIAGNOSIS — Z23 Encounter for immunization: Secondary | ICD-10-CM | POA: Diagnosis not present

## 2018-01-30 DIAGNOSIS — S069X5D Unspecified intracranial injury with loss of consciousness greater than 24 hours with return to pre-existing conscious level, subsequent encounter: Secondary | ICD-10-CM

## 2018-01-30 DIAGNOSIS — Z6823 Body mass index (BMI) 23.0-23.9, adult: Secondary | ICD-10-CM | POA: Diagnosis not present

## 2018-01-30 DIAGNOSIS — J309 Allergic rhinitis, unspecified: Secondary | ICD-10-CM | POA: Diagnosis not present

## 2018-01-30 DIAGNOSIS — S069X0S Unspecified intracranial injury without loss of consciousness, sequela: Secondary | ICD-10-CM | POA: Diagnosis not present

## 2018-01-30 DIAGNOSIS — G40109 Localization-related (focal) (partial) symptomatic epilepsy and epileptic syndromes with simple partial seizures, not intractable, without status epilepticus: Secondary | ICD-10-CM

## 2018-01-30 DIAGNOSIS — R569 Unspecified convulsions: Secondary | ICD-10-CM | POA: Diagnosis not present

## 2018-01-30 DIAGNOSIS — K279 Peptic ulcer, site unspecified, unspecified as acute or chronic, without hemorrhage or perforation: Secondary | ICD-10-CM | POA: Diagnosis not present

## 2018-01-30 DIAGNOSIS — R2689 Other abnormalities of gait and mobility: Secondary | ICD-10-CM | POA: Diagnosis not present

## 2018-01-30 DIAGNOSIS — Z0000 Encounter for general adult medical examination without abnormal findings: Secondary | ICD-10-CM | POA: Diagnosis not present

## 2018-01-30 DIAGNOSIS — Z1389 Encounter for screening for other disorder: Secondary | ICD-10-CM | POA: Diagnosis not present

## 2018-01-30 DIAGNOSIS — D6489 Other specified anemias: Secondary | ICD-10-CM | POA: Diagnosis not present

## 2018-01-30 MED ORDER — LAMOTRIGINE 100 MG PO TABS: 100.0000 mg | ORAL_TABLET | Freq: Two times a day (BID) | ORAL | 3 refills | Status: DC

## 2018-01-30 NOTE — Patient Instructions (Signed)
1.  We will increase lamotrigine to 100mg  twice daily (pick up prescription for 100mg  pills) 2.  We will slowly get off of the levetiracetam (Keppra).  Take 1 pill twice daily for one week, then 1 pill daily for one week, then STOP 3.  Continue carbamazepine 600mg  twice daily 4.  Follow up in 3 months.

## 2018-01-30 NOTE — Progress Notes (Signed)
NEUROLOGY FOLLOW UP OFFICE NOTE  Zachary Thornton [id number]  HISTORY OF PRESENT ILLNESS: Zachary Thornton is a 52 year old man with TBI and history of GI bleed who follows up for seizure.  History supplemented by a family friend who accompanies him.    UPDATE: Current medications:  Lamotrigine 50mg  twice daily, levetiracetam 1000mg  twice daily, carbamazepine 600mg  twice daily Due to increased irritability on leveiteracetam, he was started on lamotrigine last month, titrated to 50mg  twice daily, with goal to eventually taper off levetiracetam.  He is doing well.  No seizures.  At first, he was a little tired on the lamotrigine, but is feeling better.  Mood is better.  01/23/18 LABS:  CBC with WBC 5.27, HGB 15.3, HCT 44.1, PLT 241; CMP with Na 133, K 4.6, Cl 98, CO2 27, glucose 99, BUN 13, Cr 0.9, t bili 0.4, ALP 98, AST 14, and ALT 13.  HISTORY: He had a traumatic brain injury in 2012 after falling and hitting the back of his head on concrete.  He fractured his skull and sustained intracranial bleeding, requiring right craniectomy and VP shunt. [name]  He was in a coma for 6 weeks.  After 2 month hospitalization, he was discharged to inpatient rehab facility where he had to relearn how to walk, talk and feed himself.  His wife was unable to care for him and he subsequently moved in with his mother, who is his guardian.  His wife and son live in [place].   He was hospitalized in December for GI bleed due to duodenal ulcer, requiring multiple blood transfusions.   He presented to the ED on 04/29/16 following an unwitnessed fall in which he hit his head.  He did not recall the event.  His mother heard him fall in the bathroom and found him on the floor with eyes rolled back, unresponsive with flexed posturing and shaking of both upper extremities, lasting about 2 minutes.  He had bowel and bladder incontinence.  He was slightly confused afterwards for about 10 minutes.  He did not exhibit foaming at the mouth  or tongue biting.  CT of head was personally reviewed and showed chronic changes of traumatic brain injury with bifrontal and temporal encephalomalacia, as well as post surgical changes of right craniectomy with cranioplasty and left frontal approach VP shunt.  No evidence of hydrocephalus seen.  He was started on Keppra.  He has not had any recurrent spells.  He was found to have worsening anemia and received further transfusions by his PCP.  He does not drive.     He was admitted to [facility] from 03/23/17 to 03/24/17 for syncope.  He had a brief syncopal episode at church.  His face became pale.  He did not exhibit seizure-like activity.  It was brief.  He was found to be hypotensive by EMS with a BP of 60/30 and was given IV fluids, which improved blood pressure.  He was not orthostatic in the hospital.  Work up in the hospital included head CT with no acute findings, EEG with right hemispheric slowing (which could be due to underlying physiologic abnormality from encephalomalacia and surgery), but no epileptiform discharges, and unremarkable EKG and troponins.  He had a seizure on 12/10/17, described as staring spell of unresponsiveness followed by generalized tonic-clonic activity with urinary incontinence.  ED note mentions he was not taking Keppra, but his caregiver disputes this.  Carbamazepine level was 12.6.  Na was 131, K 4 and  glucose 118.  UA was negative for infection.  He was loaded with Keppra and discharged on Keppra 1000mg  twice daily.  Since then, he has had increased irritability and verbally abusive   He has no history of seizures prior to 2017.  PAST MEDICAL HISTORY: Past Medical History:  Diagnosis Date  . Back injury   . Blood loss anemia   . Dementia with behavioral disturbance   . Duodenal ulcer hemorrhage   . GI bleed   . Seizures (HCC)   . TBI (traumatic brain injury) [facility])     MEDICATIONS: Current Outpatient Medications on File Prior to Visit    Medication Sig Dispense Refill  . atorvastatin (LIPITOR) 20 MG tablet Take 20 mg by mouth daily.    . carbamazepine (TEGRETOL) 200 MG tablet Take 600 mg by mouth 2 (two) times daily.    . cholecalciferol (VITAMIN D) 1000 UNITS tablet Take 1,000 Units by mouth daily.    . Cyanocobalamin (HM SUPER VITAMIN B12 PO) Take 1 tablet by mouth daily.     [name] Calcium (STOOL SOFTENER PO) Take 2 tablets by mouth daily.    [name] escitalopram (LEXAPRO) 20 MG tablet Take 20 mg by mouth daily.    . Multiple Vitamin (MULTIVITAMIN WITH MINERALS) TABS Take 1 tablet by mouth daily.    . pantoprazole (PROTONIX) 40 MG tablet TAKE 1 TABLET TWICE DAILY. 60 tablet 0  . tamsulosin (FLOMAX) 0.4 MG CAPS capsule Take 1 capsule (0.4 mg total) by mouth daily after breakfast. 30 capsule 0   No current facility-administered medications on file prior to visit.     ALLERGIES: Allergies  Allergen Reactions  . Nuedexta [Dextromethorphan-Quinidine] Anaphylaxis    Lips began to swell  . Nsaids Other (See Comments)    Due to GI bleeding    FAMILY HISTORY: Family History  Problem Relation Age of Onset  . Heart disease Father        MVR  . Hypertension Mother   . Stroke Maternal Aunt     SOCIAL HISTORY: Social History   Socioeconomic History  . Marital status: Married    Spouse name: Not on file  . Number of children: 1  . Years of education: Not on file  . Highest education level: Not on file  Social Needs  . Financial resource strain: Not on file  . Food insecurity - worry: Not on file  . Food insecurity - inability: Not on file  . Transportation needs - medical: Not on file  . Transportation needs - non-medical: Not on file  Occupational History  . Occupation: Disabled  Tobacco Use  . Smoking status: Former Games developer  . Smokeless tobacco: Never Used  Substance and Sexual Activity  . Alcohol use: No    Alcohol/week: 0.0 oz  . Drug use: No  . Sexual activity: Yes  Other Topics Concern  . Not on  file  Social History Narrative   Lives with his mother.  Normally ambulates without assistance.      REVIEW OF SYSTEMS: Constitutional: No fevers, chills, or sweats, no generalized fatigue, change in appetite Eyes: No visual changes, double vision, eye pain Ear, nose and throat: No hearing loss, ear pain, nasal congestion, sore throat Cardiovascular: No chest pain, palpitations Respiratory:  No shortness of breath at rest or with exertion, wheezes GastrointestinaI: No nausea, vomiting, diarrhea, abdominal pain, fecal incontinence Genitourinary:  No dysuria, urinary retention or frequency Musculoskeletal:  No neck pain, back pain Integumentary: No rash, pruritus, skin lesions Neurological:  as above Psychiatric: No depression, insomnia, anxiety Endocrine: No palpitations, fatigue, diaphoresis, mood swings, change in appetite, change in weight, increased thirst Hematologic/Lymphatic:  No purpura, petechiae. Allergic/Immunologic: no itchy/runny eyes, nasal congestion, recent allergic reactions, rashes  PHYSICAL EXAM: Vitals:   01/30/18 1242  BP: 138/82  Pulse: 96  SpO2: 98%   General: No acute distress.  Patient appears well-groomed.   Head:  Normocephalic/atraumatic Eyes:  Fundi examined but not visualized Neck: supple, no paraspinal tenderness, full range of motion Heart:  Regular rate and rhythm Lungs:  Clear to auscultation bilaterally Back: No paraspinal tenderness Neurological Exam: alert and oriented to person, place, and time. Attention span and concentration intact, recent and remote memory intact, fund of knowledge intact.  Speech fluent and not dysarthric, language intact.  CN II-XII intact. Bulk and tone normal, muscle strength 5/5 throughout.  Sensation to light touch  intact.  Deep tendon reflexes 2+ throughout.  Finger to nose testing intact.  Gait normal, Romberg negative.  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  Increase  lamotrigine to 100mg  twice daily 2.  Taper off of levetiracetam:  500mg  twice daily for 1 week, then 500mg  daily for 1 week, then STOP. 3.  Continue carbamazepine 600mg  twice daily 4.  Follow up in 3 months.  19 minutes spent face to face with patient, over 50% spent discussing management.  Shon MilletAdam Ivery Michalski, DO  CC: Rodrigo RanMark Perini, MD

## 2018-04-09 ENCOUNTER — Other Ambulatory Visit: Payer: Self-pay | Admitting: Neurology

## 2018-04-13 ENCOUNTER — Telehealth: Payer: Self-pay

## 2018-04-13 NOTE — Telephone Encounter (Signed)
Rcvd an after hours call report, Pt had a seizure, was extremely anxious afterwards. Was advised to call EMS or proceed to ED. Called and spoke with [name], Zachary Thornton. She stated Pt calmed shortly afterwards and they felt there was no need to be evaluated as seizure did not last long and Pt rtrnd to normal quickly. I advsd her to call our office with any further concerns.

## 2018-04-28 ENCOUNTER — Telehealth: Payer: Self-pay | Admitting: Neurology

## 2018-04-28 NOTE — Telephone Encounter (Signed)
He should be seen sooner

## 2018-04-28 NOTE — Telephone Encounter (Signed)
Zachary Thornton who is the patient's Full time Caregiver called and wanted to let Dr. Everlena Cooper know that he had a Seizure last week and since the Seizure his Gait has been "a little off". Please Call. Thanks

## 2018-04-28 NOTE — Telephone Encounter (Signed)
Called and spoke with [facility]. She said she and [name] are concerned that his gait is off. He is stumbling though they have caught him before he falls completely. The seizure the Pt had  recently was not lengthy and he recovered quickly, did not want to go to the ED. After that is when they noticed a decline in his walking. They are questioning if he should he be seen here prior to his 5/23 appointment? Or call PCP

## 2018-04-29 NOTE — Telephone Encounter (Signed)
Called and spoke with [facility] and told her that I did not have anything any sooner than 05-14-18 but had him on the wait list and he would be the first one that we call to move his appt up.

## 2018-04-29 NOTE — Telephone Encounter (Signed)
Dana Please contact [name] and schedule appointment sooner than 5/23-

## 2018-04-30 ENCOUNTER — Other Ambulatory Visit: Payer: Self-pay | Admitting: Neurology

## 2018-05-06 ENCOUNTER — Encounter: Payer: Self-pay | Admitting: Neurology

## 2018-05-06 ENCOUNTER — Ambulatory Visit (INDEPENDENT_AMBULATORY_CARE_PROVIDER_SITE_OTHER): Payer: PPO | Admitting: Neurology

## 2018-05-06 ENCOUNTER — Other Ambulatory Visit: Payer: PPO

## 2018-05-06 VITALS — BP 118/76 | HR 100 | Ht 70.0 in | Wt 161.0 lb

## 2018-05-06 DIAGNOSIS — R270 Ataxia, unspecified: Secondary | ICD-10-CM

## 2018-05-06 DIAGNOSIS — Z79899 Other long term (current) drug therapy: Secondary | ICD-10-CM

## 2018-05-06 DIAGNOSIS — S069X5D Unspecified intracranial injury with loss of consciousness greater than 24 hours with return to pre-existing conscious level, subsequent encounter: Secondary | ICD-10-CM | POA: Diagnosis not present

## 2018-05-06 DIAGNOSIS — G40109 Localization-related (focal) (partial) symptomatic epilepsy and epileptic syndromes with simple partial seizures, not intractable, without status epilepticus: Secondary | ICD-10-CM

## 2018-05-06 NOTE — Patient Instructions (Addendum)
1.  Continue current medications for now 2.  We will check carbamazepine level, lamotrigine level and CMP to look and see if the seizure medications are too elevated. Your provider has requested that you have labwork completed today. Please go to [facility] ([id number]) on the second floor of this building before leaving the office today. You do not need to check in. If you are not called within 15 minutes please check with the front desk.  3.  If labs are okay, then next step would be MRI of brain 4.  Follow soon

## 2018-05-06 NOTE — Progress Notes (Signed)
CMP

## 2018-05-06 NOTE — Progress Notes (Signed)
NEUROLOGY FOLLOW UP OFFICE NOTE  [name] [id number]  HISTORY OF PRESENT ILLNESS: Zachary Thornton is a 52 year old man with TBI and history of GI bleed who follows up for seizure.  History supplemented by a family friend who accompanies him.    UPDATE: Current medications:  Lamotrigine  twice daily, carbamazepine  twice daily Last visit in February, lamotrigine was increased from  twice daily to  twice daily and Keppra was tapered off due to irritable mood.  His mood is improved.   He had a habitual seizure in late April.  It was short and he recovered quickly.  However, since then he has had decline in gait.  He is much more off-balance.  He has not had any falls but someone is around to steady him if he does lose balance.  He denies headache, double vision, dizziness.  He says he feels fine.  Symptoms are unchanged.   HISTORY: He had a traumatic brain injury in 2012 after falling and hitting the back of his head on concrete.  He fractured his skull and sustained intracranial bleeding, requiring right craniectomy and VP shunt. Zachary Thornton  He was in a coma for 6 weeks.  After 2 month hospitalization, he was discharged to inpatient rehab facility where he had to relearn how to walk, talk and feed himself.  His wife was unable to care for him and he subsequently moved in with his mother, who is his guardian.  His wife and son live in [place].   He was hospitalized in December for GI bleed due to duodenal ulcer, requiring multiple blood transfusions.   He presented to the ED on 04/29/16 following an unwitnessed fall in which he hit his head.  He did not recall the event.  His mother heard him fall in the bathroom and found him on the floor with eyes rolled back, unresponsive with flexed posturing and shaking of both upper extremities, lasting about 2 minutes.  He had bowel and bladder incontinence.  He was slightly confused afterwards for about 10 minutes.  He did not exhibit foaming  at the mouth or tongue biting.  CT of head was personally reviewed and showed chronic changes of traumatic brain injury with bifrontal and temporal encephalomalacia, as well as post surgical changes of right craniectomy with cranioplasty and left frontal approach VP shunt.  No evidence of hydrocephalus seen.  He was started on Keppra.  He has not had any recurrent spells.  He was found to have worsening anemia and received further transfusions by his PCP.  He does not drive.     He was admitted to [facility] from 03/23/17 to 03/24/17 for syncope.  He had a brief syncopal episode at church.  His face became pale.  He did not exhibit seizure-like activity.  It was brief.  He was found to be hypotensive by EMS with a BP of 60/30 and was given IV fluids, which improved blood pressure.  He was not orthostatic in the hospital.  Work up in the hospital included head CT with no acute findings, EEG with right hemispheric slowing (which could be due to underlying physiologic abnormality from encephalomalacia and surgery), but no epileptiform discharges, and unremarkable EKG and troponins.  My suspicion is that this was not a seizure.   He had a seizure on 12/10/17, described as staring spell of unresponsiveness followed by generalized tonic-clonic activity with urinary incontinence.  ED note mentions he was not taking Keppra, but his  caregiver disputes this.  Carbamazepine level was 12.6.  Na was 131, K 4 and glucose 118.  UA was negative for infection.  He was loaded with Keppra and discharged on Keppra  twice daily.  Since then, he has had increased irritability and verbally abusive   He has no history of seizures prior to 2017.  PAST MEDICAL HISTORY: Past Medical History:  Diagnosis Date  . Back injury   . Blood loss anemia   . Dementia with behavioral disturbance   . Duodenal ulcer hemorrhage   . GI bleed   . Seizures (HCC)   . TBI (traumatic brain injury) [facility])      MEDICATIONS: Current Outpatient Medications on File Prior to Visit  Medication Sig Dispense Refill  . atorvastatin (LIPITOR) 20 MG tablet Take 20 mg by mouth daily.    . carbamazepine (TEGRETOL) 200 MG tablet Take 600 mg by mouth 2 (two) times daily.    . cholecalciferol (VITAMIN D) 1000 UNITS tablet Take 1,000 Units by mouth daily.    . Cyanocobalamin (HM SUPER VITAMIN B12 PO) Take 1 tablet by mouth daily.     [name] Calcium (STOOL SOFTENER PO) Take 2 tablets by mouth daily.    Zachary Thornton escitalopram (LEXAPRO) 20 MG tablet Take 20 mg by mouth daily.    Zachary Thornton lamoTRIgine (LAMICTAL) 100 MG tablet Take 1 tablet (100 mg total) by mouth 2 (two) times daily. 60 tablet 3  . Multiple Vitamin (MULTIVITAMIN WITH MINERALS) TABS Take 1 tablet by mouth daily.    . pantoprazole (PROTONIX) 40 MG tablet TAKE 1 TABLET TWICE DAILY. 60 tablet 0  . tamsulosin (FLOMAX) 0.4 MG CAPS capsule Take 1 capsule (0.4 mg total) by mouth daily after breakfast. 30 capsule 0   No current facility-administered medications on file prior to visit.     ALLERGIES: Allergies  Allergen Reactions  . Nuedexta [Dextromethorphan-Quinidine] Anaphylaxis    Lips began to swell  . Nsaids Other (See Comments)    Due to GI bleeding    FAMILY HISTORY: Family History  Problem Relation Age of Onset  . Heart disease Father        MVR  . Hypertension Mother   . Stroke Maternal Aunt     SOCIAL HISTORY: Social History   Socioeconomic History  . Marital status: Married    Spouse name: Not on file  . Number of children: 1  . Years of education: Not on file  . Highest education level: Not on file  Occupational History  . Occupation: Disabled  Social Needs  . Financial resource strain: Not on file  . Food insecurity:    Worry: Not on file    Inability: Not on file  . Transportation needs:    Medical: Not on file    Non-medical: Not on file  Tobacco Use  . Smoking status: Former Games developer  . Smokeless tobacco: Never Used   Substance and Sexual Activity  . Alcohol use: No    Alcohol/week: 0.0 oz  . Drug use: No  . Sexual activity: Yes  Lifestyle  . Physical activity:    Days per week: Not on file    Minutes per session: Not on file  . Stress: Not on file  Relationships  . Social connections:    Talks on phone: Not on file    Gets together: Not on file    Attends religious service: Not on file    Active member of club or organization: Not on file  Attends meetings of clubs or organizations: Not on file    Relationship status: Not on file  . Intimate partner violence:    Fear of current or ex partner: Not on file    Emotionally abused: Not on file    Physically abused: Not on file    Forced sexual activity: Not on file  Other Topics Concern  . Not on file  Social History Narrative   Lives with his mother.  Normally ambulates without assistance.      REVIEW OF SYSTEMS: Constitutional: No fevers, chills, or sweats, no generalized fatigue, change in appetite Eyes: No visual changes, double vision, eye pain Ear, nose and throat: No hearing loss, ear pain, nasal congestion, sore throat Cardiovascular: No chest pain, palpitations Respiratory:  No shortness of breath at rest or with exertion, wheezes GastrointestinaI: No nausea, vomiting, diarrhea, abdominal pain, fecal incontinence Genitourinary:  No dysuria, urinary retention or frequency Musculoskeletal:  No neck pain, back pain Integumentary: No rash, pruritus, skin lesions Neurological: as above Psychiatric: No depression, insomnia, anxiety Endocrine: No palpitations, fatigue, diaphoresis, mood swings, change in appetite, change in weight, increased thirst Hematologic/Lymphatic:  No purpura, petechiae. Allergic/Immunologic: no itchy/runny eyes, nasal congestion, recent allergic reactions, rashes  PHYSICAL EXAM: Vitals:   05/06/18 0906  BP: 118/76  Pulse: 100  SpO2: 98%   General: No acute distress.  Patient appears well-groomed.   normal body habitus. Head:  Normocephalic/atraumatic Eyes:  Fundi examined but not visualized Neck: supple, no paraspinal tenderness, full range of motion Heart:  Regular rate and rhythm Lungs:  Clear to auscultation bilaterally Back: No paraspinal tenderness Neurological Exam: alert and oriented to person and place, knows year but not date (said it was November 7). Attention span and concentration intact, recent and remote memory intact, fund of knowledge intact.  Speech fluent and not dysarthric, language intact.  End-gaze horizontal nystagmus, saccadic eye movements with tracking.  Otherwise, CN II-XII intact. Bulk and tone normal, muscle strength 5/5 throughout.  Sensation to light touch  intact.  Deep tendon reflexes 2+ throughout.  Finger to nose testing with mild dysmetria bilaterally.  Left hemiplegic gait with some ataxia.  Romberg positive  IMPRESSION: 1.  Nystagmus and ataxia.  No lateralizing symptoms appreciated.  Consider pharmacologic toxicity. 2.  Symptomatic localization-related epilepsy as late effect of traumatic brain injury.  PLAN: 1.  Continue carbamazepine  twice daily and lamotrigine  twice daily for now. 2.  Check carbamazepine level, lamotrigine level and CMP.  If levels and electrolytes unremarkable, then would proceed with MRI of brain. 3.  Follow up soon.  17 minutes spent face to face with patient, over 50% spent discussing plan.  Shon Millet, DO  CC:  Dr. Waynard Edwards

## 2018-05-10 LAB — COMPREHENSIVE METABOLIC PANEL
AG RATIO: 2 (calc) (ref 1.0–2.5)
ALKALINE PHOSPHATASE (APISO): 114 U/L (ref 40–115)
ALT: 17 U/L (ref 9–46)
AST: 15 U/L (ref 10–35)
Albumin: 4.7 g/dL (ref 3.6–5.1)
BUN: 8 mg/dL (ref 7–25)
CALCIUM: 9.9 mg/dL (ref 8.6–10.3)
CHLORIDE: 89 mmol/L — AB (ref 98–110)
CO2: 29 mmol/L (ref 20–32)
Creat: 0.91 mg/dL (ref 0.70–1.33)
GLOBULIN: 2.3 g/dL (ref 1.9–3.7)
GLUCOSE: 92 mg/dL (ref 65–99)
Potassium: 5.4 mmol/L — ABNORMAL HIGH (ref 3.5–5.3)
Sodium: 127 mmol/L — ABNORMAL LOW (ref 135–146)
Total Bilirubin: 0.4 mg/dL (ref 0.2–1.2)
Total Protein: 7 g/dL (ref 6.1–8.1)

## 2018-05-10 LAB — CARBAMAZEPINE LEVEL, TOTAL: Carbamazepine Lvl: 10.8 mg/L (ref 4.0–12.0)

## 2018-05-10 LAB — LAMOTRIGINE LEVEL: Lamotrigine Lvl: 2.7 ug/mL — ABNORMAL LOW (ref 4.0–18.0)

## 2018-05-11 ENCOUNTER — Telehealth: Payer: Self-pay | Admitting: Neurology

## 2018-05-11 DIAGNOSIS — R270 Ataxia, unspecified: Secondary | ICD-10-CM

## 2018-05-11 DIAGNOSIS — S069X5D Unspecified intracranial injury with loss of consciousness greater than 24 hours with return to pre-existing conscious level, subsequent encounter: Secondary | ICD-10-CM

## 2018-05-11 DIAGNOSIS — Z79899 Other long term (current) drug therapy: Secondary | ICD-10-CM

## 2018-05-11 NOTE — Telephone Encounter (Signed)
Patient was needing to see if his Lab results were available? Please Call. Thanks

## 2018-05-13 NOTE — Addendum Note (Signed)
Addended by: Dorthy Cooler on: 05/13/2018 03:28 PM   Modules accepted: Orders

## 2018-05-13 NOTE — Telephone Encounter (Signed)
I reviewed the labs: Lamictal level is actually low (2.7). Carbamazepine level is normal (10.8) His sodium level is a little low.   He is not toxic but maybe the low sodium level may be causing the unsteady walking. My recommendation would be to slightly decrease dose of carbamazepine (carbamazepine may lower sodium levels) and increase lamictal: Increase Lamictal to  twice daily Decrease carbamazepine to  in AM and  at night Recheck CMP, carbamazepine level and lamotrigine level in one week. I also want to check MRI of brain without contrast to further evaluate ataxia.

## 2018-05-13 NOTE — Telephone Encounter (Signed)
Wanting the lab results please call thank you

## 2018-05-13 NOTE — Telephone Encounter (Signed)
Please advise 

## 2018-05-13 NOTE — Telephone Encounter (Signed)
Jaffe patient.  

## 2018-05-13 NOTE — Telephone Encounter (Signed)
Called and spoke with Sunny Schlein, [name] caregiver. Advised her of medication changes, labs in 1 week and MRI. She repeated medication changes to me and verbalized understanding.

## 2018-05-14 ENCOUNTER — Ambulatory Visit: Payer: Self-pay | Admitting: Neurology

## 2018-05-14 ENCOUNTER — Encounter

## 2018-05-14 DIAGNOSIS — R3912 Poor urinary stream: Secondary | ICD-10-CM | POA: Diagnosis not present

## 2018-05-14 DIAGNOSIS — R3914 Feeling of incomplete bladder emptying: Secondary | ICD-10-CM | POA: Diagnosis not present

## 2018-05-14 DIAGNOSIS — N401 Enlarged prostate with lower urinary tract symptoms: Secondary | ICD-10-CM | POA: Diagnosis not present

## 2018-05-21 ENCOUNTER — Other Ambulatory Visit: Payer: PPO

## 2018-05-21 ENCOUNTER — Telehealth: Payer: Self-pay | Admitting: Neurology

## 2018-05-21 DIAGNOSIS — Z79899 Other long term (current) drug therapy: Secondary | ICD-10-CM

## 2018-05-21 NOTE — Telephone Encounter (Signed)
Patient has not heard from anyone about the scheduling the MRI

## 2018-05-22 ENCOUNTER — Telehealth: Payer: Self-pay | Admitting: Neurology

## 2018-05-22 NOTE — Telephone Encounter (Signed)
[name] called regarding the MRI appointment and the shunt that was put it

## 2018-05-22 NOTE — Telephone Encounter (Signed)
Called and [facility] with [facility]. She was scheduling with [facility] and told them Pt has shunt from 2012 TBI. They wanted serial number and date implanted. Advised her we do not have that information, but Dr Zachary Thornton is who originally referred him to us, his office may have, or she will need to call  Neuro[place]geon in [place] who implanted.

## 2018-05-22 NOTE — Telephone Encounter (Signed)
Called [facility], spoke with [place] She is calling Pt to schedule today.

## 2018-05-25 ENCOUNTER — Telehealth: Payer: Self-pay | Admitting: Neurology

## 2018-05-25 LAB — COMPREHENSIVE METABOLIC PANEL
AG Ratio: 2.1 (calc) (ref 1.0–2.5)
ALBUMIN MSPROF: 4.7 g/dL (ref 3.6–5.1)
ALKALINE PHOSPHATASE (APISO): 107 U/L (ref 40–115)
ALT: 16 U/L (ref 9–46)
AST: 15 U/L (ref 10–35)
BUN: 8 mg/dL (ref 7–25)
CALCIUM: 9.7 mg/dL (ref 8.6–10.3)
CO2: 24 mmol/L (ref 20–32)
CREATININE: 0.77 mg/dL (ref 0.70–1.33)
Chloride: 98 mmol/L (ref 98–110)
Globulin: 2.2 g/dL (calc) (ref 1.9–3.7)
Glucose, Bld: 90 mg/dL (ref 65–99)
POTASSIUM: 4.7 mmol/L (ref 3.5–5.3)
Sodium: 135 mmol/L (ref 135–146)
Total Bilirubin: 0.4 mg/dL (ref 0.2–1.2)
Total Protein: 6.9 g/dL (ref 6.1–8.1)

## 2018-05-25 LAB — LAMOTRIGINE LEVEL: LAMOTRIGINE LVL: 4.1 ug/mL (ref 4.0–18.0)

## 2018-05-25 LAB — CARBAMAZEPINE LEVEL, TOTAL: Carbamazepine Lvl: 9.7 mg/L (ref 4.0–12.0)

## 2018-05-25 NOTE — Telephone Encounter (Signed)
Pt wants to know if lab results are available and if so to please call with those

## 2018-05-25 NOTE — Telephone Encounter (Signed)
Pt is also concerned with the amout of fluid that is being consumed throughout the day and is questioning the sodium

## 2018-05-27 NOTE — Telephone Encounter (Signed)
Called and spoke with caregiver, Sunny SchleinFelicia. Advised her labs are all within normal range, though Na is on the low normal range. She and family are concerned about the amount of liquids Pt is consuming and his hx of low Na. She said they have called urologist due to input/output amounts. Pt will not let anyone be with him in the restroom and they have been unable to get accurate amounts. I suggested a "hat" to go in the toilet that would need to be recorded after each visit to the toilet. They are waiting on call back from urologist also.  Should he have CMP again?

## 2018-05-28 ENCOUNTER — Other Ambulatory Visit: Payer: PPO

## 2018-05-28 ENCOUNTER — Telehealth: Payer: Self-pay | Admitting: Neurology

## 2018-05-28 MED ORDER — LAMOTRIGINE 100 MG PO TABS: 150.0000 mg | ORAL_TABLET | Freq: Two times a day (BID) | ORAL | 3 refills | Status: DC

## 2018-05-28 NOTE — Telephone Encounter (Signed)
This is outside my scope of practice.  I really think this needs to be addressed with his urologist and PCP

## 2018-05-28 NOTE — Telephone Encounter (Signed)
[name] rtrnd call. Advised her to call PCP. They are already talking with urologist.

## 2018-05-28 NOTE — Telephone Encounter (Signed)
Called and LMOVM for [name] to rtrn my call

## 2018-05-28 NOTE — Telephone Encounter (Signed)
[name] called on the patient's behalf. Needs refill on Lamictal 90 day supply [facility]. Thanks

## 2018-06-02 DIAGNOSIS — E7849 Other hyperlipidemia: Secondary | ICD-10-CM | POA: Diagnosis not present

## 2018-06-04 ENCOUNTER — Other Ambulatory Visit: Payer: Self-pay

## 2018-06-04 ENCOUNTER — Telehealth: Payer: Self-pay | Admitting: Neurology

## 2018-06-04 MED ORDER — CARBAMAZEPINE 200 MG PO TABS: 200.0000 mg | ORAL_TABLET | ORAL | 3 refills | Status: DC

## 2018-06-04 NOTE — Telephone Encounter (Signed)
[facility] Dr Waynard EdwardsPerini was unaware of cabamazepine dose change, and pharmacy sent him a refill request. I sent in new Rx for correct dose.

## 2018-06-05 ENCOUNTER — Telehealth: Payer: Self-pay | Admitting: Neurology

## 2018-06-05 MED ORDER — CARBAMAZEPINE ER 300 MG PO CP12: ORAL_CAPSULE | ORAL | 1 refills | Status: DC

## 2018-06-05 MED ORDER — CARBAMAZEPINE ER 200 MG PO TB12: 600.0000 mg | ORAL_TABLET | Freq: Two times a day (BID) | ORAL | 1 refills | Status: DC

## 2018-06-05 NOTE — Telephone Encounter (Signed)
XR medication sent in to pharmacy with note of clarification.

## 2018-06-05 NOTE — Telephone Encounter (Signed)
If he has been taking XR, then we will keep it XR

## 2018-06-05 NOTE — Telephone Encounter (Signed)
Received fax from the pharmacy stating patient has been on Carbamazepine XR - but recent RX sent was not XR version. Please clarify what you want patient to be taking.

## 2018-06-05 NOTE — Telephone Encounter (Signed)
[facility] who states patient should be on 300 in the AM, 600 at night. Clarifying with Dr. Everlena CooperJaffe how to order this in XR.

## 2018-06-05 NOTE — Telephone Encounter (Signed)
RX sent to pharmacy  

## 2018-06-23 ENCOUNTER — Ambulatory Visit (INDEPENDENT_AMBULATORY_CARE_PROVIDER_SITE_OTHER): Payer: PPO | Admitting: Neurology

## 2018-06-23 ENCOUNTER — Encounter: Payer: Self-pay | Admitting: Neurology

## 2018-06-23 VITALS — BP 108/74 | HR 103 | Ht 69.0 in | Wt 154.0 lb

## 2018-06-23 DIAGNOSIS — G40109 Localization-related (focal) (partial) symptomatic epilepsy and epileptic syndromes with simple partial seizures, not intractable, without status epilepticus: Secondary | ICD-10-CM | POA: Diagnosis not present

## 2018-06-23 DIAGNOSIS — S069X5D Unspecified intracranial injury with loss of consciousness greater than 24 hours with return to pre-existing conscious level, subsequent encounter: Secondary | ICD-10-CM

## 2018-06-23 MED ORDER — LAMOTRIGINE 100 MG PO TABS: 200.0000 mg | ORAL_TABLET | Freq: Two times a day (BID) | ORAL | 3 refills | Status: DC

## 2018-06-23 NOTE — Progress Notes (Signed)
NEUROLOGY FOLLOW UP OFFICE NOTE  Zachary LoosenScott Gwaltney [id number]  HISTORY OF PRESENT ILLNESS: Zachary Thornton is a 52 year old man with TBI and history of GI bleed who follows up for seizure.  History supplemented by a family friend who accompanies him.    UPDATE: Current medications:  Lamotrigine 150mg  twice daily, carbamazepine XR 300mg  in AM and 600mg  in PM.   He underwent evaluation of ataxia in April.  Labs from 05/06/18 showed low lamotrigine level of 2.7, normal carbamazepine level of 10.8, and CMP with low Na of 127.  Due to hyponatremia as possible cause of ataxia and dizziness, carbamazepine XR was slightly decreased from 600mg  twice daily to 300mg  in AM and 600mg  in PM.  Lamotrigine was increased from 100mg  twice daily to 150mg  twice daily.  Repeat labs from 05/21/18 showed lamotrigine level of 4.1, carbamazepine level of 9.7 and CMP with Na of 135.  His ataxia and dizziness has resolved.  Since last visit, he had two seizures, one in May and one two weeks ago.   HISTORY: He had a traumatic brain injury in 2012 after falling and hitting the back of his head on concrete.  He fractured his skull and sustained intracranial bleeding, requiring right craniectomy and VP shunt. [name].  He was in a coma for 6 weeks.  After 2 month hospitalization, he was discharged to inpatient rehab facility where he had to relearn how to walk, talk and feed himself.  His wife was unable to care for him and he subsequently moved in with his mother, who is his guardian.  His [place] and [place].   He was hospitalized in December for GI bleed due to duodenal ulcer, requiring multiple blood transfusions.   He presented to the ED on 04/29/16 following an unwitnessed fall in which he hit his head.  He did not recall the event.  His mother heard him fall in the bathroom and found him on the floor with eyes rolled back, unresponsive with flexed posturing and shaking of both upper extremities, lasting about 2 minutes.  He  had bowel and bladder incontinence.  He was slightly confused afterwards for about 10 minutes.  He did not exhibit foaming at the mouth or tongue biting.  CT of head was personally reviewed and showed chronic changes of traumatic brain injury with bifrontal and temporal encephalomalacia, as well as post surgical changes of right craniectomy with cranioplasty and left frontal approach VP shunt.  No evidence of hydrocephalus seen.  He was started on Keppra.  He has not had any recurrent spells.  He was found to have worsening anemia and received further transfusions by his PCP.  He does not drive.     He was admitted to [facility] from 03/23/17 to 03/24/17 for syncope.  He had a brief syncopal episode at church.  His face became pale.  He did not exhibit seizure-like activity.  It was brief.  He was found to be hypotensive by EMS with a BP of 60/30 and was given IV fluids, which improved blood pressure.  He was not orthostatic in the hospital.  Work up in the hospital included head CT with no acute findings, EEG with right hemispheric slowing (which could be due to underlying physiologic abnormality from encephalomalacia and surgery), but no epileptiform discharges, and unremarkable EKG and troponins.  My suspicion is that this was not a seizure.   He had a seizure on 12/10/17, described as staring spell of  unresponsiveness followed by generalized tonic-clonic activity with urinary incontinence.  ED note mentions he was not taking Keppra, but his caregiver disputes this.  Carbamazepine level was 12.6.  Na was 131, K 4 and glucose 118.  UA was negative for infection.  He was loaded with Keppra and discharged on Keppra 1000mg  twice daily.  Since then, he has had increased irritability and verbally abusive  Past medication:  Keppra (irritability)   He has no history of seizures prior to 2017.  PAST MEDICAL HISTORY: Past Medical History:  Diagnosis Date  . Back injury   . Blood loss anemia   .  Dementia with behavioral disturbance   . Duodenal ulcer hemorrhage   . GI bleed   . Seizures (HCC)   . TBI (traumatic brain injury) [facility])     MEDICATIONS: Current Outpatient Medications on File Prior to Visit  Medication Sig Dispense Refill  . atorvastatin (LIPITOR) 20 MG tablet Take 20 mg by mouth daily.    . carbamazepine (CARBATROL) 300 MG 12 hr capsule 1 in the morning, 2 in the evening 270 capsule 1  . carbamazepine (TEGRETOL-XR) 200 MG 12 hr tablet Take 3 tablets (600 mg total) by mouth 2 (two) times daily. 540 tablet 1  . cholecalciferol (VITAMIN D) 1000 UNITS tablet Take 1,000 Units by mouth daily.    . Cyanocobalamin (HM SUPER VITAMIN B12 PO) Take 1 tablet by mouth daily.     [name] Calcium (STOOL SOFTENER PO) Take 2 tablets by mouth daily.    [name] escitalopram (LEXAPRO) 20 MG tablet Take 20 mg by mouth daily.    . Multiple Vitamin (MULTIVITAMIN WITH MINERALS) TABS Take 1 tablet by mouth daily.    . pantoprazole (PROTONIX) 40 MG tablet TAKE 1 TABLET TWICE DAILY. 60 tablet 0  . tamsulosin (FLOMAX) 0.4 MG CAPS capsule Take 1 capsule (0.4 mg total) by mouth daily after breakfast. 30 capsule 0   No current facility-administered medications on file prior to visit.     ALLERGIES: Allergies  Allergen Reactions  . Nuedexta [Dextromethorphan-Quinidine] Anaphylaxis    Lips began to swell  . Nsaids Other (See Comments)    Due to GI bleeding    FAMILY HISTORY: Family History  Problem Relation Age of Onset  . Heart disease Father        MVR  . Hypertension Mother   . Stroke Maternal Aunt     SOCIAL HISTORY: Social History   Socioeconomic History  . Marital status: Married    Spouse name: Not on file  . Number of children: 1  . Years of education: Not on file  . Highest education level: Not on file  Occupational History  . Occupation: Disabled  Social Needs  . Financial resource strain: Not on file  . Food insecurity:    Worry: Not on file    Inability: Not on  file  . Transportation needs:    Medical: Not on file    Non-medical: Not on file  Tobacco Use  . Smoking status: Former Games developer  . Smokeless tobacco: Never Used  Substance and Sexual Activity  . Alcohol use: No    Alcohol/week: 0.0 oz  . Drug use: No  . Sexual activity: Yes  Lifestyle  . Physical activity:    Days per week: Not on file    Minutes per session: Not on file  . Stress: Not on file  Relationships  . Social connections:    Talks on phone: Not on file  Gets together: Not on file    Attends religious service: Not on file    Active member of club or organization: Not on file    Attends meetings of clubs or organizations: Not on file    Relationship status: Not on file  . Intimate partner violence:    Fear of current or ex partner: Not on file    Emotionally abused: Not on file    Physically abused: Not on file    Forced sexual activity: Not on file  Other Topics Concern  . Not on file  Social History Narrative   Lives with his mother.  Normally ambulates without assistance.      REVIEW OF SYSTEMS: Constitutional: No fevers, chills, or sweats, no generalized fatigue, change in appetite Eyes: No visual changes, double vision, eye pain Ear, nose and throat: No hearing loss, ear pain, nasal congestion, sore throat Cardiovascular: No chest pain, palpitations Respiratory:  No shortness of breath at rest or with exertion, wheezes GastrointestinaI: No nausea, vomiting, diarrhea, abdominal pain, fecal incontinence Genitourinary:  No dysuria, urinary retention or frequency Musculoskeletal:  No neck pain, back pain Integumentary: No rash, pruritus, skin lesions Neurological: as above Psychiatric: No depression, insomnia, anxiety Endocrine: No palpitations, fatigue, diaphoresis, mood swings, change in appetite, change in weight, increased thirst Hematologic/Lymphatic:  No purpura, petechiae. Allergic/Immunologic: no itchy/runny eyes, nasal congestion, recent allergic  reactions, rashes  PHYSICAL EXAM: Vitals:   06/23/18 1135  BP: 108/74  Pulse: (!) 103  SpO2: 95%   General: No acute distress.  Patient appears well-groomed.   Head:  Normocephalic/atraumatic Eyes:  Fundi examined but not visualized Neck: supple, no paraspinal tenderness, full range of motion Heart:  Regular rate and rhythm Lungs:  Clear to auscultation bilaterally Back: No paraspinal tenderness Neurological Exam: alert and oriented to person and place, knows year but not date (said it was November 7). Attention span and concentration intact, recent and remote memory intact, fund of knowledge intact.  Speech fluent and not dysarthric, language intact.  End-gaze horizontal nystagmus, saccadic eye movements with tracking.  Otherwise, CN II-XII intact. Bulk and tone normal, muscle strength 5/5 throughout.  Sensation to light touch  intact.  Deep tendon reflexes 2+ throughout.  Finger to nose testing with mild dysmetria bilaterally.  Left hemiplegic gait.  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  PLAN: 1.  Continue carbamazepine XR 300mg  in morning and 600mg  at night 2.  We will INCREASE lamotrigine to 200mg  twice daily 3.  Follow up in 3 months.  18 minutes spent face to face with patient, over 50% spent discussing management.  Shon Millet, DO  CC:  Dr. Waynard Edwards

## 2018-06-23 NOTE — Patient Instructions (Signed)
1.  Continue carbamazepine XR 300mg  in morning and 600mg  at night 2.  We will INCREASE lamotrigine to 200mg  twice daily 3.  Follow up in 3 months.  1. If medication has been prescribed for you to prevent seizures, take it exactly as directed.  Do not stop taking the medicine without talking to your doctor first, even if you have not had a seizure in a long time.   2. Avoid activities in which a seizure would cause danger to yourself or to others.  Don't operate dangerous machinery, swim alone, or climb in high or dangerous places, such as on ladders, roofs, or girders.  Do not drive unless your doctor says you may.  3. If you have any warning that you may have a seizure, lay down in a safe place where you can't hurt yourself.    4.  No driving for 6 months from last seizure, as per [place].   Please refer to the following link on the Epilepsy Foundation of America's website for more information: http://www.epilepsyfoundation.org/answerplace/Social/driving/drivingu.cfm   5.  Maintain good sleep hygiene.  6.  Notify your neurology if you are planning pregnancy or if you become pregnant.  7.  Contact your doctor if you have any problems that may be related to the medicine you are taking.  8.  Call 911 and bring the patient back to the ED if:        A.  The seizure lasts longer than 5 minutes.       B.  The patient doesn't awaken shortly after the seizure  C.  The patient has new problems such as difficulty seeing, speaking or moving  D.  The patient was injured during the seizure  E.  The patient has a temperature over 102 F (39C)  F.  The patient vomited and now is having trouble breathing

## 2018-07-30 DIAGNOSIS — R634 Abnormal weight loss: Secondary | ICD-10-CM | POA: Diagnosis not present

## 2018-07-30 DIAGNOSIS — D6489 Other specified anemias: Secondary | ICD-10-CM | POA: Diagnosis not present

## 2018-07-30 DIAGNOSIS — Z6822 Body mass index (BMI) 22.0-22.9, adult: Secondary | ICD-10-CM | POA: Diagnosis not present

## 2018-07-30 DIAGNOSIS — R569 Unspecified convulsions: Secondary | ICD-10-CM | POA: Diagnosis not present

## 2018-07-30 DIAGNOSIS — Z982 Presence of cerebrospinal fluid drainage device: Secondary | ICD-10-CM | POA: Diagnosis not present

## 2018-10-07 NOTE — Progress Notes (Signed)
NEUROLOGY FOLLOW UP OFFICE NOTE  Zachary Thornton [id number]  HISTORY OF PRESENT ILLNESS: Zachary Thornton is a 52 year old man with traumatic brain injury and history of GI bleed who follows up for seizure.  He is accompanied by a family friend who supplements history.  UPDATE:  Current medications: Lamotrigine 200 mg twice daily, carbamazepine XR 300 mg in a.m. and 600 mg in p.m (600mg  twice daily caused hyponatremia).  Overall he is doing well.  No recurrent seizures.  His mother is concerned that he seems a little unsteady on his feet.  He also has reportedly lost some weight.    HISTORY: He had a traumatic brain injury in 2012 after falling and hitting the back of his head on concrete.  He fractured his skull and sustained intracranial bleeding, requiring right craniectomy and VP shunt. Zachary Thornton  He was in a coma for 6 weeks.  After 2 month hospitalization, he was discharged to inpatient rehab facility where he had to relearn how to walk, talk and feed himself.  His wife was unable to care for him and he subsequently moved in with his mother, who is his guardian.  His wife and son live in [place].   He was hospitalized in December for GI bleed due to duodenal ulcer, requiring multiple blood transfusions.  He presented to the ED on 04/29/16 following an unwitnessed fall in which he hit his head.  He did not recall the event.  His mother heard him fall in the bathroom and found him on the floor with eyes rolled back, unresponsive with flexed posturing and shaking of both upper extremities, lasting about 2 minutes.  He had bowel and bladder incontinence.  He was slightly confused afterwards for about 10 minutes.  He did not exhibit foaming at the mouth or tongue biting.  CT of head was personally reviewed and showed chronic changes of traumatic brain injury with bifrontal and temporal encephalomalacia, as well as post surgical changes of right craniectomy with cranioplasty and left frontal approach VP  shunt.  No evidence of hydrocephalus seen.  He was started on Keppra.  He has not had any recurrent spells.  He was found to have worsening anemia and received further transfusions by his PCP.  He does not drive.    He was admitted to [facility] from 03/23/17 to 03/24/17 for syncope.  He had a brief syncopal episode at church.  His face became pale.  He did not exhibit seizure-like activity.  It was brief.  He was found to be hypotensive by EMS with a BP of 60/30 and was given IV fluids, which improved blood pressure.  He was not orthostatic in the hospital.  Work up in the hospital included head CT with no acute findings, EEG with right hemispheric slowing (which could be due to underlying physiologic abnormality from encephalomalacia and surgery), but no epileptiform discharges, and unremarkable EKG and troponins.  My suspicion is that this was not a seizure.  He had a seizure on 12/10/17, described as staring spell of unresponsiveness followed by generalized tonic-clonic activity with urinary incontinence.  ED note mentions he was not taking Keppra, but his caregiver disputes this.  Carbamazepine level was 12.6.  Na was 131, K 4 and glucose 118.  UA was negative for infection.  He was loaded with Keppra and discharged on Keppra 1000mg  twice daily.  Since then, he has had increased irritability and verbally abusive  Past medication:  Keppra (irritability)  He has no  history of seizures prior to 2017.  PAST MEDICAL HISTORY: Past Medical History:  Diagnosis Date  . Back injury   . Blood loss anemia   . Dementia with behavioral disturbance   . Duodenal ulcer hemorrhage   . GI bleed   . Seizures (HCC)   . TBI (traumatic brain injury) [facility])     MEDICATIONS: Current Outpatient Medications on File Prior to Visit  Medication Sig Dispense Refill  . atorvastatin (LIPITOR) 20 MG tablet Take 20 mg by mouth daily.    . carbamazepine (CARBATROL) 300 MG 12 hr capsule 1 in the morning,  2 in the evening 270 capsule 1  . carbamazepine (TEGRETOL-XR) 200 MG 12 hr tablet Take 3 tablets (600 mg total) by mouth 2 (two) times daily. 540 tablet 1  . cholecalciferol (VITAMIN D) 1000 UNITS tablet Take 1,000 Units by mouth daily.    . Cyanocobalamin (HM SUPER VITAMIN B12 PO) Take 1 tablet by mouth daily.     [name] Calcium (STOOL SOFTENER PO) Take 2 tablets by mouth daily.    Zachary Thornton escitalopram (LEXAPRO) 20 MG tablet Take 20 mg by mouth daily.    Zachary Thornton lamoTRIgine (LAMICTAL) 100 MG tablet Take 2 tablets (200 mg total) by mouth 2 (two) times daily. 120 tablet 3  . Multiple Vitamin (MULTIVITAMIN WITH MINERALS) TABS Take 1 tablet by mouth daily.    . pantoprazole (PROTONIX) 40 MG tablet TAKE 1 TABLET TWICE DAILY. 60 tablet 0  . tamsulosin (FLOMAX) 0.4 MG CAPS capsule Take 1 capsule (0.4 mg total) by mouth daily after breakfast. 30 capsule 0   No current facility-administered medications on file prior to visit.     ALLERGIES: Allergies  Allergen Reactions  . Nuedexta [Dextromethorphan-Quinidine] Anaphylaxis    Lips began to swell  . Nsaids Other (See Comments)    Due to GI bleeding    FAMILY HISTORY: Family History  Problem Relation Age of Onset  . Heart disease Father        MVR  . Hypertension Mother   . Stroke Maternal Aunt    SOCIAL HISTORY: Social History   Socioeconomic History  . Marital status: Married    Spouse name: Not on file  . Number of children: 1  . Years of education: Not on file  . Highest education level: Not on file  Occupational History  . Occupation: Disabled  Social Needs  . Financial resource strain: Not on file  . Food insecurity:    Worry: Not on file    Inability: Not on file  . Transportation needs:    Medical: Not on file    Non-medical: Not on file  Tobacco Use  . Smoking status: Former Games developer  . Smokeless tobacco: Never Used  Substance and Sexual Activity  . Alcohol use: No    Alcohol/week: 0.0 standard drinks  . Drug use: No  .  Sexual activity: Yes  Lifestyle  . Physical activity:    Days per week: Not on file    Minutes per session: Not on file  . Stress: Not on file  Relationships  . Social connections:    Talks on phone: Not on file    Gets together: Not on file    Attends religious service: Not on file    Active member of club or organization: Not on file    Attends meetings of clubs or organizations: Not on file    Relationship status: Not on file  . Intimate partner violence:    Fear  of current or ex partner: Not on file    Emotionally abused: Not on file    Physically abused: Not on file    Forced sexual activity: Not on file  Other Topics Concern  . Not on file  Social History Narrative   Lives with his mother.  Normally ambulates without assistance.      REVIEW OF SYSTEMS: Constitutional: No fevers, chills, or sweats, no generalized fatigue, change in appetite Eyes: No visual changes, double vision, eye pain Ear, nose and throat: No hearing loss, ear pain, nasal congestion, sore throat Cardiovascular: No chest pain, palpitations Respiratory:  No shortness of breath at rest or with exertion, wheezes GastrointestinaI: No nausea, vomiting, diarrhea, abdominal pain, fecal incontinence Genitourinary:  No dysuria, urinary retention or frequency Musculoskeletal:  No neck pain, back pain Integumentary: No rash, pruritus, skin lesions Neurological: as above Psychiatric: No depression, insomnia, anxiety Endocrine: No palpitations, fatigue, diaphoresis, mood swings, change in appetite, change in weight, increased thirst Hematologic/Lymphatic:  No purpura, petechiae. Allergic/Immunologic: no itchy/runny eyes, nasal congestion, recent allergic reactions, rashes  PHYSICAL EXAM: Blood pressure 110/80, pulse (!) 110, temperature 98.9 F (37.2 C), height 5\' 8"  (1.727 m), weight 158 lb (71.7 kg), SpO2 94 %. General: No acute distress.  Patient appears well-groomed.  Head:  Normocephalic/atraumatic Eyes:   Fundi examined but not visualized Neck: supple, no paraspinal tenderness, full range of motion Heart:  Regular rate and rhythm Lungs:  Clear to auscultation bilaterally Back: No paraspinal tenderness Neurological Exam: alert and oriented to person and place. Attention span and concentration intact, recent and remote memory intact, fund of knowledge intact.  Speech fluent and not dysarthric, language intact.  End-gaze horizontal nystagmus, saccadic eye movements with tracking.  Otherwise, CN II-XII intact. Bulk and tone normal, muscle strength 5/5 throughout.  Sensation to light touch intact.  Deep tendon reflexes 2+ throughout, toes downgoing.  Finger to nose and heel to shin testing intact.  Left hemiplegic gait with reduced left arm swing. 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  Continue carbamazepine XR 300 mg in the morning and 600 mg at night 2.  Continue lamotrigine 200 mg twice daily 3.  Recheck BMP (Na level) 4.  Follow-up in 4 months  19 minutes spent face to face with patient, over 50% spent discussing management.  Shon Millet, DO  CC: [name], MD

## 2018-10-08 ENCOUNTER — Encounter: Payer: Self-pay | Admitting: Neurology

## 2018-10-08 ENCOUNTER — Ambulatory Visit (INDEPENDENT_AMBULATORY_CARE_PROVIDER_SITE_OTHER): Payer: PPO | Admitting: Neurology

## 2018-10-08 ENCOUNTER — Other Ambulatory Visit (INDEPENDENT_AMBULATORY_CARE_PROVIDER_SITE_OTHER): Payer: PPO

## 2018-10-08 VITALS — BP 110/80 | HR 110 | Temp 98.9°F | Ht 68.0 in | Wt 158.0 lb

## 2018-10-08 DIAGNOSIS — S069X5D Unspecified intracranial injury with loss of consciousness greater than 24 hours with return to pre-existing conscious level, subsequent encounter: Secondary | ICD-10-CM | POA: Diagnosis not present

## 2018-10-08 DIAGNOSIS — G40109 Localization-related (focal) (partial) symptomatic epilepsy and epileptic syndromes with simple partial seizures, not intractable, without status epilepticus: Secondary | ICD-10-CM

## 2018-10-08 DIAGNOSIS — E871 Hypo-osmolality and hyponatremia: Secondary | ICD-10-CM

## 2018-10-08 LAB — BASIC METABOLIC PANEL
BUN: 8 mg/dL (ref 7–25)
CALCIUM: 9.1 mg/dL (ref 8.6–10.3)
CHLORIDE: 92 mmol/L — AB (ref 98–110)
CO2: 27 mmol/L (ref 20–32)
CREATININE: 0.79 mg/dL (ref 0.70–1.33)
Glucose, Bld: 116 mg/dL — ABNORMAL HIGH (ref 65–99)
Potassium: 4.3 mmol/L (ref 3.5–5.3)
Sodium: 131 mmol/L — ABNORMAL LOW (ref 135–146)

## 2018-10-08 NOTE — Patient Instructions (Addendum)
1.  Continue carbamazepine XR 300mg  in morning and 600mg  in evening 2.  Continue lamotrigine 200mg  twice daily 3.  Check BMP lab today 4.  Follow up in 4 months  Your provider has requested that you have labwork completed today. Please go to [facility] ([id number]) on the second floor of this building before leaving the office today. You do not need to check in. If you are not called within 15 minutes please check with the front desk.

## 2018-10-09 ENCOUNTER — Telehealth: Payer: Self-pay | Admitting: *Deleted

## 2018-10-09 ENCOUNTER — Other Ambulatory Visit: Payer: Self-pay | Admitting: *Deleted

## 2018-10-09 ENCOUNTER — Encounter: Payer: Self-pay | Admitting: *Deleted

## 2018-10-09 DIAGNOSIS — R55 Syncope and collapse: Secondary | ICD-10-CM

## 2018-10-09 DIAGNOSIS — E871 Hypo-osmolality and hyponatremia: Secondary | ICD-10-CM

## 2018-10-09 NOTE — Telephone Encounter (Signed)
Results and instructions sent via My Chart.   Lab orders mailed to patient.

## 2018-10-09 NOTE — Telephone Encounter (Signed)
-----   Message from Vivien Rota, LPN sent at 16/09/9603 10:15 AM EDT -----   ----- Message ----- From: Drema Dallas, DO Sent: 10/09/2018   6:54 AM EDT To: Dorthy Cooler, CMA  Sodium level is a little low, which is expected.  However, it is not so low that I would typically be concerned or would typically cause symptoms.  It is 131.  Typically, I become more concerned if it is below 130.  We can repeat BMP in February just prior to his follow up.

## 2018-10-19 DIAGNOSIS — H00013 Hordeolum externum right eye, unspecified eyelid: Secondary | ICD-10-CM | POA: Diagnosis not present

## 2018-10-26 ENCOUNTER — Other Ambulatory Visit: Payer: Self-pay | Admitting: Neurology

## 2019-01-27 ENCOUNTER — Other Ambulatory Visit (INDEPENDENT_AMBULATORY_CARE_PROVIDER_SITE_OTHER): Payer: PPO

## 2019-01-27 DIAGNOSIS — R55 Syncope and collapse: Secondary | ICD-10-CM | POA: Diagnosis not present

## 2019-01-27 DIAGNOSIS — E871 Hypo-osmolality and hyponatremia: Secondary | ICD-10-CM

## 2019-01-27 DIAGNOSIS — B356 Tinea cruris: Secondary | ICD-10-CM | POA: Diagnosis not present

## 2019-01-27 DIAGNOSIS — Z6822 Body mass index (BMI) 22.0-22.9, adult: Secondary | ICD-10-CM | POA: Diagnosis not present

## 2019-01-27 LAB — BASIC METABOLIC PANEL
BUN: 14 mg/dL (ref 6–23)
CHLORIDE: 98 meq/L (ref 96–112)
CO2: 29 mEq/L (ref 19–32)
Calcium: 9.6 mg/dL (ref 8.4–10.5)
Creatinine, Ser: 0.84 mg/dL (ref 0.40–1.50)
GFR: 95.71 mL/min (ref >60.00)
Glucose, Bld: 92 mg/dL (ref 70–99)
POTASSIUM: 4.7 meq/L (ref 3.5–5.1)
SODIUM: 136 meq/L (ref 135–145)

## 2019-02-08 NOTE — Progress Notes (Signed)
NEUROLOGY FOLLOW UP OFFICE NOTE  Zachary Thornton [id number]  HISTORY OF PRESENT ILLNESS: Zachary Thornton is a 53 year old man with traumatic brain injury and history of GI bleed who follows up for seizure.  He is accompanied by a family friend who supplements history.  UPDATE: Current medications: Lamotrigine 200 mg twice daily, carbamazepine XR 300 mg in a.m. and 600 mg in p.m (600mg  twice daily caused hyponatremia).  BMP from October showed sodium level of 131.  Repeat level on 01/27/2019 was 136.  Since last visit, he had one spell where he had a staring spell lasting about 3 minutes.  EMS was called and they evaluated him on the scene.  He did not require transport to the ED.    HISTORY: He had a traumatic brain injury in 2012 after falling and hitting the back of his head on concrete. He fractured his skull and sustained intracranial bleeding, requiring right craniectomy and VP shunt. [name] He was in a coma for 6 weeks. After 2 month hospitalization, he was discharged to inpatient rehab facility where he had to relearn how to walk, talk and feed himself. His wife was unable to care for him and he subsequently moved in with his mother, who is his guardian. His wife and son live in [place]. He was hospitalized in December for GI bleed due to duodenal ulcer, requiring multiple blood transfusions.  He presented to the ED on 04/29/16 following an unwitnessed fall in which he hit his head. He did not recall the event. His mother heard him fall in the bathroom and found him on the floor with eyes rolled back, unresponsive with flexed posturing and shaking of both upper extremities, lasting about 2 minutes. He had bowel and bladder incontinence. He was slightly confused afterwards for about 10 minutes. He did not exhibit foaming at the mouth or tongue biting. CT of head was personally reviewed and showed chronic changes of traumatic brain injury with bifrontal and temporal  encephalomalacia, as well as post surgical changes of right craniectomy with cranioplasty and left frontal approach VP shunt. No evidence of hydrocephalus seen. He was started on Keppra. He has not had any recurrent spells. He was found to have worsening anemia and received further transfusions by his PCP. He does not drive.   He was admitted to [facility] from 03/23/17 to 03/24/17 for syncope. He had a brief syncopal episode at church. His face became pale. He did not exhibit seizure-like activity. It was brief. He was found to be hypotensive by EMS with a BP of 60/30 and was given IV fluids, which improved blood pressure. He was not orthostatic in the hospital. Work up in the hospital included head CT with no acute findings, EEG with right hemispheric slowing (which could be due to underlying physiologic abnormality from encephalomalacia and surgery), but no epileptiform discharges, and unremarkable EKG and troponins. My suspicion is that this was not a seizure.  He had a seizure on 12/10/17, described as staring spell of unresponsiveness followed by generalized tonic-clonic activity with urinary incontinence. ED note mentions he was not taking Keppra, but his caregiver disputes this. Carbamazepine level was 12.6. Na was 131, K 4 and glucose 118. UA was negative for infection. He was loaded with Keppra and discharged on Keppra 1000mg  twice daily. Since then, he has had increased irritability and verbally abusive  Past medication: Keppra (irritability)  He has no history of seizures prior to 2017.  PAST MEDICAL HISTORY: Past Medical  History:  Diagnosis Date  . Back injury   . Blood loss anemia   . Dementia with behavioral disturbance (HCC)   . Duodenal ulcer hemorrhage   . GI bleed   . Seizures (HCC)   . TBI (traumatic brain injury) [facility])     MEDICATIONS: Current Outpatient Medications on File Prior to Visit  Medication Sig Dispense Refill  .  atorvastatin (LIPITOR) 20 MG tablet Take 20 mg by mouth daily.    . carbamazepine (CARBATROL) 300 MG 12 hr capsule TAKE 1 CAPSULE IN THE MORNING AND 2 CAPSULES IN THE EVENING. 90 capsule 5  . carbamazepine (TEGRETOL-XR) 200 MG 12 hr tablet Take 3 tablets (600 mg total) by mouth 2 (two) times daily. 540 tablet 1  . cholecalciferol (VITAMIN D) 1000 UNITS tablet Take 1,000 Units by mouth daily.    . Cyanocobalamin (HM SUPER VITAMIN B12 PO) Take 1 tablet by mouth daily.     Zachary Thornton Calcium (STOOL SOFTENER PO) Take 2 tablets by mouth daily.    [name] escitalopram (LEXAPRO) 20 MG tablet Take 20 mg by mouth daily.    [name] lamoTRIgine (LAMICTAL) 100 MG tablet TAKE (2) TABLETS TWICE DAILY. 120 tablet 5  . Multiple Vitamin (MULTIVITAMIN WITH MINERALS) TABS Take 1 tablet by mouth daily.    . pantoprazole (PROTONIX) 40 MG tablet TAKE 1 TABLET TWICE DAILY. 60 tablet 0  . tamsulosin (FLOMAX) 0.4 MG CAPS capsule Take 1 capsule (0.4 mg total) by mouth daily after breakfast. 30 capsule 0   No current facility-administered medications on file prior to visit.     ALLERGIES: Allergies  Allergen Reactions  . Nuedexta [Dextromethorphan-Quinidine] Anaphylaxis    Lips began to swell  . Nsaids Other (See Comments)    Due to GI bleeding    FAMILY HISTORY: Family History  Problem Relation Age of Onset  . Heart disease Father        MVR  . Hypertension Mother   . Stroke Maternal Aunt    SOCIAL HISTORY: Social History   Socioeconomic History  . Marital status: Married    Spouse name: Not on file  . Number of children: 1  . Years of education: Not on file  . Highest education level: Not on file  Occupational History  . Occupation: Disabled  Social Needs  . Financial resource strain: Not on file  . Food insecurity:    Worry: Not on file    Inability: Not on file  . Transportation needs:    Medical: Not on file    Non-medical: Not on file  Tobacco Use  . Smoking status: Former Games developer  . Smokeless  tobacco: Never Used  Substance and Sexual Activity  . Alcohol use: No    Alcohol/week: 0.0 standard drinks  . Drug use: No  . Sexual activity: Yes  Lifestyle  . Physical activity:    Days per week: Not on file    Minutes per session: Not on file  . Stress: Not on file  Relationships  . Social connections:    Talks on phone: Not on file    Gets together: Not on file    Attends religious service: Not on file    Active member of club or organization: Not on file    Attends meetings of clubs or organizations: Not on file    Relationship status: Not on file  . Intimate partner violence:    Fear of current or ex partner: Not on file    Emotionally abused: Not  on file    Physically abused: Not on file    Forced sexual activity: Not on file  Other Topics Concern  . Not on file  Social History Narrative   Lives with his mother.  Normally ambulates without assistance.      REVIEW OF SYSTEMS: Constitutional: No fevers, chills, or sweats, no generalized fatigue, change in appetite Eyes: No visual changes, double vision, eye pain Ear, nose and throat: No hearing loss, ear pain, nasal congestion, sore throat Cardiovascular: No chest pain, palpitations Respiratory:  No shortness of breath at rest or with exertion, wheezes GastrointestinaI: No nausea, vomiting, diarrhea, abdominal pain, fecal incontinence Genitourinary:  No dysuria, urinary retention or frequency Musculoskeletal:  No neck pain, back pain Integumentary: No rash, pruritus, skin lesions Neurological: as above Psychiatric: No depression, insomnia, anxiety Endocrine: No palpitations, fatigue, diaphoresis, mood swings, change in appetite, change in weight, increased thirst Hematologic/Lymphatic:  No purpura, petechiae. Allergic/Immunologic: no itchy/runny eyes, nasal congestion, recent allergic reactions, rashes  PHYSICAL EXAM: Blood pressure 124/78, pulse 98, height 5\' 9"  (1.753 m), weight 158 lb (71.7 kg), SpO2 97  %. General: No acute distress.  Patient appears well-groomed.  Head:  Normocephalic/atraumatic Eyes:  Fundi examined but not visualized Neck: supple, no paraspinal tenderness, full range of motion Heart:  Regular rate and rhythm Lungs:  Clear to auscultation bilaterally Back: No paraspinal tenderness Neurological Exam: Alert and oriented to person and place.  Attention span and concentration intact, recent and remote memory intact, fund of knowledge intact.  Speech fluent and not dysarthric, language intact.  End gaze horizontal nystagmus, saccadic eye movements with tracking.  Otherwise, CN II-XII intact.  Bulk and tone normal.  Muscle strength 5/5 throughout.  Sensation to light touch intact.  Deep tendon reflexes 2+ throughout, toes downgoing.  Finger-to-nose testing intact.  Left hemiplegic gait with reduced left arm swing.  Romberg with mild sway.  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  Carbamazepine XR 300 mg in the morning and 600 mg at night. 2.  Lamotrigine 200 mg twice daily 3.  Follow-up in 6 months  Shon MilletAdam Jaffe, DO  CC: Rodrigo RanMark Perini, MD

## 2019-02-09 ENCOUNTER — Ambulatory Visit (INDEPENDENT_AMBULATORY_CARE_PROVIDER_SITE_OTHER): Payer: PPO | Admitting: Neurology

## 2019-02-09 ENCOUNTER — Encounter: Payer: Self-pay | Admitting: Neurology

## 2019-02-09 VITALS — BP 124/78 | HR 98 | Ht 69.0 in | Wt 158.0 lb

## 2019-02-09 DIAGNOSIS — G40109 Localization-related (focal) (partial) symptomatic epilepsy and epileptic syndromes with simple partial seizures, not intractable, without status epilepticus: Secondary | ICD-10-CM | POA: Diagnosis not present

## 2019-02-09 DIAGNOSIS — S069X5D Unspecified intracranial injury with loss of consciousness greater than 24 hours with return to pre-existing conscious level, subsequent encounter: Secondary | ICD-10-CM | POA: Diagnosis not present

## 2019-02-09 NOTE — Patient Instructions (Signed)
1.  Continue carbamazepine XR 300mg  in morning and 600mg  at night and lamotrigine 200mg  twice daily  2. Avoid activities in which a seizure would cause danger to yourself or to others.  Don't operate dangerous machinery, swim alone, or climb in high or dangerous places, such as on ladders, roofs, or girders.  Do not drive unless your doctor says you may.  3. If you have any warning that you may have a seizure, lay down in a safe place where you can't hurt yourself.    4.  Contact your doctor if you have any problems that may be related to the medicine you are taking.  5.  Call 911 and bring the patient back to the ED if:        A.  The seizure lasts longer than 5 minutes.       B.  The patient doesn't awaken shortly after the seizure  C.  The patient has new problems such as difficulty seeing, speaking or moving  D.  The patient was injured during the seizure  E.  The patient has a temperature over 102 F (39C)  F.  The patient vomited and now is having trouble breathing  6.  Follow up in 6 months

## 2019-02-22 DIAGNOSIS — Z125 Encounter for screening for malignant neoplasm of prostate: Secondary | ICD-10-CM | POA: Diagnosis not present

## 2019-02-22 DIAGNOSIS — R569 Unspecified convulsions: Secondary | ICD-10-CM | POA: Diagnosis not present

## 2019-02-22 DIAGNOSIS — D6489 Other specified anemias: Secondary | ICD-10-CM | POA: Diagnosis not present

## 2019-02-22 DIAGNOSIS — R82998 Other abnormal findings in urine: Secondary | ICD-10-CM | POA: Diagnosis not present

## 2019-02-22 DIAGNOSIS — E559 Vitamin D deficiency, unspecified: Secondary | ICD-10-CM | POA: Diagnosis not present

## 2019-02-22 DIAGNOSIS — E7849 Other hyperlipidemia: Secondary | ICD-10-CM | POA: Diagnosis not present

## 2019-03-01 DIAGNOSIS — J309 Allergic rhinitis, unspecified: Secondary | ICD-10-CM | POA: Diagnosis not present

## 2019-03-01 DIAGNOSIS — E7849 Other hyperlipidemia: Secondary | ICD-10-CM | POA: Diagnosis not present

## 2019-03-01 DIAGNOSIS — R569 Unspecified convulsions: Secondary | ICD-10-CM | POA: Diagnosis not present

## 2019-03-01 DIAGNOSIS — Z0000 Encounter for general adult medical examination without abnormal findings: Secondary | ICD-10-CM | POA: Diagnosis not present

## 2019-03-01 DIAGNOSIS — K279 Peptic ulcer, site unspecified, unspecified as acute or chronic, without hemorrhage or perforation: Secondary | ICD-10-CM | POA: Diagnosis not present

## 2019-03-01 DIAGNOSIS — R945 Abnormal results of liver function studies: Secondary | ICD-10-CM | POA: Diagnosis not present

## 2019-03-01 DIAGNOSIS — Z1331 Encounter for screening for depression: Secondary | ICD-10-CM | POA: Diagnosis not present

## 2019-03-01 DIAGNOSIS — R338 Other retention of urine: Secondary | ICD-10-CM | POA: Diagnosis not present

## 2019-03-01 DIAGNOSIS — B356 Tinea cruris: Secondary | ICD-10-CM | POA: Diagnosis not present

## 2019-03-01 DIAGNOSIS — Z982 Presence of cerebrospinal fluid drainage device: Secondary | ICD-10-CM | POA: Diagnosis not present

## 2019-03-01 DIAGNOSIS — R634 Abnormal weight loss: Secondary | ICD-10-CM | POA: Diagnosis not present

## 2019-03-01 DIAGNOSIS — R2689 Other abnormalities of gait and mobility: Secondary | ICD-10-CM | POA: Diagnosis not present

## 2019-04-23 ENCOUNTER — Other Ambulatory Visit: Payer: Self-pay | Admitting: Neurology

## 2019-05-24 ENCOUNTER — Other Ambulatory Visit: Payer: Self-pay | Admitting: Neurology

## 2019-06-21 ENCOUNTER — Other Ambulatory Visit: Payer: Self-pay | Admitting: Neurology

## 2019-08-10 NOTE — Progress Notes (Addendum)
Virtual Visit via Video Note The purpose of this virtual visit is to provide medical care while limiting exposure to the novel coronavirus.    Consent was obtained for video visit:  Yes.   Answered questions that patient had about telehealth interaction:  Yes.   I discussed the limitations, risks, security and privacy concerns of performing an evaluation and management service by telemedicine. I also discussed with the patient that there may be a patient responsible charge related to this service. The patient expressed understanding and agreed to proceed.  Pt location: Home Physician Location: office Name of referring provider:  Rodrigo RanPerini, Mark, MD I connected with Zachary Thornton at patients initiation/request on 08/12/2019 at  1:30 PM EDT by video enabled telemedicine application and verified that I am speaking with the correct person using two identifiers. Pt MRN:  [id number] Pt DOB:  08/05/1966 Video Participants:  Zachary BeckersScott K Thornton;  His aid/friend   History of Present Illness:  Zachary LoosenScott Thornton is a 53 year old man with traumatic brain injury and history of GI bleed who follows up for seizure disorder.  He is accompanied by a family friend who supplements history.  UPDATE: Current medications: Lamotrigine 200 mg twice daily, carbamazepine XR 300 mg in a.m. and 600 mg in p.m(600mg  twice daily caused hyponatremia).  BMP from October showed sodium level of 131.  Repeat level on 01/27/2019 was 136.  No seizures since last visit.  He has been feeling well.   HISTORY: He had a traumatic brain injury in 2012 after falling and hitting the back of his head on concrete. He fractured his skull and sustained intracranial bleeding, requiring right craniectomy and VP shunt. [name]. He was in a coma for 6 weeks. After 2 month hospitalization, he was discharged to inpatient rehab facility where he had to relearn how to walk, talk and feed himself. His wife was unable to care for him and he subsequently moved in  with his mother, who is his guardian. [place]. He was hospitalized in December for GI bleed due to duodenal ulcer, requiring multiple blood transfusions.  He presented to the ED on 04/29/16 following an unwitnessed fall in which he hit his head. He did not recall the event. His mother heard him fall in the bathroom and found him on the floor with eyes rolled back, unresponsive with flexed posturing and shaking of both upper extremities, lasting about 2 minutes. He had bowel and bladder incontinence. He was slightly confused afterwards for about 10 minutes. He did not exhibit foaming at the mouth or tongue biting. CT of head was personally reviewed and showed chronic changes of traumatic brain injury with bifrontal and temporal encephalomalacia, as well as post surgical changes of right craniectomy with cranioplasty and left frontal approach VP shunt. No evidence of hydrocephalus seen. He was started on Keppra. He has not had any recurrent spells. He was found to have worsening anemia and received further transfusions by his PCP. He does not drive.   [facility] from 03/[facility]/17 to 03/24/17 for syncope. He had a brief syncopal episode at church. His face became pale. He did not exhibit seizure-like activity. It was brief. He was found to be hypotensive by EMS with a BP of 60/30 and was given IV fluids, which improved blood pressure. He was not orthostatic in the hospital. Work up in the hospital included head CT with no acute findings, EEG with right hemispheric slowing (which  could be due to underlying physiologic abnormality from encephalomalacia and surgery), but no epileptiform discharges, and unremarkable EKG and troponins. My suspicion is that this was not a seizure.  He had a seizure on 12/10/17, described as staring spell of unresponsiveness followed by generalized tonic-clonic activity with urinary incontinence. ED  note mentions he was not taking Keppra, but his caregiver disputes this. Carbamazepine level was 12.6. Na was 131, K 4 and glucose 118. UA was negative for infection. He was loaded with Keppra and discharged on Keppra 1000mg  twice daily. Since then, he has had increased irritability and verbally abusive  Past medication: Keppra (irritability)  He has no history of seizures prior to 2017.  Past Medical History: Past Medical History:  Diagnosis Date  . Back injury   . Blood loss anemia   . Dementia with behavioral disturbance ([place])   . Duodenal ulcer hemorrhage   . GI bleed   . Seizures ([place])   . TBI (traumatic brain injury) ([place])     Medications: Outpatient Encounter Medications as of 08/12/2019  Medication Sig  . atorvastatin (LIPITOR) 20 MG tablet Take 20 mg by mouth daily.  . carbamazepine (CARBATROL) 300 MG 12 hr capsule TAKE 1 CAPSULE IN THE MORNING AND 2 CAPSULES IN THE EVENING.  . carbamazepine (TEGRETOL-XR) 200 MG 12 hr tablet Take 3 tablets (600 mg total) by mouth 2 (two) times daily.  . cholecalciferol (VITAMIN D) 1000 UNITS tablet Take 1,000 Units by mouth daily.  . Cyanocobalamin (HM SUPER VITAMIN B12 PO) Take 1 tablet by mouth daily.   Mariane Baumgarten Calcium (STOOL SOFTENER PO) Take 2 tablets by mouth daily.  [name] escitalopram (LEXAPRO) 20 MG tablet Take 20 mg by mouth daily.  [name] lamoTRIgine (LAMICTAL) 100 MG tablet TAKE (2) TABLETS TWICE DAILY.  . Multiple Vitamin (MULTIVITAMIN WITH MINERALS) TABS Take 1 tablet by mouth daily.  . pantoprazole (PROTONIX) 40 MG tablet TAKE 1 TABLET TWICE DAILY.  . tamsulosin (FLOMAX) 0.4 MG CAPS capsule Take 1 capsule (0.4 mg total) by mouth daily after breakfast.   No facility-administered encounter medications on file as of 08/12/2019.     Allergies: Allergies  Allergen Reactions  . Nuedexta [Dextromethorphan-Quinidine] Anaphylaxis    Lips began to swell  . Nsaids Other (See Comments)    Due to GI bleeding    Family History:  Family History  Problem Relation Age of Onset  . Heart disease Father        MVR  . Hypertension Mother   . Stroke Maternal Aunt     Social History: Social History   Socioeconomic History  . Marital status: Married    Spouse name: Not on file  . Number of children: 1  . Years of education: Not on file  . Highest education level: Not on file  Occupational History  . Occupation: Disabled  Social Needs  . Financial resource strain: Not on file  . Food insecurity    Worry: Not on file    Inability: Not on file  . Transportation needs    Medical: Not on file    Non-medical: Not on file  Tobacco Use  . Smoking status: Former Research scientist (life sciences)  . Smokeless tobacco: Never Used  Substance and Sexual Activity  . Alcohol use: No    Alcohol/week: 0.0 standard drinks  . Drug use: No  . Sexual activity: Yes  Lifestyle  . Physical activity    Days per week: Not on file    Minutes per session: Not on file  .  Stress: Not on file  Relationships  . Social Musicianconnections    Talks on phone: Not on file    Gets together: Not on file    Attends religious service: Not on file    Active member of club or organization: Not on file    Attends meetings of clubs or organizations: Not on file    Relationship status: Not on file  . Intimate partner violence    Fear of current or ex partner: Not on file    Emotionally abused: Not on file    Physically abused: Not on file    Forced sexual activity: Not on file  Other Topics Concern  . Not on file  Social History Narrative   Lives with his mother.  Normally ambulates without assistance.      Observations/Objective:   There were no vitals taken for this visit. No acute distress.  Alert and oriented.  Speech fluent and not dysarthric.  Language intact.    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  Carbamazepine XR 300 mg in the morning and 600 mg at night. 2.  Lamotrigine  200 mg twice daily 3.  Follow-up in 6 months.  Repeat CBC with diff and CMP prior to follow up.  Follow Up Instructions:    -I discussed the assessment and treatment plan with the patient. The patient was provided an opportunity to ask questions and all were answered. The patient agreed with the plan and demonstrated an understanding of the instructions.   The patient was advised to call back or seek an in-person evaluation if the symptoms worsen or if the condition fails to improve as anticipated.    Total Time spent in visit with the patient was:  12 minutes   Cira ServantAdam Robert Natasja Niday, DO

## 2019-08-12 ENCOUNTER — Encounter: Payer: Self-pay | Admitting: Neurology

## 2019-08-12 ENCOUNTER — Other Ambulatory Visit: Payer: Self-pay

## 2019-08-12 ENCOUNTER — Telehealth (INDEPENDENT_AMBULATORY_CARE_PROVIDER_SITE_OTHER): Payer: PPO | Admitting: Neurology

## 2019-08-12 DIAGNOSIS — G40109 Localization-related (focal) (partial) symptomatic epilepsy and epileptic syndromes with simple partial seizures, not intractable, without status epilepticus: Secondary | ICD-10-CM | POA: Diagnosis not present

## 2019-08-12 DIAGNOSIS — S069X5D Unspecified intracranial injury with loss of consciousness greater than 24 hours with return to pre-existing conscious level, subsequent encounter: Secondary | ICD-10-CM

## 2019-09-01 DIAGNOSIS — R2689 Other abnormalities of gait and mobility: Secondary | ICD-10-CM | POA: Diagnosis not present

## 2019-09-01 DIAGNOSIS — F419 Anxiety disorder, unspecified: Secondary | ICD-10-CM | POA: Diagnosis not present

## 2019-09-01 DIAGNOSIS — R21 Rash and other nonspecific skin eruption: Secondary | ICD-10-CM | POA: Diagnosis not present

## 2019-09-01 DIAGNOSIS — R945 Abnormal results of liver function studies: Secondary | ICD-10-CM | POA: Diagnosis not present

## 2019-09-01 DIAGNOSIS — R569 Unspecified convulsions: Secondary | ICD-10-CM | POA: Diagnosis not present

## 2019-09-01 DIAGNOSIS — S069X0S Unspecified intracranial injury without loss of consciousness, sequela: Secondary | ICD-10-CM | POA: Diagnosis not present

## 2019-09-01 DIAGNOSIS — R634 Abnormal weight loss: Secondary | ICD-10-CM | POA: Diagnosis not present

## 2019-09-01 DIAGNOSIS — B356 Tinea cruris: Secondary | ICD-10-CM | POA: Diagnosis not present

## 2019-09-01 DIAGNOSIS — Z982 Presence of cerebrospinal fluid drainage device: Secondary | ICD-10-CM | POA: Diagnosis not present

## 2019-09-02 DIAGNOSIS — R5383 Other fatigue: Secondary | ICD-10-CM | POA: Diagnosis not present

## 2019-09-03 DIAGNOSIS — L239 Allergic contact dermatitis, unspecified cause: Secondary | ICD-10-CM | POA: Diagnosis not present

## 2019-09-25 DIAGNOSIS — Z23 Encounter for immunization: Secondary | ICD-10-CM | POA: Diagnosis not present

## 2019-10-20 ENCOUNTER — Other Ambulatory Visit: Payer: Self-pay | Admitting: Neurology

## 2019-10-20 NOTE — Telephone Encounter (Signed)
Requested Prescriptions   Pending Prescriptions Disp Refills  . lamoTRIgine (LAMICTAL) 100 MG tablet [Pharmacy Med Name: LAMOTRIGINE 100 MG TABLET] 120 tablet 0    Sig: TAKE (2) TABLETS TWICE DAILY.   Rx last filled:06/22/19 #180 3 refills  Pt last seen: 08/12/19   Follow up appt scheduled: 02/14/2020

## 2019-11-17 ENCOUNTER — Other Ambulatory Visit: Payer: Self-pay | Admitting: Neurology

## 2019-11-17 NOTE — Telephone Encounter (Signed)
Requested Prescriptions   Pending Prescriptions Disp Refills  . lamoTRIgine (LAMICTAL) 100 MG tablet [Pharmacy Med Name: LAMOTRIGINE 100 MG TABLET] 120 tablet 0    Sig: TAKE (2) TABLETS TWICE DAILY.   Rx last filled: 10/20/19 #120 0 refills  Pt last seen: 08/12/19   Follow up appt scheduled: 02/14/2020

## 2020-02-09 ENCOUNTER — Encounter: Payer: Self-pay | Admitting: Neurology

## 2020-02-10 NOTE — Progress Notes (Signed)
Virtual Visit via Video Note The purpose of this virtual visit is to provide medical care while limiting exposure to the novel coronavirus.    Consent was obtained for video visit:  Yes.   Answered questions that patient had about telehealth interaction:  Yes.   I discussed the limitations, risks, security and privacy concerns of performing an evaluation and management service by telemedicine. I also discussed with the patient that there may be a patient responsible charge related to this service. The patient expressed understanding and agreed to proceed.  Pt location: Home Physician Location: office Name of referring provider:  Crist Infante, MD I connected with Zachary Thornton at patients initiation/request on 02/14/2020 at  2:30 PM EST by video enabled telemedicine application and verified that I am speaking with the correct person using two identifiers. Pt MRN:  [id number] Pt DOB:  Feb 22, 1966 Video Participants:  Zachary Thornton;  His aid/friend   History of Present Illness:  Zachary Thornton is a 54 year old man with traumatic brain injury and history of GI bleed who follows up for seizure disorder. He is accompanied by a family friend who supplements history.  UPDATE: Current medications: Lamotrigine 200 mg twice daily, carbamazepine XR 300 mg in a.m. and 600 mg in p.m(600mg  twice daily caused hyponatremia).   No seizures or falls since last visit.  He tends to get a little tired later in the day, however he hasn't been [facility] as [facility]     HISTORY: He had a traumatic brain injury in 2012 after falling and hitting the back of his head on concrete. He fractured his skull and sustained intracranial bleeding, requiring right craniectomy and VP shunt. [name] He was in a coma for 6 weeks. After 2 month hospitalization, he was discharged to inpatient rehab facility where he had to relearn how to walk, talk and feed himself. His wife was unable to care for him and  he subsequently moved in with his mother, who is his guardian. His wife and son live in [place]. He was hospitalized in December for GI bleed due to duodenal ulcer, requiring multiple blood transfusions.  He presented to the ED on 04/29/16 following an unwitnessed fall in which he hit his head. He did not recall the event. His mother heard him fall in the bathroom and found him on the floor with eyes rolled back, unresponsive with flexed posturing and shaking of both upper extremities, lasting about 2 minutes. He had bowel and bladder incontinence. He was slightly confused afterwards for about 10 minutes. He did not exhibit foaming at the mouth or tongue biting. CT of head was personally reviewed and showed chronic changes of traumatic brain injury with bifrontal and temporal encephalomalacia, as well as post surgical changes of right craniectomy with cranioplasty and left frontal approach VP shunt. No evidence of hydrocephalus seen. He was started on Keppra. He has not had any recurrent spells. He was found to have worsening anemia and received further transfusions by his PCP. He does not drive.   He was admitted to [facility] from 03/23/17 to 03/24/17 for syncope. He had a brief syncopal episode at church. His face became pale. He did not exhibit seizure-like activity. It was brief. He was found to be hypotensive by EMS with a BP of 60/30 and was given IV fluids, which improved blood pressure. He was not orthostatic in the hospital. Work up in the hospital included head CT with no  acute findings, EEG with right hemispheric slowing (which could be due to underlying physiologic abnormality from encephalomalacia and surgery), but no epileptiform discharges, and unremarkable EKG and troponins. My suspicion is that this was not a seizure.  He had a seizure on 12/10/17, described as staring spell of unresponsiveness followed by generalized tonic-clonic activity with  urinary incontinence. ED note mentions he was not taking Keppra, but his caregiver disputes this. Carbamazepine level was 12.6. Na was 131, K 4 and glucose 118. UA was negative for infection. He was loaded with Keppra and discharged on Keppra 1000mg  twice daily. Since then, he has had increased irritability and verbally abusive  Past medication: Keppra (irritability)  He has no history of seizures prior to 2017.  Past Medical History: Past Medical History:  Diagnosis Date  . Back injury   . Blood loss anemia   . Dementia with behavioral disturbance (HCC)   . Duodenal ulcer hemorrhage   . GI bleed   . Seizures (HCC)   . TBI (traumatic brain injury) (HCC)     Medications: Outpatient Encounter Medications as of 02/14/2020  Medication Sig  . atorvastatin (LIPITOR) 20 MG tablet Take 20 mg by mouth daily.  . carbamazepine (CARBATROL) 300 MG 12 hr capsule TAKE 1 CAPSULE IN THE MORNING AND 2 CAPSULES IN THE EVENING.  . carbamazepine (TEGRETOL-XR) 200 MG 12 hr tablet Take 3 tablets (600 mg total) by mouth 2 (two) times daily.  . cholecalciferol (VITAMIN D) 1000 UNITS tablet Take 1,000 Units by mouth daily.  . Cyanocobalamin (HM SUPER VITAMIN B12 PO) Take 1 tablet by mouth daily.   02/16/2020 Calcium (STOOL SOFTENER PO) Take 2 tablets by mouth daily.  [name] escitalopram (LEXAPRO) 20 MG tablet Take 20 mg by mouth daily.  [name] lamoTRIgine (LAMICTAL) 100 MG tablet TAKE (2) TABLETS TWICE DAILY.  . Multiple Vitamin (MULTIVITAMIN WITH MINERALS) TABS Take 1 tablet by mouth daily.  . pantoprazole (PROTONIX) 40 MG tablet TAKE 1 TABLET TWICE DAILY.  . tamsulosin (FLOMAX) 0.4 MG CAPS capsule Take 1 capsule (0.4 mg total) by mouth daily after breakfast.   No facility-administered encounter medications on file as of 02/14/2020.    Allergies: Allergies  Allergen Reactions  . Nuedexta [Dextromethorphan-Quinidine] Anaphylaxis    Lips began to swell  . Nsaids Other (See Comments)    Due to GI bleeding     Family History: Family History  Problem Relation Age of Onset  . Heart disease Father        MVR  . Hypertension Mother   . Stroke Maternal Aunt     Social History: Social History   Socioeconomic History  . Marital status: Married    Spouse name: Not on file  . Number of children: 1  . Years of education: Not on file  . Highest education level: Not on file  Occupational History  . Occupation: Disabled  Tobacco Use  . Smoking status: Former 02/16/2020  . Smokeless tobacco: Never Used  Substance and Sexual Activity  . Alcohol use: No    Alcohol/week: 0.0 standard drinks  . Drug use: No  . Sexual activity: Yes  Other Topics Concern  . Not on file  Social History Narrative   Lives with his mother.  Normally ambulates without assistance.  Right handed   One story home   Social Determinants of Health   Financial Resource Strain:   . Difficulty of Paying Living Expenses: Not on file  Food Insecurity:   . Worried About Games developer  of Food in the Last Year: Not on file  . Ran Out of Food in the Last Year: Not on file  Transportation Needs:   . Lack of Transportation (Medical): Not on file  . Lack of Transportation (Non-Medical): Not on file  Physical Activity:   . Days of Exercise per Week: Not on file  . Minutes of Exercise per Session: Not on file  Stress:   . Feeling of Stress : Not on file  Social Connections:   . Frequency of Communication with Friends and Family: Not on file  . Frequency of Social Gatherings with Friends and Family: Not on file  . Attends Religious Services: Not on file  . Active Member of Clubs or Organizations: Not on file  . Attends Banker Meetings: Not on file  . Marital Status: Not on file  Intimate Partner Violence:   . Fear of Current or Ex-Partner: Not on file  . Emotionally Abused: Not on file  . Physically Abused: Not on file  . Sexually Abused: Not on file    Observations/Objective:   BP 139/80, Pulse 86, Height  5\' 8"  (1.727 m), weight 158 lb (71.7 kg). No acute distress.  Alert and oriented.  Speech fluent and not dysarthric.  Language intact.  Eyes orthophoric on primary gaze.  Face symmetric.  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  Carbamazepine XR 300mg  in AM and 600mg  in PM 2.  Lamotrigine 200mg  twice daily 3.  CBC and CMP will be checked at Dr. office.  Will have them faxed to me. 4.  Follow up in one year  Follow Up Instructions:    -I discussed the assessment and treatment plan with the patient. The patient was provided an opportunity to ask questions and all were answered. The patient agreed with the plan and demonstrated an understanding of the instructions.   The patient was advised to call back or seek an in-person evaluation if the symptoms worsen or if the condition fails to improve as anticipated.    , DO

## 2020-02-14 ENCOUNTER — Telehealth (INDEPENDENT_AMBULATORY_CARE_PROVIDER_SITE_OTHER): Payer: PPO | Admitting: Neurology

## 2020-02-14 ENCOUNTER — Other Ambulatory Visit: Payer: Self-pay

## 2020-02-14 ENCOUNTER — Encounter: Payer: Self-pay | Admitting: Neurology

## 2020-02-14 VITALS — Ht 68.0 in | Wt 158.0 lb

## 2020-02-14 DIAGNOSIS — G40109 Localization-related (focal) (partial) symptomatic epilepsy and epileptic syndromes with simple partial seizures, not intractable, without status epilepticus: Secondary | ICD-10-CM

## 2020-02-14 DIAGNOSIS — S069X5D Unspecified intracranial injury with loss of consciousness greater than 24 hours with return to pre-existing conscious level, subsequent encounter: Secondary | ICD-10-CM

## 2020-03-16 DIAGNOSIS — E559 Vitamin D deficiency, unspecified: Secondary | ICD-10-CM | POA: Diagnosis not present

## 2020-03-16 DIAGNOSIS — E7849 Other hyperlipidemia: Secondary | ICD-10-CM | POA: Diagnosis not present

## 2020-03-16 DIAGNOSIS — R569 Unspecified convulsions: Secondary | ICD-10-CM | POA: Diagnosis not present

## 2020-03-16 DIAGNOSIS — Z125 Encounter for screening for malignant neoplasm of prostate: Secondary | ICD-10-CM | POA: Diagnosis not present

## 2020-03-23 DIAGNOSIS — Z1331 Encounter for screening for depression: Secondary | ICD-10-CM | POA: Diagnosis not present

## 2020-03-23 DIAGNOSIS — Z0000 Encounter for general adult medical examination without abnormal findings: Secondary | ICD-10-CM | POA: Diagnosis not present

## 2020-03-23 DIAGNOSIS — J309 Allergic rhinitis, unspecified: Secondary | ICD-10-CM | POA: Diagnosis not present

## 2020-03-23 DIAGNOSIS — E785 Hyperlipidemia, unspecified: Secondary | ICD-10-CM | POA: Diagnosis not present

## 2020-03-23 DIAGNOSIS — D649 Anemia, unspecified: Secondary | ICD-10-CM | POA: Diagnosis not present

## 2020-03-23 DIAGNOSIS — Z982 Presence of cerebrospinal fluid drainage device: Secondary | ICD-10-CM | POA: Diagnosis not present

## 2020-03-23 DIAGNOSIS — K279 Peptic ulcer, site unspecified, unspecified as acute or chronic, without hemorrhage or perforation: Secondary | ICD-10-CM | POA: Diagnosis not present

## 2020-03-23 DIAGNOSIS — R569 Unspecified convulsions: Secondary | ICD-10-CM | POA: Diagnosis not present

## 2020-03-23 DIAGNOSIS — B356 Tinea cruris: Secondary | ICD-10-CM | POA: Diagnosis not present

## 2020-03-23 DIAGNOSIS — R945 Abnormal results of liver function studies: Secondary | ICD-10-CM | POA: Diagnosis not present

## 2020-03-23 DIAGNOSIS — R634 Abnormal weight loss: Secondary | ICD-10-CM | POA: Diagnosis not present

## 2020-03-23 DIAGNOSIS — R2689 Other abnormalities of gait and mobility: Secondary | ICD-10-CM | POA: Diagnosis not present

## 2020-03-28 ENCOUNTER — Telehealth: Payer: Self-pay | Admitting: Neurology

## 2020-03-28 NOTE — Telephone Encounter (Signed)
Noted  

## 2020-03-28 NOTE — Telephone Encounter (Signed)
Call transferred from Access Nurse:  Patient's house mate called to report a fall today or last night. He has a slight bruise on his head. Patient is eating and talking fine. They want Dr. Everlena Cooper to be aware because of the patient's history.

## 2020-03-28 NOTE — Telephone Encounter (Signed)
Pt c/o: fall New?  Yes.   Hit head? Yes.   Mental status change or new/increased confusion after/since fall? No. - if so, advise go to ER Last time patient did PT for balance?  Unsure. Patient says he fell while he was combing his hair but does not remember if it was last night or this morning.  Just FYI.

## 2020-05-19 ENCOUNTER — Other Ambulatory Visit: Payer: Self-pay | Admitting: Neurology

## 2020-06-16 ENCOUNTER — Other Ambulatory Visit: Payer: Self-pay | Admitting: Neurology

## 2020-07-17 ENCOUNTER — Other Ambulatory Visit: Payer: Self-pay | Admitting: Neurology

## 2020-08-16 ENCOUNTER — Other Ambulatory Visit: Payer: Self-pay | Admitting: Neurology

## 2020-09-16 ENCOUNTER — Other Ambulatory Visit: Payer: Self-pay | Admitting: Neurology

## 2020-09-19 DIAGNOSIS — E785 Hyperlipidemia, unspecified: Secondary | ICD-10-CM | POA: Diagnosis not present

## 2020-09-19 DIAGNOSIS — J309 Allergic rhinitis, unspecified: Secondary | ICD-10-CM | POA: Diagnosis not present

## 2020-09-19 DIAGNOSIS — K279 Peptic ulcer, site unspecified, unspecified as acute or chronic, without hemorrhage or perforation: Secondary | ICD-10-CM | POA: Diagnosis not present

## 2020-09-19 DIAGNOSIS — F419 Anxiety disorder, unspecified: Secondary | ICD-10-CM | POA: Diagnosis not present

## 2020-09-19 DIAGNOSIS — B356 Tinea cruris: Secondary | ICD-10-CM | POA: Diagnosis not present

## 2020-09-19 DIAGNOSIS — R569 Unspecified convulsions: Secondary | ICD-10-CM | POA: Diagnosis not present

## 2020-09-19 DIAGNOSIS — Z23 Encounter for immunization: Secondary | ICD-10-CM | POA: Diagnosis not present

## 2020-09-19 DIAGNOSIS — R2689 Other abnormalities of gait and mobility: Secondary | ICD-10-CM | POA: Diagnosis not present

## 2020-09-19 DIAGNOSIS — D649 Anemia, unspecified: Secondary | ICD-10-CM | POA: Diagnosis not present

## 2020-09-19 DIAGNOSIS — R040 Epistaxis: Secondary | ICD-10-CM | POA: Diagnosis not present

## 2020-09-19 DIAGNOSIS — S069X0S Unspecified intracranial injury without loss of consciousness, sequela: Secondary | ICD-10-CM | POA: Diagnosis not present

## 2020-09-19 DIAGNOSIS — Z982 Presence of cerebrospinal fluid drainage device: Secondary | ICD-10-CM | POA: Diagnosis not present

## 2021-02-07 ENCOUNTER — Other Ambulatory Visit: Payer: Self-pay | Admitting: Neurology

## 2021-02-12 NOTE — Progress Notes (Signed)
NEUROLOGY FOLLOW UP OFFICE NOTE  [facility] [id number]   Subjective:  Zachary Thornton is a 55 year old man with traumatic brain injury and history of GI bleed who follows up for seizuredisorder. He is accompanied by a family friend/caregiver whom he lives with supplements history.  UPDATE: Current medications: Lamotrigine 200 mg twice daily, carbamazepine XR 300 mg in a.m. and 600 mg in p.m(600mg  twice daily caused hyponatremia).  No seizures or falls since last visit[facility].  Since his head trauma, he has had some behavioral changes such as not throwing trash away in the basket.  Over the past 3 weeks, he has exhibited new behavior, such as throwing the trash outside the window and cutting up his underwear.  No agitation.           HISTORY: He had a traumatic brain injury in 2012 after falling and hitting the back of his head on concrete. He fractured his skull and sustained intracranial bleeding, requiring right craniectomy and VP shunt. [name] He was in a coma for 6 weeks. After 2 month hospitalization, he was discharged to inpatient rehab facility where he had to relearn how to walk, talk and feed himself. His wife was unable to care for him and he subsequently moved in with his mother, who is his guardian. His wife and son live in [place]. He was hospitalized in December for GI bleed due to duodenal ulcer, requiring multiple blood transfusions.  He presented to the ED on 04/29/16 following an unwitnessed fall in which he hit his head. He did not recall the event. His mother heard him fall in the bathroom and found him on the floor with eyes rolled back, unresponsive with flexed posturing and shaking of both upper extremities, lasting about 2 minutes. He had bowel and bladder incontinence. He was slightly confused afterwards for about 10 minutes. He did not exhibit foaming at the mouth or tongue biting. CT of head was personally reviewed and showed  chronic changes of traumatic brain injury with bifrontal and temporal encephalomalacia, as well as post surgical changes of right craniectomy with cranioplasty and left frontal approach VP shunt. No evidence of hydrocephalus seen. He was started on Keppra. He has not had any recurrent spells. He was found to have worsening anemia and received further transfusions by his PCP. He does not drive.   He was admitted to [facility] from 03/23/17 to 03/24/17 for syncope. He had a brief syncopal episode at church. His face became pale. He did not exhibit seizure-like activity. It was brief. He was found to be hypotensive by EMS with a BP of 60/30 and was given IV fluids, which improved blood pressure. He was not orthostatic in the hospital. Work up in the hospital included head CT with no acute findings, EEG with right hemispheric slowing (which could be due to underlying physiologic abnormality from encephalomalacia and surgery), but no epileptiform discharges, and unremarkable EKG and troponins. My suspicion is that this was not a seizure.  He had a seizure on 12/10/17, described as staring spell of unresponsiveness followed by generalized tonic-clonic activity with urinary incontinence. ED note mentions he was not taking Keppra, but his caregiver disputes this. Carbamazepine level was 12.6. Na was 131, K 4 and glucose 118. UA was negative for infection. He was loaded with Keppra and discharged on Keppra 1000mg  twice daily. Since then, he has had increased irritability and verbally abusive  Past medication: Keppra (irritability)  He has no history of seizures prior to 2017.  PAST MEDICAL HISTORY: Past Medical History:  Diagnosis Date  . Back injury   . Blood loss anemia   . Dementia with behavioral disturbance (HCC)   . Duodenal ulcer hemorrhage   . GI bleed   . Seizures (HCC)   . TBI (traumatic brain injury) [facility])     MEDICATIONS: Current Outpatient Medications  on File Prior to Visit  Medication Sig Dispense Refill  . atorvastatin (LIPITOR) 20 MG tablet Take 20 mg by mouth daily.    . carbamazepine (CARBATROL) 300 MG 12 hr capsule TAKE 1 CAPSULE IN THE MORNING AND 2 CAPSULES IN THE EVENING. 90 capsule 4  . carbamazepine (TEGRETOL-XR) 200 MG 12 hr tablet Take 3 tablets (600 mg total) by mouth 2 (two) times daily. 540 tablet 1  . cholecalciferol (VITAMIN D) 1000 UNITS tablet Take 1,000 Units by mouth daily.    . Cyanocobalamin (HM SUPER VITAMIN B12 PO) Take 1 tablet by mouth daily.     [name] Calcium (STOOL SOFTENER PO) Take 2 tablets by mouth daily.    [name] escitalopram (LEXAPRO) 20 MG tablet Take 20 mg by mouth daily.    [name] lamoTRIgine (LAMICTAL) 100 MG tablet TAKE (2) TABLETS TWICE DAILY. 120 tablet 5  . Multiple Vitamin (MULTIVITAMIN WITH MINERALS) TABS Take 1 tablet by mouth daily.    . pantoprazole (PROTONIX) 40 MG tablet TAKE 1 TABLET TWICE DAILY. 60 tablet 0  . tamsulosin (FLOMAX) 0.4 MG CAPS capsule Take 1 capsule (0.4 mg total) by mouth daily after breakfast. 30 capsule 0   No current facility-administered medications on file prior to visit.    ALLERGIES: Allergies  Allergen Reactions  . Nuedexta [Dextromethorphan-Quinidine] Anaphylaxis    Lips began to swell  . Nsaids Other (See Comments)    Due to GI bleeding    FAMILY HISTORY: Family History  Problem Relation Age of Onset  . Heart disease Father        MVR  . Hypertension Mother   . Stroke Maternal Aunt     SOCIAL HISTORY: Social History   Socioeconomic History  . Marital status: Married    Spouse name: Not on file  . Number of children: 1  . Years of education: Not on file  . Highest education level: Not on file  Occupational History  . Occupation: Disabled  Tobacco Use  . Smoking status: Former Games developer  . Smokeless tobacco: Never Used  Vaping Use  . Vaping Use: Never used  Substance and Sexual Activity  . Alcohol use: No    Alcohol/week: 0.0 standard drinks   . Drug use: No  . Sexual activity: Yes  Other Topics Concern  . Not on file  Social History Narrative   Lives with his mother.  Normally ambulates without assistance.  Right handed   One story home   Social Determinants of Health   Financial Resource Strain: Not on file  Food Insecurity: Not on file  Transportation Needs: Not on file  Physical Activity: Not on file  Stress: Not on file  Social Connections: Not on file  Intimate Partner Violence: Not on file     Objective:  Blood pressure 124/80, pulse (!) 112, height 5\' 8"  (1.727 m), weight 177 lb (80.3 kg), SpO2 98 %. General: No acute distress.  Patient appears well-groomed.   Head:  Normocephalic/atraumatic Eyes:  Fundi examined but not visualized Neck: supple, no paraspinal tenderness, full range of motion Heart:  Regular rate  and rhythm Lungs:  Clear to auscultation bilaterally Back: No paraspinal tenderness Neurological Exam: alert and oriented to person and place.  Speech fluent and not dysarthric.  Language intact.  End gaze horizontal nystagmus.  Otherwise, CN II-XII intact.  Bulk and tone normal.  Muscle strength 5/5 throughout.  Sensation to light touch intact.  Deep tendon reflexes 2+ throughout, toes downgoing.  Finger to nose testing intact.  Wide-based gait.  Romberg with mild sway.   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  Carbamazepine XR 300mg  in AM and 600mg  in PM 2.  Lamotrigine 200mg  twice daily 3.  CBC and CMP will be checked at Dr. office.  Will have them faxed to me. 4.  Follow up in one year  , DO  CC:  , MD

## 2021-02-13 ENCOUNTER — Encounter: Payer: Self-pay | Admitting: Neurology

## 2021-02-13 ENCOUNTER — Other Ambulatory Visit: Payer: Self-pay

## 2021-02-13 ENCOUNTER — Ambulatory Visit: Payer: PPO | Admitting: Neurology

## 2021-02-13 VITALS — BP 124/80 | HR 112 | Ht 68.0 in | Wt 177.0 lb

## 2021-02-13 DIAGNOSIS — G40109 Localization-related (focal) (partial) symptomatic epilepsy and epileptic syndromes with simple partial seizures, not intractable, without status epilepticus: Secondary | ICD-10-CM | POA: Diagnosis not present

## 2021-02-13 DIAGNOSIS — S069X5D Unspecified intracranial injury with loss of consciousness greater than 24 hours with return to pre-existing conscious level, subsequent encounter: Secondary | ICD-10-CM

## 2021-02-13 NOTE — Patient Instructions (Signed)
No change in medication

## 2021-02-15 ENCOUNTER — Telehealth: Payer: Self-pay | Admitting: Neurology

## 2021-02-15 NOTE — Telephone Encounter (Signed)
Advised Zachary Thornton pt handicap sticker form will be filled out We will call her once signed.

## 2021-02-15 NOTE — Telephone Encounter (Addendum)
Patient's caregiver, Sunny Schlein, stopped by the office and dropped off a parking form to be completed. She is requesting a call back from a nurse to review his medications. Please call for pickup.  Sunny Schlein is not on the patient's DPR but did accompany him to his last visit. New DPR provided today to update with patient.

## 2021-02-26 NOTE — Telephone Encounter (Signed)
I didn't receive any forms

## 2021-02-26 NOTE — Telephone Encounter (Signed)
[facility] Handicap paperwork filled out and at the front desk.

## 2021-03-13 ENCOUNTER — Inpatient Hospital Stay
Admission: EM | Admit: 2021-03-13 | Discharge: 2021-03-16 | DRG: 871 | Disposition: A | Discharge status: Home / Self Care | Payer: PPO | Attending: Internal Medicine | Admitting: Internal Medicine

## 2021-03-13 ENCOUNTER — Emergency Department: Payer: PPO

## 2021-03-13 ENCOUNTER — Other Ambulatory Visit: Payer: Self-pay

## 2021-03-13 DIAGNOSIS — F0391 Unspecified dementia with behavioral disturbance: Secondary | ICD-10-CM | POA: Diagnosis not present

## 2021-03-13 DIAGNOSIS — S020XXA Fracture of vault of skull, initial encounter for closed fracture: Secondary | ICD-10-CM | POA: Diagnosis present

## 2021-03-13 DIAGNOSIS — K429 Umbilical hernia without obstruction or gangrene: Secondary | ICD-10-CM | POA: Diagnosis not present

## 2021-03-13 DIAGNOSIS — G40909 Epilepsy, unspecified, not intractable, without status epilepticus: Secondary | ICD-10-CM | POA: Diagnosis present

## 2021-03-13 DIAGNOSIS — R6520 Severe sepsis without septic shock: Secondary | ICD-10-CM | POA: Diagnosis not present

## 2021-03-13 DIAGNOSIS — Z886 Allergy status to analgesic agent status: Secondary | ICD-10-CM | POA: Diagnosis not present

## 2021-03-13 DIAGNOSIS — J439 Emphysema, unspecified: Secondary | ICD-10-CM | POA: Diagnosis not present

## 2021-03-13 DIAGNOSIS — Z87820 Personal history of traumatic brain injury: Secondary | ICD-10-CM | POA: Diagnosis not present

## 2021-03-13 DIAGNOSIS — A419 Sepsis, unspecified organism: Principal | ICD-10-CM | POA: Diagnosis present

## 2021-03-13 DIAGNOSIS — Z872 Personal history of diseases of the skin and subcutaneous tissue: Secondary | ICD-10-CM | POA: Diagnosis not present

## 2021-03-13 DIAGNOSIS — D509 Iron deficiency anemia, unspecified: Secondary | ICD-10-CM | POA: Diagnosis present

## 2021-03-13 DIAGNOSIS — R9431 Abnormal electrocardiogram [ECG] [EKG]: Secondary | ICD-10-CM | POA: Diagnosis not present

## 2021-03-13 DIAGNOSIS — G9389 Other specified disorders of brain: Secondary | ICD-10-CM | POA: Diagnosis not present

## 2021-03-13 DIAGNOSIS — Z982 Presence of cerebrospinal fluid drainage device: Secondary | ICD-10-CM

## 2021-03-13 DIAGNOSIS — Z8719 Personal history of other diseases of the digestive system: Secondary | ICD-10-CM

## 2021-03-13 DIAGNOSIS — J690 Pneumonitis due to inhalation of food and vomit: Secondary | ICD-10-CM | POA: Diagnosis not present

## 2021-03-13 DIAGNOSIS — Z20822 Contact with and (suspected) exposure to covid-19: Secondary | ICD-10-CM | POA: Diagnosis not present

## 2021-03-13 DIAGNOSIS — Z8711 Personal history of peptic ulcer disease: Secondary | ICD-10-CM | POA: Diagnosis not present

## 2021-03-13 DIAGNOSIS — E871 Hypo-osmolality and hyponatremia: Secondary | ICD-10-CM | POA: Diagnosis present

## 2021-03-13 DIAGNOSIS — R4189 Other symptoms and signs involving cognitive functions and awareness: Secondary | ICD-10-CM | POA: Diagnosis not present

## 2021-03-13 DIAGNOSIS — S069X0S Unspecified intracranial injury without loss of consciousness, sequela: Secondary | ICD-10-CM

## 2021-03-13 DIAGNOSIS — J189 Pneumonia, unspecified organism: Secondary | ICD-10-CM

## 2021-03-13 DIAGNOSIS — Z888 Allergy status to other drugs, medicaments and biological substances status: Secondary | ICD-10-CM | POA: Diagnosis not present

## 2021-03-13 DIAGNOSIS — Z87891 Personal history of nicotine dependence: Secondary | ICD-10-CM

## 2021-03-13 DIAGNOSIS — R6521 Severe sepsis with septic shock: Secondary | ICD-10-CM | POA: Diagnosis present

## 2021-03-13 DIAGNOSIS — R0602 Shortness of breath: Secondary | ICD-10-CM | POA: Diagnosis not present

## 2021-03-13 DIAGNOSIS — F068 Other specified mental disorders due to known physiological condition: Secondary | ICD-10-CM

## 2021-03-13 DIAGNOSIS — J984 Other disorders of lung: Secondary | ICD-10-CM | POA: Diagnosis not present

## 2021-03-13 DIAGNOSIS — R1110 Vomiting, unspecified: Secondary | ICD-10-CM | POA: Diagnosis not present

## 2021-03-13 DIAGNOSIS — Z79899 Other long term (current) drug therapy: Secondary | ICD-10-CM | POA: Diagnosis not present

## 2021-03-13 DIAGNOSIS — K4090 Unilateral inguinal hernia, without obstruction or gangrene, not specified as recurrent: Secondary | ICD-10-CM | POA: Diagnosis not present

## 2021-03-13 DIAGNOSIS — S069XAA Unspecified intracranial injury with loss of consciousness status unknown, initial encounter: Secondary | ICD-10-CM | POA: Diagnosis present

## 2021-03-13 HISTORY — DX: Headache, unspecified: R51.9

## 2021-03-13 LAB — COMPREHENSIVE METABOLIC PANEL
ALT: 21 U/L (ref 0–44)
AST: 27 U/L (ref 15–41)
Albumin: 4.4 g/dL (ref 3.5–5.0)
Alkaline Phosphatase: 92 U/L (ref 38–126)
Anion gap: 12 (ref 5–15)
BUN: 16 mg/dL (ref 6–20)
CO2: 23 mmol/L (ref 22–32)
Calcium: 9 mg/dL (ref 8.9–10.3)
Chloride: 92 mmol/L — ABNORMAL LOW (ref 98–111)
Creatinine, Ser: 0.98 mg/dL (ref 0.61–1.24)
GFR, Estimated: >60 mL/min (ref >60)
Glucose, Bld: 158 mg/dL — ABNORMAL HIGH (ref 70–99)
Potassium: 4 mmol/L (ref 3.5–5.1)
Sodium: 127 mmol/L — ABNORMAL LOW (ref 135–145)
Total Bilirubin: 0.8 mg/dL (ref 0.3–1.2)
Total Protein: 7.4 g/dL (ref 6.5–8.1)

## 2021-03-13 LAB — CBC
HCT: 42.3 % (ref 39.0–52.0)
Hemoglobin: 14.6 g/dL (ref 13.0–17.0)
MCH: 32 pg (ref 26.0–34.0)
MCHC: 34.5 g/dL (ref 30.0–36.0)
MCV: 92.8 fL (ref 80.0–100.0)
Platelets: 221 10*3/uL (ref 150–400)
RBC: 4.56 MIL/uL (ref 4.22–5.81)
RDW: 11.9 % (ref 11.5–15.5)
WBC: 10.4 10*3/uL (ref 4.0–10.5)
nRBC: 0 % (ref 0.0–0.2)

## 2021-03-13 LAB — LIPASE, BLOOD: Lipase: 28 U/L (ref 11–51)

## 2021-03-13 MED ORDER — LACTATED RINGERS IV BOLUS (SEPSIS)
500.0000 mL | Freq: Once | INTRAVENOUS | Status: AC
  Administered 2021-03-14: 500 mL via INTRAVENOUS

## 2021-03-13 MED ORDER — LACTATED RINGERS IV SOLN: INTRAVENOUS | Status: DC

## 2021-03-13 MED ORDER — ACETAMINOPHEN 500 MG PO TABS
1000.0000 mg | ORAL_TABLET | Freq: Once | ORAL | Status: AC
  Administered 2021-03-14: 1000 mg via ORAL
  Filled 2021-03-13: qty 2

## 2021-03-13 MED ORDER — VANCOMYCIN HCL IN DEXTROSE 1-5 GM/200ML-% IV SOLN: 1000.0000 mg | Freq: Once | INTRAVENOUS | Status: DC

## 2021-03-13 MED ORDER — METRONIDAZOLE IN NACL 5-0.79 MG/ML-% IV SOLN
500.0000 mg | Freq: Once | INTRAVENOUS | Status: AC
  Administered 2021-03-14: 500 mg via INTRAVENOUS
  Filled 2021-03-13: qty 100

## 2021-03-13 MED ORDER — LAMOTRIGINE 100 MG PO TABS
200.0000 mg | ORAL_TABLET | Freq: Once | ORAL | Status: AC
  Administered 2021-03-14: 200 mg via ORAL
  Filled 2021-03-13: qty 2

## 2021-03-13 MED ORDER — ONDANSETRON HCL 4 MG/2ML IJ SOLN
4.0000 mg | Freq: Once | INTRAMUSCULAR | Status: AC
  Administered 2021-03-14: 4 mg via INTRAVENOUS
  Filled 2021-03-13: qty 2

## 2021-03-13 MED ORDER — VANCOMYCIN HCL 1750 MG/350ML IV SOLN
1750.0000 mg | Freq: Once | INTRAVENOUS | Status: AC
  Administered 2021-03-14: 1750 mg via INTRAVENOUS
  Filled 2021-03-13: qty 350

## 2021-03-13 MED ORDER — LACTATED RINGERS IV BOLUS (SEPSIS)
1000.0000 mL | Freq: Once | INTRAVENOUS | Status: AC
  Administered 2021-03-13: 1000 mL via INTRAVENOUS

## 2021-03-13 MED ORDER — CARBAMAZEPINE 200 MG PO TABS
600.0000 mg | ORAL_TABLET | Freq: Once | ORAL | Status: AC
  Administered 2021-03-14: 600 mg via ORAL
  Filled 2021-03-13: qty 3

## 2021-03-13 MED ORDER — SODIUM CHLORIDE 0.9 % IV SOLN
2.0000 g | Freq: Once | INTRAVENOUS | Status: AC
  Administered 2021-03-14: 2 g via INTRAVENOUS
  Filled 2021-03-13: qty 2

## 2021-03-13 MED ORDER — LACTATED RINGERS IV BOLUS (SEPSIS)
1000.0000 mL | Freq: Once | INTRAVENOUS | Status: AC
  Administered 2021-03-14: 1000 mL via INTRAVENOUS

## 2021-03-13 MED ORDER — ONDANSETRON 4 MG PO TBDP
4.0000 mg | ORAL_TABLET | Freq: Once | ORAL | Status: AC | PRN
  Administered 2021-03-13: 4 mg via ORAL
  Filled 2021-03-13: qty 1

## 2021-03-13 NOTE — ED Provider Notes (Signed)
[facility] Emergency Department Provider Note  ____________________________________________   Event Date/Time   First MD Initiated Contact with Patient 03/13/21 2331     (approximate)  I have reviewed the triage vital signs and the nursing notes.   HISTORY  Chief Complaint Emesis    HPI Zachary Thornton is a 55 y.o. male with history of TBI in 2012 from a fall, seizures on Tegretol and Lamictal, duodenal ulcer with previous history of GI bleed who presents to the emergency department with his caregiver.  She states today they were out at lunch and he did not appear to be acting right.  She states that his face appeared very flushed.  She asked if he was having any pain and he denied it.  She states it is common for him denies having pain.  States when they got out of the car he vomited once.  She took his temperature at home and it was normal.  She states his blood pressure was 127/60 but his oxygen saturation was 90% at 7:30 PM.  She brought him to the emergency department at that time for further evaluation.  No known cough, diarrhea.  States she is not sure if he has had bloody stools or melena as nobody checks his stool.  He lives with his caregiver who is at the bedside, Zachary Thornton.  She reports while in the ED today he has had 2 small seizures where he will get "tense" and then leaning to one side seeming to lose consciousness and will snore for a couple of minutes and then returned to baseline.  She states that this is typical of his seizures but he has not had a seizure in 2 to 3 years.  He has not had his nighttime Lamictal or Tegretol but has not missed any other medications.  She states Tegretol has caused him to be hyponatremic in the past.  Vaccinated for covid x 3 and influenza.  PCP - Dr. Waynard Edwards  Neuro - Dr. Everlena Cooper    Past Medical History:  Diagnosis Date  . Back injury   . Blood loss anemia   . Dementia with behavioral disturbance (HCC)   .  Duodenal ulcer hemorrhage   . GI bleed   . Seizures (HCC)   . TBI (traumatic brain injury) [facility])     Patient Active Problem List   Diagnosis Date Noted  . Syncope 03/23/2017  . Bleeding duodenal ulcer   . Hypokalemia   . Lactic acidosis   . Duodenal ulcer hemorrhage 10/31/2015  . Urinary retention 10/30/2015  . Acute blood loss anemia 10/29/2015  . Hypotension 10/29/2015  . Syncope and collapse 10/29/2015  . Traumatic brain injury with depressed frontal skull fracture (HCC) 09/07/2012  . Cognitive deficit as late effect of traumatic brain injury (HCC) 09/07/2012  . Episodic dyscontrol syndrome 09/07/2012    Past Surgical History:  Procedure Laterality Date  . CRANIOTOMY     Post TBI craniotomy and plate insertion  . CSF SHUNT    . ESOPHAGOGASTRODUODENOSCOPY N/A 11/01/2015   Procedure: ESOPHAGOGASTRODUODENOSCOPY (EGD);  Surgeon: Ruffin Frederick, MD;  Location: [facility];  Service: Gastroenterology;  Laterality: N/A;  . ESOPHAGOGASTRODUODENOSCOPY (EGD) WITH PROPOFOL N/A 10/30/2015   Procedure: ESOPHAGOGASTRODUODENOSCOPY (EGD) WITH PROPOFOL;  Surgeon: Rachael Fee, MD;  Location: [facility] END[facility]COPY;  Service: Endoscopy;  Laterality: N/A;    Prior to Admission medications   Medication Sig Start Date End Date Taking? Authorizing Provider  atorvastatin (LIPITOR) 20 MG tablet Take  20 mg by mouth daily.    [provider]  carbamazepine (CARBATROL) 300 MG 12 hr capsule TAKE 1 CAPSULE IN THE MORNING AND 2 CAPSULES IN THE EVENING. 02/07/21   Everlena Cooper, Adam R, DO  carbamazepine (TEGRETOL-XR) 200 MG 12 hr tablet Take 3 tablets (600 mg total) by mouth 2 (two) times daily. 06/05/18   Drema Dallas, DO  cholecalciferol (VITAMIN D) 1000 UNITS tablet Take 1,000 Units by mouth daily.    [provider]  Cyanocobalamin (HM SUPER VITAMIN B12 PO) Take 1 tablet by mouth daily.     [provider]  Docusate Calcium (STOOL SOFTENER PO) Take 2 tablets by mouth daily.     [provider]  escitalopram (LEXAPRO) 20 MG tablet Take 20 mg by mouth daily.    [provider]  lamoTRIgine (LAMICTAL) 100 MG tablet TAKE (2) TABLETS TWICE DAILY. 02/07/21   Drema Dallas, DO  Multiple Vitamin (MULTIVITAMIN WITH MINERALS) TABS Take 1 tablet by mouth daily.    [provider]  pantoprazole (PROTONIX) 40 MG tablet TAKE 1 TABLET TWICE DAILY. 12/17/16   Rachael Fee, MD  tamsulosin [employer]) 0.4 MG CAPS capsule Take 1 capsule (0.4 mg total) by mouth daily after breakfast. 11/07/15   Rhetta Mura, MD    Allergies Nuedexta [dextromethorphan-quinidine] and Nsaids  Family History  Problem Relation Age of Onset  . Heart disease Father        MVR  . Hypertension Mother   . Stroke Maternal Aunt     Social History Social History   Tobacco Use  . Smoking status: Former Games developer  . Smokeless tobacco: Never Used  Vaping Use  . Vaping Use: Never used  Substance Use Topics  . Alcohol use: No    Alcohol/week: 0.0 standard drinks  . Drug use: No    Review of Systems Level 5 caveat secondary to TBI  ____________________________________________   PHYSICAL EXAM:  VITAL SIGNS: ED Triage Vitals  Enc Vitals Group     BP 03/13/21 2006 108/75     Pulse Rate 03/13/21 2006 (!) 122     Resp 03/13/21 2006 20     Temp 03/13/21 2006 97.6 F (36.4 C)     Temp Source 03/13/21 2006 Oral     SpO2 03/13/21 2006 93 %     Weight 03/13/21 2009 170 lb (77.1 kg)     Height 03/13/21 2009  (1.727 m)     Head Circumference --      Peak Flow --      Pain Score 03/13/21 2009 0     Pain Loc --      Pain Edu? --      Excl. in GC? --    CONSTITUTIONAL: Alert and answer some questions.  Does not appear to be in distress. HEAD: Normocephalic EYES: Conjunctivae clear, pupils appear equal, EOM appear intact ENT: normal nose; moist mucous membranes NECK: Supple, normal ROM CARD: Regular and tachycardic; S1 and S2 appreciated; no murmurs, no  clicks, no rubs, no gallops RESP: Normal chest excursion without splinting or tachypnea; breath sounds clear and equal bilaterally; no wheezes, no rhonchi, no rales, no hypoxia or respiratory distress, speaking full sentences ABD/GI: Normal bowel sounds; non-distended; soft, non-tender, no rebound, no guarding, no peritoneal signs, no hepatosplenomegaly RECTAL:  Normal rectal tone, no gross blood or melena, guaiac negative, no hemorrhoids appreciated, nontender rectal exam, no fecal impaction. Chaperone present. BACK: The back appears normal EXT: Normal ROM in all  joints; no deformity noted, no edema; no cyanosis SKIN: Normal color for age and race; warm; no rash on exposed skin NEURO: Moves all extremities equally, normal speech, no facial asymmetry PSYCH: The patient's mood and manner are appropriate.  ____________________________________________   LABS (all labs ordered are listed, but only abnormal results are displayed)  Labs Reviewed  COMPREHENSIVE METABOLIC PANEL - Abnormal; Notable for the following components:      Result Value   Sodium 127 (*)    Chloride 92 (*)    Glucose, Bld 158 (*)    All other components within normal limits  URINALYSIS, COMPLETE (UACMP) WITH MICROSCOPIC - Abnormal; Notable for the following components:   Color, Urine YELLOW (*)    APPearance CLEAR (*)    All other components within normal limits  LACTIC ACID, PLASMA - Abnormal; Notable for the following components:   Lactic Acid, Venous 3.1 (*)    All other components within normal limits  LACTIC ACID, PLASMA - Abnormal; Notable for the following components:   Lactic Acid, Venous 2.4 (*)    All other components within normal limits  CBC WITH DIFFERENTIAL/PLATELET - Abnormal; Notable for the following components:   Neutro Abs 9.3 (*)    Lymphs Abs 0.2 (*)    All other components within normal limits  RESP PANEL BY RT-PCR (FLU A&B, COVID) ARPGX2  CULTURE, BLOOD (ROUTINE X 2)  CULTURE, BLOOD  (ROUTINE X 2)  URINE CULTURE  LIPASE, BLOOD  CBC  PROTIME-INR  APTT  TROPONIN I (HIGH SENSITIVITY)   ____________________________________________  EKG   EKG Interpretation  Date/Time:  Tuesday March 13 2021 23:38:46 EDT Ventricular Rate:  98 PR Interval:  204 QRS Duration: 98 QT Interval:  335 QTC Calculation: 428 R Axis:   -38 Text Interpretation: Sinus rhythm Prolonged PR interval Probable left atrial enlargement Left axis deviation Confirmed by Rochele RaringWard, Dajon Rowe [phone]([id number]) on 03/14/2021 12:23:34 AM        EKG Interpretation  Date/Time:  Tuesday March 13 2021 23:38:46 EDT Ventricular Rate:  98 PR Interval:  204 QRS Duration: 98 QT Interval:  335 QTC Calculation: 428 R Axis:   -38 Text Interpretation: Sinus rhythm Prolonged PR interval Probable left atrial enlargement Left axis deviation Confirmed by Rochele RaringWard, Roberts Bon [phone]([id number]) on 03/14/2021 12:23:34 AM        ____________________________________________  RADIOLOGY Normajean BaxterI, Delorean Knutzen, personally viewed and evaluated these images (plain radiographs) as part of my medical decision making, as well as reviewing the written report by the radiologist.  ED MD interpretation:  R sided PNA.  Official radiology report(s): CT Head Wo Contrast  Result Date: 03/14/2021 CLINICAL DATA:  Shunt follow-up EXAM: CT HEAD WITHOUT CONTRAST TECHNIQUE: Contiguous axial images were obtained from the base of the skull through the vertex without intravenous contrast. COMPARISON:  03/23/2017 FINDINGS: Brain: There is a left frontal approach shunt catheter with tip in the frontal horn of the left lateral ventricle. Unchanged appearance of dural hyperdensity underlying the right-sided craniotomy. Bifrontal and bitemporal encephalomalacia and bilateral cerebellar encephalomalacia is unchanged. No acute hemorrhage. Size and configuration of the ventricles are unchanged. Vascular: No hyperdense vessel or unexpected calcification. Skull: Remote right pterional  craniectomy with cranioplasty and right suboccipital craniectomy. Sinuses/Orbits: No acute finding. Other: None IMPRESSION: 1. Unchanged size and configuration of the shunted ventricles. 2. Unchanged multifocal encephalomalacia in a pattern consistent with remote trauma. Electronically Signed   By: Deatra RobinsonKevin  Herman M.D.   On: 03/14/2021 03:08   CT CHEST ABDOMEN PELVIS W CONTRAST  Result Date: 03/14/2021 CLINICAL DATA:  History of TBI, altered behavior per caregiver, emesis today at 1400 hours, history of bleeding ulcers EXAM: CT CHEST, ABDOMEN, AND PELVIS WITH CONTRAST TECHNIQUE: Multidetector CT imaging of the chest, abdomen and pelvis was performed following the standard protocol during bolus administration of intravenous contrast. CONTRAST:  OMNIPAQUE IOHEXOL 300 MG/ML  SOLN COMPARISON:  Angiography 11/01/2015 FINDINGS: CT CHEST FINDINGS Cardiovascular: Normal heart size. No pericardial effusion. The aortic root is suboptimally assessed given cardiac pulsation artifact. The aorta is normal caliber. No acute luminal abnormality of the imaged aorta. No periaortic stranding or hemorrhage. Normal 3 vessel branching of the aortic arch. Proximal great vessels are unremarkable. Central pulmonary arteries are normal caliber. No large central or lobar filling defects with more distal evaluation limited on this non tailored examination of the pulmonary arteries. Mediastinum/Nodes: No mediastinal fluid or gas. Normal thyroid gland and thoracic inlet. No acute abnormality of the trachea or esophagus. Few scattered subcentimeter mediastinal and hilar nodes are likely reactive. No worrisome mediastinal, hilar or axillary adenopathy. Lungs/Pleura: Mild airways thickening and scattered secretions. Some patchy areas of ground-glass opacity are present predominantly in the right middle and both lower lobes including more focal consolidative density in the medial aspect of the right middle lobe. Some more minimal areas of  patchy ground-glass are seen in the right upper lobe as well. No pneumothorax. No effusion. Background of paraseptal predominant emphysematous changes. Musculoskeletal: Ventriculoperitoneal catheter tubing courses from the base of the left neck, crossing midline and extending along the right chest wall without visible discontinuity. No chest wall mass or suspicious bone lesions identified. CT ABDOMEN PELVIS FINDINGS Hepatobiliary: 12 mm fluid attenuation cyst seen in the posterior right lobe liver (2/52). No other focal liver lesion. Smooth surface contour. Hepatic attenuation remains within normal limits. No biliary ductal dilatation. No visible calcified gallstones. Gallbladder is unremarkable without pericholecystic fluid or inflammation. Pancreas: No pancreatic ductal dilatation or surrounding inflammatory changes. Spleen: Normal in size. No concerning splenic lesions. Adrenals/Urinary Tract: Normal adrenal glands. Kidneys are normally located with symmetric enhancementand excretion. Few subcentimeter hypoattenuating foci in the kidneys too small to fully characterize on CT imaging but statistically likely benign. No suspicious renal lesion, urolithiasis or hydronephrosis. Mild symmetric bilateral perinephric stranding, a nonspecific finding. Stomach/Bowel: Distal esophagus, stomach are unremarkable. Multiple vascular coil packs are seen towards the first portion of the duodenum compatible with prior percutaneous coil embolization seen on comparison angiography. No acute abnormality of the duodenal sweep. No small bowel thickening or dilatation is seen. Appendix coils about the cecal tip without acute periappendiceal inflammation to suggest acute appendicitis. No colonic dilatation or wall thickening. No bowel obstruction. Vascular/Lymphatic: No significant vascular findings are present. No enlarged abdominal or pelvic lymph nodes. Reproductive: Benign-appearing coarse eccentric calcification of the prostate. No  concerning abnormalities of the prostate or seminal vesicles. Small calcifications noted at the level of the glans. Included external genitalia is otherwise unremarkable. Other: Trace low-attenuation free fluid in the deep pelvis, nonspecific given the presence of a VP shunt catheter which is seen entering the right anterior abdominal wall coiling in the abdomen and terminating in the low midline. Small amount of fluid within a tiny umbilical fat containing hernia. No bowel containing hernias. Fat containing left inguinal hernia as well. Musculoskeletal: No acute osseous abnormality or suspicious osseous lesion. Sclerotic changes with subcortical cystic features and vacuum disc phenomenon centered at the L3-4 level, favored to be degenerative. IMPRESSION: 1. Some patchy areas of ground-glass opacity  predominantly in the right middle and both lower lobes including more focal consolidative density in the medial aspect of the right middle lobe. Some more minimal areas of patchy ground-glass are seen in the right upper lobe as well. Findings are could be seen as sequela of aspiration or atypical infection, including viral etiologies. 2. Trace low-attenuation free fluid in the deep pelvis, nonspecific given the presence of a VP shunt catheter. 3. Multiple vascular coil packs towards the first portion of the duodenum compatible with prior percutaneous coil embolization seen on comparison angiography. No acute duodenal abnormality at this time. 4. Mild symmetric bilateral perinephric stranding, a nonspecific finding. Can be an incidental finding seen with advanced age or diminished renal function though recommend correlation with urinalysis to exclude urinary tract infection. 5. Emphysema (ICD10-J43.9). Electronically Signed   By: Kreg Shropshire M.D.   On: 03/14/2021 00:57   DG Chest Port 1 View  Result Date: 03/14/2021 CLINICAL DATA:  Sepsis EXAM: PORTABLE CHEST 1 VIEW COMPARISON:  None. FINDINGS: The heart size and  mediastinal contours are within normal limits. Hazy airspace opacity is seen at the right lung base. The left lung is clear. No pleural effusion is seen. No acute osseous abnormality. IMPRESSION: Hazy airspace opacity at the right lung base which may be due to atelectasis and/or infectious etiology. Electronically Signed   By: Jonna Clark M.D.   On: 03/14/2021 00:22    ____________________________________________   PROCEDURES  Procedure(s) performed (including Critical Care):  Procedures  CRITICAL CARE Performed by: Baxter Hire Nastashia Gallo   Total critical care time: 65 minutes  Critical care time was exclusive of separately billable procedures and treating other patients.  Critical care was necessary to treat or prevent imminent or life-threatening deterioration.  Critical care was time spent personally by me on the following activities: development of treatment plan with patient and/or surrogate as well as nursing, discussions with consultants, evaluation of patient's response to treatment, examination of patient, obtaining history from patient or surrogate, ordering and performing treatments and interventions, ordering and review of laboratory studies, ordering and review of radiographic studies, pulse oximetry and re-evaluation of patient's condition.  ____________________________________________   INITIAL IMPRESSION / ASSESSMENT AND PLAN / ED COURSE  As part of my medical decision making, I reviewed the following data within the electronic MEDICAL RECORD NUMBER History obtained from family, Nursing notes reviewed and incorporated, Labs reviewed , EKG interpreted , Old EKG reviewed, Old chart reviewed, Radiograph reviewed , Discussed with admitting physician  and Notes from prior ED visits         Patient here with concerns for vomiting, flushed face.  Found to be tachycardic in triage.  Here he is hypotensive with a blood pressure in the 80s systolic and a rectal temperature of 100.5.  Will  initiate septic work-up.  Initial labs obtained in triage are unremarkable other than a sodium level of 127.  Will add on cultures, obtain urine and give IV fluids and broad-spectrum antibiotics.  He does have an oxygen saturation of 89% on room air.  Will place on 2 L nasal cannula and obtain chest x-ray, Covid and flu swabs.  He does not wear oxygen chronically.  Also obtain CT of his abdomen pelvis given complaints of vomiting today.  Is very difficult to elicit if patient is having any pain due to his TBI.  Caregiver at bedside also reports he has had 2 seizures while he has been here.  Will give him his home doses of Lamictal and  Tegretol.  We will continue to monitor for any seizure-like activity.  EKG shows no ischemia, interval abnormality, arrhythmia.  ED PROGRESS  1:15 AM  [name] blood pressure has improved.  Satting 93% currently.  Blood pressure 123/66.  2:33 AM [name] blood pressure has dropped again.  Will give another liter of IV fluids.  He has received 30 mL/kg bolus.  Initial lactate was 3.1.  Now improved to 2.4.  COVID, flu negative.  Urine shows no sign of infection.  CT chest, abdomen and pelvis show patchy areas of groundglass opacities in the right middle and lower lobes with a more focal consolidative density in the medial aspect of the right middle lobe that could be sequela of aspiration versus atypical infection.  He also has some free fluid in the deep pelvis that is nonspecific in the presence of a VP shunt catheter.  He has some bilateral perinephric stranding but urine does not show infection.  Urine culture is pending.  Given that patient has a VP shunt which I was not aware of from history provided by caregiver, will obtain CT of the head.  He has no meningismus on exam.  He has received broad-spectrum antibiotics.  3:20 AM  [name] blood pressure is improved again with IV fluids.  CT head shows no acute abnormality.  Family reports they do not think his VP shunt is functioning.   Will discuss with hospitalist for admission for septic shock due to pneumonia.   3:47 AM Discussed patient's case with hospitalist, Dr. Para March.  I have recommended admission and patient (and family if present) agree with this plan. Admitting physician will place admission orders.   I reviewed all nursing notes, vitals, pertinent previous records and reviewed/interpreted all EKGs, lab and urine results, imaging (as available).   ____________________________________________   FINAL CLINICAL IMPRESSION(S) / ED DIAGNOSES  Final diagnoses:  Septic shock (HCC)  Community acquired pneumonia of right lung, unspecified part of lung     ED Discharge Orders    None      *Please note:  KELLAN RAFFIELD was evaluated in Emergency Department on 03/14/2021 for the symptoms described in the history of present illness. He was evaluated in the context of the global COVID-19 pandemic, which necessitated consideration that the patient might be at risk for infection with the SARS-CoV-2 virus that causes COVID-19. Institutional protocols and algorithms that pertain to the evaluation of patients at risk for COVID-19 are in a state of rapid change based on information released by regulatory bodies including the CDC and federal and state organizations. These policies and algorithms were followed during the patient's care in the ED.  Some ED evaluations and interventions may be delayed as a result of limited staffing during and the pandemic.*   Note:  This document was prepared using Dragon voice recognition software and may include unintentional dictation errors.   Trisha Ken, Layla Maw, DO 03/14/21 [phone]

## 2021-03-13 NOTE — Progress Notes (Signed)
CODE SEPSIS - PHARMACY COMMUNICATION  **Broad Spectrum Antibiotics should be administered within 1 hour of Sepsis diagnosis**  Time Code Sepsis Called/Page Received: 2344  Antibiotics Ordered: Cefepime, Vanc, Flagyl  Time of 1st antibiotic administration: 0015  Otelia Sergeant, PharmD, [facility] 03/13/2021 11:58 PM

## 2021-03-13 NOTE — Progress Notes (Signed)
PHARMACY -  BRIEF ANTIBIOTIC NOTE   Pharmacy has received consult(s) for Cefepime and Vancomycin from an ED provider.  The patient's profile has been reviewed for ht/wt/allergies/indication/available labs.    One time order(s) placed for Cefepime 2 gm and Vancomycin 1750 mg per pt wt: 77.1 kg.  Further antibiotics/pharmacy consults should be ordered by admitting physician if indicated.                       Thank you, Otelia Sergeant, PharmD, [facility] 03/13/2021 11:59 PM

## 2021-03-13 NOTE — ED Triage Notes (Signed)
Pt presents to ER with caregiver.  Pt has hx of TBI and per caregiver, has not been acting like himself since yesterday.  Pt has had one episode of emesis today around 1400. Caregiver concerned d/t pt's hx of bleeding ulcers.  [name] face very flushed in triage, and gait is more off than normal per caregiver.

## 2021-03-14 ENCOUNTER — Emergency Department: Payer: PPO

## 2021-03-14 ENCOUNTER — Encounter: Payer: Self-pay | Admitting: Radiology

## 2021-03-14 DIAGNOSIS — J690 Pneumonitis due to inhalation of food and vomit: Secondary | ICD-10-CM | POA: Diagnosis present

## 2021-03-14 DIAGNOSIS — R0602 Shortness of breath: Secondary | ICD-10-CM | POA: Diagnosis present

## 2021-03-14 DIAGNOSIS — Z886 Allergy status to analgesic agent status: Secondary | ICD-10-CM | POA: Diagnosis not present

## 2021-03-14 DIAGNOSIS — A419 Sepsis, unspecified organism: Secondary | ICD-10-CM | POA: Diagnosis present

## 2021-03-14 DIAGNOSIS — Z8711 Personal history of peptic ulcer disease: Secondary | ICD-10-CM | POA: Diagnosis not present

## 2021-03-14 DIAGNOSIS — G40909 Epilepsy, unspecified, not intractable, without status epilepticus: Secondary | ICD-10-CM | POA: Diagnosis present

## 2021-03-14 DIAGNOSIS — F0391 Unspecified dementia with behavioral disturbance: Secondary | ICD-10-CM | POA: Diagnosis present

## 2021-03-14 DIAGNOSIS — D509 Iron deficiency anemia, unspecified: Secondary | ICD-10-CM | POA: Diagnosis present

## 2021-03-14 DIAGNOSIS — R4189 Other symptoms and signs involving cognitive functions and awareness: Secondary | ICD-10-CM | POA: Diagnosis present

## 2021-03-14 DIAGNOSIS — Z888 Allergy status to other drugs, medicaments and biological substances status: Secondary | ICD-10-CM | POA: Diagnosis not present

## 2021-03-14 DIAGNOSIS — Z87820 Personal history of traumatic brain injury: Secondary | ICD-10-CM | POA: Diagnosis not present

## 2021-03-14 DIAGNOSIS — R6520 Severe sepsis without septic shock: Secondary | ICD-10-CM

## 2021-03-14 DIAGNOSIS — Z79899 Other long term (current) drug therapy: Secondary | ICD-10-CM | POA: Diagnosis not present

## 2021-03-14 DIAGNOSIS — Z982 Presence of cerebrospinal fluid drainage device: Secondary | ICD-10-CM | POA: Diagnosis not present

## 2021-03-14 DIAGNOSIS — Z20822 Contact with and (suspected) exposure to covid-19: Secondary | ICD-10-CM | POA: Diagnosis present

## 2021-03-14 DIAGNOSIS — Z87891 Personal history of nicotine dependence: Secondary | ICD-10-CM | POA: Diagnosis not present

## 2021-03-14 DIAGNOSIS — R6521 Severe sepsis with septic shock: Secondary | ICD-10-CM | POA: Diagnosis present

## 2021-03-14 DIAGNOSIS — E871 Hypo-osmolality and hyponatremia: Secondary | ICD-10-CM | POA: Diagnosis present

## 2021-03-14 DIAGNOSIS — Z8719 Personal history of other diseases of the digestive system: Secondary | ICD-10-CM

## 2021-03-14 DIAGNOSIS — J189 Pneumonia, unspecified organism: Secondary | ICD-10-CM

## 2021-03-14 LAB — CBC WITH DIFFERENTIAL/PLATELET
Abs Immature Granulocytes: 0.02 10*3/uL (ref 0.00–0.07)
Basophils Absolute: 0 10*3/uL (ref 0.0–0.1)
Basophils Relative: 0 %
Eosinophils Absolute: 0 10*3/uL (ref 0.0–0.5)
Eosinophils Relative: 0 %
HCT: 42.6 % (ref 39.0–52.0)
Hemoglobin: 14.5 g/dL (ref 13.0–17.0)
Immature Granulocytes: 0 %
Lymphocytes Relative: 2 %
Lymphs Abs: 0.2 10*3/uL — ABNORMAL LOW (ref 0.7–4.0)
MCH: 32.2 pg (ref 26.0–34.0)
MCHC: 34 g/dL (ref 30.0–36.0)
MCV: 94.5 fL (ref 80.0–100.0)
Monocytes Absolute: 0.3 10*3/uL (ref 0.1–1.0)
Monocytes Relative: 3 %
Neutro Abs: 9.3 10*3/uL — ABNORMAL HIGH (ref 1.7–7.7)
Neutrophils Relative %: 95 %
Platelets: 244 10*3/uL (ref 150–400)
RBC: 4.51 MIL/uL (ref 4.22–5.81)
RDW: 11.9 % (ref 11.5–15.5)
WBC: 9.9 10*3/uL (ref 4.0–10.5)
nRBC: 0 % (ref 0.0–0.2)

## 2021-03-14 LAB — CBC
HCT: 32.9 % — ABNORMAL LOW (ref 39.0–52.0)
Hemoglobin: 11.4 g/dL — ABNORMAL LOW (ref 13.0–17.0)
MCH: 32.4 pg (ref 26.0–34.0)
MCHC: 34.7 g/dL (ref 30.0–36.0)
MCV: 93.5 fL (ref 80.0–100.0)
Platelets: 166 10*3/uL (ref 150–400)
RBC: 3.52 MIL/uL — ABNORMAL LOW (ref 4.22–5.81)
RDW: 12 % (ref 11.5–15.5)
WBC: 12 10*3/uL — ABNORMAL HIGH (ref 4.0–10.5)
nRBC: 0 % (ref 0.0–0.2)

## 2021-03-14 LAB — PROCALCITONIN: Procalcitonin: 2.45 ng/mL

## 2021-03-14 LAB — PROTIME-INR
INR: 1 (ref 0.8–1.2)
Prothrombin Time: 13 seconds (ref 11.4–15.2)

## 2021-03-14 LAB — LACTIC ACID, PLASMA
Lactic Acid, Venous: 2.4 mmol/L (ref 0.5–1.9)
Lactic Acid, Venous: 3.1 mmol/L (ref 0.5–1.9)

## 2021-03-14 LAB — CREATININE, SERUM
Creatinine, Ser: 0.95 mg/dL (ref 0.61–1.24)
GFR, Estimated: >60 mL/min (ref >60)

## 2021-03-14 LAB — RESP PANEL BY RT-PCR (FLU A&B, COVID) ARPGX2
Influenza A by PCR: NEGATIVE
Influenza B by PCR: NEGATIVE
SARS Coronavirus 2 by RT PCR: NEGATIVE

## 2021-03-14 LAB — URINALYSIS, COMPLETE (UACMP) WITH MICROSCOPIC
Bacteria, UA: NONE SEEN
Bilirubin Urine: NEGATIVE
Glucose, UA: NEGATIVE mg/dL
Hgb urine dipstick: NEGATIVE
Ketones, ur: NEGATIVE mg/dL
Leukocytes,Ua: NEGATIVE
Nitrite: NEGATIVE
Protein, ur: NEGATIVE mg/dL
Specific Gravity, Urine: 1.017 (ref 1.005–1.030)
Squamous Epithelial / HPF: NONE SEEN (ref 0–5)
pH: 6 (ref 5.0–8.0)

## 2021-03-14 LAB — CORTISOL-AM, BLOOD: Cortisol - AM: 13.5 ug/dL (ref 6.7–22.6)

## 2021-03-14 LAB — BASIC METABOLIC PANEL
Anion gap: 7 (ref 5–15)
BUN: 13 mg/dL (ref 6–20)
CO2: 25 mmol/L (ref 22–32)
Calcium: 8.1 mg/dL — ABNORMAL LOW (ref 8.9–10.3)
Chloride: 99 mmol/L (ref 98–111)
Creatinine, Ser: 0.89 mg/dL (ref 0.61–1.24)
GFR, Estimated: >60 mL/min (ref >60)
Glucose, Bld: 108 mg/dL — ABNORMAL HIGH (ref 70–99)
Potassium: 4.1 mmol/L (ref 3.5–5.1)
Sodium: 131 mmol/L — ABNORMAL LOW (ref 135–145)

## 2021-03-14 LAB — TROPONIN I (HIGH SENSITIVITY): Troponin I (High Sensitivity): 10 ng/L (ref <18)

## 2021-03-14 LAB — APTT: aPTT: 36 seconds (ref 24–36)

## 2021-03-14 LAB — HIV ANTIBODY (ROUTINE TESTING W REFLEX): HIV Screen 4th Generation wRfx: NONREACTIVE

## 2021-03-14 MED ORDER — ADULT MULTIVITAMIN W/MINERALS CH
1.0000 | ORAL_TABLET | Freq: Every day | ORAL | Status: DC
  Administered 2021-03-15 – 2021-03-16 (×2): 1 via ORAL
  Filled 2021-03-14 (×2): qty 1

## 2021-03-14 MED ORDER — SODIUM CHLORIDE 0.9 % IV SOLN
500.0000 mg | INTRAVENOUS | Status: DC
  Administered 2021-03-14 – 2021-03-16 (×3): 500 mg via INTRAVENOUS
  Filled 2021-03-14 (×3): qty 500

## 2021-03-14 MED ORDER — LAMOTRIGINE 100 MG PO TABS
200.0000 mg | ORAL_TABLET | Freq: Two times a day (BID) | ORAL | Status: DC
  Administered 2021-03-14 – 2021-03-16 (×4): 200 mg via ORAL
  Filled 2021-03-14 (×4): qty 2

## 2021-03-14 MED ORDER — ONDANSETRON HCL 4 MG/2ML IJ SOLN: 4.0000 mg | Freq: Four times a day (QID) | INTRAMUSCULAR | Status: DC | PRN

## 2021-03-14 MED ORDER — PANTOPRAZOLE SODIUM 40 MG PO TBEC
40.0000 mg | DELAYED_RELEASE_TABLET | Freq: Two times a day (BID) | ORAL | Status: DC
  Administered 2021-03-14 – 2021-03-16 (×4): 40 mg via ORAL
  Filled 2021-03-14 (×4): qty 1

## 2021-03-14 MED ORDER — ESCITALOPRAM OXALATE 10 MG PO TABS
20.0000 mg | ORAL_TABLET | Freq: Every day | ORAL | Status: DC
  Administered 2021-03-15 – 2021-03-16 (×2): 20 mg via ORAL
  Filled 2021-03-14 (×2): qty 2

## 2021-03-14 MED ORDER — ONDANSETRON HCL 4 MG PO TABS: 4.0000 mg | ORAL_TABLET | Freq: Four times a day (QID) | ORAL | Status: DC | PRN

## 2021-03-14 MED ORDER — ATORVASTATIN CALCIUM 20 MG PO TABS
20.0000 mg | ORAL_TABLET | Freq: Every day | ORAL | Status: DC
  Administered 2021-03-14 – 2021-03-16 (×3): 20 mg via ORAL
  Filled 2021-03-14 (×3): qty 1

## 2021-03-14 MED ORDER — ACETAMINOPHEN 325 MG PO TABS
650.0000 mg | ORAL_TABLET | Freq: Four times a day (QID) | ORAL | Status: DC | PRN
  Administered 2021-03-14: 650 mg via ORAL
  Filled 2021-03-14: qty 2

## 2021-03-14 MED ORDER — ENOXAPARIN SODIUM 40 MG/0.4ML ~~LOC~~ SOLN
40.0000 mg | SUBCUTANEOUS | Status: DC
  Administered 2021-03-14 – 2021-03-16 (×3): 40 mg via SUBCUTANEOUS
  Filled 2021-03-14 (×3): qty 0.4

## 2021-03-14 MED ORDER — SODIUM CHLORIDE 0.9 % IV SOLN
2.0000 g | INTRAVENOUS | Status: DC
  Administered 2021-03-14 – 2021-03-16 (×3): 2 g via INTRAVENOUS
  Filled 2021-03-14 (×3): qty 20

## 2021-03-14 MED ORDER — LACTATED RINGERS IV BOLUS
1000.0000 mL | Freq: Once | INTRAVENOUS | Status: AC
  Administered 2021-03-14: 1000 mL via INTRAVENOUS

## 2021-03-14 MED ORDER — IOHEXOL 300 MG/ML  SOLN
100.0000 mL | Freq: Once | INTRAMUSCULAR | Status: AC | PRN
  Administered 2021-03-14: 100 mL via INTRAVENOUS

## 2021-03-14 MED ORDER — CARBAMAZEPINE ER 300 MG PO CP12: 300.0000 mg | ORAL_CAPSULE | Freq: Two times a day (BID) | ORAL | Status: DC

## 2021-03-14 MED ORDER — ACETAMINOPHEN 650 MG RE SUPP: 650.0000 mg | Freq: Four times a day (QID) | RECTAL | Status: DC | PRN

## 2021-03-14 MED ORDER — LACTATED RINGERS IV SOLN: INTRAVENOUS | Status: DC

## 2021-03-14 MED ORDER — TAMSULOSIN HCL 0.4 MG PO CAPS
0.4000 mg | ORAL_CAPSULE | Freq: Every day | ORAL | Status: DC
  Administered 2021-03-15 – 2021-03-16 (×2): 0.4 mg via ORAL
  Filled 2021-03-14 (×2): qty 1

## 2021-03-14 NOTE — ED Notes (Signed)
Received call from lab for critical lactic acid of 3.1. Dr. Elesa Massed notified.

## 2021-03-14 NOTE — Progress Notes (Addendum)
PROGRESS NOTE    Zachary Thornton  AVW:[id number] DOB: Oct 19, 1966 DOA: 03/13/2021 PCP: [name], MD   Brief Narrative: 55 year old with past medical history significant for chronic encephalopathy, intravascular shunt, seizure disorder, bleeding duodenal ulcer in 2006 requiring 9 units of packed red blood cell and coil embolization who presents to the emergency room following an episode of vomiting preceded by malaise.  Vomitus was nonbloody or coffee-ground.  Patient was previously in his usual state of health.  He denies abdominal pain, fevers or chills.  Denies cough or shortness of breath.  Caregiver was unsure whether he had black or bloody stool.  ED course on arrival he was tachycardic heart rate 122, tachypnea respiration rate 28 oxygen saturation 93 on room air.  Blood pressure 108/75 subsequently decreased to 87/62.  Spiking fever 100.5.  Lactic acid 3.1, troponin negative.  Lipase normal, UA unremarkable.  Sodium 127.  EKG no acute ST-T wave changes.  CT head no changes from before.  CT chest abdomen and pelvis with contrast showed patchy area of groundglass opacity predominantly right middle lobe consistent with infection and sequela of aspiration.  No acute duodenal abnormality.      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 -CT chest abdomen and pelvis with contrast significant for pneumonia without other acute finding. -Continue with IV fluids -Continue with IV ceftriaxone and azithromycin -We will need a speech and swallow evaluation  2-Traumatic brain injury with depressed frontal skull fracture, cognitive  deficit as late effect of traumatic brain injury, interventricular shunt: -CT Head stable finding -Supportive care -PT OT eval  3-Seizure Disorder:  -Continue with carbamazepine and Lamictal  4-History of duodenal ulcer with hemorrhage. CT abdomen  and pelvis with not acute duodenal  abnormality -Continue with PPI  Estimated body mass index is 25.85 kg/m as calculated from the following:   Height as of this encounter: 5\' 8"  (1.727 m).   Weight as of this encounter: 77.1 kg.   DVT prophylaxis: Lovenox Code Status: Full code Family Communication: Mother updated at bedside. 3-23/2022 Disposition Plan:  Status is: Inpatient  Remains inpatient appropriate because:IV treatments appropriate due to intensity of illness or inability to take PO   Dispo: The patient is from: Home              Anticipated d/c is to: Home              Patient currently is not medically stable to d/c.   Difficult to place patient No        Consultants:   none  Procedures:   none  Antimicrobials:  Ceftriaxone and Azithromycin.   Subjective: He denies cough, abdominal pain.    Objective: Vitals:   03/14/21 1200 03/14/21 1300 03/14/21 1315 03/14/21 1500  BP: (!) 166/129 126/68  131/83  Pulse: 80 84 90 89  Resp: 11 13 (!) 21 15  Temp:      TempSrc:      SpO2: 100% (!) 87% 95% 98%  Weight:      Height:        Intake/Output Summary (Last 24 hours) at 03/14/2021 1525 Last data filed at 03/14/2021 1317 Gross  per 24 hour  Intake 4699.78 ml  Output 900 ml  Net 3799.78 ml   Filed Weights   03/13/21 2009  Weight: 77.1 kg    Examination:  General exam: Appears calm and comfortable  Respiratory system: Clear to auscultation. Respiratory effort normal. Cardiovascular system: S1 & S2 heard, RRR. No JVD, murmurs, rubs, gallops or clicks. No pedal edema. Gastrointestinal system: Abdomen is nondistended, soft and nontender.  Central nervous system: Alert  Extremities: Symmetric 5 x 5  power.    Data Reviewed: I have personally reviewed following labs and imaging studies  CBC: Recent Labs  Lab 03/13/21 2022 03/14/21 0445  WBC 9.9   10.4 12.0*  NEUTROABS 9.3*  --   HGB 14.5   14.6 11.4*  HCT 42.6   42.3 32.9*  MCV 94.5   92.8 93.5  PLT 244   221 166   Basic Metabolic Panel: Recent Labs  Lab 03/13/21 2022 03/14/21 0445 03/14/21 0906  NA 127*  --  131*  K 4.0  --  4.1  CL 92*  --  99  CO2 23  --  25  GLUCOSE 158*  --  108*  BUN 16  --  13  CREATININE 0.98 0.95 0.89  CALCIUM 9.0  --  8.1*   GFR: Estimated Creatinine Clearance: 91.8 mL/min (by C-G formula based on SCr of 0.89 mg/dL). Liver Function Tests: Recent Labs  Lab 03/13/21 2022  AST 27  ALT 21  ALKPHOS 92  BILITOT 0.8  PROT 7.4  ALBUMIN 4.4   Recent Labs  Lab 03/13/21 2022  LIPASE 28   No results for input(s): AMMONIA in the last 168 hours. Coagulation Profile: Recent Labs  Lab 03/13/21 2349  INR 1.0   Cardiac Enzymes: No results for input(s): CKTOTAL, CKMB, CKMBINDEX, TROPONINI in the last 168 hours. BNP (last 3 results) No results for input(s): PROBNP in the last 8760 hours. HbA1C: No results for input(s): HGBA1C in the last 72 hours. CBG: No results for input(s): GLUCAP in the last 168 hours. Lipid Profile: No results for input(s): CHOL, HDL, LDLCALC, TRIG, CHOLHDL, LDLDIRECT in the last 72 hours. Thyroid Function Tests: No results for input(s): TSH, T4TOTAL, FREET4, T3FREE, THYROIDAB in the last 72 hours. Anemia Panel: No results for input(s): VITAMINB12, FOLATE, FERRITIN, TIBC, IRON, RETICCTPCT in the last 72 hours. Sepsis Labs: Recent Labs  Lab 03/13/21 2349 03/14/21 0139 03/14/21 0445  PROCALCITON  --   --  2.45  LATICACIDVEN 3.1* 2.4*  --     Recent Results (from the past 240 hour(s))  Blood Culture (routine x 2)     Status: None (Preliminary result)   Collection Time: 03/13/21 11:49 PM   Specimen: BLOOD  Result Value Ref Range Status   Specimen  Description BLOOD RIGHT ANTECUBITAL  Final   Special Requests   Final    BOTTLES DRAWN AEROBIC AND ANAEROBIC Blood Culture adequate volume   Culture   Final    NO GROWTH < 12 HOURS Performed at [facility], [address] [place], [place] [address]    Report Status PENDING  Incomplete  Blood Culture (routine x 2)     Status: None (Preliminary result)   Collection Time: 03/14/21 12:10 AM   Specimen: BLOOD  Result Value Ref Range Status   Specimen Description BLOOD BLOOD LEFT WRIST  Final   Special Requests   Final    BOTTLES DRAWN AEROBIC AND ANAEROBIC Blood Culture results may not be optimal due to an inadequate  volume of blood received in culture bottles   Culture   Final    NO GROWTH < 12 HOURS Performed at [facility], [address] [place], [place] [address]    Report Status PENDING  Incomplete  Resp Panel by RT-PCR (Flu A&B, Covid) Nasopharyngeal Swab     Status: None   Collection Time: 03/14/21 12:11 AM   Specimen: Nasopharyngeal Swab; Nasopharyngeal(NP) swabs in vial transport medium  Result Value Ref Range Status   SARS Coronavirus 2 by RT PCR NEGATIVE NEGATIVE Final    Comment: (NOTE) SARS-CoV-2 target nucleic acids are NOT DETECTED.  The SARS-CoV-2 RNA is generally detectable in upper respiratory specimens during the acute phase of infection. The lowest concentration of SARS-CoV-2 viral copies this assay can detect is 138 copies/mL. A negative result does not preclude SARS-Cov-2 infection and should not be used as the sole basis for treatment or other patient management decisions. A negative result may occur with  improper specimen collection/handling, submission of specimen other than nasopharyngeal swab, presence of viral mutation(s) within the areas targeted by this assay, and inadequate number of viral copies(<138 copies/mL). A negative result must be combined with clinical observations, patient history, and epidemiological information.  The expected result is Negative.  Fact Sheet for Patients:  BloggerCourse.com  Fact Sheet for Healthcare Providers:  SeriousBroker.it  This test is no t yet approved or cleared by the [place] FDA and  has been authorized for detection and/or diagnosis of SARS-CoV-2 by FDA under an Emergency Use Authorization (EUA). This EUA will remain  in effect (meaning this test can be used) for the duration of the COVID-19 declaration under Section 564(b)(1) of the Act, 21 U.S.C.section 360bbb-3(b)(1), unless the authorization is terminated  or revoked sooner.       Influenza A by PCR NEGATIVE NEGATIVE Final   Influenza B by PCR NEGATIVE NEGATIVE Final    Comment: (NOTE) The Xpert Xpress SARS-CoV-2/FLU/RSV plus assay is intended as an aid in the diagnosis of influenza from Nasopharyngeal swab specimens and should not be used as a sole basis for treatment. Nasal washings and aspirates are unacceptable for Xpert Xpress SARS-CoV-2/FLU/RSV testing.  Fact Sheet for Patients: BloggerCourse.com  Fact Sheet for Healthcare Providers: SeriousBroker.it  This test is not yet approved or cleared by the [place] FDA and has been authorized for detection and/or diagnosis of SARS-CoV-2 by FDA under an Emergency Use Authorization (EUA). This EUA will remain in effect (meaning this test can be used) for the duration of the COVID-19 declaration under Section 564(b)(1) of the Act, 21 U.S.C. section 360bbb-3(b)(1), unless the authorization is terminated or revoked.  Performed at [facility], [address] [place], [place] [address]          Radiology Studies: CT Head Wo Contrast  Result Date: 03/14/2021 CLINICAL DATA:  Shunt follow-up EXAM: CT HEAD WITHOUT CONTRAST TECHNIQUE: Contiguous axial images were obtained from the base of the skull through the vertex without intravenous  contrast. COMPARISON:  03/23/2017 FINDINGS: Brain: There is a left frontal approach shunt catheter with tip in the frontal horn of the left lateral ventricle. Unchanged appearance of dural hyperdensity underlying the right-sided craniotomy. Bifrontal and bitemporal encephalomalacia and bilateral cerebellar encephalomalacia is unchanged. No acute hemorrhage. Size and configuration of the ventricles are unchanged. Vascular: No hyperdense vessel or unexpected calcification. Skull: Remote right pterional craniectomy with cranioplasty and right suboccipital craniectomy. Sinuses/Orbits: No acute finding. Other: None IMPRESSION: 1. Unchanged size and configuration of the shunted ventricles.  2. Unchanged multifocal encephalomalacia in a pattern consistent with remote trauma. Electronically Signed   By: Deatra Robinson M.D.   On: 03/14/2021 03:08   CT CHEST ABDOMEN PELVIS W CONTRAST  Result Date: 03/14/2021 CLINICAL DATA:  History of TBI, altered behavior per caregiver, emesis today at 1400 hours, history of bleeding ulcers EXAM: CT CHEST, ABDOMEN, AND PELVIS WITH CONTRAST TECHNIQUE: Multidetector CT imaging of the chest, abdomen and pelvis was performed following the standard protocol during bolus administration of intravenous contrast. CONTRAST:  OMNIPAQUE IOHEXOL 300 MG/ML  SOLN COMPARISON:  Angiography 11/01/2015 FINDINGS: CT CHEST FINDINGS Cardiovascular: Normal heart size. No pericardial effusion. The aortic root is suboptimally assessed given cardiac pulsation artifact. The aorta is normal caliber. No acute luminal abnormality of the imaged aorta. No periaortic stranding or hemorrhage. Normal 3 vessel branching of the aortic arch. Proximal great vessels are unremarkable. Central pulmonary arteries are normal caliber. No large central or lobar filling defects with more distal evaluation limited on this non tailored examination of the pulmonary arteries. Mediastinum/Nodes: No mediastinal fluid or gas. Normal  thyroid gland and thoracic inlet. No acute abnormality of the trachea or esophagus. Few scattered subcentimeter mediastinal and hilar nodes are likely reactive. No worrisome mediastinal, hilar or axillary adenopathy. Lungs/Pleura: Mild airways thickening and scattered secretions. Some patchy areas of ground-glass opacity are present predominantly in the right middle and both lower lobes including more focal consolidative density in the medial aspect of the right middle lobe. Some more minimal areas of patchy ground-glass are seen in the right upper lobe as well. No pneumothorax. No effusion. Background of paraseptal predominant emphysematous changes. Musculoskeletal: Ventriculoperitoneal catheter tubing courses from the base of the left neck, crossing midline and extending along the right chest wall without visible discontinuity. No chest wall mass or suspicious bone lesions identified. CT ABDOMEN PELVIS FINDINGS Hepatobiliary: 12 mm fluid attenuation cyst seen in the posterior right lobe liver (2/52). No other focal liver lesion. Smooth surface contour. Hepatic attenuation remains within normal limits. No biliary ductal dilatation. No visible calcified gallstones. Gallbladder is unremarkable without pericholecystic fluid or inflammation. Pancreas: No pancreatic ductal dilatation or surrounding inflammatory changes. Spleen: Normal in size. No concerning splenic lesions. Adrenals/Urinary Tract: Normal adrenal glands. Kidneys are normally located with symmetric enhancementand excretion. Few subcentimeter hypoattenuating foci in the kidneys too small to fully characterize on CT imaging but statistically likely benign. No suspicious renal lesion, urolithiasis or hydronephrosis. Mild symmetric bilateral perinephric stranding, a nonspecific finding. Stomach/Bowel: Distal esophagus, stomach are unremarkable. Multiple vascular coil packs are seen towards the first portion of the duodenum compatible with prior percutaneous  coil embolization seen on comparison angiography. No acute abnormality of the duodenal sweep. No small bowel thickening or dilatation is seen. Appendix coils about the cecal tip without acute periappendiceal inflammation to suggest acute appendicitis. No colonic dilatation or wall thickening. No bowel obstruction. Vascular/Lymphatic: No significant vascular findings are present. No enlarged abdominal or pelvic lymph nodes. Reproductive: Benign-appearing coarse eccentric calcification of the prostate. No concerning abnormalities of the prostate or seminal vesicles. Small calcifications noted at the level of the glans. Included external genitalia is otherwise unremarkable. Other: Trace low-attenuation free fluid in the deep pelvis, nonspecific given the presence of a VP shunt catheter which is seen entering the right anterior abdominal wall coiling in the abdomen and terminating in the low midline. Small amount of fluid within a tiny umbilical fat containing hernia. No bowel containing hernias. Fat containing left inguinal hernia as well. Musculoskeletal: No  acute osseous abnormality or suspicious osseous lesion. Sclerotic changes with subcortical cystic features and vacuum disc phenomenon centered at the L3-4 level, favored to be degenerative. IMPRESSION: 1. Some patchy areas of ground-glass opacity predominantly in the right middle and both lower lobes including more focal consolidative density in the medial aspect of the right middle lobe. Some more minimal areas of patchy ground-glass are seen in the right upper lobe as well. Findings are could be seen as sequela of aspiration or atypical infection, including viral etiologies. 2. Trace low-attenuation free fluid in the deep pelvis, nonspecific given the presence of a VP shunt catheter. 3. Multiple vascular coil packs towards the first portion of the duodenum compatible with prior percutaneous coil embolization seen on comparison angiography. No acute duodenal  abnormality at this time. 4. Mild symmetric bilateral perinephric stranding, a nonspecific finding. Can be an incidental finding seen with advanced age or diminished renal function though recommend correlation with urinalysis to exclude urinary tract infection. 5. Emphysema (ICD10-J43.9). Electronically Signed   By: Kreg Shropshire M.D.   On: 03/14/2021 00:57   DG Chest Port 1 View  Result Date: 03/14/2021 CLINICAL DATA:  Sepsis EXAM: PORTABLE CHEST 1 VIEW COMPARISON:  None. FINDINGS: The heart size and mediastinal contours are within normal limits. Hazy airspace opacity is seen at the right lung base. The left lung is clear. No pleural effusion is seen. No acute osseous abnormality. IMPRESSION: Hazy airspace opacity at the right lung base which may be due to atelectasis and/or infectious etiology. Electronically Signed   By: Jonna Clark M.D.   On: 03/14/2021 00:22        Scheduled Meds:  enoxaparin (LOVENOX) injection  40 mg Subcutaneous Q24H   Continuous Infusions:  azithromycin Stopped (03/14/21 0702)   cefTRIAXone (ROCEPHIN)  IV Stopped (03/14/21 0519)   lactated ringers 100 mL/hr at 03/14/21 0905     LOS: 0 days    Time spent: 35 minutes    Dontai Pember A Demarkus Remmel, MD [facility]   If 7PM-7AM, please contact night-coverage [email or web address]  03/14/2021, 3:25 PM

## 2021-03-14 NOTE — ED Notes (Signed)
Spoke with speech regarding Eval . They stated eval probably wont happen till tomorrow, but advised to give meds ( crushed ) with apple sauce

## 2021-03-14 NOTE — ED Notes (Signed)
Speech at bedside

## 2021-03-14 NOTE — ED Notes (Signed)
Patient taken to CT.

## 2021-03-14 NOTE — Plan of Care (Signed)
Continues to Progress.

## 2021-03-14 NOTE — Progress Notes (Signed)
Clinical/Bedside Swallow Evaluation Zachary Thornton Details  Name: Zachary Thornton MRN: [id number] Date of Birth: 12/28/1965  Today's Date: 03/14/2021 Time: SLP Start Time (ACUTE ONLY): 1615 SLP Stop Time (ACUTE ONLY): 1640 SLP Time Calculation (min) (ACUTE ONLY): 25 min  Past Medical History:  Past Medical History:  Diagnosis Date   Back injury    Blood loss anemia    Dementia with behavioral disturbance (HCC)    Duodenal ulcer hemorrhage    GI bleed    Seizures (HCC)    TBI (traumatic brain injury) (HCC)    Past Surgical History:  Past Surgical History:  Procedure Laterality Date   CRANIOTOMY     Post TBI craniotomy and plate insertion   CSF SHUNT     ESOPHAGOGASTRODUODENOSCOPY N/A 11/01/2015   Procedure: ESOPHAGOGASTRODUODENOSCOPY (EGD);  Surgeon: Ruffin Frederick, MD;  Location: [facility];  Service: Gastroenterology;  Laterality: N/A;   ESOPHAGOGASTRODUODENOSCOPY (EGD) WITH PROPOFOL N/A 10/30/2015   Procedure: ESOPHAGOGASTRODUODENOSCOPY (EGD) WITH PROPOFOL;  Surgeon: Rachael Fee, MD;  Location: [facility];  Service: Endoscopy;  Laterality: N/A;   HPI:  Zachary Thornton is a Zachary Thornton with past medical history of severe TBI (2012) followed by Dr. Everlena Cooper, chronic encephalopathy, seizure disorder, VP shunt who presented to ED after episode of vomiting; CT chest abdomen pelvis showed groundglass opacity in right middle lobe consistent with infection and sequela of aspiration. Fever 100.5.   Assessment / Plan / Recommendation Clinical Impression   Zachary Thornton appears at bedside to have functional oropharyngeal swallow with adequate airway protection. Oral motor examination is within functional limits, Zachary Thornton is alert, cooperative and following basic commands. He has baseline cognitive impairments due to TBI; history was gathered from caregiver prior to assessment. He has been consuming regular diet with thin liquids without difficulty for many years; he did have PEG briefly during the  acute stage of his TBI. Zachary Thornton was able to self-feed thin liquids via cup and straw, puree and regular solids with adequate oral containment, appearance of timely swallow initiation, good oral clearance. Vocal quality remains clear, there are no overt signs of aspiration with any consistency despite challenging with sequential boluses. Zachary Thornton is somewhat impulsive, likely due to prolonged NPO status/ brain injury. Even with rapid intake appears to have adequate airway protection. Educated pt to slow rate; he was able to follow commands for this. Recommend regular diet with thin liquids, meds whole with liquid, general aspiration precautions including upright posture during and for 30 minutes after meals. D/w RN to monitor with first meal and remind pt to slow rate. No further skilled ST is indicated at this time.   SLP Visit Diagnosis: Dysphagia, unspecified (R13.10)    Aspiration Risk  Mild aspiration risk    Diet Recommendation Regular;Thin liquid   Liquid Administration via: Cup;Straw Medication Administration: Whole meds with liquid Supervision: Zachary Thornton able to self feed Compensations: Minimize environmental distractions;Slow rate;Small sips/bites Postural Changes: Remain upright for at least 30 minutes after po intake;Seated upright at 90 degrees    Other  Recommendations Oral Care Recommendations: Oral care BID   Follow up Recommendations None      Frequency and Duration Other (Comment) (eval only)          Prognosis Prognosis for Safe Diet Advancement: Good      Swallow Study   General Date of Onset: 03/13/21 HPI: Zachary Thornton is a Zachary Thornton with past medical history of severe TBI (2012) followed by Dr. Everlena Cooper, chronic encephalopathy, seizure disorder, VP shunt who  presented to ED after episode of vomiting; CT chest abdomen pelvis showed groundglass opacity in right middle lobe consistent with infection and sequela of aspiration. Fever 100.5. Type of Study: Bedside Swallow  Evaluation Previous Swallow Assessment: none on file, pt had remote history of dysphagia post TBI however per caregiver has been consuming regular diet/thin liquids without issue for 5+ years Diet Prior to this Study: NPO Temperature Spikes Noted: Yes (100.5) Respiratory Status: Room air History of Recent Intubation: No Behavior/Cognition: Alert;Cooperative;Pleasant mood;Confused Oral Cavity Assessment: Within Functional Limits Oral Care Completed by SLP: No Oral Cavity - Dentition: Adequate natural dentition Vision: Functional for self-feeding Self-Feeding Abilities: Able to feed self Zachary Thornton Positioning: Upright in bed Baseline Vocal Quality: Normal Volitional Cough: Strong Volitional Swallow: Able to elicit    Oral/Motor/Sensory Function Overall Oral Motor/Sensory Function: Within functional limits   Ice Chips Ice chips: Within functional limits Presentation: Spoon   Thin Liquid Thin Liquid: Within functional limits Presentation: Cup;Straw;Self Fed    Nectar Thick Nectar Thick Liquid: Not tested   Honey Thick Honey Thick Liquid: Not tested   Puree Puree: Within functional limits Presentation: Self Fed;Spoon   Solid     Solid: Within functional limits Presentation: Self Fed     Rondel Baton, MS, CCC-SLP Speech-Language Pathologist  Arlana Lindau 03/14/2021,4:48 PM

## 2021-03-14 NOTE — ED Notes (Signed)
Patient assisted to the bedroom toilet, attempted to provide urine specimen, unsuccessful.

## 2021-03-14 NOTE — Progress Notes (Signed)
Sepsis tracking by eLINK 

## 2021-03-14 NOTE — ED Notes (Signed)
Provider at bedside. Hemoccult stool negative.

## 2021-03-14 NOTE — ED Notes (Signed)
Pt assisted with meal.

## 2021-03-14 NOTE — ED Notes (Signed)
Transport requested

## 2021-03-14 NOTE — H&P (Signed)
History and Physical    Zachary Thornton AOZ:[id number] DOB: 01/16/1966 DOA: 03/13/2021  PCP: Rodrigo RanPerini, Mark, MD   Patient coming from: Home  I have personally briefly reviewed patient's old medical records in [facility]  Chief Complaint: Malaise, vomiting x1  HPI: Zachary Thornton is a 55 y.o. male with medical history significant for , Chronic encephalopathy, intraventricular shunt, seizure disorder bleeding duodenal ulcer in 2016 requiring 9 units PRBC and coil embolization, who presents to the emergency room following an episode of vomiting preceded by malaise.  Vomitus was nonbloody nonbilious and without coffee grounds.  Patient was previously in his usual state of health.  He denied abdominal pain, fever or chills.  Denied cough or shortness of breath.  Caregiver was unsure whether he had black or bloody stool.  History taken from ER provider and records. ED course: Upon arrival pulse of 122 respirations 28 and O2 sat 93% on room air.  BP initially 108/75 falling to 87/62.  Initially afebrile but spiking a temp of 100.5 after couple hours in the ER.  Blood work significant for normal CBC but with lactic acid 3.1 > 2.4.  Troponin was negative.  Lipase normal.  Urinalysis unremarkable.  CMP significant only for sodium of 127. EKG as interpreted by me sinus rhythm with a rate of 98 with no acute ST-T wave changes Imaging: CT head no acute changes and unchanged from prior CT chest abdomen and pelvis with contrast: patchy areas of groundglass opacity predominantly right middle lobe consistent with infection sequela of aspiration.  No acute duodenal abnormality seen among other findings  Patient was given sepsis fluid bolus and started on broad-spectrum antibiotics for possible septic shock.  Blood pressure was fluid responsive.  Hospitalist consulted for admission.  Review of Systems: Unobtainable due to history of TBI   Past Medical History:  Diagnosis Date  . Back injury   . Blood loss  anemia   . Dementia with behavioral disturbance (HCC)   . Duodenal ulcer hemorrhage   . GI bleed   . Seizures (HCC)   . TBI (traumatic brain injury) [facility](HCC)     Past Surgical History:  Procedure Laterality Date  . CRANIOTOMY     Post TBI craniotomy and plate insertion  . CSF SHUNT    . ESOPHAGOGASTRODUODENOSCOPY N/A 11/01/2015   Procedure: ESOPHAGOGASTRODUODENOSCOPY (EGD);  Surgeon: Ruffin FrederickSteven Paul Armbruster, MD;  [facility];  Service: Gastroenterology;  Laterality: N/A;  . ESOPHAGOGASTRODUODENOSCOPY (EGD) WITH PROPOFOL N/A 10/30/2015   Procedure: ESOPHAGOGASTRODUODENOSCOPY (EGD) WITH PROPOFOL;  Surgeon: Rachael Feeaniel P Jacobs, MD;  Location: [facility];  Service: Endoscopy;  Laterality: N/A;     reports that he has quit smoking. He has never used smokeless tobacco. He reports that he does not drink alcohol and does not use drugs.  Allergies  Allergen Reactions  . Nuedexta [Dextromethorphan-Quinidine] Anaphylaxis    Lips began to swell  . Nsaids Other (See Comments)    Due to GI bleeding    Family History  Problem Relation Age of Onset  . Heart disease Father        MVR  . Hypertension Mother   . Stroke Maternal Aunt       Prior to Admission medications   Medication Sig Start Date End Date Taking? Authorizing Provider  atorvastatin (LIPITOR) 20 MG tablet Take 20 mg by mouth daily.    [provider]  carbamazepine (CARBATROL) 300 MG 12 hr capsule TAKE 1 CAPSULE IN THE MORNING AND 2 CAPSULES IN  THE EVENING. 02/07/21   Drema Dallas, DO  carbamazepine (TEGRETOL-XR) 200 MG 12 hr tablet Take 3 tablets (600 mg total) by mouth 2 (two) times daily. 06/05/18   Drema Dallas, DO  cholecalciferol (VITAMIN D) 1000 UNITS tablet Take 1,000 Units by mouth daily.    [provider]  Cyanocobalamin (HM SUPER VITAMIN B12 PO) Take 1 tablet by mouth daily.     [provider]  Docusate Calcium (STOOL SOFTENER PO) Take 2 tablets by mouth daily.    [provider]  escitalopram (LEXAPRO) 20 MG tablet Take 20 mg by mouth daily.    [provider]  lamoTRIgine (LAMICTAL) 100 MG tablet TAKE (2) TABLETS TWICE DAILY. 02/07/21   Drema Dallas, DO  Multiple Vitamin (MULTIVITAMIN WITH MINERALS) TABS Take 1 tablet by mouth daily.    [provider]  pantoprazole (PROTONIX) 40 MG tablet TAKE 1 TABLET TWICE DAILY. 12/17/16   Rachael Fee, MD  tamsulosin [facility]) 0.4 MG CAPS capsule Take 1 capsule (0.4 mg total) by mouth daily after breakfast. 11/07/15   Rhetta Mura, MD    Physical Exam: Vitals:   03/14/21 0238 03/14/21 0245 03/14/21 0325 03/14/21 0326  BP: (!) 95/59 101/63 109/69   Pulse: 98 99 100 100  Resp: 18 14 (!) 21 20  Temp:      TempSrc:      SpO2: 93% 96% 98% 95%  Weight:      Height:         Vitals:   03/14/21 0238 03/14/21 0245 03/14/21 0325 03/14/21 0326  BP: (!) 95/59 101/63 109/69   Pulse: 98 99 100 100  Resp: 18 14 (!) 21 20  Temp:      TempSrc:      SpO2: 93% 96% 98% 95%  Weight:      Height:          Constitutional: Alert, pleasant and assessment of orientation limited by TBI. Answers unrelated to questions asked. Not in any apparent distress HEENT:      Head: Normocephalic and atraumatic.         Eyes: PERLA, EOMI, Conjunctivae are normal. Sclera is non-icteric.       Mouth/Throat: Mucous membranes are moist.       Neck: Supple with no signs of meningismus. Cardiovascular: Regular rate and rhythm. No murmurs, gallops, or rubs. 2+ symmetrical distal pulses are present . No JVD. No LE edema Respiratory: Respiratory effort normal .Lungs sounds diminished bilaterally. No wheezes, crackles, or rhonchi.  Gastrointestinal: Soft, non tender, and non distended with positive bowel sounds.  Genitourinary: No CVA tenderness. Musculoskeletal: Nontender with normal range of motion in all extremities. No cyanosis, or erythema of extremities. Neurologic:  Face is symmetric. Moving all  extremities. No gross focal neurologic deficits . Skin: Skin is warm, dry.  No rash or ulcers Psychiatric: Mood and affect are normal    Labs on Admission: I have personally reviewed following labs and imaging studies  CBC: Recent Labs  Lab 03/13/21 2022  WBC 9.9  10.4  NEUTROABS 9.3*  HGB 14.5  14.6  HCT 42.6  42.3  MCV 94.5  92.8  PLT 244  221   Basic Metabolic Panel: Recent Labs  Lab 03/13/21 2022  NA 127*  K 4.0  CL 92*  CO2 23  GLUCOSE 158*  BUN 16  CREATININE 0.98  CALCIUM 9.0   GFR: Estimated Creatinine Clearance: 83.4 mL/min (by C-G formula based on SCr of 0.98 mg/dL).  Liver Function Tests: Recent Labs  Lab 03/13/21 2022  AST 27  ALT 21  ALKPHOS 92  BILITOT 0.8  PROT 7.4  ALBUMIN 4.4   Recent Labs  Lab 03/13/21 2022  LIPASE 28   No results for input(s): AMMONIA in the last 168 hours. Coagulation Profile: Recent Labs  Lab 03/13/21 2349  INR 1.0   Cardiac Enzymes: No results for input(s): CKTOTAL, CKMB, CKMBINDEX, TROPONINI in the last 168 hours. BNP (last 3 results) No results for input(s): PROBNP in the last 8760 hours. HbA1C: No results for input(s): HGBA1C in the last 72 hours. CBG: No results for input(s): GLUCAP in the last 168 hours. Lipid Profile: No results for input(s): CHOL, HDL, LDLCALC, TRIG, CHOLHDL, LDLDIRECT in the last 72 hours. Thyroid Function Tests: No results for input(s): TSH, T4TOTAL, FREET4, T3FREE, THYROIDAB in the last 72 hours. Anemia Panel: No results for input(s): VITAMINB12, FOLATE, FERRITIN, TIBC, IRON, RETICCTPCT in the last 72 hours. Urine analysis:    Component Value Date/Time   COLORURINE YELLOW (A) 03/14/2021 0215   APPEARANCEUR CLEAR (A) 03/14/2021 0215   LABSPEC 1.017 03/14/2021 0215   PHURINE 6.0 03/14/2021 0215   GLUCOSEU NEGATIVE 03/14/2021 0215   HGBUR NEGATIVE 03/14/2021 0215   BILIRUBINUR NEGATIVE 03/14/2021 0215   KETONESUR NEGATIVE 03/14/2021 0215   PROTEINUR NEGATIVE 03/14/2021  0215   UROBILINOGEN 0.2 10/29/2015 2230   NITRITE NEGATIVE 03/14/2021 0215   LEUKOCYTESUR NEGATIVE 03/14/2021 0215    Radiological Exams on Admission: CT Head Wo Contrast  Result Date: 03/14/2021 CLINICAL DATA:  Shunt follow-up EXAM: CT HEAD WITHOUT CONTRAST TECHNIQUE: Contiguous axial images were obtained from the base of the skull through the vertex without intravenous contrast. COMPARISON:  03/23/2017 FINDINGS: Brain: There is a left frontal approach shunt catheter with tip in the frontal horn of the left lateral ventricle. Unchanged appearance of dural hyperdensity underlying the right-sided craniotomy. Bifrontal and bitemporal encephalomalacia and bilateral cerebellar encephalomalacia is unchanged. No acute hemorrhage. Size and configuration of the ventricles are unchanged. Vascular: No hyperdense vessel or unexpected calcification. Skull: Remote right pterional craniectomy with cranioplasty and right suboccipital craniectomy. Sinuses/Orbits: No acute finding. Other: None IMPRESSION: 1. Unchanged size and configuration of the shunted ventricles. 2. Unchanged multifocal encephalomalacia in a pattern consistent with remote trauma. Electronically Signed   By: Deatra Robinson M.D.   On: 03/14/2021 03:08   CT CHEST ABDOMEN PELVIS W CONTRAST  Result Date: 03/14/2021 CLINICAL DATA:  History of TBI, altered behavior per caregiver, emesis today at 1400 hours, history of bleeding ulcers EXAM: CT CHEST, ABDOMEN, AND PELVIS WITH CONTRAST TECHNIQUE: Multidetector CT imaging of the chest, abdomen and pelvis was performed following the standard protocol during bolus administration of intravenous contrast. CONTRAST:  OMNIPAQUE IOHEXOL 300 MG/ML  SOLN COMPARISON:  Angiography 11/01/2015 FINDINGS: CT CHEST FINDINGS Cardiovascular: Normal heart size. No pericardial effusion. The aortic root is suboptimally assessed given cardiac pulsation artifact. The aorta is normal caliber. No acute luminal abnormality of the  imaged aorta. No periaortic stranding or hemorrhage. Normal 3 vessel branching of the aortic arch. Proximal great vessels are unremarkable. Central pulmonary arteries are normal caliber. No large central or lobar filling defects with more distal evaluation limited on this non tailored examination of the pulmonary arteries. Mediastinum/Nodes: No mediastinal fluid or gas. Normal thyroid gland and thoracic inlet. No acute abnormality of the trachea or esophagus. Few scattered subcentimeter mediastinal and hilar nodes are likely reactive. No worrisome mediastinal, hilar or axillary adenopathy. Lungs/Pleura: Mild airways thickening and  scattered secretions. Some patchy areas of ground-glass opacity are present predominantly in the right middle and both lower lobes including more focal consolidative density in the medial aspect of the right middle lobe. Some more minimal areas of patchy ground-glass are seen in the right upper lobe as well. No pneumothorax. No effusion. Background of paraseptal predominant emphysematous changes. Musculoskeletal: Ventriculoperitoneal catheter tubing courses from the base of the left neck, crossing midline and extending along the right chest wall without visible discontinuity. No chest wall mass or suspicious bone lesions identified. CT ABDOMEN PELVIS FINDINGS Hepatobiliary: 12 mm fluid attenuation cyst seen in the posterior right lobe liver (2/52). No other focal liver lesion. Smooth surface contour. Hepatic attenuation remains within normal limits. No biliary ductal dilatation. No visible calcified gallstones. Gallbladder is unremarkable without pericholecystic fluid or inflammation. Pancreas: No pancreatic ductal dilatation or surrounding inflammatory changes. Spleen: Normal in size. No concerning splenic lesions. Adrenals/Urinary Tract: Normal adrenal glands. Kidneys are normally located with symmetric enhancementand excretion. Few subcentimeter hypoattenuating foci in the kidneys too  small to fully characterize on CT imaging but statistically likely benign. No suspicious renal lesion, urolithiasis or hydronephrosis. Mild symmetric bilateral perinephric stranding, a nonspecific finding. Stomach/Bowel: Distal esophagus, stomach are unremarkable. Multiple vascular coil packs are seen towards the first portion of the duodenum compatible with prior percutaneous coil embolization seen on comparison angiography. No acute abnormality of the duodenal sweep. No small bowel thickening or dilatation is seen. Appendix coils about the cecal tip without acute periappendiceal inflammation to suggest acute appendicitis. No colonic dilatation or wall thickening. No bowel obstruction. Vascular/Lymphatic: No significant vascular findings are present. No enlarged abdominal or pelvic lymph nodes. Reproductive: Benign-appearing coarse eccentric calcification of the prostate. No concerning abnormalities of the prostate or seminal vesicles. Small calcifications noted at the level of the glans. Included external genitalia is otherwise unremarkable. Other: Trace low-attenuation free fluid in the deep pelvis, nonspecific given the presence of a VP shunt catheter which is seen entering the right anterior abdominal wall coiling in the abdomen and terminating in the low midline. Small amount of fluid within a tiny umbilical fat containing hernia. No bowel containing hernias. Fat containing left inguinal hernia as well. Musculoskeletal: No acute osseous abnormality or suspicious osseous lesion. Sclerotic changes with subcortical cystic features and vacuum disc phenomenon centered at the L3-4 level, favored to be degenerative. IMPRESSION: 1. Some patchy areas of ground-glass opacity predominantly in the right middle and both lower lobes including more focal consolidative density in the medial aspect of the right middle lobe. Some more minimal areas of patchy ground-glass are seen in the right upper lobe as well. Findings are  could be seen as sequela of aspiration or atypical infection, including viral etiologies. 2. Trace low-attenuation free fluid in the deep pelvis, nonspecific given the presence of a VP shunt catheter. 3. Multiple vascular coil packs towards the first portion of the duodenum compatible with prior percutaneous coil embolization seen on comparison angiography. No acute duodenal abnormality at this time. 4. Mild symmetric bilateral perinephric stranding, a nonspecific finding. Can be an incidental finding seen with advanced age or diminished renal function though recommend correlation with urinalysis to exclude urinary tract infection. 5. Emphysema (ICD10-J43.9). Electronically Signed   By: Kreg Shropshire M.D.   On: 03/14/2021 00:57   DG Chest Port 1 View  Result Date: 03/14/2021 CLINICAL DATA:  Sepsis EXAM: PORTABLE CHEST 1 VIEW COMPARISON:  None. FINDINGS: The heart size and mediastinal contours are within normal limits. Hazy airspace  opacity is seen at the right lung base. The left lung is clear. No pleural effusion is seen. No acute osseous abnormality. IMPRESSION: Hazy airspace opacity at the right lung base which may be due to atelectasis and/or infectious etiology. Electronically Signed   By: Jonna Clark M.D.   On: 03/14/2021 00:22     Assessment/Plan 55 year old male with history of chronic encephalopathy, intraventricular shunt, seizure disorder, bleeding duodenal ulcer in 2016 requiring 9 units PRBC and coil embolization, presenting following an episode of vomiting preceded by malaise.      Severe sepsis (HCC)   Pneumonia, possible aspiration -Sepsis criteria includes fever, tachycardia, hypotension elevated lactic acid and source of infection -CT chest abdomen and pelvis with contrast significant for pneumonia without other acute findings -CT head showing stable findings -BP was fluid responsive -Continue IV fluid resuscitation -Continue Rocephin and azithromycin    Traumatic brain injury  with depressed frontal skull fracture (HCC)   Cognitive deficit as late effect of traumatic brain injury (HCC)   Interventricular shunt -CT head with stable findings -No acute disease suspected at this time -Increase nursing care.  Patient has round-the-clock caregivers at home  Seizure disorder -Continue antiepileptic drugs    History of duodenal ulcer with hemorrhage -CT abdomen and pelvis with no acute duodenal abnormality -Hemoglobin robust.  Continue to monitor -History of hemorrhagic shock 2016 from bleeding duodenal ulcer requiring 9 units blood -PPI    DVT prophylaxis: Lovenox  Code Status: full code  Family Communication:  none  Disposition Plan: Back to previous home environment Consults called: none  Status:At the time of admission, it appears that the appropriate admission status for this patient is INPATIENT. This is judged to be reasonable and necessary in order to provide the required intensity of service to ensure the patient's safety given the presenting symptoms, physical exam findings, and initial radiographic and laboratory data in the context of their  Comorbid conditions.   Patient requires inpatient status due to high intensity of service, high risk for further deterioration and high frequency of surveillance required.   I certify that at the point of admission it is my clinical judgment that the patient will require inpatient hospital care spanning beyond 2 midnights     Andris Baumann MD [facility]     03/14/2021, 4:12 AM

## 2021-03-15 LAB — URINE CULTURE: Culture: <10000 — AB

## 2021-03-15 LAB — CBC
HCT: 33.3 % — ABNORMAL LOW (ref 39.0–52.0)
Hemoglobin: 11.6 g/dL — ABNORMAL LOW (ref 13.0–17.0)
MCH: 32.3 pg (ref 26.0–34.0)
MCHC: 34.8 g/dL (ref 30.0–36.0)
MCV: 92.8 fL (ref 80.0–100.0)
Platelets: 183 10*3/uL (ref 150–400)
RBC: 3.59 MIL/uL — ABNORMAL LOW (ref 4.22–5.81)
RDW: 12 % (ref 11.5–15.5)
WBC: 9 10*3/uL (ref 4.0–10.5)
nRBC: 0 % (ref 0.0–0.2)

## 2021-03-15 LAB — BASIC METABOLIC PANEL
Anion gap: 8 (ref 5–15)
BUN: 7 mg/dL (ref 6–20)
CO2: 26 mmol/L (ref 22–32)
Calcium: 8.5 mg/dL — ABNORMAL LOW (ref 8.9–10.3)
Chloride: 101 mmol/L (ref 98–111)
Creatinine, Ser: 0.7 mg/dL (ref 0.61–1.24)
GFR, Estimated: >60 mL/min (ref >60)
Glucose, Bld: 110 mg/dL — ABNORMAL HIGH (ref 70–99)
Potassium: 3.6 mmol/L (ref 3.5–5.1)
Sodium: 135 mmol/L (ref 135–145)

## 2021-03-15 LAB — IRON AND TIBC
Iron: 9 ug/dL — ABNORMAL LOW (ref 45–182)
Saturation Ratios: 4 % — ABNORMAL LOW (ref 17.9–39.5)
TIBC: 232 ug/dL — ABNORMAL LOW (ref 250–450)
UIBC: 223 ug/dL

## 2021-03-15 LAB — RETICULOCYTES
Immature Retic Fract: 13.8 % (ref 2.3–15.9)
RBC.: 3.52 MIL/uL — ABNORMAL LOW (ref 4.22–5.81)
Retic Count, Absolute: 65.8 10*3/uL (ref 19.0–186.0)
Retic Ct Pct: 1.9 % (ref 0.4–3.1)

## 2021-03-15 LAB — FOLATE: Folate: 16.7 ng/mL (ref >5.9)

## 2021-03-15 LAB — VITAMIN B12: Vitamin B-12: 260 pg/mL (ref 180–914)

## 2021-03-15 LAB — FERRITIN: Ferritin: 123 ng/mL (ref 24–336)

## 2021-03-15 MED ORDER — VITAMIN B-12 100 MCG PO TABS
100.0000 ug | ORAL_TABLET | Freq: Every day | ORAL | Status: DC
  Administered 2021-03-15 – 2021-03-16 (×2): 100 ug via ORAL
  Filled 2021-03-15 (×2): qty 1

## 2021-03-15 MED ORDER — CARBAMAZEPINE ER 200 MG PO TB12
300.0000 mg | ORAL_TABLET | Freq: Every morning | ORAL | Status: DC
  Administered 2021-03-15 – 2021-03-16 (×2): 300 mg via ORAL
  Filled 2021-03-15 (×2): qty 1

## 2021-03-15 MED ORDER — FERROUS SULFATE 325 (65 FE) MG PO TABS
325.0000 mg | ORAL_TABLET | Freq: Every day | ORAL | Status: DC
  Administered 2021-03-16: 09:00:00 325 mg via ORAL
  Filled 2021-03-15: qty 1

## 2021-03-15 MED ORDER — CARBAMAZEPINE ER 200 MG PO TB12
600.0000 mg | ORAL_TABLET | Freq: Every day | ORAL | Status: DC
  Administered 2021-03-15: 600 mg via ORAL
  Filled 2021-03-15 (×2): qty 3

## 2021-03-15 NOTE — Evaluation (Addendum)
Occupational Therapy Evaluation Patient Details Name: Zachary Thornton MRN: [id number] DOB: 05/23/1966 Today's Date: 03/15/2021    History of Present Illness 55 y.o. male with medical history significant for , Chronic encephalopathy, intraventricular shunt, seizure disorder bleeding duodenal ulcer in 2016 requiring 9 units PRBC and coil embolization, who presents to the emergency room following an episode of vomiting preceded by malaise.  Vomitus was nonbloody nonbilious and without coffee grounds.  Patient was previously in his usual state of health.  He denied abdominal pain, fever or chills.  Denied cough or shortness of breath.  Caregiver was unsure whether he had black or bloody stool.  History taken from ER provider and records.   Clinical Impression   Patient presenting with decreased I in self care, balance, functional mobility/transfers, endurance, and safety awareness.  Patient reports living with roommate PTA. Pt does not ambulate with use of AD and reports independence in self care tasks. [name] and sister living in homes next door. His mom checks in daily and takes him to appointments. Pt reports liking playing drums and is very pleasant throughout. Pt did have some difficulty just a few times during evaluation with word finding but when given choice of 2 he verbalized correctly.  Patient currently functioning at [facility] overall for self care tasks in standing. Pt ambulating 200' while holding onto IV pole with min guard. Pt reports feeling close to baseline. Patient will benefit from acute OT to increase overall independence in the areas of ADLs, functional mobility, and safety awareness in order to safely discharge to home.    Follow Up Recommendations  No OT follow up;Supervision - Intermittent    Equipment Recommendations  None recommended by OT       Precautions / Restrictions Precautions Precautions: Fall      Mobility Bed Mobility Overal bed mobility: Needs Assistance Bed  Mobility: Supine to Sit;Sit to Supine     Supine to sit: Supervision Sit to supine: Supervision   General bed mobility comments: min cuing for technique    Transfers Overall transfer level: Needs assistance Equipment used: None Transfers: Sit to/from [employer] Sit to Stand: Min guard Stand pivot transfers: Min guard            Balance Overall balance assessment: Needs assistance Sitting-balance support: Feet supported Sitting balance-Leahy Scale: Good     Standing balance support: During functional activity Standing balance-Leahy Scale: Fair                             ADL either performed or assessed with clinical judgement   ADL Overall ADL's : Needs assistance/impaired Eating/Feeding: Independent   Grooming: Wash/dry hands;Wash/dry face;Oral care;Standing;Min guard                   Toilet Transfer: Min guard;Ambulation Toilet Transfer Details (indicate cue type and reason): simulated         Functional mobility during ADLs: Min guard;Rolling walker       Vision Patient Visual Report: No change from baseline              Pertinent Vitals/Pain Pain Assessment: No/denies pain     Hand Dominance Right   Extremity/Trunk Assessment Upper Extremity Assessment Upper Extremity Assessment: Overall WFL for tasks assessed   Lower Extremity Assessment Lower Extremity Assessment: Overall WFL for tasks assessed       Communication Communication Communication: Expressive difficulties   Cognition Arousal/Alertness: Awake/alert Behavior During  Therapy: WFL for tasks assessed/performed Overall Cognitive Status: No family/caregiver present to determine baseline cognitive functioning                                 General Comments: Very pleasant and cooperative. Few instances of word finding difficulties and when given choice of two pt answers correctly. Follows commands with increased time.               Home Living Family/patient expects to be discharged to:: Private residence Living Arrangements: Non-relatives/Friends Available Help at Discharge: Family;Available PRN/intermittently Type of Home: House Home Access: Stairs to enter [employer] of Steps: 10 Entrance Stairs-Rails: Right;Left;Can reach both Home Layout: One level     Bathroom Shower/Tub: Tub/shower unit                    Prior Functioning/Environment Level of Independence: Independent                 OT Problem List: Decreased strength;Impaired balance (sitting and/or standing);Decreased activity tolerance;Decreased safety awareness      OT Treatment/Interventions: Self-care/ADL training;Manual therapy;Therapeutic exercise;Patient/family education;Modalities;Balance training;DME and/or AE instruction;Therapeutic activities;Energy conservation    OT Goals(Current goals can be found in the care plan section) Acute Rehab OT Goals Patient Stated Goal: to go home OT Goal Formulation: With patient Time For Goal Achievement: 03/29/21 Potential to Achieve Goals: Good ADL Goals Pt Will Perform Lower Body Dressing: with modified independence;sit to/from stand Pt Will Transfer to Toilet: with modified independence;ambulating Pt Will Perform Toileting - Clothing Manipulation and hygiene: with modified independence;sit to/from stand  OT Frequency: Min 2X/week   Barriers to D/C:    none known at this time          AM-PAC OT "6 Clicks" Daily Activity     Outcome Measure Help from another person eating meals?: None Help from another person taking care of personal grooming?: A Little Help from another person toileting, which includes using toliet, bedpan, or urinal?: A Little Help from another person bathing (including washing, rinsing, drying)?: A Little Help from another person to put on and taking off regular upper body clothing?: None Help from another person to put on and taking off  regular lower body clothing?: A Little 6 Click Score: 20   End of Session Nurse Communication: Mobility status;Precautions  Activity Tolerance: Patient tolerated treatment well Patient left: in bed;with call bell/phone within reach;with bed alarm set  OT Visit Diagnosis: Unsteadiness on feet (R26.81);Repeated falls (R29.6);Muscle weakness (generalized) (M62.81)                Time: 1517-6160 OT Time Calculation (min): 31 min Charges:  OT General Charges $OT Visit: 1 Visit OT Evaluation $OT Eval Moderate Complexity: 1 Mod OT Treatments $Self Care/Home Management : 8-22 mins  Jackquline Denmark, MS, OTR/L , CBIS ascom [phone]  03/15/21, 1:20 PM

## 2021-03-15 NOTE — Progress Notes (Signed)
PT Cancellation Note  Patient Details Name: DIMA FERRUFINO MRN: [id number] DOB: 04-Nov-1966   Cancelled Treatment:    Reason Eval/Treat Not Completed: Patient declined, no reason specified.  Pt states he was just up with OT and is too tired to participate with therapy at this time.  Pt agreed to therapist coming back this afternoon to evaluation pt as medically appropriate.    Nolon Bussing, PT, DPT 03/15/21, 11:12 AM

## 2021-03-15 NOTE — Progress Notes (Signed)
PROGRESS NOTE    Zachary Thornton  VQM:[id number] DOB: [birth year] DOA: 03/13/2021 PCP: [name], MD   Brief Narrative: 55 year old with past medical history significant for chronic encephalopathy, intravascular shunt, seizure disorder, bleeding duodenal ulcer in 2006 requiring 9 units of packed red blood cell and coil embolization who presents to the emergency room following an episode of vomiting preceded by malaise.  Vomitus was nonbloody or coffee-ground.  Patient was previously in his usual state of health.  He denies abdominal pain, fevers or chills.  Denies cough or shortness of breath.  Caregiver was unsure whether he had black or bloody stool.  ED course on arrival he was tachycardic heart rate 122, tachypnea respiration rate 28 oxygen saturation 93 on room air.  Blood pressure 108/75 subsequently decreased to 87/62.  Spiking fever 100.5.  Lactic acid 3.1, troponin negative.  Lipase normal, UA unremarkable.  Sodium 127.  EKG no acute ST-T wave changes.  CT head no changes from before.  CT chest abdomen and pelvis with contrast showed patchy area of groundglass opacity predominantly right middle lobe consistent with infection and sequela of aspiration.  No acute duodenal abnormality.      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 -CT chest abdomen and pelvis with contrast significant for pneumonia without other acute finding. -Continue with IV fluids -Continue with IV ceftriaxone and azithromycin -He pass swallow evaluation.  -if vital remain stable plan to discharge home tomorrow.   2-Traumatic  brain injury with depressed frontal skull fracture, cognitive deficit as late effect of traumatic brain injury, interventricular shunt: -CT Head stable finding -Supportive care -PT OT eval, HH OT   3-Seizure Disorder:  -Continue with carbamazepine and Lamictal  4-History of duodenal ulcer with hemorrhage. CT abdomen  and pelvis with not acute duodenal  abnormality -Continue with PPI.  5-B-12 low normal; range start supplement.  6-Iron deficiency anemia; start Iron supplement. He will need screening colonoscopy out patient.  7-Elevated BP; will monitor might need medication if continue to be elevated.  8-Hyponatremia; resolved with IV fluids.   Estimated body mass index is 25.85 kg/m as calculated from the following:   Height as of this encounter: 5\' 8"  (1.727 m).   Weight as of this encounter: 77.1 kg.   DVT prophylaxis: Lovenox Code Status: Full code Family Communication: Mother updated at bedside. 3-24/2022 Disposition Plan:  Status is: Inpatient  Remains inpatient appropriate because:IV treatments appropriate due to intensity of illness or inability to take PO   Dispo: The patient is from: Home              Anticipated d/c is to: Home              Patient currently is not medically stable to d/c.   Difficult to place patient No        Consultants:   none  Procedures:   none  Antimicrobials:  Ceftriaxone and Azithromycin.   Subjective: He is feeling better, no cough, tolerating diet  Objective: Vitals:   03/15/21 0413 03/15/21 0734 03/15/21 1228 03/15/21 1601  BP: (!) 141/88 (!) 142/91 (!) 141/82 (!) 150/94  Pulse: 91  89 85 91  Resp: 18 18 16 18   Temp: 98.9 F (37.2 C) 97.9 F (36.6 C) 98.8 F (37.1 C) 97.8 F (36.6 C)  TempSrc: Oral  Oral   SpO2: 96% 97% 95% 97%  Weight:      Height:        Intake/Output Summary (Last 24 hours) at 03/15/2021 1633 Last data filed at 03/15/2021 0959 Gross per 24 hour  Intake 2301.3 ml  Output 1600 ml  Net  701.3 ml   Filed Weights   03/13/21 2009  Weight: 77.1 kg    Examination:  General exam: NAD Respiratory system: CTA Cardiovascular system: S 1, S 2 RRR. Gastrointestinal system: BS present, soft, nt Central nervous system: Alert Extremities: Symmetric power    Data Reviewed: I have personally reviewed following labs and imaging studies  CBC: Recent Labs  Lab 03/13/21 2022 03/14/21 0445 03/15/21 0738  WBC 9.9  10.4 12.0* 9.0  NEUTROABS 9.3*  --   --   HGB 14.5  14.6 11.4* 11.6*  HCT 42.6  42.3 32.9* 33.3*  MCV 94.5  92.8 93.5 92.8  PLT 244  221 166 183   Basic Metabolic Panel: Recent Labs  Lab 03/13/21 2022 03/14/21 0445 03/14/21 0906 03/15/21 0738  NA 127*  --  131* 135  K 4.0  --  4.1 3.6  CL 92*  --  99 101  CO2 23  --  25 26  GLUCOSE 158*  --  108* 110*  BUN 16  --  13 7  CREATININE 0.98 0.95 0.89 0.70  CALCIUM 9.0  --  8.1* 8.5*   GFR: Estimated Creatinine Clearance: 102.1 mL/min (by C-G formula based on SCr of 0.7 mg/dL). Liver Function Tests: Recent Labs  Lab 03/13/21 2022  AST 27  ALT 21  ALKPHOS 92  BILITOT 0.8  PROT 7.4  ALBUMIN 4.4   Recent Labs  Lab 03/13/21 2022  LIPASE 28   No results for input(s): AMMONIA in the last 168 hours. Coagulation Profile: Recent Labs  Lab 03/13/21 2349  INR 1.0   Cardiac Enzymes: No results for input(s): CKTOTAL, CKMB, CKMBINDEX, TROPONINI in the last 168 hours. BNP (last 3 results) No results for input(s): PROBNP in the last 8760 hours. HbA1C: No results for input(s): HGBA1C in the last 72 hours. CBG: No results for input(s): GLUCAP in the last 168 hours. Lipid Profile: No results for input(s): CHOL, HDL, LDLCALC, TRIG, CHOLHDL, LDLDIRECT in the last 72 hours. Thyroid Function Tests: No results for input(s): TSH, T4TOTAL, FREET4, T3FREE, THYROIDAB in the last 72 hours. Anemia Panel: Recent Labs    03/15/21 0738  VITAMINB12 260  FOLATE 16.7  FERRITIN 123  TIBC 232*  IRON 9*   RETICCTPCT 1.9   Sepsis Labs: Recent Labs  Lab 03/13/21 2349 03/14/21 0139 03/14/21 0445  PROCALCITON  --   --  2.45  LATICACIDVEN 3.1* 2.4*  --     Recent Results (from the past 240 hour(s))  Blood Culture (routine x 2)     Status: None (Preliminary result)   Collection Time: 03/13/21 11:49 PM   Specimen: BLOOD  Result Value Ref Range Status   Specimen Description BLOOD RIGHT ANTECUBITAL  Final   Special Requests   Final    BOTTLES DRAWN AEROBIC AND ANAEROBIC Blood Culture adequate volume   Culture   Final    NO GROWTH 1 DAY Performed at [facility], [address] [place], [place]    Report Status PENDING  Incomplete  Blood Culture (routine x 2)     Status: None (Preliminary result)   Collection Time: 03/14/21 12:10 AM   Specimen: BLOOD  Result Value Ref Range Status   Specimen Description BLOOD BLOOD LEFT WRIST  Final   Special Requests   Final    BOTTLES DRAWN AEROBIC AND ANAEROBIC Blood Culture results may not be optimal due to an inadequate volume of blood received in culture bottles   Culture   Final    NO GROWTH 1 DAY Performed at [facility], [address] [place], [place] [address]    Report Status PENDING  Incomplete  Resp Panel by RT-PCR (Flu A&B, Covid) Nasopharyngeal Swab     Status: None   Collection Time: 03/14/21 12:11 AM   Specimen: Nasopharyngeal Swab; Nasopharyngeal(NP) swabs in vial transport medium  Result Value Ref Range Status   SARS Coronavirus 2 by RT PCR NEGATIVE NEGATIVE Final    Comment: (NOTE) SARS-CoV-2 target nucleic acids are NOT DETECTED.  The SARS-CoV-2 RNA is generally detectable in upper respiratory specimens during the acute phase of infection. The lowest concentration of SARS-CoV-2 viral copies this assay can detect is 138 copies/mL. A negative result does not preclude SARS-Cov-2 infection and should not be used as the sole basis for treatment or other patient management decisions. A negative  result may occur with  improper specimen collection/handling, submission of specimen other than nasopharyngeal swab, presence of viral mutation(s) within the areas targeted by this assay, and inadequate number of viral copies(<138 copies/mL). A negative result must be combined with clinical observations, patient history, and epidemiological information. The expected result is Negative.  Fact Sheet for Patients:  BloggerCourse.comhttps://www.fda.gov/media/152166/download  Fact Sheet for Healthcare Providers:  SeriousBroker.ithttps://www.fda.gov/media/152162/download  This test is no t yet approved or cleared by the [place] FDA and  has been authorized for detection and/or diagnosis of SARS-CoV-2 by FDA under an Emergency Use Authorization (EUA). This EUA will remain  in effect (meaning this test can be used) for the duration of the COVID-19 declaration under Section 564(b)(1) of the Act, 21 U.S.C.section 360bbb-3(b)(1), unless the authorization is terminated  or revoked sooner.       Influenza A by PCR NEGATIVE NEGATIVE Final   Influenza B by PCR NEGATIVE NEGATIVE Final    Comment: (NOTE) The Xpert Xpress SARS-CoV-2/FLU/RSV plus assay is intended as an aid in the diagnosis of influenza from Nasopharyngeal swab specimens and should not be used as a sole basis for treatment. Nasal washings and aspirates are unacceptable for Xpert Xpress SARS-CoV-2/FLU/RSV testing.  Fact Sheet for Patients: BloggerCourse.comhttps://www.fda.gov/media/152166/download  Fact Sheet for Healthcare Providers: SeriousBroker.ithttps://www.fda.gov/media/152162/download  This test is not yet approved or cleared by the [place] FDA and has been authorized for detection and/or diagnosis of SARS-CoV-2 by FDA under an Emergency Use Authorization (EUA). This EUA will remain in effect (meaning this test can be used) for the duration of the COVID-19 declaration under Section 564(b)(1) of the Act, 21 U.S.C. section 360bbb-3(b)(1), unless the authorization is  terminated or revoked.  Performed at [facility], [address] [place], [place] [address]   Urine culture     Status: Abnormal   Collection Time: 03/14/21  2:15 AM   Specimen: In/Out Cath Urine  Result Value Ref Range Status   Specimen Description   Final    IN/OUT CATH URINE Performed at [facility], [address] [place], [place] [address]    Special Requests   Final    NONE Performed  at [facility], [address], [place], [place] [address]    Culture (A)  Final    <10,000 COLONIES/mL INSIGNIFICANT GROWTH Performed at [facility], [address] [place], [place] [address]    Report Status 03/15/2021 FINAL  Final         Radiology Studies: CT Head Wo Contrast  Result Date: 03/14/2021 CLINICAL DATA:  Shunt follow-up EXAM: CT HEAD WITHOUT CONTRAST TECHNIQUE: Contiguous axial images were obtained from the base of the skull through the vertex without intravenous contrast. COMPARISON:  03/23/2017 FINDINGS: Brain: There is a left frontal approach shunt catheter with tip in the frontal horn of the left lateral ventricle. Unchanged appearance of dural hyperdensity underlying the right-sided craniotomy. Bifrontal and bitemporal encephalomalacia and bilateral cerebellar encephalomalacia is unchanged. No acute hemorrhage. Size and configuration of the ventricles are unchanged. Vascular: No hyperdense vessel or unexpected calcification. Skull: Remote right pterional craniectomy with cranioplasty and right suboccipital craniectomy. Sinuses/Orbits: No acute finding. Other: None IMPRESSION: 1. Unchanged size and configuration of the shunted ventricles. 2. Unchanged multifocal encephalomalacia in a pattern consistent with remote trauma. Electronically Signed   By: Deatra Robinson M.D.   On: 03/14/2021 03:08   CT CHEST ABDOMEN PELVIS W CONTRAST  Result Date: 03/14/2021 CLINICAL DATA:  History of TBI, altered behavior per caregiver, emesis today at  1400 hours, history of bleeding ulcers EXAM: CT CHEST, ABDOMEN, AND PELVIS WITH CONTRAST TECHNIQUE: Multidetector CT imaging of the chest, abdomen and pelvis was performed following the standard protocol during bolus administration of intravenous contrast. CONTRAST:  OMNIPAQUE IOHEXOL 300 MG/ML  SOLN COMPARISON:  Angiography 11/01/2015 FINDINGS: CT CHEST FINDINGS Cardiovascular: Normal heart size. No pericardial effusion. The aortic root is suboptimally assessed given cardiac pulsation artifact. The aorta is normal caliber. No acute luminal abnormality of the imaged aorta. No periaortic stranding or hemorrhage. Normal 3 vessel branching of the aortic arch. Proximal great vessels are unremarkable. Central pulmonary arteries are normal caliber. No large central or lobar filling defects with more distal evaluation limited on this non tailored examination of the pulmonary arteries. Mediastinum/Nodes: No mediastinal fluid or gas. Normal thyroid gland and thoracic inlet. No acute abnormality of the trachea or esophagus. Few scattered subcentimeter mediastinal and hilar nodes are likely reactive. No worrisome mediastinal, hilar or axillary adenopathy. Lungs/Pleura: Mild airways thickening and scattered secretions. Some patchy areas of ground-glass opacity are present predominantly in the right middle and both lower lobes including more focal consolidative density in the medial aspect of the right middle lobe. Some more minimal areas of patchy ground-glass are seen in the right upper lobe as well. No pneumothorax. No effusion. Background of paraseptal predominant emphysematous changes. Musculoskeletal: Ventriculoperitoneal catheter tubing courses from the base of the left neck, crossing midline and extending along the right chest wall without visible discontinuity. No chest wall mass or suspicious bone lesions identified. CT ABDOMEN PELVIS FINDINGS Hepatobiliary: 12 mm fluid attenuation cyst seen in the posterior  right lobe liver (2/52). No other focal liver lesion. Smooth surface contour. Hepatic attenuation remains within normal limits. No biliary ductal dilatation. No visible calcified gallstones. Gallbladder is unremarkable without pericholecystic fluid or inflammation. Pancreas: No pancreatic ductal dilatation or surrounding inflammatory changes. Spleen: Normal in size. No concerning splenic lesions. Adrenals/Urinary Tract: Normal adrenal glands. Kidneys are normally located with symmetric enhancementand excretion. Few subcentimeter hypoattenuating foci in the kidneys too small to fully characterize on CT imaging but statistically likely benign. No suspicious renal lesion, urolithiasis or hydronephrosis. Mild  symmetric bilateral perinephric stranding, a nonspecific finding. Stomach/Bowel: Distal esophagus, stomach are unremarkable. Multiple vascular coil packs are seen towards the first portion of the duodenum compatible with prior percutaneous coil embolization seen on comparison angiography. No acute abnormality of the duodenal sweep. No small bowel thickening or dilatation is seen. Appendix coils about the cecal tip without acute periappendiceal inflammation to suggest acute appendicitis. No colonic dilatation or wall thickening. No bowel obstruction. Vascular/Lymphatic: No significant vascular findings are present. No enlarged abdominal or pelvic lymph nodes. Reproductive: Benign-appearing coarse eccentric calcification of the prostate. No concerning abnormalities of the prostate or seminal vesicles. Small calcifications noted at the level of the glans. Included external genitalia is otherwise unremarkable. Other: Trace low-attenuation free fluid in the deep pelvis, nonspecific given the presence of a VP shunt catheter which is seen entering the right anterior abdominal wall coiling in the abdomen and terminating in the low midline. Small amount of fluid within a tiny umbilical fat containing hernia. No bowel  containing hernias. Fat containing left inguinal hernia as well. Musculoskeletal: No acute osseous abnormality or suspicious osseous lesion. Sclerotic changes with subcortical cystic features and vacuum disc phenomenon centered at the L3-4 level, favored to be degenerative. IMPRESSION: 1. Some patchy areas of ground-glass opacity predominantly in the right middle and both lower lobes including more focal consolidative density in the medial aspect of the right middle lobe. Some more minimal areas of patchy ground-glass are seen in the right upper lobe as well. Findings are could be seen as sequela of aspiration or atypical infection, including viral etiologies. 2. Trace low-attenuation free fluid in the deep pelvis, nonspecific given the presence of a VP shunt catheter. 3. Multiple vascular coil packs towards the first portion of the duodenum compatible with prior percutaneous coil embolization seen on comparison angiography. No acute duodenal abnormality at this time. 4. Mild symmetric bilateral perinephric stranding, a nonspecific finding. Can be an incidental finding seen with advanced age or diminished renal function though recommend correlation with urinalysis to exclude urinary tract infection. 5. Emphysema (ICD10-J43.9). Electronically Signed   By: Kreg Shropshire M.D.   On: 03/14/2021 00:57   DG Chest Port 1 View  Result Date: 03/14/2021 CLINICAL DATA:  Sepsis EXAM: PORTABLE CHEST 1 VIEW COMPARISON:  None. FINDINGS: The heart size and mediastinal contours are within normal limits. Hazy airspace opacity is seen at the right lung base. The left lung is clear. No pleural effusion is seen. No acute osseous abnormality. IMPRESSION: Hazy airspace opacity at the right lung base which may be due to atelectasis and/or infectious etiology. Electronically Signed   By: Jonna Clark M.D.   On: 03/14/2021 00:22        Scheduled Meds: . atorvastatin  20 mg Oral Daily  . carbamazepine  300 mg Oral q morning   And   . carbamazepine  600 mg Oral QHS  . enoxaparin (LOVENOX) injection  40 mg Subcutaneous Q24H  . escitalopram  20 mg Oral Daily  . [START ON 03/16/2021] ferrous sulfate  325 mg Oral Q breakfast  . lamoTRIgine  200 mg Oral BID  . multivitamin with minerals  1 tablet Oral Daily  . pantoprazole  40 mg Oral BID  . tamsulosin  0.4 mg Oral QPC breakfast  . vitamin B-12  100 mcg Oral Daily   Continuous Infusions: . azithromycin Stopped (03/15/21 0500)  . cefTRIAXone (ROCEPHIN)  IV Stopped (03/15/21 0630)  . lactated ringers 100 mL/hr at 03/15/21 0829     LOS:  1 day    Time spent: 35 minutes    Tommye Lehenbauer A Sunnie Nielsen, MD [facility]   If 7PM-7AM, please contact night-coverage [email or web address]  03/15/2021, 4:33 PM

## 2021-03-15 NOTE — Progress Notes (Signed)
PT Cancellation Note  Patient Details Name: BRIANA NEWMAN MRN: [id number] DOB: 03-23-1966   Cancelled Treatment:    Reason Eval/Treat Not Completed: Patient declined, no reason specified.  PT order received and chart reviewed.  PT attempted to see pt for the second time, but pt refused claiming that he was going home tomorrow and that he was still too tired to participate in therapy, even with max encouragement.  PT will re-attempt evaluation at later date/time as medically appropriate.   Nolon Bussing, PT, DPT 03/15/21, 5:24 PM

## 2021-03-16 MED ORDER — CEFDINIR 300 MG PO CAPS: 300.0000 mg | ORAL_CAPSULE | Freq: Two times a day (BID) | ORAL | 0 refills | Status: AC

## 2021-03-16 MED ORDER — CYANOCOBALAMIN 100 MCG PO TABS: 100.0000 ug | ORAL_TABLET | Freq: Every day | ORAL | 1 refills | Status: DC

## 2021-03-16 MED ORDER — AZITHROMYCIN 500 MG PO TABS: 500.0000 mg | ORAL_TABLET | Freq: Every day | ORAL | 0 refills | Status: AC

## 2021-03-16 MED ORDER — FERROUS SULFATE 325 (65 FE) MG PO TABS: 325.0000 mg | ORAL_TABLET | Freq: Every day | ORAL | 3 refills | Status: AC

## 2021-03-16 NOTE — Discharge Summary (Signed)
Physician Discharge Summary  Zachary Thornton:[id number] DOB: 1966/08/07 DOA: 03/13/2021  PCP: Zachary Ran, MD  Admit date: 03/13/2021 Discharge date: 03/16/2021  Admitted From: Home  Disposition: Home   Recommendations for Outpatient Follow-up:  1. Follow up with PCP in 1-2 weeks 2. Please obtain BMP/CBC in one week 3. Needs chest x ray to document resolution of PNA>  4. Monitor BP  Home Health: None  Discharge Condition: Stable.  CODE STATUS: Full code Diet recommendation: Heart Healthy  Brief/Interim Summary: 55 year old with past medical history significant for chronic encephalopathy, intravascular shunt, seizure disorder, bleeding duodenal ulcer in 2006 requiring 9 units of packed red blood cell and coil embolization who presents to the emergency room following an episode of vomiting preceded by malaise.  Vomitus was nonbloody or coffee-ground.  Patient was previously in his usual state of health.  He denies abdominal pain, fevers or chills.  Denies cough or shortness of breath.  Caregiver was unsure whether he had black or bloody stool.  ED course on arrival he was tachycardic heart rate 122, tachypnea respiration rate 28 oxygen saturation 93 on room air.  Blood pressure 108/75 subsequently decreased to 87/62.  Spiking fever 100.5.  Lactic acid 3.1, troponin negative.  Lipase normal, UA unremarkable.  Sodium 127.  EKG no acute ST-T wave changes.  CT head no changes from before.  CT chest abdomen and pelvis with contrast showed patchy area of groundglass opacity predominantly right middle lobe consistent with infection and sequela of aspiration.  No acute duodenal abnormality.   1-Severe Sepsis, secondary to Pneumonia possible aspiration: -Patient presents with fever, tachycardia, hypotension, elevated lactic acid. -CT chest abdomen and pelvis with contrast significant for pneumonia without other acute finding. -Treated  with IV fluids -Treated with  IV ceftriaxone and  azithromycin -He pass swallow evaluation.  -he remain stable. 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   3-Seizure Disorder:  -Continue with carbamazepine and Lamictal  4-History of duodenal ulcer with hemorrhage. CT abdomen  and pelvis with not acute duodenal  abnormality -Continue with PPI.  5-B-12 low normal; range started  supplement.  6-Iron deficiency anemia; started  Iron supplement. He will need screening colonoscopy out patient.  7-Elevated BP; will monitor might need medication if continue to be elevated. Improved. Needs close follow up out patient.  8-Hyponatremia; resolved with IV fluids.    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 (HCC)   Cognitive deficit as late effect of traumatic brain injury (HCC)   History of duodenal ulcer with hemorrhage   Pneumonia   Sepsis [facility])    Discharge Instructions  Discharge Instructions    Diet - low sodium heart healthy   Complete by: As directed    Increase activity slowly   Complete by: As directed      Allergies as of 03/16/2021      Reactions   Nuedexta [dextromethorphan-quinidine] Anaphylaxis   Lips began to swell   Nsaids Other (See Comments)   Due to GI bleeding      Medication List    TAKE these medications   atorvastatin 20 MG tablet Commonly known as: LIPITOR Take 20 mg by mouth daily.  azithromycin 500 MG tablet Commonly known as: Zithromax Take 1 tablet (500 mg total) by mouth daily for 3 days. Take 1 tablet daily for 3 days.   carbamazepine 300 MG 12 hr capsule Commonly known as: CARBATROL TAKE 1 CAPSULE IN THE MORNING AND 2 CAPSULES IN THE  EVENING. What changed: See the new instructions.   cefdinir 300 MG capsule Commonly known as: OMNICEF Take 1 capsule (300 mg total) by mouth 2 (two) times daily for 3 days.   cyanocobalamin 100 MCG tablet Take 1 tablet (100 mcg total) by mouth daily. Start taking on: March 17, 2021   escitalopram 20 MG tablet Commonly known as: LEXAPRO Take 20 mg by mouth daily.   ferrous sulfate 325 (65 FE) MG tablet Take 1 tablet (325 mg total) by mouth daily with breakfast. Start taking on: March 17, 2021   lamoTRIgine 100 MG tablet Commonly known as: LAMICTAL TAKE (2) TABLETS TWICE DAILY. What changed: See the new instructions.   multivitamin with minerals Tabs tablet Take 1 tablet by mouth daily.   pantoprazole 40 MG tablet Commonly known as: PROTONIX TAKE 1 TABLET TWICE DAILY.   STOOL SOFTENER PO Take 2 tablets by mouth at bedtime.   tamsulosin 0.4 MG Caps capsule Commonly known as: FLOMAX Take 1 capsule (0.4 mg total) by mouth daily after breakfast.       Follow-up Information    Zachary Ran, MD Follow up in 1 week(s).   Specialty: Internal Medicine Contact information: [address] [place] [address] [phone]              Allergies  Allergen Reactions  . Nuedexta [Dextromethorphan-Quinidine] Anaphylaxis    Lips began to swell  . Nsaids Other (See Comments)    Due to GI bleeding    Consultations:  None   Procedures/Studies: CT Head Wo Contrast  Result Date: 03/14/2021 CLINICAL DATA:  Shunt follow-up EXAM: CT HEAD WITHOUT CONTRAST TECHNIQUE: Contiguous axial images were obtained from the base of the skull through the vertex without intravenous contrast. COMPARISON:  03/23/2017 FINDINGS: Brain: There is a left frontal approach shunt catheter with tip in the frontal horn of the left lateral ventricle. Unchanged appearance of dural hyperdensity underlying the right-sided craniotomy. Bifrontal and bitemporal encephalomalacia and bilateral cerebellar  encephalomalacia is unchanged. No acute hemorrhage. Size and configuration of the ventricles are unchanged. Vascular: No hyperdense vessel or unexpected calcification. Skull: Remote right pterional craniectomy with cranioplasty and right suboccipital craniectomy. Sinuses/Orbits: No acute finding. Other: None IMPRESSION: 1. Unchanged size and configuration of the shunted ventricles. 2. Unchanged multifocal encephalomalacia in a pattern consistent with remote trauma. Electronically Signed   By: Deatra Robinson M.D.   On: 03/14/2021 03:08   CT CHEST ABDOMEN PELVIS W CONTRAST  Result Date: 03/14/2021 CLINICAL DATA:  History of TBI, altered behavior per caregiver, emesis today at 1400 hours, history of bleeding ulcers EXAM: CT CHEST, ABDOMEN, AND PELVIS WITH CONTRAST TECHNIQUE: Multidetector CT imaging of the chest, abdomen and pelvis was performed following the standard protocol during bolus administration of intravenous contrast. CONTRAST:  OMNIPAQUE IOHEXOL 300 MG/ML  SOLN COMPARISON:  Angiography 11/01/2015 FINDINGS: CT CHEST FINDINGS Cardiovascular: Normal heart size. No pericardial effusion. The aortic root is suboptimally assessed given cardiac pulsation artifact. The aorta is normal caliber. No acute luminal abnormality of the imaged aorta. No periaortic stranding or hemorrhage. Normal 3 vessel branching of the aortic arch. Proximal great vessels are unremarkable. Central pulmonary arteries are normal caliber. No large central or lobar  filling defects with more distal evaluation limited on this non tailored examination of the pulmonary arteries. Mediastinum/Nodes: No mediastinal fluid or gas. Normal thyroid gland and thoracic inlet. No acute abnormality of the trachea or esophagus. Few scattered subcentimeter mediastinal and hilar nodes are likely reactive. No worrisome mediastinal, hilar or axillary adenopathy. Lungs/Pleura: Mild airways thickening and scattered secretions. Some patchy areas of  ground-glass opacity are present predominantly in the right middle and both lower lobes including more focal consolidative density in the medial aspect of the right middle lobe. Some more minimal areas of patchy ground-glass are seen in the right upper lobe as well. No pneumothorax. No effusion. Background of paraseptal predominant emphysematous changes. Musculoskeletal: Ventriculoperitoneal catheter tubing courses from the base of the left neck, crossing midline and extending along the right chest wall without visible discontinuity. No chest wall mass or suspicious bone lesions identified. CT ABDOMEN PELVIS FINDINGS Hepatobiliary: 12 mm fluid attenuation cyst seen in the posterior right lobe liver (2/52). No other focal liver lesion. Smooth surface contour. Hepatic attenuation remains within normal limits. No biliary ductal dilatation. No visible calcified gallstones. Gallbladder is unremarkable without pericholecystic fluid or inflammation. Pancreas: No pancreatic ductal dilatation or surrounding inflammatory changes. Spleen: Normal in size. No concerning splenic lesions. Adrenals/Urinary Tract: Normal adrenal glands. Kidneys are normally located with symmetric enhancementand excretion. Few subcentimeter hypoattenuating foci in the kidneys too small to fully characterize on CT imaging but statistically likely benign. No suspicious renal lesion, urolithiasis or hydronephrosis. Mild symmetric bilateral perinephric stranding, a nonspecific finding. Stomach/Bowel: Distal esophagus, stomach are unremarkable. Multiple vascular coil packs are seen towards the first portion of the duodenum compatible with prior percutaneous coil embolization seen on comparison angiography. No acute abnormality of the duodenal sweep. No small bowel thickening or dilatation is seen. Appendix coils about the cecal tip without acute periappendiceal inflammation to suggest acute appendicitis. No colonic dilatation or wall thickening. No bowel  obstruction. Vascular/Lymphatic: No significant vascular findings are present. No enlarged abdominal or pelvic lymph nodes. Reproductive: Benign-appearing coarse eccentric calcification of the prostate. No concerning abnormalities of the prostate or seminal vesicles. Small calcifications noted at the level of the glans. Included external genitalia is otherwise unremarkable. Other: Trace low-attenuation free fluid in the deep pelvis, nonspecific given the presence of a VP shunt catheter which is seen entering the right anterior abdominal wall coiling in the abdomen and terminating in the low midline. Small amount of fluid within a tiny umbilical fat containing hernia. No bowel containing hernias. Fat containing left inguinal hernia as well. Musculoskeletal: No acute osseous abnormality or suspicious osseous lesion. Sclerotic changes with subcortical cystic features and vacuum disc phenomenon centered at the L3-4 level, favored to be degenerative. IMPRESSION: 1. Some patchy areas of ground-glass opacity predominantly in the right middle and both lower lobes including more focal consolidative density in the medial aspect of the right middle lobe. Some more minimal areas of patchy ground-glass are seen in the right upper lobe as well. Findings are could be seen as sequela of aspiration or atypical infection, including viral etiologies. 2. Trace low-attenuation free fluid in the deep pelvis, nonspecific given the presence of a VP shunt catheter. 3. Multiple vascular coil packs towards the first portion of the duodenum compatible with prior percutaneous coil embolization seen on comparison angiography. No acute duodenal abnormality at this time. 4. Mild symmetric bilateral perinephric stranding, a nonspecific finding. Can be an incidental finding seen with advanced age or diminished renal function though recommend correlation with urinalysis  to exclude urinary tract infection. 5. Emphysema (ICD10-J43.9). Electronically  Signed   By: Kreg Shropshire M.D.   On: 03/14/2021 00:57   DG Chest Port 1 View  Result Date: 03/14/2021 CLINICAL DATA:  Sepsis EXAM: PORTABLE CHEST 1 VIEW COMPARISON:  None. FINDINGS: The heart size and mediastinal contours are within normal limits. Hazy airspace opacity is seen at the right lung base. The left lung is clear. No pleural effusion is seen. No acute osseous abnormality. IMPRESSION: Hazy airspace opacity at the right lung base which may be due to atelectasis and/or infectious etiology. Electronically Signed   By: Jonna Clark M.D.   On: 03/14/2021 00:22    (Echo, Carotid, EGD, Colonoscopy, ERCP)    Subjective:   Discharge Exam: Vitals:   03/16/21 0305 03/16/21 0718  BP: (!) 149/102 129/78  Pulse: 86 82  Resp: 15 18  Temp: 98.2 F (36.8 C) 98.2 F (36.8 C)  SpO2: 100% 93%     General: Pt is alert, awake, not in acute distress Cardiovascular: RRR, S1/S2 +, no rubs, no gallops Respiratory: CTA bilaterally, no wheezing, no rhonchi Abdominal: Soft, NT, ND, bowel sounds + Extremities: no edema, no cyanosis    The results of significant diagnostics from this hospitalization (including imaging, microbiology, ancillary and laboratory) are listed below for reference.     Microbiology: Recent Results (from the past 240 hour(s))  Blood Culture (routine x 2)     Status: None (Preliminary result)   Collection Time: 03/13/21 11:49 PM   Specimen: BLOOD  Result Value Ref Range Status   Specimen Description BLOOD RIGHT ANTECUBITAL  Final   Special Requests   Final    BOTTLES DRAWN AEROBIC AND ANAEROBIC Blood Culture adequate volume   Culture   Final    NO GROWTH 2 DAYS Performed at [facility], [address], [place], [place] [address]    Report Status PENDING  Incomplete  Blood Culture (routine x 2)     Status: None (Preliminary result)   Collection Time: 03/14/21 12:10 AM   Specimen: BLOOD  Result Value Ref Range Status   Specimen Description BLOOD BLOOD  LEFT WRIST  Final   Special Requests   Final    BOTTLES DRAWN AEROBIC AND ANAEROBIC Blood Culture results may not be optimal due to an inadequate volume of blood received in culture bottles   Culture   Final    NO GROWTH 2 DAYS Performed at [facility], [address] [place], [place] [address]    Report Status PENDING  Incomplete  Resp Panel by RT-PCR (Flu A&B, Covid) Nasopharyngeal Swab     Status: None   Collection Time: 03/14/21 12:11 AM   Specimen: Nasopharyngeal Swab; Nasopharyngeal(NP) swabs in vial transport medium  Result Value Ref Range Status   SARS Coronavirus 2 by RT PCR NEGATIVE NEGATIVE Final    Comment: (NOTE) SARS-CoV-2 target nucleic acids are NOT DETECTED.  The SARS-CoV-2 RNA is generally detectable in upper respiratory specimens during the acute phase of infection. The lowest concentration of SARS-CoV-2 viral copies this assay can detect is 138 copies/mL. A negative result does not preclude SARS-Cov-2 infection and should not be used as the sole basis for treatment or other patient management decisions. A negative result may occur with  improper specimen collection/handling, submission of specimen other than nasopharyngeal swab, presence of viral mutation(s) within the areas targeted by this assay, and inadequate number of viral copies(<138 copies/mL). A negative result must be combined with clinical observations, patient  history, and epidemiological information. The expected result is Negative.  Fact Sheet for Patients:  BloggerCourse.com  Fact Sheet for Healthcare Providers:  SeriousBroker.it  This test is no t yet approved or cleared by the [place] FDA and  has been authorized for detection and/or diagnosis of SARS-CoV-2 by FDA under an Emergency Use Authorization (EUA). This EUA will remain  in effect (meaning this test can be used) for the duration of the COVID-19 declaration under  Section 564(b)(1) of the Act, 21 U.S.C.section 360bbb-3(b)(1), unless the authorization is terminated  or revoked sooner.       Influenza A by PCR NEGATIVE NEGATIVE Final   Influenza B by PCR NEGATIVE NEGATIVE Final    Comment: (NOTE) The Xpert Xpress SARS-CoV-2/FLU/RSV plus assay is intended as an aid in the diagnosis of influenza from Nasopharyngeal swab specimens and should not be used as a sole basis for treatment. Nasal washings and aspirates are unacceptable for Xpert Xpress SARS-CoV-2/FLU/RSV testing.  Fact Sheet for Patients: BloggerCourse.com  Fact Sheet for Healthcare Providers: SeriousBroker.it  This test is not yet approved or cleared by the [place] FDA and has been authorized for detection and/or diagnosis of SARS-CoV-2 by FDA under an Emergency Use Authorization (EUA). This EUA will remain in effect (meaning this test can be used) for the duration of the COVID-19 declaration under Section 564(b)(1) of the Act, 21 U.S.C. section 360bbb-3(b)(1), unless the authorization is terminated or revoked.  Performed at [facility], [address], [place], [place] [address]   Urine culture     Status: Abnormal   Collection Time: 03/14/21  2:15 AM   Specimen: In/Out Cath Urine  Result Value Ref Range Status   Specimen Description   Final    IN/OUT CATH URINE Performed at [facility], [address] [place], [place] [address]    Special Requests   Final    NONE Performed at [facility], [address] [place], [place] [address]    Culture (A)  Final    <10,000 COLONIES/mL INSIGNIFICANT GROWTH Performed at [facility], [address] [place], [place] [address]    Report Status 03/15/2021 FINAL  Final     Labs: BNP (last 3 results) No results for input(s): BNP in the last 8760 hours. Basic Metabolic Panel: Recent Labs  Lab 03/13/21 2022 03/14/21 0445 03/14/21 0906  03/15/21 0738  NA 127*  --  131* 135  K 4.0  --  4.1 3.6  CL 92*  --  99 101  CO2 23  --  25 26  GLUCOSE 158*  --  108* 110*  BUN 16  --  13 7  CREATININE 0.98 0.95 0.89 0.70  CALCIUM 9.0  --  8.1* 8.5*   Liver Function Tests: Recent Labs  Lab 03/13/21 2022  AST 27  ALT 21  ALKPHOS 92  BILITOT 0.8  PROT 7.4  ALBUMIN 4.4   Recent Labs  Lab 03/13/21 2022  LIPASE 28   No results for input(s): AMMONIA in the last 168 hours. CBC: Recent Labs  Lab 03/13/21 2022 03/14/21 0445 03/15/21 0738  WBC 9.9  10.4 12.0* 9.0  NEUTROABS 9.3*  --   --   HGB 14.5  14.6 11.4* 11.6*  HCT 42.6  42.3 32.9* 33.3*  MCV 94.5  92.8 93.5 92.8  PLT 244  221 166 183   Cardiac Enzymes: No results for input(s): CKTOTAL, CKMB, CKMBINDEX, TROPONINI in the last 168 hours. BNP: Invalid input(s): POCBNP  CBG: No results for input(s): GLUCAP in the last 168 hours. D-Dimer No results for input(s): DDIMER in the last 72 hours. Hgb A1c No results for input(s): HGBA1C in the last 72 hours. Lipid Profile No results for input(s): CHOL, HDL, LDLCALC, TRIG, CHOLHDL, LDLDIRECT in the last 72 hours. Thyroid function studies No results for input(s): TSH, T4TOTAL, T3FREE, THYROIDAB in the last 72 hours.  Invalid input(s): FREET3 Anemia work up Recent Labs    03/15/21 0738  VITAMINB12 260  FOLATE 16.7  FERRITIN 123  TIBC 232*  IRON 9*  RETICCTPCT 1.9   Urinalysis    Component Value Date/Time   COLORURINE YELLOW (A) 03/14/2021 0215   APPEARANCEUR CLEAR (A) 03/14/2021 0215   LABSPEC 1.017 03/14/2021 0215   PHURINE 6.0 03/14/2021 0215   GLUCOSEU NEGATIVE 03/14/2021 0215   HGBUR NEGATIVE 03/14/2021 0215   BILIRUBINUR NEGATIVE 03/14/2021 0215   KETONESUR NEGATIVE 03/14/2021 0215   PROTEINUR NEGATIVE 03/14/2021 0215   UROBILINOGEN 0.2 10/29/2015 2230   NITRITE NEGATIVE 03/14/2021 0215   LEUKOCYTESUR NEGATIVE 03/14/2021 0215   Sepsis Labs Invalid input(s): PROCALCITONIN,  WBC,   LACTICIDVEN Microbiology Recent Results (from the past 240 hour(s))  Blood Culture (routine x 2)     Status: None (Preliminary result)   Collection Time: 03/13/21 11:49 PM   Specimen: BLOOD  Result Value Ref Range Status   Specimen Description BLOOD RIGHT ANTECUBITAL  Final   Special Requests   Final    BOTTLES DRAWN AEROBIC AND ANAEROBIC Blood Culture adequate volume   Culture   Final    NO GROWTH 2 DAYS Performed at [facility], [address], [place], [place] [address]    Report Status PENDING  Incomplete  Blood Culture (routine x 2)     Status: None (Preliminary result)   Collection Time: 03/14/21 12:10 AM   Specimen: BLOOD  Result Value Ref Range Status   Specimen Description BLOOD BLOOD LEFT WRIST  Final   Special Requests   Final    BOTTLES DRAWN AEROBIC AND ANAEROBIC Blood Culture results may not be optimal due to an inadequate volume of blood received in culture bottles   Culture   Final    NO GROWTH 2 DAYS Performed at [facility], [address], [place], [place] [address]    Report Status PENDING  Incomplete  Resp Panel by RT-PCR (Flu A&B, Covid) Nasopharyngeal Swab     Status: None   Collection Time: 03/14/21 12:11 AM   Specimen: Nasopharyngeal Swab; Nasopharyngeal(NP) swabs in vial transport medium  Result Value Ref Range Status   SARS Coronavirus 2 by RT PCR NEGATIVE NEGATIVE Final    Comment: (NOTE) SARS-CoV-2 target nucleic acids are NOT DETECTED.  The SARS-CoV-2 RNA is generally detectable in upper respiratory specimens during the acute phase of infection. The lowest concentration of SARS-CoV-2 viral copies this assay can detect is 138 copies/mL. A negative result does not preclude SARS-Cov-2 infection and should not be used as the sole basis for treatment or other patient management decisions. A negative result may occur with  improper specimen collection/handling, submission of specimen other than nasopharyngeal swab, presence of  viral mutation(s) within the areas targeted by this assay, and inadequate number of viral copies(<138 copies/mL). A negative result must be combined with clinical observations, patient history, and epidemiological information. The expected result is Negative.  Fact Sheet for Patients:  BloggerCourse.com  Fact Sheet for Healthcare Providers:  SeriousBroker.it  This test is no t yet approved or cleared by  the [employer] and  has been authorized for detection and/or diagnosis of SARS-CoV-2 by FDA under an Emergency Use Authorization (EUA). This EUA will remain  in effect (meaning this test can be used) for the duration of the COVID-19 declaration under Section 564(b)(1) of the Act, 21 U.S.C.section 360bbb-3(b)(1), unless the authorization is terminated  or revoked sooner.       Influenza A by PCR NEGATIVE NEGATIVE Final   Influenza B by PCR NEGATIVE NEGATIVE Final    Comment: (NOTE) The Xpert Xpress SARS-CoV-2/FLU/RSV plus assay is intended as an aid in the diagnosis of influenza from Nasopharyngeal swab specimens and should not be used as a sole basis for treatment. Nasal washings and aspirates are unacceptable for Xpert Xpress SARS-CoV-2/FLU/RSV testing.  Fact Sheet for Patients: BloggerCourse.com  Fact Sheet for Healthcare Providers: SeriousBroker.it  This test is not yet approved or cleared by the [place] FDA and has been authorized for detection and/or diagnosis of SARS-CoV-2 by FDA under an Emergency Use Authorization (EUA). This EUA will remain in effect (meaning this test can be used) for the duration of the COVID-19 declaration under Section 564(b)(1) of the Act, 21 U.S.C. section 360bbb-3(b)(1), unless the authorization is terminated or revoked.  Performed at [facility], [address] [place], [place] [address]   Urine culture     Status:  Abnormal   Collection Time: 03/14/21  2:15 AM   Specimen: In/Out Cath Urine  Result Value Ref Range Status   Specimen Description   Final    IN/OUT CATH URINE Performed at [facility], [address], [place], [place] [address]    Special Requests   Final    NONE Performed at [facility], [address] [place], [place] [address]    Culture (A)  Final    <10,000 COLONIES/mL INSIGNIFICANT GROWTH Performed at [facility], [address], [place], [place] [address]    Report Status 03/15/2021 FINAL  Final     Time coordinating discharge: 40 minutes  SIGNED:   Alba Cory, MD  [facility]

## 2021-03-16 NOTE — Evaluation (Signed)
Physical Therapy Evaluation Patient Details Name: MEHUL RUDIN MRN: [id number] DOB: 07/11/1966 Today's Date: 03/16/2021   History of Present Illness  55 y.o. male with medical history significant for , Chronic encephalopathy, intraventricular shunt, seizure disorder bleeding duodenal ulcer in 2016 requiring 9 units PRBC and coil embolization, who presents to the emergency room following an episode of vomiting preceded by malaise.  Vomitus was nonbloody nonbilious and without coffee grounds.  Patient was previously in his usual state of health.  He denied abdominal pain, fever or chills.  Denied cough or shortness of breath.  Caregiver was unsure whether he had black or bloody stool.  History taken from ER provider and records.  Clinical Impression  Patient received in bed, alert, pleasant. Agrees to PT assessment. Patient reports he walks better with shoes which he reports he does not have here. Reliant on UE support without shoes. He ambulated 400 feet, normal cadence. Patient will continue to benefit from skilled PT while here to improve balance and stability with ambulation.    Follow Up Recommendations No PT follow up    Equipment Recommendations  None recommended by PT    Recommendations for Other Services       Precautions / Restrictions Restrictions Weight Bearing Restrictions: No      Mobility  Bed Mobility Overal bed mobility: Independent Bed Mobility: Supine to Sit;Sit to Supine                Transfers Overall transfer level: Modified independent Equipment used: None Transfers: Sit to/from Stand              Ambulation/Gait Ambulation/Gait assistance: Supervision Gait Distance (Feet): 400 Feet Assistive device: IV Pole Gait Pattern/deviations: Step-through pattern Gait velocity: WNL   General Gait Details: decreased DF bilaterally and stiff legs B with gait. Reports he wears shoes normally which help his ambulation a lot.  Stairs             Wheelchair Mobility    Modified Rankin (Stroke Patients Only)       Balance Overall balance assessment: Needs assistance Sitting-balance support: Feet supported Sitting balance-Leahy Scale: Normal     Standing balance support: Bilateral upper extremity supported;During functional activity Standing balance-Leahy Scale: Good Standing balance comment: reliant on B UE support on IV pole with ambulation due to not having shoes here.                             Pertinent Vitals/Pain Pain Assessment: No/denies pain    Home Living Family/patient expects to be discharged to:: Private residence Living Arrangements: Non-relatives/Friends Available Help at Discharge: Family;Available PRN/intermittently Type of Home: House Home Access: Stairs to enter Entrance Stairs-Rails: Right;Left;Can reach both Entrance Stairs-Number of Steps: 10 Home Layout: One level Home Equipment: None      Prior Function Level of Independence: Independent         Comments: reports he ambulates better with shoes. Shoes are not here.     Hand Dominance   Dominant Hand: Right    Extremity/Trunk Assessment   Upper Extremity Assessment Upper Extremity Assessment: Defer to OT evaluation    Lower Extremity Assessment Lower Extremity Assessment: Overall WFL for tasks assessed    Cervical / Trunk Assessment Cervical / Trunk Assessment: Normal  Communication      Cognition Arousal/Alertness: Awake/alert Behavior During Therapy: WFL for tasks assessed/performed Overall Cognitive Status: No family/caregiver present to determine baseline cognitive functioning  General Comments: Very pleasant and cooperative.      General Comments      Exercises     Assessment/Plan    PT Assessment Patient needs continued PT services  PT Problem List Decreased balance       PT Treatment Interventions Gait training;Stair training    PT Goals  (Current goals can be found in the Care Plan section)  Acute Rehab PT Goals Patient Stated Goal: to go home PT Goal Formulation: With patient Time For Goal Achievement: 03/17/21 Potential to Achieve Goals: Good    Frequency Min 2X/week   Barriers to discharge        Co-evaluation               AM-PAC PT "6 Clicks" Mobility  Outcome Measure Help needed turning from your back to your side while in a flat bed without using bedrails?: None Help needed moving from lying on your back to sitting on the side of a flat bed without using bedrails?: None Help needed moving to and from a bed to a chair (including a wheelchair)?: A Little Help needed standing up from a chair using your arms (e.g., wheelchair or bedside chair)?: A Little Help needed to walk in hospital room?: A Little Help needed climbing 3-5 steps with a railing? : A Little 6 Click Score: 20    End of Session Equipment Utilized During Treatment: Gait belt Activity Tolerance: Patient tolerated treatment well Patient left: in bed;with call bell/phone within reach;with bed alarm set Nurse Communication: Mobility status PT Visit Diagnosis: Other abnormalities of gait and mobility (R26.89)    Time: 9373-4287 PT Time Calculation (min) (ACUTE ONLY): 11 min   Charges:   PT Evaluation $PT Eval Moderate Complexity: 1 Mod          Rosalie Gelpi, PT, GCS 03/16/21,11:02 AM

## 2021-03-16 NOTE — Progress Notes (Signed)
Gave discharge instructions to [facility], caregiver. Will gather items and remove IV for patient to discharge. Bed in low position and call bell in reach

## 2021-03-16 NOTE — Plan of Care (Signed)
Shift Summary: Pt Aox4, on RA, NSR on telemetry, VSS. No c/o pain. IV ABX infused w/o issue. Pt looking forward to going home today or tomorrow. Fall/safety precautions in place, rounding performed, continuing with plan of care.   Problem: Education: Goal: Knowledge of General Education information will improve Description: Including pain rating scale, medication(s)/side effects and non-pharmacologic comfort measures Outcome: Progressing   Problem: Health Behavior/Discharge Planning: Goal: Ability to manage health-related needs will improve Outcome: Progressing   Problem: Clinical Measurements: Goal: Ability to maintain clinical measurements within normal limits will improve Outcome: Progressing Goal: Will remain free from infection Outcome: Progressing Goal: Diagnostic test results will improve Outcome: Progressing Goal: Respiratory complications will improve Outcome: Progressing Goal: Cardiovascular complication will be avoided Outcome: Progressing   Problem: Activity: Goal: Risk for activity intolerance will decrease Outcome: Progressing   Problem: Nutrition: Goal: Adequate nutrition will be maintained Outcome: Progressing   Problem: Coping: Goal: Level of anxiety will decrease Outcome: Progressing   Problem: Elimination: Goal: Will not experience complications related to bowel motility Outcome: Progressing Goal: Will not experience complications related to urinary retention Outcome: Progressing   Problem: Pain Managment: Goal: General experience of comfort will improve Outcome: Progressing   Problem: Safety: Goal: Ability to remain free from injury will improve Outcome: Progressing   Problem: Skin Integrity: Goal: Risk for impaired skin integrity will decrease Outcome: Progressing

## 2021-03-19 LAB — CULTURE, BLOOD (ROUTINE X 2)
Culture: NO GROWTH
Culture: NO GROWTH
Special Requests: ADEQUATE

## 2021-03-23 DIAGNOSIS — Z125 Encounter for screening for malignant neoplasm of prostate: Secondary | ICD-10-CM | POA: Diagnosis not present

## 2021-03-23 DIAGNOSIS — E559 Vitamin D deficiency, unspecified: Secondary | ICD-10-CM | POA: Diagnosis not present

## 2021-03-23 DIAGNOSIS — E785 Hyperlipidemia, unspecified: Secondary | ICD-10-CM | POA: Diagnosis not present

## 2021-03-30 DIAGNOSIS — F419 Anxiety disorder, unspecified: Secondary | ICD-10-CM | POA: Diagnosis not present

## 2021-03-30 DIAGNOSIS — Z1331 Encounter for screening for depression: Secondary | ICD-10-CM | POA: Diagnosis not present

## 2021-03-30 DIAGNOSIS — S069X0S Unspecified intracranial injury without loss of consciousness, sequela: Secondary | ICD-10-CM | POA: Diagnosis not present

## 2021-03-30 DIAGNOSIS — R569 Unspecified convulsions: Secondary | ICD-10-CM | POA: Diagnosis not present

## 2021-03-30 DIAGNOSIS — J309 Allergic rhinitis, unspecified: Secondary | ICD-10-CM | POA: Diagnosis not present

## 2021-03-30 DIAGNOSIS — E785 Hyperlipidemia, unspecified: Secondary | ICD-10-CM | POA: Diagnosis not present

## 2021-03-30 DIAGNOSIS — Z982 Presence of cerebrospinal fluid drainage device: Secondary | ICD-10-CM | POA: Diagnosis not present

## 2021-03-30 DIAGNOSIS — Z0000 Encounter for general adult medical examination without abnormal findings: Secondary | ICD-10-CM | POA: Diagnosis not present

## 2021-03-30 DIAGNOSIS — D649 Anemia, unspecified: Secondary | ICD-10-CM | POA: Diagnosis not present

## 2021-06-22 DIAGNOSIS — H524 Presbyopia: Secondary | ICD-10-CM | POA: Diagnosis not present

## 2021-06-22 DIAGNOSIS — H5000 Unspecified esotropia: Secondary | ICD-10-CM | POA: Diagnosis not present

## 2021-07-10 ENCOUNTER — Other Ambulatory Visit: Payer: Self-pay | Admitting: Neurology

## 2021-07-23 ENCOUNTER — Other Ambulatory Visit: Payer: Self-pay

## 2021-07-23 ENCOUNTER — Ambulatory Visit
Admission: RE | Admit: 2021-07-23 | Discharge: 2021-07-23 | Disposition: A | Discharge status: Home / Self Care | Payer: PPO | Source: Ambulatory Visit | Attending: Internal Medicine | Admitting: Internal Medicine

## 2021-07-23 ENCOUNTER — Other Ambulatory Visit: Payer: Self-pay | Admitting: Internal Medicine

## 2021-07-23 DIAGNOSIS — R2689 Other abnormalities of gait and mobility: Secondary | ICD-10-CM | POA: Diagnosis not present

## 2021-07-23 DIAGNOSIS — S069X0S Unspecified intracranial injury without loss of consciousness, sequela: Secondary | ICD-10-CM | POA: Diagnosis not present

## 2021-07-23 DIAGNOSIS — Z79899 Other long term (current) drug therapy: Secondary | ICD-10-CM | POA: Diagnosis not present

## 2021-07-23 DIAGNOSIS — T783XXS Angioneurotic edema, sequela: Secondary | ICD-10-CM | POA: Diagnosis not present

## 2021-07-23 DIAGNOSIS — G40909 Epilepsy, unspecified, not intractable, without status epilepticus: Secondary | ICD-10-CM | POA: Diagnosis not present

## 2021-07-23 DIAGNOSIS — S0990XA Unspecified injury of head, initial encounter: Secondary | ICD-10-CM

## 2021-07-23 DIAGNOSIS — Z982 Presence of cerebrospinal fluid drainage device: Secondary | ICD-10-CM | POA: Diagnosis not present

## 2021-07-23 DIAGNOSIS — G9389 Other specified disorders of brain: Secondary | ICD-10-CM | POA: Diagnosis not present

## 2021-08-07 DIAGNOSIS — R2689 Other abnormalities of gait and mobility: Secondary | ICD-10-CM | POA: Diagnosis not present

## 2021-08-07 DIAGNOSIS — G40909 Epilepsy, unspecified, not intractable, without status epilepticus: Secondary | ICD-10-CM | POA: Diagnosis not present

## 2021-08-07 DIAGNOSIS — E785 Hyperlipidemia, unspecified: Secondary | ICD-10-CM | POA: Diagnosis not present

## 2021-08-07 DIAGNOSIS — Z982 Presence of cerebrospinal fluid drainage device: Secondary | ICD-10-CM | POA: Diagnosis not present

## 2021-08-07 DIAGNOSIS — T783XXS Angioneurotic edema, sequela: Secondary | ICD-10-CM | POA: Diagnosis not present

## 2021-08-07 DIAGNOSIS — K279 Peptic ulcer, site unspecified, unspecified as acute or chronic, without hemorrhage or perforation: Secondary | ICD-10-CM | POA: Diagnosis not present

## 2021-08-07 DIAGNOSIS — S069X0S Unspecified intracranial injury without loss of consciousness, sequela: Secondary | ICD-10-CM | POA: Diagnosis not present

## 2021-08-09 ENCOUNTER — Other Ambulatory Visit: Payer: Self-pay | Admitting: Neurology

## 2021-10-16 ENCOUNTER — Encounter: Payer: Self-pay | Admitting: Nurse Practitioner

## 2021-10-22 DIAGNOSIS — E785 Hyperlipidemia, unspecified: Secondary | ICD-10-CM | POA: Diagnosis not present

## 2021-10-22 DIAGNOSIS — F419 Anxiety disorder, unspecified: Secondary | ICD-10-CM | POA: Diagnosis not present

## 2021-10-22 DIAGNOSIS — G40909 Epilepsy, unspecified, not intractable, without status epilepticus: Secondary | ICD-10-CM | POA: Diagnosis not present

## 2021-11-06 ENCOUNTER — Other Ambulatory Visit (INDEPENDENT_AMBULATORY_CARE_PROVIDER_SITE_OTHER): Payer: PPO

## 2021-11-06 ENCOUNTER — Ambulatory Visit (INDEPENDENT_AMBULATORY_CARE_PROVIDER_SITE_OTHER): Payer: PPO | Admitting: Nurse Practitioner

## 2021-11-06 ENCOUNTER — Encounter: Payer: Self-pay | Admitting: Nurse Practitioner

## 2021-11-06 VITALS — BP 120/72 | HR 98 | Ht 68.0 in | Wt 179.8 lb

## 2021-11-06 DIAGNOSIS — K279 Peptic ulcer, site unspecified, unspecified as acute or chronic, without hemorrhage or perforation: Secondary | ICD-10-CM | POA: Diagnosis not present

## 2021-11-06 DIAGNOSIS — Z1211 Encounter for screening for malignant neoplasm of colon: Secondary | ICD-10-CM

## 2021-11-06 DIAGNOSIS — D509 Iron deficiency anemia, unspecified: Secondary | ICD-10-CM | POA: Diagnosis not present

## 2021-11-06 LAB — IBC PANEL
Iron: 136 ug/dL (ref 42–165)
Saturation Ratios: 39.2 % (ref 20.0–50.0)
TIBC: 347.2 ug/dL (ref 250.0–450.0)
Transferrin: 248 mg/dL (ref 212.0–360.0)

## 2021-11-06 LAB — CBC
HCT: 42.2 % (ref 39.0–52.0)
Hemoglobin: 14.4 g/dL (ref 13.0–17.0)
MCHC: 34.1 g/dL (ref 30.0–36.0)
MCV: 94.6 fl (ref 78.0–100.0)
Platelets: 256 10*3/uL (ref 150.0–400.0)
RBC: 4.46 Mil/uL (ref 4.22–5.81)
RDW: 12.7 % (ref 11.5–15.5)
WBC: 5.1 10*3/uL (ref 4.0–10.5)

## 2021-11-06 LAB — FERRITIN: Ferritin: 36.5 ng/mL (ref 22.0–322.0)

## 2021-11-06 MED ORDER — NA SULFATE-K SULFATE-MG SULF 17.5-3.13-1.6 GM/177ML PO SOLN: 1.0000 | Freq: Once | ORAL | 0 refills | Status: AC

## 2021-11-06 NOTE — Patient Instructions (Addendum)
If you are age 55 or younger, your body mass index should be between 19-25. Your Body mass index is 27.34 kg/m. If this is out of the aformentioned range listed, please consider follow up with your Primary Care Provider.   ________________________________________________________  The [facility] providers would like to encourage you to use [facility] to communicate with providers for non-urgent requests or questions.  Due to long hold times on the telephone, sending your provider a message by [facility] may be a faster and more efficient way to get a response.  Please allow 48 business hours for a response.  Please remember that this is for non-urgent requests.  _______________________________________________________  Bonita Quin have been scheduled for an endoscopy and colonoscopy. Please follow the written instructions given to you at your visit today. Please pick up your prep supplies at the pharmacy within the next 1-3 days. If you use inhalers (even only as needed), please bring them with you on the day of your procedure.  Your provider has requested that you go to the basement level for lab work before leaving today. Press "B" on the elevator. The lab is located at the first door on the left as you exit the elevator.  Follow up pending at this time.  Thank you for entrusting me with your care and choosing [facility].  Willette Cluster, NP-C

## 2021-11-06 NOTE — Progress Notes (Signed)
ASSESSMENT AND PLAN    #55 year old male referred by PCP for evaluation of iron deficiency anemia.  Unclear if patient ever has blood in stool as he is a limited historian.  Patient was started on oral iron in March 2022 when hgb was in low 11 range while hospitalized with PNA. Presenting hgb was normal.  --I do not have any recent labs.  Will obtain CBC and iron studies today.  -- Patient has never had a screening colonoscopy.  Will schedule him for one but given anemia and history of PUD I think upper endoscopy is also reasonable.  The risk and benefits of EGD with possible biopsies and colonoscopy with possible biopsies and polypectomy were discussed with the patient and his male partner.  The patient agrees to proceed  #History of major GI bleed secondary to duodenal ulcer in 2016.  Etiology unclear.  No H. pylori, apparently he was not taking NSAIDs at the time --Continue PPI   HISTORY OF PRESENT ILLNESS     Chief Complaint : Anemia  Zachary Thornton is a 55 y.o. male with a past medical history significant for traumatic brain injury in 2012, intraventricular shunt, seizure disorder, GI bleed secondary to duodenal ulcer requiring coil embolization in 2016. [name] See PMH below for any additional history.   Zachary Thornton is referred by his PCP for iron deficiency anemia.  I do not have any records from the referring provider.   In 2016 Zachary Thornton was hospitalized with a GI bleed.  He had a duodenal ulcer that was bleeding.  He required several units of blood during that admission.  EGD was remarkable for large bulbar duodenal ulcer.  Ulcer was not bleeding at the time but 2 days later he started to bleed again.  Repeat EGD showed a large bleeding ulcer treated with electrocautery and Endo clipping.  Later that night he started to have significant bleeding and underwent prophylactic coil embolization of the GDA with IR.  Follow-up EGD April 2017 showed the ulcer had healed.  Not clear what the cause of  the ulcer was.  No H. pylori, apparently was not taking NSAIDs.  He is still on BID PPI  Patient was in the hospital in March 2022 for pneumonia, suspected aspiration related to vomiting.  His presenting hemoglobin was normal at 14.6, couple days later it declined to 11.4 but that was probably after IV fluids.  His ferritin was 123, TIBC 232, iron percent saturation 4%.  I do not have any labs more recent than these but patient's partner/caregiver is certain that more recent labs have been drawn at PCPs office  Zachary Thornton is a limited historian.  His male partner says he will often not give details because he does not like to have medical care.  Therefore, she says that Zachary Thornton would not or possibly could not know if he ever has any blood in his stool.  He is on oral iron.  His male partner says that Zachary Thornton has not shown any signs of having abdominal pain. No nausea or vomiting.  His appetite is good.  He does not take any NSAIDs and this is for sure as she Zachary Thornton is his medication.  He has never had a screening colonoscopy   PREVIOUS GI EVALUATIONS:    April 2017 EGD to follow-up on bleeding duodenal ulcer Nodular mucosa in the duodenal bulb at site of previous duodenal ulcer (which has healed) Biopsied. - The examination was otherwise normal.   November 01, 2015 EGD  for melena ENDOSCOPIC IMPRESSION: Clean based GEJ ulceration Normal stomach Large duodenal ulcer with visible vessel and active oozing. Treatment with Goldprobe and Epinephrine injection achieved hemostasis. Surgical [P], duodenal bulb, nodular mucosa - BENIGN SMALL MUCOSA SHOWING PEPTIC INJURY WITH BRUNNER'S GLAND HYPERPLASIA. - NO DYSPLASIA OR MALIGNANCY IDENTIFIED.   October 30, 2015 EGD There was a large ulcer in just distal to the duodenal bulb, there appeared to be a blood clot adherant but due to it's location it was very difficult to fully visualize and impossible to remove the clot.. There was mucosal edema, friability  surrounding the ulcer which created anatomic distortion, narrowing just post-bulbar. This did not appear neoplast however. There was mild, non-specific distal gastritis. The antrum and body were biopsied and sent to pathology. The examination was otherwise normal. Retroflexed views revealed no abnormalities. The scope was then withdrawn from the patient and the procedure completed.  Stomach, biopsy CHEMICAL GASTRITIS/REACTIVE GASTROPATHY NO HELICOBACTER PYLORI ORGANISM ARE IDENTIFIED NEGATIVE FOR DYSPLASIA OR MALIGNANCY  COMPLICATIONS: There were no immediate complications. ENDOSCOPIC IMPRESSION: Post bulbar duodenal ulcer, see above  Past Medical History:  Diagnosis Date   Back injury    Blood loss anemia    Dementia with behavioral disturbance    Duodenal ulcer hemorrhage    GI bleed    Headache    Seizures (HCC)    TBI (traumatic brain injury)      Past Surgical History:  Procedure Laterality Date   BRAIN SURGERY     CRANIOTOMY     Post TBI craniotomy and plate insertion   CSF SHUNT     ESOPHAGOGASTRODUODENOSCOPY N/A 11/01/2015   Procedure: ESOPHAGOGASTRODUODENOSCOPY (EGD);  Surgeon: Manus Gunning, MD;  Location: [facility];  Service: Gastroenterology;  Laterality: N/A;   ESOPHAGOGASTRODUODENOSCOPY (EGD) WITH PROPOFOL N/A 10/30/2015   Procedure: ESOPHAGOGASTRODUODENOSCOPY (EGD) WITH PROPOFOL;  Surgeon: Milus Banister, MD;  Location: [facility];  Service: Endoscopy;  Laterality: N/A;   Family History  Problem Relation Age of Onset   Heart disease Father        MVR   Hypertension Mother    Stroke Maternal Aunt    Social History   Tobacco Use   Smoking status: Never   Smokeless tobacco: Never  Vaping Use   Vaping Use: Never used  Substance Use Topics   Alcohol use: No    Alcohol/week: 0.0 standard drinks   Drug use: No   Current Outpatient Medications  Medication Sig Dispense Refill   atorvastatin (LIPITOR) 20 MG tablet Take 20 mg by mouth daily.      carbamazepine (CARBATROL) 300 MG 12 hr capsule TAKE ONE CAPSULE IN THE MORNING AND TWO CAPSULES IN THE EVENING. 90 capsule 4   Docusate Calcium (STOOL SOFTENER PO) Take 2 tablets by mouth at bedtime.     escitalopram (LEXAPRO) 20 MG tablet Take 20 mg by mouth daily.     ferrous sulfate 325 (65 FE) MG tablet Take 1 tablet (325 mg total) by mouth daily with breakfast. 30 tablet 3   lamoTRIgine (LAMICTAL) 100 MG tablet TAKE TWO TABLETS TWICE DAILY. 120 tablet 4   Multiple Vitamin (MULTIVITAMIN WITH MINERALS) TABS Take 1 tablet by mouth daily.     Na Sulfate-K Sulfate-Mg Sulf 17.5-3.13-1.6 GM/177ML SOLN Take 1 kit by mouth once for 1 dose. 324 mL 0   pantoprazole (PROTONIX) 40 MG tablet TAKE 1 TABLET TWICE DAILY. (Patient taking differently: Take 40 mg by mouth 2 (two) times daily.) 60 tablet 0   tamsulosin (FLOMAX)  0.4 MG CAPS capsule Take 1 capsule (0.4 mg total) by mouth daily after breakfast. 30 capsule 0   vitamin B-12 100 MCG tablet Take 1 tablet (100 mcg total) by mouth daily. 30 tablet 1   No current facility-administered medications for this visit.   Allergies  Allergen Reactions   Nuedexta [Dextromethorphan-Quinidine] Anaphylaxis    Lips began to swell   Nsaids Other (See Comments)    Due to GI bleeding     Review of Systems: Positive for confusion, depression.  All other systems reviewed and negative except where noted in HPI.    PHYSICAL EXAM :    Wt Readings from Last 3 Encounters:  11/06/21 179 lb 12.8 oz (81.6 kg)  03/13/21 170 lb (77.1 kg)  02/13/21 177 lb (80.3 kg)    BP 120/72   Pulse 98   Ht 5' 8"  (1.727 m)   Wt 179 lb 12.8 oz (81.6 kg)   BMI 27.34 kg/m  Constitutional:  Generally well appearing male in no acute distress. Psychiatric: Pleasant. Normal mood and affect. Behavior is normal. EENT: Pupils normal.  Conjunctivae are normal. No scleral icterus. Neck supple.  Cardiovascular: Normal rate, regular rhythm. No edema Pulmonary/chest: Effort normal  and breath sounds normal. No wheezing, rales or rhonchi. Abdominal: Soft, nondistended, nontender. Bowel sounds active throughout. There are no masses palpable. No hepatomegaly. Neurological: Alert and oriented to person place and time. Skin: Skin is warm and dry. No rashes noted.  Tye Savoy, NP  11/06/2021, 6:18 PM  Cc:  Referring Provider Crist Infante, MD

## 2021-11-07 NOTE — Progress Notes (Signed)
I agree with the above note, plan 

## 2021-11-19 DIAGNOSIS — H9193 Unspecified hearing loss, bilateral: Secondary | ICD-10-CM | POA: Diagnosis not present

## 2021-11-19 DIAGNOSIS — H6123 Impacted cerumen, bilateral: Secondary | ICD-10-CM | POA: Diagnosis not present

## 2021-12-08 ENCOUNTER — Other Ambulatory Visit: Payer: Self-pay | Admitting: Neurology

## 2021-12-26 ENCOUNTER — Telehealth: Payer: Self-pay | Admitting: Gastroenterology

## 2021-12-26 NOTE — Telephone Encounter (Signed)
Patients care giver called and said they are wanting to know the protocol for Covid -19 and requested a call back since his procedure is scheduled for Friday.

## 2021-12-26 NOTE — Telephone Encounter (Signed)
Patient's caretaker has been having a cough and scratchy throat. She has tested negative for covid but will retest tomorrow to confirm that she is negative still. She has advised me that the patient is not having any symptoms or running a fever. I advised as long as he is not symptomatic, running a fever and she continue to be negative, that he would be okay to proceed with his procedure.

## 2021-12-28 ENCOUNTER — Ambulatory Visit (AMBULATORY_SURGERY_CENTER): Payer: PPO | Admitting: Gastroenterology

## 2021-12-28 ENCOUNTER — Encounter: Payer: Self-pay | Admitting: Gastroenterology

## 2021-12-28 VITALS — BP 103/55 | HR 80 | Temp 98.9°F | Resp 15 | Ht 68.0 in | Wt 179.0 lb

## 2021-12-28 DIAGNOSIS — K449 Diaphragmatic hernia without obstruction or gangrene: Secondary | ICD-10-CM | POA: Diagnosis not present

## 2021-12-28 DIAGNOSIS — D509 Iron deficiency anemia, unspecified: Secondary | ICD-10-CM | POA: Diagnosis not present

## 2021-12-28 DIAGNOSIS — Z1211 Encounter for screening for malignant neoplasm of colon: Secondary | ICD-10-CM

## 2021-12-28 MED ORDER — SODIUM CHLORIDE 0.9 % IV SOLN: 500.0000 mL | Freq: Once | INTRAVENOUS | Status: DC

## 2021-12-28 NOTE — Op Note (Signed)
[facility] Patient Name: Zachary LoosenScott Keeter Procedure Date: 12/28/2021 1:44 PM MRN: [id number] Endoscopist: Rachael Feeaniel P Helon Wisinski , MD Age: 56 Referring MD:  Date of Birth: 07/08/1966 Gender: Male Account #: [id number] Procedure:                Upper GI endoscopy Indications:              Iron deficiency anemia Medicines:                Monitored Anesthesia Care Procedure:                Pre-Anesthesia Assessment:                           - Prior to the procedure, a History and Physical                            was performed, and patient medications and                            allergies were reviewed. The patient's tolerance of                            previous anesthesia was also reviewed. The risks                            and benefits of the procedure and the sedation                            options and risks were discussed with the patient.                            All questions were answered, and informed consent                            was obtained. Prior Anticoagulants: The patient has                            taken no previous anticoagulant or antiplatelet                            agents. ASA Grade Assessment: III - A patient with                            severe systemic disease. After reviewing the risks                            and benefits, the patient was deemed in                            satisfactory condition to undergo the procedure.                           After obtaining informed consent, the endoscope was  passed under direct vision. Throughout the                            procedure, the patient's blood pressure, pulse, and                            oxygen saturations were monitored continuously. The                            Endoscope was introduced through the mouth, and                            advanced to the second part of duodenum. The upper                            GI endoscopy was accomplished  without difficulty.                            The patient tolerated the procedure well. Scope In: Scope Out: Findings:                 A small hiatal hernia was present.                           Biopsies for histology were taken with a cold                            forceps in the (normal appearing) duodenum for                            evaluation of celiac disease.                           The exam was otherwise without abnormality. Complications:            No immediate complications. Estimated blood loss:                            None. Estimated Blood Loss:     Estimated blood loss: none. Impression:               - Small hiatal hernia.                           - The examination was otherwise normal.                           - Biopsies were taken with a cold forceps for                            evaluation of celiac disease. Recommendation:           - Patient has a contact number available for                            emergencies. The signs and symptoms of potential  delayed complications were discussed with the                            patient. Return to normal activities tomorrow.                            Written discharge instructions were provided to the                            patient.                           - Resume previous diet.                           - Continue present medications.                           - Await pathology results. Rachael Fee, MD 12/28/2021 2:17:53 PM This report has been signed electronically.

## 2021-12-28 NOTE — Progress Notes (Signed)
Told care giver [name] no photos, no face time permitted to send pt.'s mother.

## 2021-12-28 NOTE — Op Note (Signed)
[place] Patient Name: Zachary Thornton Procedure Date: 12/28/2021 1:50 PM MRN: [id number] Endoscopist: Milus Banister , MD Age: 56 Referring MD:  Date of Birth: Sep 25, 1966 Gender: Male Account #: [id number] Procedure:                Colonoscopy Indications:              Iron deficiency anemia Medicines:                Monitored Anesthesia Care Procedure:                Pre-Anesthesia Assessment:                           - Prior to the procedure, a History and Physical                            was performed, and patient medications and                            allergies were reviewed. The patient's tolerance of                            previous anesthesia was also reviewed. The risks                            and benefits of the procedure and the sedation                            options and risks were discussed with the patient.                            All questions were answered, and informed consent                            was obtained. Prior Anticoagulants: The patient has                            taken no previous anticoagulant or antiplatelet                            agents. ASA Grade Assessment: III - A patient with                            severe systemic disease. After reviewing the risks                            and benefits, the patient was deemed in                            satisfactory condition to undergo the procedure.                           After obtaining informed consent, the colonoscope  was passed under direct vision. Throughout the                            procedure, the patient's blood pressure, pulse, and                            oxygen saturations were monitored continuously. The                            Olympus CF-HQ190L ([id number]) Colonoscope was                            introduced through the anus and advanced to the the                            cecum, identified by appendiceal orifice  and                            ileocecal valve. The colonoscopy was performed                            without difficulty. The patient tolerated the                            procedure well. The quality of the bowel                            preparation was adequate. Scope In: 1:58:10 PM Scope Out: 2:09:14 PM Scope Withdrawal Time: 0 hours 6 minutes 29 seconds  Total Procedure Duration: 0 hours 11 minutes 4 seconds  Findings:                 The entire examined colon appeared normal on direct                            and retroflexion views. Complications:            No immediate complications. Estimated blood loss:                            None. Estimated Blood Loss:     Estimated blood loss: none. Impression:               - The entire examined colon is normal on direct and                            retroflexion views.                           - No polyps or cancers. Recommendation:           - EGD now.                           - Repeat colonoscopy in 10 years for screening  purposes. Milus Banister, MD 12/28/2021 2:11:10 PM This report has been signed electronically.

## 2021-12-28 NOTE — Progress Notes (Signed)
To PACU, VSS. Report to [email or web address] 

## 2021-12-28 NOTE — Patient Instructions (Signed)
YOU HAD AN ENDOSCOPIC PROCEDURE TODAY AT THE [facility]:   Refer to the procedure report that was given to you for any specific questions about what was found during the examination.  If the procedure report does not answer your questions, please call your gastroenterologist to clarify.  If you requested that your care partner not be given the details of your procedure findings, then the procedure report has been included in a sealed envelope for you to review at your convenience later.  YOU SHOULD EXPECT: Some feelings of bloating in the abdomen. Passage of more gas than usual.  Walking can help get rid of the air that was put into your GI tract during the procedure and reduce the bloating. If you had a lower endoscopy (such as a colonoscopy or flexible sigmoidoscopy) you may notice spotting of blood in your stool or on the toilet paper. If you underwent a bowel prep for your procedure, you may not have a normal bowel movement for a few days.  Please Note:  You might notice some irritation and congestion in your nose or some drainage.  This is from the oxygen used during your procedure.  There is no need for concern and it should clear up in a day or so.  SYMPTOMS TO REPORT IMMEDIATELY:   Following lower endoscopy (colonoscopy or flexible sigmoidoscopy):  Excessive amounts of blood in the stool  Significant tenderness or worsening of abdominal pains  Swelling of the abdomen that is new, acute  Fever of 100F or higher   Following upper endoscopy (EGD)  Vomiting of blood or coffee ground material  New chest pain or pain under the shoulder blades  Painful or persistently difficult swallowing  New shortness of breath  Fever of 100F or higher  Black, tarry-looking stools  For urgent or emergent issues, a gastroenterologist can be reached at any hour by calling [phone]. Do not use MyChart messaging for urgent concerns.    DIET:  We do recommend a small meal at first, but  then you may proceed to your regular diet.  Drink plenty of fluids but you should avoid alcoholic beverages for 24 hours.  ACTIVITY:  You should plan to take it easy for the rest of today and you should NOT DRIVE or use heavy machinery until tomorrow (because of the sedation medicines used during the test).    FOLLOW UP: Our staff will call the number listed on your records 48-72 hours following your procedure to check on you and address any questions or concerns that you may have regarding the information given to you following your procedure. If we do not reach you, we will leave a message.  We will attempt to reach you two times.  During this call, we will ask if you have developed any symptoms of COVID 19. If you develop any symptoms (ie: fever, flu-like symptoms, shortness of breath, cough etc.) before then, please call [phone].  If you test positive for Covid 19 in the 2 weeks post procedure, please call and report this information to us.    If any biopsies were taken you will be contacted by phone or by letter within the next 1-3 weeks.  Please call us at [phone] if you have not heard about the biopsies in 3 weeks.    SIGNATURES/CONFIDENTIALITY: You and/or your care partner have signed paperwork which will be entered into your electronic medical record.  These signatures attest to the fact that that the information above on   your After Visit Summary has been reviewed and is understood.  Full responsibility of the confidentiality of this discharge information lies with you and/or your care-partner. 

## 2021-12-28 NOTE — Progress Notes (Signed)
HPI: This is a man with prefvious IDA   ROS: complete GI ROS as described in HPI, all other review negative.  Constitutional:  No unintentional weight loss   Past Medical History:  Diagnosis Date   Back injury    Blood loss anemia    Dementia with behavioral disturbance    Duodenal ulcer hemorrhage    GI bleed    Headache    Seizures (HCC)    TBI (traumatic brain injury)     Past Surgical History:  Procedure Laterality Date   BRAIN SURGERY     CRANIOTOMY     Post TBI craniotomy and plate insertion   CSF SHUNT     ESOPHAGOGASTRODUODENOSCOPY N/A 11/01/2015   Procedure: ESOPHAGOGASTRODUODENOSCOPY (EGD);  Surgeon: Ruffin Frederick, MD;  Location: [facility];  Service: Gastroenterology;  Laterality: N/A;   ESOPHAGOGASTRODUODENOSCOPY (EGD) WITH PROPOFOL N/A 10/30/2015   Procedure: ESOPHAGOGASTRODUODENOSCOPY (EGD) WITH PROPOFOL;  Surgeon: Rachael Fee, MD;  Location: [facility];  Service: Endoscopy;  Laterality: N/A;    Current Outpatient Medications  Medication Sig Dispense Refill   atorvastatin (LIPITOR) 20 MG tablet Take 20 mg by mouth daily.     carbamazepine (CARBATROL) 300 MG 12 hr capsule TAKE ONE CAPSULE IN THE MORNING AND TWO CAPSULES IN THE EVENING. 90 capsule 1   Docusate Calcium (STOOL SOFTENER PO) Take 2 tablets by mouth at bedtime.     escitalopram (LEXAPRO) 20 MG tablet Take 20 mg by mouth daily.     ferrous sulfate 325 (65 FE) MG tablet Take 1 tablet (325 mg total) by mouth daily with breakfast. 30 tablet 3   lamoTRIgine (LAMICTAL) 100 MG tablet TAKE TWO TABLETS TWICE DAILY. 120 tablet 4   Multiple Vitamin (MULTIVITAMIN WITH MINERALS) TABS Take 1 tablet by mouth daily.     pantoprazole (PROTONIX) 40 MG tablet TAKE 1 TABLET TWICE DAILY. (Patient taking differently: Take 40 mg by mouth 2 (two) times daily.) 60 tablet 0   tamsulosin (FLOMAX) 0.4 MG CAPS capsule Take 1 capsule (0.4 mg total) by mouth daily after breakfast. 30 capsule 0   vitamin B-12 100  MCG tablet Take 1 tablet (100 mcg total) by mouth daily. 30 tablet 1   No current facility-administered medications for this visit.    Allergies as of 12/28/2021 - Review Complete 12/28/2021  Allergen Reaction Noted   Nuedexta [dextromethorphan-quinidine] Anaphylaxis 10/30/2015   Nsaids Other (See Comments) 11/02/2015    Family History  Problem Relation Age of Onset   Heart disease Father        MVR   Hypertension Mother    Stroke Maternal Aunt     Social History   Socioeconomic History   Marital status: Married    Spouse name: Not on file   Number of children: 1   Years of education: Not on file   Highest education level: Not on file  Occupational History   Occupation: Disabled  Tobacco Use   Smoking status: Never   Smokeless tobacco: Never  Vaping Use   Vaping Use: Never used  Substance and Sexual Activity   Alcohol use: No    Alcohol/week: 0.0 standard drinks   Drug use: No   Sexual activity: Yes  Other Topics Concern   Not on file  Social History Narrative   Lives with his mother.  Normally ambulates without assistance.  Right handed   One story home   Social Determinants of Health   Financial Resource Strain: Not on file  Food Insecurity: Not  on file  Transportation Needs: Not on file  Physical Activity: Not on file  Stress: Not on file  Social Connections: Not on file  Intimate Partner Violence: Not on file     Physical Exam: BP 102/66    Pulse 96    Temp 98.9 F (37.2 C) (Temporal)    Ht 5\' 8"  (1.727 m)    Wt 179 lb (81.2 kg)    SpO2 96%    BMI 27.22 kg/m  Constitutional: generally well-appearing Psychiatric: alert and oriented x3 Lungs: CTA bilaterally Heart: no MCR  Assessment and plan: 56 y.o. male with previous IDA, duodenal ulcer  For colonoscopy and EGD today  Care is appropriate for the ambulatory setting.  53, MD  Gastroenterology 12/28/2021, 1:02 PM

## 2022-01-01 ENCOUNTER — Telehealth: Payer: Self-pay | Admitting: *Deleted

## 2022-01-01 ENCOUNTER — Telehealth: Payer: Self-pay

## 2022-01-01 NOTE — Telephone Encounter (Signed)
No answer on first follow up call. Left message  °

## 2022-01-01 NOTE — Telephone Encounter (Signed)
Second attempt follow up call to pt, LM on VM ?

## 2022-01-02 ENCOUNTER — Encounter: Payer: Self-pay | Admitting: Gastroenterology

## 2022-01-04 DIAGNOSIS — R2689 Other abnormalities of gait and mobility: Secondary | ICD-10-CM | POA: Diagnosis not present

## 2022-01-04 DIAGNOSIS — D649 Anemia, unspecified: Secondary | ICD-10-CM | POA: Diagnosis not present

## 2022-01-04 DIAGNOSIS — R0981 Nasal congestion: Secondary | ICD-10-CM | POA: Diagnosis not present

## 2022-01-04 DIAGNOSIS — Z20822 Contact with and (suspected) exposure to covid-19: Secondary | ICD-10-CM | POA: Diagnosis not present

## 2022-01-04 DIAGNOSIS — S069X0S Unspecified intracranial injury without loss of consciousness, sequela: Secondary | ICD-10-CM | POA: Diagnosis not present

## 2022-01-04 DIAGNOSIS — E785 Hyperlipidemia, unspecified: Secondary | ICD-10-CM | POA: Diagnosis not present

## 2022-01-04 DIAGNOSIS — Z982 Presence of cerebrospinal fluid drainage device: Secondary | ICD-10-CM | POA: Diagnosis not present

## 2022-01-07 ENCOUNTER — Other Ambulatory Visit: Payer: Self-pay | Admitting: Neurology

## 2022-02-04 NOTE — Progress Notes (Signed)
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  He is accompanied by a family friend/caregiver whom he lives with supplements history.   UPDATE: Current medications: Lamotrigine 200 mg twice daily, carbamazepine XR 300 mg in a.m. and 600 mg in p.m (600mg  twice daily caused hyponatremia).   No seizures or falls since last visit.  [facility].  He has had a couple of falls over the past year.  CT head performed on 07/23/2021 following a fall was personally reviewed and revealed no acute abnormalities.  He needs more supervision.  He cannot blow dry his hair as he had the blow dryer to close to his scalp causing his hair to burn.  He also can no longer clean his ears himself as he once pushed the cerumen far into his canal with the Q-tip, requiring him to see his PCP to have it disimpacted. Once his toilet clogged. Instead of telling somebody, he took the used toilet paper from the toilet and threw them out of the window.  No agitation.  He has some sores on his scalp, presumably due to repeated scratching of his head.      HISTORY: He had a traumatic brain injury in 2012 after falling and hitting the back of his head on concrete.  He fractured his skull and sustained intracranial bleeding, requiring right craniectomy and VP shunt. [name]  He was in a coma for 6 weeks.  After 2 month hospitalization, he was discharged to inpatient rehab facility where he had to relearn how to walk, talk and feed himself.  His wife  was unable to care for him and he subsequently moved in with his mother, who is his guardian.  His wife and son live in [place].   He was hospitalized in December for GI bleed due to duodenal ulcer, requiring multiple blood transfusions.   He presented to the ED on 04/29/16 following an unwitnessed fall in which he hit his head.  He did not recall the event.  His mother heard him fall in the bathroom and found him on the floor with eyes rolled back, unresponsive with flexed posturing and shaking of both upper extremities, lasting about 2 minutes.  He had bowel and bladder incontinence.  He was slightly confused afterwards for about 10 minutes.  He did not exhibit foaming at the mouth or tongue biting.  CT of head was personally reviewed and showed chronic changes of traumatic brain injury with bifrontal and temporal encephalomalacia, as well as post surgical changes of right craniectomy with cranioplasty and left frontal approach VP shunt.  No evidence of hydrocephalus seen.  He was started on Keppra.  He has not had any recurrent spells.  He was found to have worsening anemia and received further transfusions by his PCP.  He does not drive.     He was admitted to [facility] from 03/23/17 to 03/24/17 for syncope.  He had a brief syncopal episode at church.  His face became pale.  He  did not exhibit seizure-like activity.  It was brief.  He was found to be hypotensive by EMS with a BP of 60/30 and was given IV fluids, which improved blood pressure.  He was not orthostatic in the hospital.  Work up in the hospital included head CT with no acute findings, EEG with right hemispheric slowing (which could be due to underlying physiologic abnormality from encephalomalacia and surgery), but no epileptiform discharges, and unremarkable EKG and troponins.  My suspicion is that this was not a seizure.   He had a seizure on 12/10/17, described as staring spell of unresponsiveness followed by generalized  tonic-clonic activity with urinary incontinence.  ED note mentions he was not taking Keppra, but his caregiver disputes this.  Carbamazepine level was 12.6.  Na was 131, K 4 and glucose 118.  UA was negative for infection.  He was loaded with Keppra and discharged on Keppra 1000mg  twice daily.  Since then, he has had increased irritability and verbally abusive   Past medication:  Keppra (irritability)   He has no history of seizures prior to 2017.  PAST MEDICAL HISTORY: Past Medical History:  Diagnosis Date   Back injury    Blood loss anemia    Blood transfusion without reported diagnosis    Dementia with behavioral disturbance    Depression    Duodenal ulcer hemorrhage    GI bleed    Headache    Seizures (HCC)    TBI (traumatic brain injury)     MEDICATIONS: Current Outpatient Medications on File Prior to Visit  Medication Sig Dispense Refill   atorvastatin (LIPITOR) 20 MG tablet Take 20 mg by mouth daily.     carbamazepine (CARBATROL) 300 MG 12 hr capsule TAKE ONE CAPSULE IN THE MORNING AND TWO CAPSULES IN THE EVENING. 90 capsule 1   Docusate Calcium (STOOL SOFTENER PO) Take 2 tablets by mouth at bedtime.     EPINEPHrine (EPIPEN 2-PAK) 0.3 mg/0.3 mL IJ SOAJ injection      escitalopram (LEXAPRO) 20 MG tablet      ferrous sulfate 325 (65 FE) MG tablet Take 1 tablet (325 mg total) by mouth daily with breakfast. 30 tablet 3   lamoTRIgine (LAMICTAL) 100 MG tablet TAKE TWO TABLETS BY MOUTH TWICE DAILY. 120 tablet 0   Multiple Vitamin (MULTIVITAMIN WITH MINERALS) TABS Take 1 tablet by mouth daily.     pantoprazole (PROTONIX) 40 MG tablet TAKE 1 TABLET TWICE DAILY. (Patient taking differently: Take 40 mg by mouth 2 (two) times daily.) 60 tablet 0   tamsulosin (FLOMAX) 0.4 MG CAPS capsule Take 1 capsule (0.4 mg total) by mouth daily after breakfast. 30 capsule 0   vitamin B-12 100 MCG tablet Take 1 tablet (100 mcg total) by mouth daily. 30 tablet 1   No current facility-administered  medications on file prior to visit.    ALLERGIES: Allergies  Allergen Reactions   Nuedexta [Dextromethorphan-Quinidine] Anaphylaxis    Lips began to swell   Nsaids Other (See Comments)    Due to GI bleeding    FAMILY HISTORY: Family History  Problem Relation Age of Onset   Hypertension Mother    Heart disease Father        MVR   Stroke Maternal Aunt    Colitis Neg Hx    Esophageal cancer Neg Hx    Rectal cancer Neg Hx    Stomach cancer Neg Hx       Objective:  Blood pressure 137/86, pulse 98, height 5\' 7"  (1.702  m), weight 173 lb (78.5 kg), SpO2 96 %. General: No acute distress.  Patient appears well-groomed.   Head:  Normocephalic/atraumatic Eyes:  Fundi examined but not visualized Neck: supple, no paraspinal tenderness, full range of motion Heart:  Regular rate and rhythm Lungs:  Clear to auscultation bilaterally Back: No paraspinal tenderness Neurological Exam: alert and oriented to person and place.  Speech fluent and not dysarthric.  Language intact.  End gaze horizontal nystagmus.  Otherwise, CN II-XII intact.  Bulk and tone normal.  Muscle strength 5/5 throughout.  Sensation to light touch intact.  Deep tendon reflexes 2+ throughout, toes downgoing.  Finger to nose testing intact.  broad-based spastic gait.  Romberg with mild sway.   Metta Clines, DO  CC: Crist Infante, MD

## 2022-02-05 ENCOUNTER — Encounter: Payer: Self-pay | Admitting: Neurology

## 2022-02-05 ENCOUNTER — Ambulatory Visit: Payer: PPO | Admitting: Neurology

## 2022-02-05 ENCOUNTER — Other Ambulatory Visit: Payer: Self-pay

## 2022-02-05 ENCOUNTER — Other Ambulatory Visit (INDEPENDENT_AMBULATORY_CARE_PROVIDER_SITE_OTHER): Payer: PPO

## 2022-02-05 VITALS — BP 137/86 | HR 98 | Ht 67.0 in | Wt 173.0 lb

## 2022-02-05 DIAGNOSIS — G40109 Localization-related (focal) (partial) symptomatic epilepsy and epileptic syndromes with simple partial seizures, not intractable, without status epilepticus: Secondary | ICD-10-CM | POA: Diagnosis not present

## 2022-02-05 DIAGNOSIS — Z79899 Other long term (current) drug therapy: Secondary | ICD-10-CM

## 2022-02-05 DIAGNOSIS — F02818 Dementia in other diseases classified elsewhere, unspecified severity, with other behavioral disturbance: Secondary | ICD-10-CM

## 2022-02-05 DIAGNOSIS — S069X9S Unspecified intracranial injury with loss of consciousness of unspecified duration, sequela: Secondary | ICD-10-CM

## 2022-02-05 LAB — CBC
HCT: 39.6 % (ref 39.0–52.0)
Hemoglobin: 13.4 g/dL (ref 13.0–17.0)
MCHC: 33.8 g/dL (ref 30.0–36.0)
MCV: 94.1 fl (ref 78.0–100.0)
Platelets: 226 10*3/uL (ref 150.0–400.0)
RBC: 4.21 Mil/uL — ABNORMAL LOW (ref 4.22–5.81)
RDW: 13 % (ref 11.5–15.5)
WBC: 4.6 10*3/uL (ref 4.0–10.5)

## 2022-02-05 MED ORDER — CARBAMAZEPINE ER 300 MG PO CP12: ORAL_CAPSULE | ORAL | 3 refills | Status: DC

## 2022-02-05 MED ORDER — LAMOTRIGINE 200 MG PO TABS: 200.0000 mg | ORAL_TABLET | Freq: Two times a day (BID) | ORAL | 3 refills | Status: DC

## 2022-02-05 NOTE — Patient Instructions (Signed)
Continue carbamazepine ER 300mg  in morning and 600mg  at night Continue lamotrigine 200mg  twice daily Check CBC and CMP Follow up one year

## 2022-02-06 ENCOUNTER — Other Ambulatory Visit: Payer: Self-pay | Admitting: Neurology

## 2022-02-06 LAB — COMPREHENSIVE METABOLIC PANEL
ALT: 14 U/L (ref 0–53)
AST: 16 U/L (ref 0–37)
Albumin: 4.8 g/dL (ref 3.5–5.2)
Alkaline Phosphatase: 111 U/L (ref 39–117)
BUN: 12 mg/dL (ref 6–23)
CO2: 34 mEq/L — ABNORMAL HIGH (ref 19–32)
Calcium: 9.8 mg/dL (ref 8.4–10.5)
Chloride: 100 mEq/L (ref 96–112)
Creatinine, Ser: 0.92 mg/dL (ref 0.40–1.50)
GFR: 93.53 mL/min (ref >60.00)
Glucose, Bld: 94 mg/dL (ref 70–99)
Potassium: 5.6 mEq/L — ABNORMAL HIGH (ref 3.5–5.1)
Sodium: 137 mEq/L (ref 135–145)
Total Bilirubin: 0.4 mg/dL (ref 0.2–1.2)
Total Protein: 7 g/dL (ref 6.0–8.3)

## 2022-02-06 NOTE — Progress Notes (Signed)
Per pt okay to give lab results to his roommate [name]. Labs given to patient as well as [name].

## 2022-02-13 ENCOUNTER — Ambulatory Visit: Payer: PPO | Admitting: Neurology

## 2022-02-14 ENCOUNTER — Telehealth: Payer: Self-pay | Admitting: Neurology

## 2022-02-14 ENCOUNTER — Other Ambulatory Visit: Payer: Self-pay

## 2022-02-14 MED ORDER — CARBAMAZEPINE ER 300 MG PO CP12: ORAL_CAPSULE | ORAL | 3 refills | Status: DC

## 2022-02-14 MED ORDER — LAMOTRIGINE 200 MG PO TABS: 200.0000 mg | ORAL_TABLET | Freq: Two times a day (BID) | ORAL | 3 refills | Status: DC

## 2022-02-14 NOTE — Telephone Encounter (Signed)
Called and stated that last time he was here they were going to change the dosage of the Lamictal and change the carbatrol to 90 days instead of 30 for insurance purposes.  They stated they talked to the pharmacy and it wasn't called in.

## 2022-03-31 ENCOUNTER — Other Ambulatory Visit: Payer: Self-pay

## 2022-03-31 ENCOUNTER — Emergency Department
Admission: EM | Admit: 2022-03-31 | Discharge: 2022-03-31 | Disposition: A | Discharge status: Home / Self Care | Payer: PPO | Attending: Emergency Medicine | Admitting: Emergency Medicine

## 2022-03-31 DIAGNOSIS — R569 Unspecified convulsions: Secondary | ICD-10-CM | POA: Diagnosis not present

## 2022-03-31 DIAGNOSIS — F0390 Unspecified dementia without behavioral disturbance: Secondary | ICD-10-CM | POA: Diagnosis not present

## 2022-03-31 DIAGNOSIS — R258 Other abnormal involuntary movements: Secondary | ICD-10-CM | POA: Insufficient documentation

## 2022-03-31 DIAGNOSIS — I959 Hypotension, unspecified: Secondary | ICD-10-CM | POA: Diagnosis not present

## 2022-03-31 DIAGNOSIS — R55 Syncope and collapse: Secondary | ICD-10-CM | POA: Diagnosis not present

## 2022-03-31 LAB — CBC WITH DIFFERENTIAL/PLATELET
Abs Immature Granulocytes: 0.02 10*3/uL (ref 0.00–0.07)
Basophils Absolute: 0.1 10*3/uL (ref 0.0–0.1)
Basophils Relative: 1 %
Eosinophils Absolute: 0.3 10*3/uL (ref 0.0–0.5)
Eosinophils Relative: 5 %
HCT: 39.5 % (ref 39.0–52.0)
Hemoglobin: 13.1 g/dL (ref 13.0–17.0)
Immature Granulocytes: 0 %
Lymphocytes Relative: 25 %
Lymphs Abs: 1.5 10*3/uL (ref 0.7–4.0)
MCH: 31.3 pg (ref 26.0–34.0)
MCHC: 33.2 g/dL (ref 30.0–36.0)
MCV: 94.3 fL (ref 80.0–100.0)
Monocytes Absolute: 0.6 10*3/uL (ref 0.1–1.0)
Monocytes Relative: 10 %
Neutro Abs: 3.5 10*3/uL (ref 1.7–7.7)
Neutrophils Relative %: 59 %
Platelets: 242 10*3/uL (ref 150–400)
RBC: 4.19 MIL/uL — ABNORMAL LOW (ref 4.22–5.81)
RDW: 11.8 % (ref 11.5–15.5)
WBC: 5.9 10*3/uL (ref 4.0–10.5)
nRBC: 0 % (ref 0.0–0.2)

## 2022-03-31 LAB — BASIC METABOLIC PANEL
Anion gap: 9 (ref 5–15)
BUN: 13 mg/dL (ref 6–20)
CO2: 23 mmol/L (ref 22–32)
Calcium: 8.5 mg/dL — ABNORMAL LOW (ref 8.9–10.3)
Chloride: 102 mmol/L (ref 98–111)
Creatinine, Ser: 0.86 mg/dL (ref 0.61–1.24)
GFR, Estimated: >60 mL/min (ref >60)
Glucose, Bld: 96 mg/dL (ref 70–99)
Potassium: 3.6 mmol/L (ref 3.5–5.1)
Sodium: 134 mmol/L — ABNORMAL LOW (ref 135–145)

## 2022-03-31 NOTE — ED Triage Notes (Signed)
B[place] ems from church. Pt had witnessed seizure. Pt has HX of TBI. No seizures in last two years until two weeks ago. Pt has been taking meds as ordered.  ? ?Initial BP 98/60 got ems ?90HR  ?120 BGL ?20G LH ?Pt found pale by EMS. Pt was CAO at the time. No trauma to the tongue. No fall. Pt did not urinate on himself.  ?

## 2022-03-31 NOTE — ED Provider Notes (Signed)
? ?[facility] ?Provider Note ? ? ? Event Date/Time  ? First MD Initiated Contact with Patient 03/31/22 1215   ?  (approximate) ? ? ?History  ? ?Seizures ? ? ?HPI ? ?Zachary Thornton is a 56 y.o. male with a history of TBI, seizure disorder, dementia who is [place] the ED from church due to a suspected seizure.  Patient reports being in his usual state of health, ate raisin bran for breakfast, and was sitting there asymptomatically when he had a loss of consciousness.  Bystanders noted that he seemed to have convulsive activity.  No urine incontinence.  No tongue injury.  When he woke up he quickly regained alertness and orientation. ? ?On arrival to the ED he denies any symptoms and states that he feels normal.  Reports compliance with his medications.  No alcohol or substance abuse.  No recent illness.  No new head trauma. ?  ? ? ?Physical Exam  ? ?Triage Vital Signs: ?ED Triage Vitals  ?Enc Vitals Group  ?   BP 03/31/22 1215 120/83  ?   Pulse Rate 03/31/22 1214 75  ?   Resp 03/31/22 1214 10  ?   Temp 03/31/22 1214 98 ?F (36.7 ?C)  ?   Temp Source 03/31/22 1214 Oral  ?   SpO2 03/31/22 1214 95 %  ?   Weight 03/31/22 1210 152 lb 1.9 oz (69 kg)  ?   Height 03/31/22 1210 5\' 7"  (1.702 m)  ?   Head Circumference --   ?   Peak Flow --   ?   Pain Score 03/31/22 1210 0  ?   Pain Loc --   ?   Pain Edu? --   ?   Excl. in GC? --   ? ? ?Most recent vital signs: ?Vitals:  ? 03/31/22 1215 03/31/22 1401  ?BP: 120/83 116/78  ?Pulse: 76 70  ?Resp: (!) 22 18  ?Temp:  98 ?F (36.7 ?C)  ?SpO2: 96% 96%  ? ? ? ?General: Awake, no distress.  ?CV:  Good peripheral perfusion.  Regular rate and rhythm ?Resp:  Normal effort.  Clear to auscultation bilaterally ?Abd:  No distention.  Soft nontender ?Other:  No rash.  Moist oral mucosa.  PERRL, EOMI. ? ? ?ED Results / Procedures / Treatments  ? ?Labs ?(all labs ordered are listed, but only abnormal results are displayed) ?Labs Reviewed  ?BASIC METABOLIC PANEL - Abnormal;  Notable for the following components:  ?    Result Value  ? Sodium 134 (*)   ? Calcium 8.5 (*)   ? All other components within normal limits  ?CBC WITH DIFFERENTIAL/PLATELET - Abnormal; Notable for the following components:  ? RBC 4.19 (*)   ? All other components within normal limits  ?URINALYSIS, ROUTINE W REFLEX MICROSCOPIC  ? ? ? ?EKG ? ?Interpreted by me ?Sinus rhythm rate of 75, normal axis.  First-degree AV block.  Normal QRS ST segments and T waves. ? ? ?RADIOLOGY ? ? ? ? ?PROCEDURES: ? ?Critical Care performed: No ? ?Procedures ? ? ?MEDICATIONS ORDERED IN ED: ?Medications - No data to display ? ? ?IMPRESSION / MDM / ASSESSMENT AND PLAN / ED COURSE  ?I reviewed the triage vital signs and the nursing notes. ?             ?               ? ?Differential diagnosis includes, but is not limited to, epilepsy, dehydration,  electrolyte abnormality, vagal episode, orthostatic syncope. ? ?Patient brought to the ED due to a loss of consciousness episode.  Possible seizure versus syncope.  He has no acute symptoms.  Vitals are unremarkable in the ED.  Serum labs unremarkable.  Reports compliance with his medications.  Does not require hospitalization and is suitable for continued follow-up with his neurology team. ? ? Considering the patient's symptoms, medical history, and physical examination today, I have low suspicion for ischemic stroke, intracranial hemorrhage, meningitis, encephalitis, carotid or vertebral dissection, venous sinus thrombosis, MS, intracranial hypertension, glaucoma, CRAO, CRVO, or temporal arteritis. ? ? ? ?  ? ? ?FINAL CLINICAL IMPRESSION(S) / ED DIAGNOSES  ? ?Final diagnoses:  ?Seizure-like activity (HCC)  ? ? ? ?Rx / DC Orders  ? ?ED Discharge Orders   ? ? None  ? ?  ? ? ? ?Note:  This document was prepared using Dragon voice recognition software and may include unintentional dictation errors. ?  ?Sharman Cheek, MD ?03/31/22 1410 ? ?

## 2022-03-31 NOTE — Discharge Instructions (Signed)
Your evaluation in the emergency department is unremarkable.  Please continue taking all of your home medications and follow-up with your neurologist for further evaluation of your symptoms. ?

## 2022-04-01 ENCOUNTER — Telehealth: Payer: Self-pay | Admitting: Neurology

## 2022-04-01 DIAGNOSIS — G40109 Localization-related (focal) (partial) symptomatic epilepsy and epileptic syndromes with simple partial seizures, not intractable, without status epilepticus: Secondary | ICD-10-CM

## 2022-04-01 NOTE — Telephone Encounter (Signed)
Per Caregiver ot already has a 90 day supply do we send in a 50 mg to add to it or use up the remainder of what they have?  ?

## 2022-04-01 NOTE — Telephone Encounter (Signed)
Pt c/o: seizure ?Missed medications?  No. ?Sleep deprived?  No. ?Alcohol intake?  No. ?Back to their usual baseline self?  Yes.  . If no, advise go to ER ?Current medications prescribed by Dr. Delice Lesch: Carbamazepine 300 In AM 600 mg, Lamictal 200 mg BID. ? ? ?Patient really tired this morning, But other wise doing okay.   ? ?Per Patient mother Daryll Doto, Faythe Ghee to speak to caregiver [facility].  ?Advise them both to please come fill out a updated DPR. One from 10/2021 do not have caregiver on it. ?

## 2022-04-01 NOTE — Telephone Encounter (Signed)
Had 2 seizure last 2 weeks. Has not had them in long time. No med change. Wanted to let jaffe know. He was sent to ER yesterday. Wants some advice. ?

## 2022-04-02 ENCOUNTER — Other Ambulatory Visit (INDEPENDENT_AMBULATORY_CARE_PROVIDER_SITE_OTHER): Payer: PPO

## 2022-04-02 DIAGNOSIS — G40109 Localization-related (focal) (partial) symptomatic epilepsy and epileptic syndromes with simple partial seizures, not intractable, without status epilepticus: Secondary | ICD-10-CM

## 2022-04-02 MED ORDER — LAMOTRIGINE 25 MG PO TABS: ORAL_TABLET | ORAL | 1 refills | Status: DC

## 2022-04-02 NOTE — Addendum Note (Signed)
Addended by: Leida Lauth on: 04/02/2022 11:49 AM ? ? Modules accepted: Orders ? ?

## 2022-04-03 NOTE — Progress Notes (Signed)
? ?NEUROLOGY FOLLOW UP OFFICE NOTE ? ?Zachary Thornton ?[id number] ? ?Assessment/Plan:  ? ?1  Symptomatic focal onset seizures with impaired consciousness as late effect of traumatic brain injury with breakthrough seizures x2.  No provoking factor known. 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 ?As he had been controlled for a while and there is no known provoking etiology for breakthrough seizure, will check CT head ?Still waiting on lamotrigine level.  Will also check carbamazepine level as well ?Continue lamotrigine 250mg  twice daily and carbamazepine XR 300mg  in AM and 600mg  in PM ?Follow up 4-5 months. ? ?Subjective:  ?Zachary LoosenScott Thornton is a 56 year old man with traumatic brain injury and history of GI bleed who follows up for seizure disorder.  He is accompanied by a family friend/caregiver whom he lives with supplements history. ?  ?UPDATE: ?Current medications: Lamotrigine 200 mg twice daily, carbamazepine XR 300 mg in a.m. and 600 mg in p.m (600mg  twice daily caused hyponatremia). ?  ?On 03/31/2022, he was sitting at the table when he suddenly stiffened up and became unresponsive.  He exhibited shaking activity for a couple of minutes.  No tongue biting, incontinence or postictal confusion.   Brought to the ED were CBC unremarkable and CMP showed borderline low Na 134 but otherwise unremarkable.  Discharged in stable condition.  He had a similar event several days prior.  He has not missed any medications, has not started new medications, has not been ill and denies change in sleep habits.  Denies headache or visual changes.  He had been a little more unsteady which has been attributed to possible dehydration.  He doesn't drink water but drinks a lot of sweet tea.  We had increased lamotrigine to 250mg  twice daily and sent out lab for lamotrigine level which is still pending. ?  ?  ?HISTORY: ?He had a traumatic brain injury in 2012 after falling and hitting the back  of his head on concrete.  He fractured his skull and sustained intracranial bleeding, requiring right craniectomy and VP shunt. [name].  He was in a coma for 6 weeks.  After 2 month hospitalization, he was discharged to inpatient rehab facility where he had to relearn how to walk, talk and feed himself.  His wife was unable to care for him and he subsequently moved in with his mother, who is his guardian.  His wife and son live in [place].   He was hospitalized in December for GI bleed due to duodenal ulcer, requiring multiple blood transfusions. ?  ?He presented to the ED on 04/29/16 following an unwitnessed fall in which he hit his head.  He did not recall the event.  His mother heard him fall in the bathroom and found him on the floor with eyes rolled back, unresponsive with flexed posturing and shaking of both upper extremities, lasting about 2 minutes.  He had bowel and bladder incontinence.  He was slightly confused afterwards for about 10 minutes.  He did not exhibit foaming at the mouth or tongue biting.  CT of head was personally reviewed and showed chronic changes of traumatic brain injury with bifrontal and temporal encephalomalacia, as well as post surgical changes of right craniectomy with cranioplasty and left frontal approach VP shunt.  No evidence of hydrocephalus seen.  He was started on Keppra.  He has not had any recurrent spells.  He was found to have worsening anemia and received further transfusions by his PCP.  He does not  drive.   ?  ?He was admitted to [facility] from 03/23/17 to 03/24/17 for syncope.  He had a brief syncopal episode at church.  His face became pale.  He did not exhibit seizure-like activity.  It was brief.  He was found to be hypotensive by EMS with a BP of 60/30 and was given IV fluids, which improved blood pressure.  He was not orthostatic in the hospital.  Work up in the hospital included head CT with no acute findings, EEG with right hemispheric slowing  (which could be due to underlying physiologic abnormality from encephalomalacia and surgery), but no epileptiform discharges, and unremarkable EKG and troponins.  My suspicion is that this was not a seizure. ?  ?He had a seizure on 12/10/17, described as staring spell of unresponsiveness followed by generalized tonic-clonic activity with urinary incontinence.  ED note mentions he was not taking Keppra, but his caregiver disputes this.  Carbamazepine level was 12.6.  Na was 131, K 4 and glucose 118.  UA was negative for infection.  He was loaded with Keppra and discharged on Keppra 1000mg  twice daily.  Since then, he has had increased irritability and verbally abusive, so Keppra was discontinued. ? ?In 2022, he has had increased falls and change in behavior.  CT head performed on 07/23/2021 following a fall was personally reviewed and revealed no acute abnormalities.  He needs more supervision.  He cannot blow dry his hair as he had the blow dryer to close to his scalp causing his hair to burn.  He also can no longer clean his ears himself as he once pushed the cerumen far into his canal with the Q-tip, requiring him to see his PCP to have it disimpacted. Once his toilet clogged. Instead of telling somebody, he took the used toilet paper from the toilet and threw them out of the window.  No agitation.  He has some sores on his scalp, presumably due to repeated scratching of his head.   ?  ?Past medication:  Keppra (irritability) ?  ?He has no history of seizures prior to 2017. ? ?PAST MEDICAL HISTORY: ?Past Medical History:  ?Diagnosis Date  ? Back injury   ? Blood loss anemia   ? Blood transfusion without reported diagnosis   ? Dementia with behavioral disturbance   ? Depression   ? Duodenal ulcer hemorrhage   ? GI bleed   ? Headache   ? Seizures (HCC)   ? TBI (traumatic brain injury)   ? ? ?MEDICATIONS: ?Current Outpatient Medications on File Prior to Visit  ?Medication Sig Dispense Refill  ? atorvastatin (LIPITOR)  20 MG tablet Take 20 mg by mouth daily.    ? carbamazepine (CARBATROL) 300 MG 12 hr capsule TAKE ONE CAPSULE IN THE MORNING AND TWO CAPSULES IN THE EVENING. 270 capsule 3  ? Cholecalciferol (VITAMIN D3) 100000 UNIT/GM POWD one capsule PO daily    ? Docusate Calcium (STOOL SOFTENER PO) Take 2 tablets by mouth at bedtime.    ? escitalopram (LEXAPRO) 20 MG tablet     ? ferrous sulfate 325 (65 FE) MG tablet Take 1 tablet (325 mg total) by mouth daily with breakfast. 30 tablet 3  ? lamoTRIgine (LAMICTAL) 200 MG tablet Take 1 tablet (200 mg total) by mouth 2 (two) times daily. 180 tablet 3  ? lamoTRIgine (LAMICTAL) 25 MG tablet Take 2 tabs (50 mg total  ) Twice daily  with the 200 mg twice  daily. 60 tablet 1  ?  Multiple Vitamin (MULTIVITAMIN WITH MINERALS) TABS Take 1 tablet by mouth daily.    ? Multiple Vitamin (MULTIVITAMIN) tablet Take 1 tablet by mouth daily.    ? pantoprazole (PROTONIX) 40 MG tablet TAKE 1 TABLET TWICE DAILY. (Patient taking differently: Take 40 mg by mouth 2 (two) times daily.) 60 tablet 0  ? tamsulosin (FLOMAX) 0.4 MG CAPS capsule Take 1 capsule (0.4 mg total) by mouth daily after breakfast. 30 capsule 0  ? vitamin B-12 100 MCG tablet Take 1 tablet (100 mcg total) by mouth daily. 30 tablet 1  ? zinc sulfate 220 (50 Zn) MG capsule Take 220 mg by mouth daily.    ? ?No current facility-administered medications on file prior to visit.  ? ? ?ALLERGIES: ?Allergies  ?Allergen Reactions  ? Nuedexta [Dextromethorphan-Quinidine] Anaphylaxis  ?  Lips began to swell  ? Nsaids Other (See Comments)  ?  Due to GI bleeding  ? ? ?FAMILY HISTORY: ?Family History  ?Problem Relation Age of Onset  ? Hypertension Mother   ? Heart disease Father   ?     MVR  ? Stroke Maternal Aunt   ? Colitis Neg Hx   ? Esophageal cancer Neg Hx   ? Rectal cancer Neg Hx   ? Stomach cancer Neg Hx   ? ? ?  ?Objective:  ?Blood pressure 123/78, pulse 90, height 5\' 8"  (1.727 m), weight 173 lb (78.5 kg), SpO2 94 %. ?General: No acute distress.   Patient appears well-groomed.   ?Head:  Normocephalic/atraumatic ?Eyes:  Fundi examined but not visualized ?Heart:  Regular rate and rhythm ? ?Neurological Exam: alert and oriented to person and place.

## 2022-04-04 ENCOUNTER — Other Ambulatory Visit: Payer: Self-pay

## 2022-04-04 ENCOUNTER — Ambulatory Visit (INDEPENDENT_AMBULATORY_CARE_PROVIDER_SITE_OTHER): Payer: PPO | Admitting: Neurology

## 2022-04-04 ENCOUNTER — Encounter: Payer: Self-pay | Admitting: Neurology

## 2022-04-04 ENCOUNTER — Other Ambulatory Visit (INDEPENDENT_AMBULATORY_CARE_PROVIDER_SITE_OTHER): Payer: PPO

## 2022-04-04 VITALS — BP 123/78 | HR 90 | Ht 68.0 in | Wt 173.0 lb

## 2022-04-04 DIAGNOSIS — Z79899 Other long term (current) drug therapy: Secondary | ICD-10-CM

## 2022-04-04 DIAGNOSIS — G40109 Localization-related (focal) (partial) symptomatic epilepsy and epileptic syndromes with simple partial seizures, not intractable, without status epilepticus: Secondary | ICD-10-CM | POA: Diagnosis not present

## 2022-04-04 DIAGNOSIS — G40919 Epilepsy, unspecified, intractable, without status epilepticus: Secondary | ICD-10-CM | POA: Diagnosis not present

## 2022-04-04 DIAGNOSIS — S069X5D Unspecified intracranial injury with loss of consciousness greater than 24 hours with return to pre-existing conscious level, subsequent encounter: Secondary | ICD-10-CM | POA: Diagnosis not present

## 2022-04-04 DIAGNOSIS — S069X9S Unspecified intracranial injury with loss of consciousness of unspecified duration, sequela: Secondary | ICD-10-CM | POA: Diagnosis not present

## 2022-04-04 DIAGNOSIS — F02818 Dementia in other diseases classified elsewhere, unspecified severity, with other behavioral disturbance: Secondary | ICD-10-CM

## 2022-04-04 LAB — LAMOTRIGINE LEVEL: Lamotrigine Lvl: 6 ug/mL (ref 2.5–15.0)

## 2022-04-04 NOTE — Patient Instructions (Addendum)
Still waiting on the Lamictal level.  I want to check the Tegretol level as well ?Check CT head ?Continue lamotrigine 250mg  twice daily and carbamazepine XR 300mg  in morning and 600mg  at night  ?Follow up 4 to 5 months. ?

## 2022-04-05 LAB — CARBAMAZEPINE LEVEL, TOTAL: Carbamazepine Lvl: 8.2 mg/L (ref 4.0–12.0)

## 2022-04-08 ENCOUNTER — Ambulatory Visit
Admission: RE | Admit: 2022-04-08 | Discharge: 2022-04-08 | Disposition: A | Discharge status: Home / Self Care | Payer: PPO | Source: Ambulatory Visit | Attending: Neurology | Admitting: Neurology

## 2022-04-08 ENCOUNTER — Other Ambulatory Visit: Payer: PPO

## 2022-04-08 DIAGNOSIS — R569 Unspecified convulsions: Secondary | ICD-10-CM | POA: Diagnosis not present

## 2022-04-08 DIAGNOSIS — G40919 Epilepsy, unspecified, intractable, without status epilepticus: Secondary | ICD-10-CM

## 2022-05-14 DIAGNOSIS — D649 Anemia, unspecified: Secondary | ICD-10-CM | POA: Diagnosis not present

## 2022-05-14 DIAGNOSIS — Z125 Encounter for screening for malignant neoplasm of prostate: Secondary | ICD-10-CM | POA: Diagnosis not present

## 2022-05-14 DIAGNOSIS — R634 Abnormal weight loss: Secondary | ICD-10-CM | POA: Diagnosis not present

## 2022-05-14 DIAGNOSIS — E559 Vitamin D deficiency, unspecified: Secondary | ICD-10-CM | POA: Diagnosis not present

## 2022-05-14 DIAGNOSIS — E785 Hyperlipidemia, unspecified: Secondary | ICD-10-CM | POA: Diagnosis not present

## 2022-05-15 ENCOUNTER — Other Ambulatory Visit: Payer: Self-pay | Admitting: Neurology

## 2022-05-21 ENCOUNTER — Other Ambulatory Visit: Payer: Self-pay | Admitting: Internal Medicine

## 2022-05-21 DIAGNOSIS — R569 Unspecified convulsions: Secondary | ICD-10-CM | POA: Diagnosis not present

## 2022-05-21 DIAGNOSIS — K279 Peptic ulcer, site unspecified, unspecified as acute or chronic, without hemorrhage or perforation: Secondary | ICD-10-CM | POA: Diagnosis not present

## 2022-05-21 DIAGNOSIS — E785 Hyperlipidemia, unspecified: Secondary | ICD-10-CM

## 2022-05-21 DIAGNOSIS — F419 Anxiety disorder, unspecified: Secondary | ICD-10-CM | POA: Diagnosis not present

## 2022-05-21 DIAGNOSIS — J309 Allergic rhinitis, unspecified: Secondary | ICD-10-CM | POA: Diagnosis not present

## 2022-05-21 DIAGNOSIS — S069X0S Unspecified intracranial injury without loss of consciousness, sequela: Secondary | ICD-10-CM | POA: Diagnosis not present

## 2022-05-21 DIAGNOSIS — E559 Vitamin D deficiency, unspecified: Secondary | ICD-10-CM | POA: Diagnosis not present

## 2022-05-21 DIAGNOSIS — Z982 Presence of cerebrospinal fluid drainage device: Secondary | ICD-10-CM | POA: Diagnosis not present

## 2022-05-21 DIAGNOSIS — Z0000 Encounter for general adult medical examination without abnormal findings: Secondary | ICD-10-CM | POA: Diagnosis not present

## 2022-05-21 DIAGNOSIS — D649 Anemia, unspecified: Secondary | ICD-10-CM | POA: Diagnosis not present

## 2022-05-21 DIAGNOSIS — R2689 Other abnormalities of gait and mobility: Secondary | ICD-10-CM | POA: Diagnosis not present

## 2022-05-21 DIAGNOSIS — T783XXA Angioneurotic edema, initial encounter: Secondary | ICD-10-CM | POA: Diagnosis not present

## 2022-05-28 ENCOUNTER — Ambulatory Visit
Admission: RE | Admit: 2022-05-28 | Discharge: 2022-05-28 | Disposition: A | Discharge status: Home / Self Care | Payer: No Typology Code available for payment source | Source: Ambulatory Visit | Attending: Internal Medicine | Admitting: Internal Medicine

## 2022-05-28 DIAGNOSIS — E785 Hyperlipidemia, unspecified: Secondary | ICD-10-CM

## 2022-06-11 ENCOUNTER — Ambulatory Visit: Payer: Medicare Other | Attending: Internal Medicine

## 2022-06-11 DIAGNOSIS — R293 Abnormal posture: Secondary | ICD-10-CM | POA: Diagnosis not present

## 2022-06-11 DIAGNOSIS — R260 Ataxic gait: Secondary | ICD-10-CM | POA: Insufficient documentation

## 2022-06-11 NOTE — Therapy (Signed)
OUTPATIENT PHYSICAL THERAPY NEURO EVALUATION   Patient Name: Zachary Thornton MRN: [id number] DOB:Aug 06, 1966, 56 y.o., male Today's Date: 06/11/2022   PCP: [name], MD REFERRING PROVIDER: [name], MD   PT End of Session - 06/11/22 1403     Visit Number 1    Number of Visits 17    Date for PT Re-Evaluation 08/06/22    Authorization Type Medicare    PT Start Time 1402    PT Stop Time 1445    PT Time Calculation (min) 43 min    Equipment Utilized During Treatment Gait belt    Activity Tolerance Patient tolerated treatment well    Behavior During Therapy WFL for tasks assessed/performed;Impulsive             Past Medical History:  Diagnosis Date   Back injury    Blood loss anemia    Blood transfusion without reported diagnosis    Dementia with behavioral disturbance (HCC)    Depression    Duodenal ulcer hemorrhage    GI bleed    Headache    Seizures (HCC)    TBI (traumatic brain injury) (HCC)    Past Surgical History:  Procedure Laterality Date   BRAIN SURGERY     CRANIOTOMY     Post TBI craniotomy and plate insertion   CSF SHUNT     ESOPHAGOGASTRODUODENOSCOPY N/A 11/01/2015   Procedure: ESOPHAGOGASTRODUODENOSCOPY (EGD);  Surgeon: Ruffin Frederick, MD;  Location: [facility];  Service: Gastroenterology;  Laterality: N/A;   ESOPHAGOGASTRODUODENOSCOPY (EGD) WITH PROPOFOL N/A 10/30/2015   Procedure: ESOPHAGOGASTRODUODENOSCOPY (EGD) WITH PROPOFOL;  Surgeon: Rachael Fee, MD;  Location: [facility];  Service: Endoscopy;  Laterality: N/A;   Patient Active Problem List   Diagnosis Date Noted   History of duodenal ulcer with hemorrhage 03/14/2021   Severe sepsis (HCC) 03/14/2021   Pneumonia 03/14/2021   Sepsis (HCC) 03/14/2021   Syncope 03/23/2017   Bleeding duodenal ulcer    Hypokalemia    Lactic acidosis    Duodenal ulcer hemorrhage 10/31/2015   Urinary retention 10/30/2015   Acute blood loss anemia 10/29/2015   Hypotension 10/29/2015   Syncope  and collapse 10/29/2015   Traumatic brain injury with depressed frontal skull fracture (HCC) 09/07/2012   Cognitive deficit as late effect of traumatic brain injury (HCC) 09/07/2012   Episodic dyscontrol syndrome 09/07/2012    ONSET DATE: TBI in 2012  REFERRING DIAG: R26.89 (ICD-10-CM) - Other abnormalities of gait and mobility  THERAPY DIAG:  Ataxic gait  Abnormal posture  Rationale for Evaluation and Treatment Rehabilitation  SUBJECTIVE:  SUBJECTIVE STATEMENT: Patient reports doing well today.  Pt accompanied by:  roommate  PERTINENT HISTORY: TBI, GI bleed, seizures  PAIN:  Are you having pain? No  PRECAUTIONS: Fall  WEIGHT BEARING RESTRICTIONS No  FALLS: Has patient fallen in last 6 months? Yes. Number of falls 2 that are reported; numerous near falls/ day   LIVING ENVIRONMENT: Lives with:  roommate ([name])  Lives in: House/apartment Stairs: Yes: External: 5 steps; bilateral but cannot reach both Has following equipment at home: Grab bars  PLOF: Independent with basic ADLs and Independent with household mobility without device  PATIENT GOALS "to walk better and have better balance"   OBJECTIVE:   COGNITION: Overall cognitive status: History of cognitive impairments - at baseline   SENSATION: WFL  COORDINATION: Impaired and poor perception of impairment   MUSCLE TONE: RLE: Hypertonic   POSTURE: rounded shoulders, forward head, flexed trunk , and R shoulder elevated  LOWER EXTREMITY MMT:    MMT Right Eval Left Eval  Hip flexion 5 5  Hip extension    Hip abduction 5 5  Hip adduction 5 5  Hip internal rotation    Hip external rotation    Knee flexion 5 5  Knee extension 5 5  Ankle dorsiflexion 5 5  Ankle plantarflexion 5 5  Ankle inversion    Ankle  eversion    (Blank rows = not tested)  BED MOBILITY:  Patient reports no difficulty   TRANSFERS: Assistive device utilized: None  Sit to stand: SBA Stand to sit: SBA Chair to chair: SBA  STAIRS:  Level of Assistance: Min A  Stair Negotiation Technique: Step to Pattern Alternating Pattern  with Single Rail on Right  Number of Stairs: 12   Height of Stairs: 6   GAIT: Gait pattern: step through pattern, decreased arm swing- Right, decreased hip/knee flexion- Right, decreased hip/knee flexion- Left, circumduction- Right, Right foot flat, lateral lean- Left, decreased trunk rotation, wide BOS, abducted- Right, poor foot clearance- Right, and poor foot clearance- Left Distance walked: 200' Assistive device utilized:  walking stick  and None Level of assistance: CGA and Min A Comments: Trial of walking stick- patient typically does not use AD   FUNCTIONAL TESTs:   [facility] PT Assessment - 06/11/22 0001       Standardized Balance Assessment   Standardized Balance Assessment Five Times Sit to Stand   19.63s, no UE + CGA     Functional Gait  Assessment   Gait assessed  Yes    Gait Level Surface Walks 20 ft, slow speed, abnormal gait pattern, evidence for imbalance or deviates 10-15 in outside of the 12 in walkway width. Requires more than 7 sec to ambulate 20 ft.    Change in Gait Speed Makes only minor adjustments to walking speed, or accomplishes a change in speed with significant gait deviations, deviates 10-15 in outside the 12 in walkway width, or changes speed but loses balance but is able to recover and continue walking.    Gait with Horizontal Head Turns Performs head turns with moderate changes in gait velocity, slows down, deviates 10-15 in outside 12 in walkway width but recovers, can continue to walk.    Gait with Vertical Head Turns Performs task with moderate change in gait velocity, slows down, deviates 10-15 in outside 12 in walkway width but recovers, can continue to walk.     Gait and Pivot Turn Cannot turn safely, requires assistance to turn and stop.    Step Over Obstacle Cannot  perform without assistance.    Gait with Narrow Base of Support Ambulates less than 4 steps heel to toe or cannot perform without assistance.    Gait with Eyes Closed Cannot walk 20 ft without assistance, severe gait deviations or imbalance, deviates greater than 15 in outside 12 in walkway width or will not attempt task.    Ambulating Backwards Cannot walk 20 ft without assistance, severe gait deviations or imbalance, deviates greater than 15 in outside 12 in walkway width or will not attempt task.    Steps Alternating feet, must use rail.    Total Score 6            Activities-specific Balance Confidence Scale:  Score: 85% (patient); 55% (roommate)   TODAY'S TREATMENT:  -115' trialing walking stick with CGA/MinA   PATIENT EDUCATION: Education details: PT POC, OM results, use of AD Person educated: Patient and Roommate Education method: Medical illustrator Education comprehension: verbalized understanding and needs further education   HOME EXERCISE PROGRAM: To be provided     GOALS: Goals reviewed with patient? Yes  SHORT TERM GOALS: Target date: 07/09/2022  Pt will be independent with initial HEP for improved balance and functional strength   Baseline: to be provided Goal status: INITIAL  2.  Pt will improve 5x STS to </= 16 sec to demo improved functional LE strength and balance   Baseline: 19.63s with no UE, CGA Goal status: INITIAL  3.  Pt will improve FGA to >/= 11/30 to demonstrate improved balance and reduced fall risk  Baseline: 6/30 Goal status: INITIAL  4.  Patient will be compliant using LRAD at least 75% of the time at home  Baseline: patient does not use AD at baseline Goal status: INITIAL   LONG TERM GOALS: Target date: 08/06/2022  Pt will be independent with final HEP for improved balance  Baseline: to be provided Goal  status: INITIAL  2.  Pt will improve FGA to >/= 16/30 to demonstrate improved balance and reduced fall risk  Baseline: 6/30 Goal status: INITIAL  3.  Pt will improve 5x STS to </= 13 sec to demo improved functional LE strength and balance   Baseline: 19.36S no UE, CGA Goal status: INITIAL  4.  Patient will be compliant with use of LRAD as recommended 100% of the time Baseline: patient does not use an AD Goal status: INITIAL  5.  Patient will experience <3 near-falls/day Baseline: >10 near falls/day Goal status: INITIAL  ASSESSMENT:  CLINICAL IMPRESSION: Patient is a 56 y.o. male who was seen today for physical therapy evaluation and treatment for instability and gait abnormalities due to h/o of TBI. Patient demonstrates increased fall risk as noted by score of 6/30 on  Functional Gait Assessment.   <22/30 = predictive of falls, <20/30 = fall in 6 months, <18/30 = predictive of falls in PD MCID: 5 points stroke population, 4 points geriatric population (ANPTA Core Set of Outcome Measures for Adults with Neurologic Conditions, 2018) . Activities-specific Balance Confidence Scale:  Score: 84% patient (55% roommates perception)  Increased risk of falls in community-dwelling, older adults <80% (79.89%)  0% = no confidence - 100% = complete confidence (ANPTA Core Set of Outcome Measures for Adults with Neurologic Conditions, 2018). Patient has a profoundly poor perception of his mobility rating his confidence at >80% that he will not fall given different conditions- however, he scored a 6/30 on the FGA indicating that he is at a very high fall risk for simple ambulatory tasks.  Five times Sit to Stand Test (FTSS) TIME: __19.63____ (in seconds) Times > 13.6 seconds is associated with increased disability and morbidity (Guralnik, 2000). Patient will benefit from skilled PT services in order to address the impairments above and minimize his risk for falling.    OBJECTIVE IMPAIRMENTS Abnormal  gait, decreased balance, decreased cognition, decreased coordination, decreased endurance, decreased knowledge of condition, decreased knowledge of use of DME, difficulty walking, decreased ROM, decreased safety awareness, and impaired tone.   ACTIVITY LIMITATIONS carrying, lifting, bending, standing, squatting, stairs, transfers, reach over head, and locomotion level  PARTICIPATION LIMITATIONS: meal prep, cleaning, laundry, driving, shopping, community activity, and yard work  PERSONAL FACTORS Behavior pattern, Past/current experiences, Time since onset of injury/illness/exacerbation, and 1-2 comorbidities: GI bleed, seizures  are also affecting patient's functional outcome.   REHAB POTENTIAL: Fair time since onset  CLINICAL DECISION MAKING: Evolving/moderate complexity  EVALUATION COMPLEXITY: Moderate  PLAN: PT FREQUENCY: 1-2x/week  PT DURATION: 8 weeks  PLANNED INTERVENTIONS: Therapeutic exercises, Therapeutic activity, Neuromuscular re-education, Balance training, Gait training, Patient/Family education, Joint mobilization, Stair training, Vestibular training, Visual/preceptual remediation/compensation, Orthotic/Fit training, DME instructions, Aquatic Therapy, Cognitive remediation, Wheelchair mobility training, Cryotherapy, Moist heat, Manual therapy, and Re-evaluation  PLAN FOR NEXT SESSION: provide HEP, assess berg vs TUG, gait c walking stick vs cane    Westley Foots, PT Westley Foots, PT, DPT, CBIS  06/11/2022, 2:55 PM

## 2022-06-18 ENCOUNTER — Ambulatory Visit: Payer: Medicare Other

## 2022-06-18 DIAGNOSIS — R260 Ataxic gait: Secondary | ICD-10-CM | POA: Diagnosis not present

## 2022-06-18 DIAGNOSIS — R293 Abnormal posture: Secondary | ICD-10-CM | POA: Diagnosis not present

## 2022-06-18 NOTE — Therapy (Signed)
OUTPATIENT PHYSICAL THERAPY TREATMENT NOTE   Patient Name: DRAKKAR SERGIO MRN: [id number] DOB:07/04/1966, 56 y.o., male Today's Date: 06/18/2022  PCP: [name], MD REFERRING PROVIDER: [name], MD  END OF SESSION:   PT End of Session - 06/18/22 1320     Visit Number 2    Number of Visits 17    Date for PT Re-Evaluation 08/06/22    Authorization Type Medicare    PT Start Time 1318    PT Stop Time 1357    PT Time Calculation (min) 39 min    Equipment Utilized During Treatment Gait belt    Activity Tolerance Patient tolerated treatment well    Behavior During Therapy WFL for tasks assessed/performed;Impulsive             Past Medical History:  Diagnosis Date   Back injury    Blood loss anemia    Blood transfusion without reported diagnosis    Dementia with behavioral disturbance (HCC)    Depression    Duodenal ulcer hemorrhage    GI bleed    Headache    Seizures (HCC)    TBI (traumatic brain injury) (HCC)    Past Surgical History:  Procedure Laterality Date   BRAIN SURGERY     CRANIOTOMY     Post TBI craniotomy and plate insertion   CSF SHUNT     ESOPHAGOGASTRODUODENOSCOPY N/A 11/01/2015   Procedure: ESOPHAGOGASTRODUODENOSCOPY (EGD);  Surgeon: Ruffin Frederick, MD;  Location: [facility];  Service: Gastroenterology;  Laterality: N/A;   ESOPHAGOGASTRODUODENOSCOPY (EGD) WITH PROPOFOL N/A 10/30/2015   Procedure: ESOPHAGOGASTRODUODENOSCOPY (EGD) WITH PROPOFOL;  Surgeon: Rachael Fee, MD;  Location: [facility];  Service: Endoscopy;  Laterality: N/A;   Patient Active Problem List   Diagnosis Date Noted   History of duodenal ulcer with hemorrhage 03/14/2021   Severe sepsis (HCC) 03/14/2021   Pneumonia 03/14/2021   Sepsis (HCC) 03/14/2021   Syncope 03/23/2017   Bleeding duodenal ulcer    Hypokalemia    Lactic acidosis    Duodenal ulcer hemorrhage 10/31/2015   Urinary retention 10/30/2015   Acute blood loss anemia 10/29/2015   Hypotension  10/29/2015   Syncope and collapse 10/29/2015   Traumatic brain injury with depressed frontal skull fracture (HCC) 09/07/2012   Cognitive deficit as late effect of traumatic brain injury (HCC) 09/07/2012   Episodic dyscontrol syndrome 09/07/2012    REFERRING DIAG: R26.89 (ICD-10-CM) - Other abnormalities of gait and mobility  THERAPY DIAG:  Ataxic gait  Abnormal posture  Rationale for Evaluation and Treatment Rehabilitation  PERTINENT HISTORY: TBI, GI bleed, seizures  PRECAUTIONS: fall  SUBJECTIVE: Patient reports doing well- denies fall or near falls, however- patient is inconsistent historian at times.   PAIN:  Are you having pain? No   TODAY'S TREATMENT:   [facility] PT Assessment - 06/18/22 0001       Standardized Balance Assessment   Standardized Balance Assessment Timed Up and Go Test      Berg Balance Test   Sit to Stand Able to stand without using hands and stabilize independently    Standing Unsupported Able to stand 2 minutes with supervision    Sitting with Back Unsupported but Feet Supported on Floor or Stool Able to sit 2 minutes under supervision    Stand to Sit Sits safely with minimal use of hands    Transfers Able to transfer safely, definite need of hands    Standing Unsupported with Eyes Closed Able to stand 10 seconds with supervision  Standing Unsupported with Feet Together Able to place feet together independently and stand for 1 minute with supervision    From Standing, Reach Forward with Outstretched Arm Reaches forward but needs supervision    From Standing Position, Pick up Object from Floor Unable to pick up and needs supervision    From Standing Position, Turn to Look Behind Over each Shoulder Turn sideways only but maintains balance    Turn 360 Degrees Able to turn 360 degrees safely but slowly    Standing Unsupported, Alternately Place Feet on Step/Stool Able to complete >2 steps/needs minimal assist    Standing Unsupported, One Foot in Front  Able to take small step independently and hold 30 seconds    Standing on One Leg Tries to lift leg/unable to hold 3 seconds but remains standing independently    Total Score 33      Timed Up and Go Test   Normal TUG (seconds) 16.76    Cognitive TUG (seconds) 20.17            Gait: -230' CGA + walking pole (emphasis on increased reciprocal arm swing, use of AD and deceased shoulder high guard)  -115' CGA- no AD (emphasis on appropriate BOS, increased R knee flexion, appropriate gait speed)  NMR:  -modified D1 B LE to 4" box with dot as target (BUE -> U UE-> no UE support; required up to ModA to maintain balance with poor awareness of LOB)  -SciFit hills level 3 x8 mins B UE/LE      PATIENT EDUCATION: Education details: OM results, safety awareness, slow/controlled movements Person educated: Patient and Roommate Education method: Medical illustrator Education comprehension: verbalized understanding and needs further education     HOME EXERCISE PROGRAM: To be provided      GOALS: Goals reviewed with patient? Yes   SHORT TERM GOALS: Target date: 07/09/2022   Pt will be independent with initial HEP for improved balance and functional strength   Baseline: to be provided Goal status: INITIAL   2.  Pt will improve 5x STS to </= 16 sec to demo improved functional LE strength and balance    Baseline: 19.63s with no UE, CGA Goal status: INITIAL   3.  Pt will improve FGA to >/= 11/30 to demonstrate improved balance and reduced fall risk   Baseline: 6/30 Goal status: INITIAL   4.  Patient will be compliant using LRAD at least 75% of the time at home  Baseline: patient does not use AD at baseline Goal status: INITIAL     LONG TERM GOALS: Target date: 08/06/2022   Pt will be independent with final HEP for improved balance  Baseline: to be provided Goal status: INITIAL   2.  Pt will improve FGA to >/= 16/30 to demonstrate improved balance and reduced fall  risk   Baseline: 6/30 Goal status: INITIAL   3.  Pt will improve 5x STS to </= 13 sec to demo improved functional LE strength and balance   Baseline: 19.36S no UE, CGA Goal status: INITIAL   4.  Patient will be compliant with use of LRAD as recommended 100% of the time Baseline: patient does not use an AD Goal status: INITIAL   5.  Patient will experience <3 near-falls/day Baseline: >10 near falls/day Goal status: INITIAL   ASSESSMENT:   CLINICAL IMPRESSION: Patient seen for skilled PT session with emphasis on gait training and safety awareness. He remains impulsive and with minimal carryover from tasks. Patient demonstrated increased fall risk  noted by score of 33/56 on the [facility] Scale.  <45/56 = fall risk, <42/56 = predictive of recurrent falls, <40/56 = 100% fall risk  >41 = independent, 21-40 = assistive device, 0-20 = wheelchair level  MDC 6.9 (4 pts 45-56, 5 pts 35-44, 7 pts 25-34) (ANPTA Core Set of Outcome Measures for Adults with Neurologic Conditions, 2018). Patient completed the Timed Up and Go test (TUG) in 16.76 seconds.  Geriatrics: need for further assessment of fall risk: ? 12 sec; Recurrent falls: > 15 sec; Vestibular Disorders fall risk: > 15 sec; Parkinson's Disease fall risk: > 16 sec ([email or web address], 2023). Patient with >10% difference between TUG normal and TUG Cog indicating a limited ability to dual task safely. Discussed with patient, but he will require further education and reinforcement of this. Patient noted to have decreased standing balance primarily when standing on R LE to advance L LE. He will benefit from further skilled PT services in order to progress safe dynamic balance. Continue POC.     OBJECTIVE IMPAIRMENTS Abnormal gait, decreased balance, decreased cognition, decreased coordination, decreased endurance, decreased knowledge of condition, decreased knowledge of use of DME, difficulty walking, decreased ROM, decreased safety awareness, and  impaired tone.    ACTIVITY LIMITATIONS carrying, lifting, bending, standing, squatting, stairs, transfers, reach over head, and locomotion level   PARTICIPATION LIMITATIONS: meal prep, cleaning, laundry, driving, shopping, community activity, and yard work   PERSONAL FACTORS Behavior pattern, Past/current experiences, Time since onset of injury/illness/exacerbation, and 1-2 comorbidities: GI bleed, seizures  are also affecting patient's functional outcome.    REHAB POTENTIAL: Fair time since onset   CLINICAL DECISION MAKING: Evolving/moderate complexity   EVALUATION COMPLEXITY: Moderate   PLAN: PT FREQUENCY: 1-2x/week   PT DURATION: 8 weeks   PLANNED INTERVENTIONS: Therapeutic exercises, Therapeutic activity, Neuromuscular re-education, Balance training, Gait training, Patient/Family education, Joint mobilization, Stair training, Vestibular training, Visual/preceptual remediation/compensation, Orthotic/Fit training, DME instructions, Aquatic Therapy, Cognitive remediation, Wheelchair mobility training, Cryotherapy, Moist heat, Manual therapy, and Re-evaluation   PLAN FOR NEXT SESSION: provide HEP, address gait mechanics, pre gait, SAFETY AWARENESS, balance strategies   Westley Foots, PT Westley Foots, PT, DPT, CBIS  06/18/2022, 2:01 PM

## 2022-06-20 ENCOUNTER — Ambulatory Visit: Payer: Medicare Other

## 2022-06-20 DIAGNOSIS — R293 Abnormal posture: Secondary | ICD-10-CM

## 2022-06-20 DIAGNOSIS — R260 Ataxic gait: Secondary | ICD-10-CM | POA: Diagnosis not present

## 2022-06-20 NOTE — Therapy (Signed)
OUTPATIENT PHYSICAL THERAPY TREATMENT NOTE   Patient Name: Zachary Thornton MRN: [id number] DOB:[birth year], 56 y.o., male Today's Date: 06/20/2022  PCP: [name], MD REFERRING PROVIDER: [name], MD  END OF SESSION:   PT End of Session - 06/20/22 1229     Visit Number 3    Number of Visits 17    Date for PT Re-Evaluation 08/06/22    Authorization Type Medicare    PT Start Time 1230    PT Stop Time 1311    PT Time Calculation (min) 41 min    Equipment Utilized During Treatment Gait belt    Activity Tolerance Patient tolerated treatment well    Behavior During Therapy WFL for tasks assessed/performed;Impulsive             Past Medical History:  Diagnosis Date   Back injury    Blood loss anemia    Blood transfusion without reported diagnosis    Dementia with behavioral disturbance (HCC)    Depression    Duodenal ulcer hemorrhage    GI bleed    Headache    Seizures (HCC)    TBI (traumatic brain injury) (HCC)    Past Surgical History:  Procedure Laterality Date   BRAIN SURGERY     CRANIOTOMY     Post TBI craniotomy and plate insertion   CSF SHUNT     ESOPHAGOGASTRODUODENOSCOPY N/A 11/01/2015   Procedure: ESOPHAGOGASTRODUODENOSCOPY (EGD);  Surgeon: Ruffin Frederick, MD;  Location: [facility];  Service: Gastroenterology;  Laterality: N/A;   ESOPHAGOGASTRODUODENOSCOPY (EGD) WITH PROPOFOL N/A 10/30/2015   Procedure: ESOPHAGOGASTRODUODENOSCOPY (EGD) WITH PROPOFOL;  Surgeon: Rachael Fee, MD;  Location: [facility];  Service: Endoscopy;  Laterality: N/A;   Patient Active Problem List   Diagnosis Date Noted   History of duodenal ulcer with hemorrhage 03/14/2021   Severe sepsis (HCC) 03/14/2021   Pneumonia 03/14/2021   Sepsis (HCC) 03/14/2021   Syncope 03/23/2017   Bleeding duodenal ulcer    Hypokalemia    Lactic acidosis    Duodenal ulcer hemorrhage 10/31/2015   Urinary retention 10/30/2015   Acute blood loss anemia 10/29/2015   Hypotension  10/29/2015   Syncope and collapse 10/29/2015   Traumatic brain injury with depressed frontal skull fracture (HCC) 09/07/2012   Cognitive deficit as late effect of traumatic brain injury (HCC) 09/07/2012   Episodic dyscontrol syndrome 09/07/2012    REFERRING DIAG: R26.89 (ICD-10-CM) - Other abnormalities of gait and mobility  THERAPY DIAG:  Ataxic gait  Abnormal posture  Rationale for Evaluation and Treatment Rehabilitation  PERTINENT HISTORY: TBI, GI bleed, seizures  PRECAUTIONS: fall  SUBJECTIVE: Patient reports doing well- continues to deny falls/near falls.   PAIN:  Are you having pain? No   TODAY'S TREATMENT:    Gait: -115' x3 with B walking poles + spv with emphasis on reciprocal arm swing. Able to coordinate this ~80% of the time. B walking poles forced narrower BOS while ambulating and decreased R LE circumduction  NMR:  -standing stepping strategy to colored dots with CGA (increased difficulty crossing midline with LE) -standing on foam tapping B LE to dots (increased posterior LOB with decreased awareness vs ability to correct balance)  -in the // bars:  -ant lunge onto Bosu ball B UE -> U UE  -lateral lunge onto Bosu ball B UE -> U UE (when stepping with L LE only, unable to do U UE with R LE stepping)   -rockerboard A/P static standing balance (required MinA with poor balance strategies and  awareness of LOB despite use of mirror   -rockerboard lateral static standing balance with improved ability to maintain balance, but still with difficulty  -SciFit hills x10 mins B UE/LE level 3  -provided HEP (see below)    PATIENT EDUCATION: Education details: safety awareness, slow/controlled movements, HEP WITH SUPERVISION Person educated: Patient and Roommate Education method: Medical illustrator Education comprehension: verbalized understanding and needs further education     HOME EXERCISE PROGRAM: Access Code: [email or web address] URL:  [email or web address] Date: 06/20/2022 Prepared by: Merry Lofty  Exercises - Sit to Stand with Counter Support  - 1 x daily - 7 x weekly - 3 sets - 10 reps - Mini Squat with Counter Support  - 1 x daily - 7 x weekly - 3 sets - 10 reps - Standing March with Counter Support  - 1 x daily - 7 x weekly - 3 sets - 10 reps - Heel Raises with Counter Support  - 1 x daily - 7 x weekly - 3 sets - 10 reps - Heel Toe Raises with Counter Support  - 1 x daily - 7 x weekly - 3 sets - 10 reps     GOALS: Goals reviewed with patient? Yes   SHORT TERM GOALS: Target date: 07/09/2022   Pt will be independent with initial HEP for improved balance and functional strength   Baseline: to be provided Goal status: INITIAL   2.  Pt will improve 5x STS to </= 16 sec to demo improved functional LE strength and balance    Baseline: 19.63s with no UE, CGA Goal status: INITIAL   3.  Pt will improve FGA to >/= 11/30 to demonstrate improved balance and reduced fall risk   Baseline: 6/30 Goal status: INITIAL   4.  Patient will be compliant using LRAD at least 75% of the time at home  Baseline: patient does not use AD at baseline Goal status: INITIAL     LONG TERM GOALS: Target date: 08/06/2022   Pt will be independent with final HEP for improved balance  Baseline: to be provided Goal status: INITIAL   2.  Pt will improve FGA to >/= 16/30 to demonstrate improved balance and reduced fall risk   Baseline: 6/30 Goal status: INITIAL   3.  Pt will improve 5x STS to </= 13 sec to demo improved functional LE strength and balance   Baseline: 19.36S no UE, CGA Goal status: INITIAL   4.  Patient will be compliant with use of LRAD as recommended 100% of the time Baseline: patient does not use an AD Goal status: INITIAL   5.  Patient will experience <3 near-falls/day Baseline: >10 near falls/day Goal status: INITIAL   ASSESSMENT:   CLINICAL IMPRESSION: Patient seen for skilled PT session  with emphasis on balance and gait re-training. He remains impulsive and with poor awareness of his deficits even with external cuing. Improving ability to follow single-step cues to complete therapeutic tasks. Would benefit from further balance strategy re-training and other interventions that highlight LOB to improve patients awareness. Continue POC.     OBJECTIVE IMPAIRMENTS Abnormal gait, decreased balance, decreased cognition, decreased coordination, decreased endurance, decreased knowledge of condition, decreased knowledge of use of DME, difficulty walking, decreased ROM, decreased safety awareness, and impaired tone.    ACTIVITY LIMITATIONS carrying, lifting, bending, standing, squatting, stairs, transfers, reach over head, and locomotion level   PARTICIPATION LIMITATIONS: meal prep, cleaning, laundry, driving, shopping, community activity, and yard work   PERSONAL FACTORS Behavior pattern, Past/current  experiences, Time since onset of injury/illness/exacerbation, and 1-2 comorbidities: GI bleed, seizures  are also affecting patient's functional outcome.    REHAB POTENTIAL: Fair time since onset   CLINICAL DECISION MAKING: Evolving/moderate complexity   EVALUATION COMPLEXITY: Moderate   PLAN: PT FREQUENCY: 1-2x/week   PT DURATION: 8 weeks   PLANNED INTERVENTIONS: Therapeutic exercises, Therapeutic activity, Neuromuscular re-education, Balance training, Gait training, Patient/Family education, Joint mobilization, Stair training, Vestibular training, Visual/preceptual remediation/compensation, Orthotic/Fit training, DME instructions, Aquatic Therapy, Cognitive remediation, Wheelchair mobility training, Cryotherapy, Moist heat, Manual therapy, and Re-evaluation   PLAN FOR NEXT SESSION: address gait mechanics, pre gait, SAFETY AWARENESS, balance strategies (rockerboard, standing on foam), hows HEP?   Westley Foots, PT Westley Foots, PT, DPT, CBIS  06/20/2022, 1:19 PM

## 2022-06-24 ENCOUNTER — Ambulatory Visit: Payer: Medicare Other | Attending: Internal Medicine

## 2022-06-24 DIAGNOSIS — R293 Abnormal posture: Secondary | ICD-10-CM | POA: Insufficient documentation

## 2022-06-24 DIAGNOSIS — R260 Ataxic gait: Secondary | ICD-10-CM | POA: Diagnosis not present

## 2022-06-24 NOTE — Therapy (Signed)
OUTPATIENT PHYSICAL THERAPY TREATMENT NOTE   Patient Name: Zachary Thornton MRN: [id number] DOB:March 18, 1966, 56 y.o., male Today's Date: 06/24/2022  PCP: [name], MD REFERRING PROVIDER: [name], MD  END OF SESSION:   PT End of Session - 06/24/22 1449     Visit Number 4    Number of Visits 17    Date for PT Re-Evaluation 08/06/22    Authorization Type Medicare    PT Start Time 1447    PT Stop Time 1530    PT Time Calculation (min) 43 min    Equipment Utilized During Treatment Gait belt    Activity Tolerance Patient tolerated treatment well    Behavior During Therapy WFL for tasks assessed/performed;Impulsive             Past Medical History:  Diagnosis Date   Back injury    Blood loss anemia    Blood transfusion without reported diagnosis    Dementia with behavioral disturbance (HCC)    Depression    Duodenal ulcer hemorrhage    GI bleed    Headache    Seizures (HCC)    TBI (traumatic brain injury) (HCC)    Past Surgical History:  Procedure Laterality Date   BRAIN SURGERY     CRANIOTOMY     Post TBI craniotomy and plate insertion   CSF SHUNT     ESOPHAGOGASTRODUODENOSCOPY N/A 11/01/2015   Procedure: ESOPHAGOGASTRODUODENOSCOPY (EGD);  Surgeon: Ruffin Frederick, MD;  Location: [facility];  Service: Gastroenterology;  Laterality: N/A;   ESOPHAGOGASTRODUODENOSCOPY (EGD) WITH PROPOFOL N/A 10/30/2015   Procedure: ESOPHAGOGASTRODUODENOSCOPY (EGD) WITH PROPOFOL;  Surgeon: Rachael Fee, MD;  Location: [facility];  Service: Endoscopy;  Laterality: N/A;   Patient Active Problem List   Diagnosis Date Noted   History of duodenal ulcer with hemorrhage 03/14/2021   Severe sepsis (HCC) 03/14/2021   Pneumonia 03/14/2021   Sepsis (HCC) 03/14/2021   Syncope 03/23/2017   Bleeding duodenal ulcer    Hypokalemia    Lactic acidosis    Duodenal ulcer hemorrhage 10/31/2015   Urinary retention 10/30/2015   Acute blood loss anemia 10/29/2015   Hypotension 10/29/2015    Syncope and collapse 10/29/2015   Traumatic brain injury with depressed frontal skull fracture (HCC) 09/07/2012   Cognitive deficit as late effect of traumatic brain injury (HCC) 09/07/2012   Episodic dyscontrol syndrome 09/07/2012    REFERRING DIAG: R26.89 (ICD-10-CM) - Other abnormalities of gait and mobility  THERAPY DIAG:  Ataxic gait  Abnormal posture  Rationale for Evaluation and Treatment Rehabilitation  PERTINENT HISTORY: TBI, GI bleed, seizures  PRECAUTIONS: fall  SUBJECTIVE: Patient reports doing well- continues to deny falls/near falls. HEP going well.   PAIN:  Are you having pain? No   TODAY'S TREATMENT:    Gait: -230' ambulating with 3# ankle weights B LE + CGA (verbal cues for increased B knee flexion and increased R reciprocal arm swing -resisted gait with 3# ankle weights 230' with random balance perturbations with CGA/MinA provided   NMR:  -standing on foam cone tapping (up to MaxA to maintain balance with very poor awareness of LOB- typically posterior R)  -leg press 2x12 90# -legg press U LE knee extension + heel lift 2x10 B LE 40# -standing on rockerboard in // bars A/P static standing balance (frequent posterior LOB with limited ability to correct, predominantly used head strategy to attempt to regain) -standing on rockerboard in // bars lateral with frequent LOB R with limited ability to correct -scifit hills x8  mins level 3    PATIENT EDUCATION: Education details: balance strategies Person educated: Patient and Roommate Education method: Medical illustrator Education comprehension: verbalized understanding and needs further education     HOME EXERCISE PROGRAM: Access Code: [email or web address] URL: [email or web address] Date: 06/20/2022 Prepared by: Merry Lofty  Exercises - Sit to Stand with Counter Support  - 1 x daily - 7 x weekly - 3 sets - 10 reps - Mini Squat with Counter Support  - 1 x daily - 7 x weekly - 3 sets  - 10 reps - Standing March with Counter Support  - 1 x daily - 7 x weekly - 3 sets - 10 reps - Heel Raises with Counter Support  - 1 x daily - 7 x weekly - 3 sets - 10 reps - Heel Toe Raises with Counter Support  - 1 x daily - 7 x weekly - 3 sets - 10 reps     GOALS: Goals reviewed with patient? Yes   SHORT TERM GOALS: Target date: 07/09/2022   Pt will be independent with initial HEP for improved balance and functional strength   Baseline: to be provided Goal status: INITIAL   2.  Pt will improve 5x STS to </= 16 sec to demo improved functional LE strength and balance    Baseline: 19.63s with no UE, CGA Goal status: INITIAL   3.  Pt will improve FGA to >/= 11/30 to demonstrate improved balance and reduced fall risk   Baseline: 6/30 Goal status: INITIAL   4.  Patient will be compliant using LRAD at least 75% of the time at home  Baseline: patient does not use AD at baseline Goal status: INITIAL     LONG TERM GOALS: Target date: 08/06/2022   Pt will be independent with final HEP for improved balance  Baseline: to be provided Goal status: INITIAL   2.  Pt will improve FGA to >/= 16/30 to demonstrate improved balance and reduced fall risk   Baseline: 6/30 Goal status: INITIAL   3.  Pt will improve 5x STS to </= 13 sec to demo improved functional LE strength and balance   Baseline: 19.36S no UE, CGA Goal status: INITIAL   4.  Patient will be compliant with use of LRAD as recommended 100% of the time Baseline: patient does not use an AD Goal status: INITIAL   5.  Patient will experience <3 near-falls/day Baseline: >10 near falls/day Goal status: INITIAL   ASSESSMENT:   CLINICAL IMPRESSION: Patient seen for skilled PT session with emphasis on balance and gait re-training. Able to maintain ambulatory balance with random resistance and posterior perturbations, but poor awareness and limited ability to regain balance with static standing balance on forgiving or unsteady  surface. Patient with preference toward head strategy to attempt to regain balance on rockerboard, but inevitably required up to [facility] to regain. Continue POC.     OBJECTIVE IMPAIRMENTS Abnormal gait, decreased balance, decreased cognition, decreased coordination, decreased endurance, decreased knowledge of condition, decreased knowledge of use of DME, difficulty walking, decreased ROM, decreased safety awareness, and impaired tone.    ACTIVITY LIMITATIONS carrying, lifting, bending, standing, squatting, stairs, transfers, reach over head, and locomotion level   PARTICIPATION LIMITATIONS: meal prep, cleaning, laundry, driving, shopping, community activity, and yard work   PERSONAL FACTORS Behavior pattern, Past/current experiences, Time since onset of injury/illness/exacerbation, and 1-2 comorbidities: GI bleed, seizures  are also affecting patient's functional outcome.    REHAB POTENTIAL: Fair time since onset   CLINICAL  DECISION MAKING: Evolving/moderate complexity   EVALUATION COMPLEXITY: Moderate   PLAN: PT FREQUENCY: 1-2x/week   PT DURATION: 8 weeks   PLANNED INTERVENTIONS: Therapeutic exercises, Therapeutic activity, Neuromuscular re-education, Balance training, Gait training, Patient/Family education, Joint mobilization, Stair training, Vestibular training, Visual/preceptual remediation/compensation, Orthotic/Fit training, DME instructions, Aquatic Therapy, Cognitive remediation, Wheelchair mobility training, Cryotherapy, Moist heat, Manual therapy, and Re-evaluation   PLAN FOR NEXT SESSION: address gait mechanics, pre gait, SAFETY AWARENESS, balance strategies (rockerboard, standing on foam), hows HEP?   Westley Foots, PT Westley Foots, PT, DPT, CBIS  06/24/2022, 3:33 PM

## 2022-06-27 ENCOUNTER — Ambulatory Visit: Payer: Medicare Other | Admitting: Physical Therapy

## 2022-06-27 ENCOUNTER — Encounter: Payer: Self-pay | Admitting: Physical Therapy

## 2022-06-27 DIAGNOSIS — R260 Ataxic gait: Secondary | ICD-10-CM | POA: Diagnosis not present

## 2022-06-27 DIAGNOSIS — R293 Abnormal posture: Secondary | ICD-10-CM | POA: Diagnosis not present

## 2022-06-27 NOTE — Therapy (Signed)
OUTPATIENT PHYSICAL THERAPY TREATMENT NOTE   Patient Name: NUCHEM GRATTAN MRN: [id number] DOB:[birth year], 56 y.o., male Today's Date: 06/27/2022  PCP: [name], MD REFERRING PROVIDER: [name], MD  END OF SESSION:   PT End of Session - 06/27/22 1407     Visit Number 5    Number of Visits 17    Date for PT Re-Evaluation 08/06/22    Authorization Type Medicare    PT Start Time 1404    PT Stop Time 1445    PT Time Calculation (min) 41 min    Equipment Utilized During Treatment Gait belt    Activity Tolerance Patient tolerated treatment well    Behavior During Therapy WFL for tasks assessed/performed;Impulsive             Past Medical History:  Diagnosis Date   Back injury    Blood loss anemia    Blood transfusion without reported diagnosis    Dementia with behavioral disturbance (HCC)    Depression    Duodenal ulcer hemorrhage    GI bleed    Headache    Seizures (HCC)    TBI (traumatic brain injury) (HCC)    Past Surgical History:  Procedure Laterality Date   BRAIN SURGERY     CRANIOTOMY     Post TBI craniotomy and plate insertion   CSF SHUNT     ESOPHAGOGASTRODUODENOSCOPY N/A 11/01/2015   Procedure: ESOPHAGOGASTRODUODENOSCOPY (EGD);  Surgeon: Ruffin Frederick, MD;  Location: [facility];  Service: Gastroenterology;  Laterality: N/A;   ESOPHAGOGASTRODUODENOSCOPY (EGD) WITH PROPOFOL N/A 10/30/2015   Procedure: ESOPHAGOGASTRODUODENOSCOPY (EGD) WITH PROPOFOL;  Surgeon: Rachael Fee, MD;  Location: [facility];  Service: Endoscopy;  Laterality: N/A;   Patient Active Problem List   Diagnosis Date Noted   History of duodenal ulcer with hemorrhage 03/14/2021   Severe sepsis (HCC) 03/14/2021   Pneumonia 03/14/2021   Sepsis (HCC) 03/14/2021   Syncope 03/23/2017   Bleeding duodenal ulcer    Hypokalemia    Lactic acidosis    Duodenal ulcer hemorrhage 10/31/2015   Urinary retention 10/30/2015   Acute blood loss anemia 10/29/2015   Hypotension 10/29/2015    Syncope and collapse 10/29/2015   Traumatic brain injury with depressed frontal skull fracture (HCC) 09/07/2012   Cognitive deficit as late effect of traumatic brain injury (HCC) 09/07/2012   Episodic dyscontrol syndrome 09/07/2012    REFERRING DIAG: R26.89 (ICD-10-CM) - Other abnormalities of gait and mobility  THERAPY DIAG:  Ataxic gait  Abnormal posture  Rationale for Evaluation and Treatment Rehabilitation  PERTINENT HISTORY: TBI, GI bleed, seizures  PRECAUTIONS: fall  SUBJECTIVE: Pt denies falls and near falls.  He states everything is the same as last time.   PAIN:  Are you having pain? No   TODAY'S TREATMENT:   Gait: -treadmill training performed w/ BUE support on front rails to promote maintained upright and combat posterior lean.  Completed 8 minutes progressing to 1.8 mph w/ BLE 3# ankle weights.  +2 for safety, but pt demonstrates no over LOB or other major deviation.  MinA for pelvic facilitation to maintain hips forward during limb advancement w/ cuing for step size and quiet steps.   NMR:  -standing on foam at EOM performing crossbody reaching outside BOS to retrieve bean bags and toss at midline target, cues to improve accuracy and to progress to narrowed BOS w/ feet approximately 4" apart. -at countertop standing on anteriorly oriented tilt board w/ handheld assist and unilateral countertop support performing anterior and posterior  tilts cued for inc amplitude of motion w/ dec UE support to none; inc posterior lean noted when posteriorly tapping -lateral stepping w/ red theraband at countertop w/ UE support to facilitate forward lean to engage gluts 6x8'; attempted overground w/o UE support w/ pt demonstrating difficulty maintaining crouched position -Performed step over 2" hurdles w/ unilateral UE support progressed to none; performed forward facing and laterally w/ inc cuing to maintain balance w/ lateral stepping    PATIENT EDUCATION: Education details:  Continue HEP w/ supervision. Person educated: Patient and Roommate Education method: Medical illustrator Education comprehension: verbalized understanding and needs further education     HOME EXERCISE PROGRAM: Access Code: [email or web address] URL: [email or web address] Date: 06/20/2022 Prepared by: Merry Lofty  Exercises - Sit to Stand with Counter Support  - 1 x daily - 7 x weekly - 3 sets - 10 reps - Mini Squat with Counter Support  - 1 x daily - 7 x weekly - 3 sets - 10 reps - Standing March with Counter Support  - 1 x daily - 7 x weekly - 3 sets - 10 reps - Heel Raises with Counter Support  - 1 x daily - 7 x weekly - 3 sets - 10 reps - Heel Toe Raises with Counter Support  - 1 x daily - 7 x weekly - 3 sets - 10 reps     GOALS: Goals reviewed with patient? Yes   SHORT TERM GOALS: Target date: 07/09/2022   Pt will be independent with initial HEP for improved balance and functional strength   Baseline: to be provided Goal status: INITIAL   2.  Pt will improve 5x STS to </= 16 sec to demo improved functional LE strength and balance    Baseline: 19.63s with no UE, CGA Goal status: INITIAL   3.  Pt will improve FGA to >/= 11/30 to demonstrate improved balance and reduced fall risk   Baseline: 6/30 Goal status: INITIAL   4.  Patient will be compliant using LRAD at least 75% of the time at home  Baseline: patient does not use AD at baseline Goal status: INITIAL     LONG TERM GOALS: Target date: 08/06/2022   Pt will be independent with final HEP for improved balance  Baseline: to be provided Goal status: INITIAL   2.  Pt will improve FGA to >/= 16/30 to demonstrate improved balance and reduced fall risk   Baseline: 6/30 Goal status: INITIAL   3.  Pt will improve 5x STS to </= 13 sec to demo improved functional LE strength and balance   Baseline: 19.36S no UE, CGA Goal status: INITIAL   4.  Patient will be compliant with use of LRAD as recommended  100% of the time Baseline: patient does not use an AD Goal status: INITIAL   5.  Patient will experience <3 near-falls/day Baseline: >10 near falls/day Goal status: INITIAL   ASSESSMENT:   CLINICAL IMPRESSION: Skilled session initiated with gait training on treadmill which pt tolerates well and would benefit from repeated trials in the future with assessment of carryover to overground training with narrowed BOS.  Continued to challenge static and dynamic balance today with pt engaging well to all tasks and demonstrating decreased posterior lean, but remaining limited by occasional posterior LOB requiring inc assist to recover.  He continues to tolerate skilled PT intervention without issue and is progressing toward LTGs.    OBJECTIVE IMPAIRMENTS Abnormal gait, decreased balance, decreased cognition, decreased coordination, decreased endurance, decreased knowledge of  condition, decreased knowledge of use of DME, difficulty walking, decreased ROM, decreased safety awareness, and impaired tone.    ACTIVITY LIMITATIONS carrying, lifting, bending, standing, squatting, stairs, transfers, reach over head, and locomotion level   PARTICIPATION LIMITATIONS: meal prep, cleaning, laundry, driving, shopping, community activity, and yard work   PERSONAL FACTORS Behavior pattern, Past/current experiences, Time since onset of injury/illness/exacerbation, and 1-2 comorbidities: GI bleed, seizures  are also affecting patient's functional outcome.    REHAB POTENTIAL: Fair time since onset   CLINICAL DECISION MAKING: Evolving/moderate complexity   EVALUATION COMPLEXITY: Moderate   PLAN: PT FREQUENCY: 1-2x/week   PT DURATION: 8 weeks   PLANNED INTERVENTIONS: Therapeutic exercises, Therapeutic activity, Neuromuscular re-education, Balance training, Gait training, Patient/Family education, Joint mobilization, Stair training, Vestibular training, Visual/preceptual remediation/compensation, Orthotic/Fit  training, DME instructions, Aquatic Therapy, Cognitive remediation, Wheelchair mobility training, Cryotherapy, Moist heat, Manual therapy, and Re-evaluation   PLAN FOR NEXT SESSION: address gait mechanics-does well with treadmill training, may benefit from ball kicks or incline training, pre gait, SAFETY AWARENESS, balance strategies (rockerboard, standing on foam), hows HEP?, work on narrowed BOS   Sadie Haber, PT, DPT  06/27/2022, 3:24 PM

## 2022-07-02 ENCOUNTER — Ambulatory Visit: Payer: Medicare Other

## 2022-07-02 DIAGNOSIS — R260 Ataxic gait: Secondary | ICD-10-CM | POA: Diagnosis not present

## 2022-07-02 DIAGNOSIS — R293 Abnormal posture: Secondary | ICD-10-CM

## 2022-07-02 NOTE — Therapy (Signed)
OUTPATIENT PHYSICAL THERAPY TREATMENT NOTE   Patient Name: Zachary Thornton MRN: [id number] DOB:01-05-1966, 56 y.o., male Today's Date: 07/02/2022  PCP: [name], MD REFERRING PROVIDER: [name], MD  END OF SESSION:   PT End of Session - 07/02/22 1323     Visit Number 6    Number of Visits 17    Date for PT Re-Evaluation 08/06/22    Authorization Type Medicare    PT Start Time 1317    PT Stop Time 1357    PT Time Calculation (min) 40 min    Equipment Utilized During Treatment Gait belt    Activity Tolerance Patient tolerated treatment well    Behavior During Therapy WFL for tasks assessed/performed;Impulsive             Past Medical History:  Diagnosis Date   Back injury    Blood loss anemia    Blood transfusion without reported diagnosis    Dementia with behavioral disturbance (HCC)    Depression    Duodenal ulcer hemorrhage    GI bleed    Headache    Seizures (HCC)    TBI (traumatic brain injury) (HCC)    Past Surgical History:  Procedure Laterality Date   BRAIN SURGERY     CRANIOTOMY     Post TBI craniotomy and plate insertion   CSF SHUNT     ESOPHAGOGASTRODUODENOSCOPY N/A 11/01/2015   Procedure: ESOPHAGOGASTRODUODENOSCOPY (EGD);  Surgeon: Ruffin Frederick, MD;  Location: [facility];  Service: Gastroenterology;  Laterality: N/A;   ESOPHAGOGASTRODUODENOSCOPY (EGD) WITH PROPOFOL N/A 10/30/2015   Procedure: ESOPHAGOGASTRODUODENOSCOPY (EGD) WITH PROPOFOL;  Surgeon: Rachael Fee, MD;  Location: [facility];  Service: Endoscopy;  Laterality: N/A;   Patient Active Problem List   Diagnosis Date Noted   History of duodenal ulcer with hemorrhage 03/14/2021   Severe sepsis (HCC) 03/14/2021   Pneumonia 03/14/2021   Sepsis (HCC) 03/14/2021   Syncope 03/23/2017   Bleeding duodenal ulcer    Hypokalemia    Lactic acidosis    Duodenal ulcer hemorrhage 10/31/2015   Urinary retention 10/30/2015   Acute blood loss anemia 10/29/2015   Hypotension  10/29/2015   Syncope and collapse 10/29/2015   Traumatic brain injury with depressed frontal skull fracture (HCC) 09/07/2012   Cognitive deficit as late effect of traumatic brain injury (HCC) 09/07/2012   Episodic dyscontrol syndrome 09/07/2012    REFERRING DIAG: R26.89 (ICD-10-CM) - Other abnormalities of gait and mobility  THERAPY DIAG:  Ataxic gait  Abnormal posture  Rationale for Evaluation and Treatment Rehabilitation  PERTINENT HISTORY: TBI, GI bleed, seizures  PRECAUTIONS: fall  SUBJECTIVE: Pt denies falls and near falls.  He states everything is the same as last time.   PAIN:  Are you having pain? No   TODAY'S TREATMENT:  NMR:  -forced reciprocal arm swing with walking poles 115' + MinA - ball toss -> NBOS ball toss-> ball toss + step  -balloon toss -> NBOS balloon toss -standing kicking ball back and forth  -Nustep level 5 x 10 mins     PATIENT EDUCATION: Education details: Continue HEP w/ supervision. Person educated: Patient and Roommate Education method: Medical illustrator Education comprehension: verbalized understanding and needs further education     HOME EXERCISE PROGRAM: Access Code: [email or web address] URL: [email or web address] Date: 06/20/2022 Prepared by: Merry Lofty  Exercises - Sit to Stand with Counter Support  - 1 x daily - 7 x weekly - 3 sets - 10 reps - Mini Squat with Counter Support  -  1 x daily - 7 x weekly - 3 sets - 10 reps - Standing March with Counter Support  - 1 x daily - 7 x weekly - 3 sets - 10 reps - Heel Raises with Counter Support  - 1 x daily - 7 x weekly - 3 sets - 10 reps - Heel Toe Raises with Counter Support  - 1 x daily - 7 x weekly - 3 sets - 10 reps     GOALS: Goals reviewed with patient? Yes   SHORT TERM GOALS: Target date: 07/09/2022   Pt will be independent with initial HEP for improved balance and functional strength   Baseline: to be provided Goal status: INITIAL   2.  Pt will  improve 5x STS to </= 16 sec to demo improved functional LE strength and balance    Baseline: 19.63s with no UE, CGA Goal status: INITIAL   3.  Pt will improve FGA to >/= 11/30 to demonstrate improved balance and reduced fall risk   Baseline: 6/30 Goal status: INITIAL   4.  Patient will be compliant using LRAD at least 75% of the time at home  Baseline: patient does not use AD at baseline Goal status: INITIAL     LONG TERM GOALS: Target date: 08/06/2022   Pt will be independent with final HEP for improved balance  Baseline: to be provided Goal status: INITIAL   2.  Pt will improve FGA to >/= 16/30 to demonstrate improved balance and reduced fall risk   Baseline: 6/30 Goal status: INITIAL   3.  Pt will improve 5x STS to </= 13 sec to demo improved functional LE strength and balance   Baseline: 19.36S no UE, CGA Goal status: INITIAL   4.  Patient will be compliant with use of LRAD as recommended 100% of the time Baseline: patient does not use an AD Goal status: INITIAL   5.  Patient will experience <3 near-falls/day Baseline: >10 near falls/day Goal status: INITIAL   ASSESSMENT:   CLINICAL IMPRESSION: Skilled PT session focused on balance and NMR. He is improving his ability to safely follow multistep cues to complete more complex balance tasks safely. Tasks emphasizing reactionary and anticipatory balance. Patient continues to self-select WBOS requiring cues for hip-width BOS, but no excessive LOB noted with narrower BOS noted. Able to complete simple dual motor task of step and toss ball, but decreased ability to maintain functional SLS to kick soccer ball. Continue POC.     OBJECTIVE IMPAIRMENTS Abnormal gait, decreased balance, decreased cognition, decreased coordination, decreased endurance, decreased knowledge of condition, decreased knowledge of use of DME, difficulty walking, decreased ROM, decreased safety awareness, and impaired tone.    ACTIVITY LIMITATIONS  carrying, lifting, bending, standing, squatting, stairs, transfers, reach over head, and locomotion level   PARTICIPATION LIMITATIONS: meal prep, cleaning, laundry, driving, shopping, community activity, and yard work   PERSONAL FACTORS Behavior pattern, Past/current experiences, Time since onset of injury/illness/exacerbation, and 1-2 comorbidities: GI bleed, seizures  are also affecting patient's functional outcome.    REHAB POTENTIAL: Fair time since onset   CLINICAL DECISION MAKING: Evolving/moderate complexity   EVALUATION COMPLEXITY: Moderate   PLAN: PT FREQUENCY: 1-2x/week   PT DURATION: 8 weeks   PLANNED INTERVENTIONS: Therapeutic exercises, Therapeutic activity, Neuromuscular re-education, Balance training, Gait training, Patient/Family education, Joint mobilization, Stair training, Vestibular training, Visual/preceptual remediation/compensation, Orthotic/Fit training, DME instructions, Aquatic Therapy, Cognitive remediation, Wheelchair mobility training, Cryotherapy, Moist heat, Manual therapy, and Re-evaluation   PLAN FOR NEXT SESSION: address  gait mechanics-does well with treadmill training, may benefit from ball kicks or incline training, pre gait, SAFETY AWARENESS, balance strategies (rockerboard, standing on foam), hows HEP?, work on narrowed BOS   Westley Foots, PT, DPT  07/02/2022, 2:00 PM

## 2022-07-04 ENCOUNTER — Encounter: Payer: Self-pay | Admitting: Physical Therapy

## 2022-07-04 ENCOUNTER — Ambulatory Visit: Payer: Medicare Other | Admitting: Physical Therapy

## 2022-07-04 DIAGNOSIS — R293 Abnormal posture: Secondary | ICD-10-CM

## 2022-07-04 DIAGNOSIS — R260 Ataxic gait: Secondary | ICD-10-CM

## 2022-07-04 NOTE — Therapy (Signed)
OUTPATIENT PHYSICAL THERAPY TREATMENT NOTE   Patient Name: Zachary Thornton MRN: [id number] DOB:1966-04-18, 56 y.o., male Today's Date: 07/04/2022  PCP: [name], MD REFERRING PROVIDER: [name], MD  END OF SESSION:   PT End of Session - 07/04/22 1405     Visit Number 7    Number of Visits 17    Date for PT Re-Evaluation 08/06/22    Authorization Type Medicare    PT Start Time 1403    PT Stop Time 1444    PT Time Calculation (min) 41 min    Equipment Utilized During Treatment Gait belt    Activity Tolerance Patient tolerated treatment well    Behavior During Therapy WFL for tasks assessed/performed;Impulsive             Past Medical History:  Diagnosis Date   Back injury    Blood loss anemia    Blood transfusion without reported diagnosis    Dementia with behavioral disturbance (HCC)    Depression    Duodenal ulcer hemorrhage    GI bleed    Headache    Seizures (HCC)    TBI (traumatic brain injury) (HCC)    Past Surgical History:  Procedure Laterality Date   BRAIN SURGERY     CRANIOTOMY     Post TBI craniotomy and plate insertion   CSF SHUNT     ESOPHAGOGASTRODUODENOSCOPY N/A 11/01/2015   Procedure: ESOPHAGOGASTRODUODENOSCOPY (EGD);  Surgeon: Ruffin Frederick, MD;  Location: [facility];  Service: Gastroenterology;  Laterality: N/A;   ESOPHAGOGASTRODUODENOSCOPY (EGD) WITH PROPOFOL N/A 10/30/2015   Procedure: ESOPHAGOGASTRODUODENOSCOPY (EGD) WITH PROPOFOL;  Surgeon: Rachael Fee, MD;  Location: [facility];  Service: Endoscopy;  Laterality: N/A;   Patient Active Problem List   Diagnosis Date Noted   History of duodenal ulcer with hemorrhage 03/14/2021   Severe sepsis (HCC) 03/14/2021   Pneumonia 03/14/2021   Sepsis (HCC) 03/14/2021   Syncope 03/23/2017   Bleeding duodenal ulcer    Hypokalemia    Lactic acidosis    Duodenal ulcer hemorrhage 10/31/2015   Urinary retention 10/30/2015   Acute blood loss anemia 10/29/2015   Hypotension  10/29/2015   Syncope and collapse 10/29/2015   Traumatic brain injury with depressed frontal skull fracture (HCC) 09/07/2012   Cognitive deficit as late effect of traumatic brain injury (HCC) 09/07/2012   Episodic dyscontrol syndrome 09/07/2012    REFERRING DIAG: R26.89 (ICD-10-CM) - Other abnormalities of gait and mobility  THERAPY DIAG:  Ataxic gait  Abnormal posture  Rationale for Evaluation and Treatment Rehabilitation  PERTINENT HISTORY: TBI, GI bleed, seizures  PRECAUTIONS: fall  SUBJECTIVE: Pt denies falls and near falls.  He states he has been doing push-ups and situps at home and reading the Bible.  PAIN:  Are you having pain? No  TODAY'S TREATMENT:  -Treadmill training w/ BUE support on lateral rails x21mins ramping up and down to 2% incline and 1.18mph, cued for quiet steps to encourage heel strike, demos improved narrowed BOS on treadmill. -Overground training x400' w/ pt demonstrating min carryover of narrowed BOS at onset of training w/o cues with progressive regression to widened and ER BOS as distance increased w/ pt making no corrections to verbal cues.  Able to correct but not maintain RUE relaxation and arm swing. -dot drills beginning w/ use of 4 dots and visual and verbal cues for step-to color called > 6 different color dots and only verbal cues for inc cognitive challenge, minA for facilitation at trunk to encourage anterior weight  shift -Step-to dots in narrowed path forward and backward to encourage narrowed BOS dynamically -SLS w/ alt LE elevated on soccer ball progressing to single fingertip support on countertop, using tactile and verbal cuing to promote weight shift to maintain balance x40min each LE  PATIENT EDUCATION: Education details: Continue HEP w/ supervision. Person educated: Patient and Roommate Education method: Medical illustrator Education comprehension: verbalized understanding and needs further education     HOME EXERCISE  PROGRAM: Access Code: [email or web address] URL: [email or web address] Date: 06/20/2022 Prepared by: Merry Lofty  Exercises - Sit to Stand with Counter Support  - 1 x daily - 7 x weekly - 3 sets - 10 reps - Mini Squat with Counter Support  - 1 x daily - 7 x weekly - 3 sets - 10 reps - Standing March with Counter Support  - 1 x daily - 7 x weekly - 3 sets - 10 reps - Heel Raises with Counter Support  - 1 x daily - 7 x weekly - 3 sets - 10 reps - Heel Toe Raises with Counter Support  - 1 x daily - 7 x weekly - 3 sets - 10 reps     GOALS: Goals reviewed with patient? Yes   SHORT TERM GOALS: Target date: 07/09/2022   Pt will be independent with initial HEP for improved balance and functional strength   Baseline: to be provided Goal status: INITIAL   2.  Pt will improve 5x STS to </= 16 sec to demo improved functional LE strength and balance    Baseline: 19.63s with no UE, CGA Goal status: INITIAL   3.  Pt will improve FGA to >/= 11/30 to demonstrate improved balance and reduced fall risk   Baseline: 6/30 Goal status: INITIAL   4.  Patient will be compliant using LRAD at least 75% of the time at home  Baseline: patient does not use AD at baseline Goal status: INITIAL     LONG TERM GOALS: Target date: 08/06/2022   Pt will be independent with final HEP for improved balance  Baseline: to be provided Goal status: INITIAL   2.  Pt will improve FGA to >/= 16/30 to demonstrate improved balance and reduced fall risk   Baseline: 6/30 Goal status: INITIAL   3.  Pt will improve 5x STS to </= 13 sec to demo improved functional LE strength and balance   Baseline: 19.36S no UE, CGA Goal status: INITIAL   4.  Patient will be compliant with use of LRAD as recommended 100% of the time Baseline: patient does not use an AD Goal status: INITIAL   5.  Patient will experience <3 near-falls/day Baseline: >10 near falls/day Goal status: INITIAL   ASSESSMENT:   CLINICAL  IMPRESSION: Emphasis of skilled session on promotion of NMR and balance particularly SLS and dynamic balance w/ narrowed BOS.  Pt demonstrated little carryover at initiation of overground training following treadmill training.  Pt continues to respond well to treadmill training with automatic narrowing of BOS.  Will continue to address deficits per POC at following sessions.    OBJECTIVE IMPAIRMENTS Abnormal gait, decreased balance, decreased cognition, decreased coordination, decreased endurance, decreased knowledge of condition, decreased knowledge of use of DME, difficulty walking, decreased ROM, decreased safety awareness, and impaired tone.    ACTIVITY LIMITATIONS carrying, lifting, bending, standing, squatting, stairs, transfers, reach over head, and locomotion level   PARTICIPATION LIMITATIONS: meal prep, cleaning, laundry, driving, shopping, community activity, and yard work   PERSONAL FACTORS Behavior pattern, Past/current  experiences, Time since onset of injury/illness/exacerbation, and 1-2 comorbidities: GI bleed, seizures  are also affecting patient's functional outcome.    REHAB POTENTIAL: Fair time since onset   CLINICAL DECISION MAKING: Evolving/moderate complexity   EVALUATION COMPLEXITY: Moderate   PLAN: PT FREQUENCY: 1-2x/week   PT DURATION: 8 weeks   PLANNED INTERVENTIONS: Therapeutic exercises, Therapeutic activity, Neuromuscular re-education, Balance training, Gait training, Patient/Family education, Joint mobilization, Stair training, Vestibular training, Visual/preceptual remediation/compensation, Orthotic/Fit training, DME instructions, Aquatic Therapy, Cognitive remediation, Wheelchair mobility training, Cryotherapy, Moist heat, Manual therapy, and Re-evaluation   PLAN FOR NEXT SESSION: Assess STGs!  address gait mechanics-does well with treadmill training, may benefit from ball kicks or incline training, pre gait, SAFETY AWARENESS, balance strategies (rockerboard,  standing on foam), hows HEP?, work on narrowed BOS   Bary Richard, PT, DPT  07/04/2022, 2:50 PM

## 2022-07-09 ENCOUNTER — Ambulatory Visit: Payer: Medicare Other | Admitting: Physical Therapy

## 2022-07-09 ENCOUNTER — Encounter: Payer: Self-pay | Admitting: Physical Therapy

## 2022-07-09 DIAGNOSIS — R260 Ataxic gait: Secondary | ICD-10-CM | POA: Diagnosis not present

## 2022-07-09 DIAGNOSIS — R293 Abnormal posture: Secondary | ICD-10-CM

## 2022-07-09 NOTE — Therapy (Signed)
OUTPATIENT PHYSICAL THERAPY TREATMENT NOTE   Patient Name: Zachary Thornton MRN: [id number] DOB:1966/06/27, 56 y.o., male Today's Date: 07/09/2022  PCP: Crist Infante, MD REFERRING PROVIDER: Crist Infante, MD  END OF SESSION:   PT End of Session - 07/09/22 1319     Visit Number 8    Number of Visits 17    Date for PT Re-Evaluation 08/06/22    Authorization Type Medicare    PT Start Time 3846    PT Stop Time 1356    PT Time Calculation (min) 39 min    Equipment Utilized During Treatment Gait belt    Activity Tolerance Patient tolerated treatment well    Behavior During Therapy WFL for tasks assessed/performed;Impulsive             Past Medical History:  Diagnosis Date   Back injury    Blood loss anemia    Blood transfusion without reported diagnosis    Dementia with behavioral disturbance (HCC)    Depression    Duodenal ulcer hemorrhage    GI bleed    Headache    Seizures (HCC)    TBI (traumatic brain injury) ([place])    Past Surgical History:  Procedure Laterality Date   BRAIN SURGERY     CRANIOTOMY     Post TBI craniotomy and plate insertion   CSF SHUNT     ESOPHAGOGASTRODUODENOSCOPY N/A 11/01/2015   Procedure: ESOPHAGOGASTRODUODENOSCOPY (EGD);  Surgeon: Manus Gunning, MD;  Location: [facility];  Service: Gastroenterology;  Laterality: N/A;   ESOPHAGOGASTRODUODENOSCOPY (EGD) WITH PROPOFOL N/A 10/30/2015   Procedure: ESOPHAGOGASTRODUODENOSCOPY (EGD) WITH PROPOFOL;  Surgeon: Milus Banister, MD;  Location: [facility];  Service: Endoscopy;  Laterality: N/A;   Patient Active Problem List   Diagnosis Date Noted   History of duodenal ulcer with hemorrhage 03/14/2021   Severe sepsis ([place]) 03/14/2021   Pneumonia 03/14/2021   Sepsis ([place]) 03/14/2021   Syncope 03/23/2017   Bleeding duodenal ulcer    Hypokalemia    Lactic acidosis    Duodenal ulcer hemorrhage 10/31/2015   Urinary retention 10/30/2015   Acute blood loss anemia 10/29/2015   Hypotension  10/29/2015   Syncope and collapse 10/29/2015   Traumatic brain injury with depressed frontal skull fracture ([place]) 09/07/2012   Cognitive deficit as late effect of traumatic brain injury ([place]) 09/07/2012   Episodic dyscontrol syndrome 09/07/2012    REFERRING DIAG: R26.89 (ICD-10-CM) - Other abnormalities of gait and mobility  THERAPY DIAG:  Ataxic gait  Abnormal posture  Rationale for Evaluation and Treatment Rehabilitation  PERTINENT HISTORY: TBI, GI bleed, seizures  PRECAUTIONS: fall  SUBJECTIVE: Pt denies falls and near falls.  He states "nothing has changed finally".  PAIN:  Are you having pain? No  TODAY'S TREATMENT:  Reviewed HEP to assess pt understanding and compliance w/ pt able to demo w/o cuing: -STS x10 -Mini squat x10 w/ UE support -Standing march at counter 2x30 -heel and toe raises x10 each w/ BUE support  -Assessed 5xSTS w/o UE support:  16.28 sec, SBA -Assessed FGA:  [facility] PT Assessment - 07/09/22 1333       Functional Gait  Assessment   Gait assessed  Yes    Gait Level Surface Walks 20 ft in less than 7 sec but greater than 5.5 sec, uses assistive device, slower speed, mild gait deviations, or deviates 6-10 in outside of the 12 in walkway width.    Change in Gait Speed Able to change speed, demonstrates mild gait deviations, deviates 6-10 in  outside of the 12 in walkway width, or no gait deviations, unable to achieve a major change in velocity, or uses a change in velocity, or uses an assistive device.    Gait with Horizontal Head Turns Performs head turns with moderate changes in gait velocity, slows down, deviates 10-15 in outside 12 in walkway width but recovers, can continue to walk.    Gait with Vertical Head Turns Performs task with slight change in gait velocity (eg, minor disruption to smooth gait path), deviates 6 - 10 in outside 12 in walkway width or uses assistive device    Gait and Pivot Turn Turns slowly, requires verbal cueing, or requires  several small steps to catch balance following turn and stop    Step Over Obstacle Is able to step over one shoe box (4.5 in total height) but must slow down and adjust steps to clear box safely. May require verbal cueing.    Gait with Narrow Base of Support Ambulates less than 4 steps heel to toe or cannot perform without assistance.   lateral LOB   Gait with Eyes Closed Cannot walk 20 ft without assistance, severe gait deviations or imbalance, deviates greater than 15 in outside 12 in walkway width or will not attempt task.   significant sway with crossover step, modA to recover   Ambulating Backwards Walks 20 ft, slow speed, abnormal gait pattern, evidence for imbalance, deviates 10-15 in outside 12 in walkway width.    Steps Alternating feet, must use rail.    Total Score 12            -Tandem walking at countertop w/ minA and hand-over-hand assist - pt not safe with addition to HEP this visit, able to obtain tandem with significant cuing and decreased speed of motion -6" step ups alt LE progressed from BUE support to none x30; multimodal cues for anterior weight shift to prevent posterior LOB. -Practiced approach to sitting with pt initially approaching chair facing forward and grabbing chair with both hands, practiced chair approach with decreasing cues throughout session to assess carryover with pt able to demonstrate turn and reachback with request to sit safely on final approach to chair.  PATIENT EDUCATION: Education details: Continue HEP w/ supervision.  Discussed practicing safe sitting at home to promote carryover. Person educated: Patient and Roommate Education method: Customer service manager Education comprehension: verbalized understanding and needs further education   HOME EXERCISE PROGRAM: Access Code: [email or web address] URL: [email or web address] Date: 06/20/2022 Prepared by: Estevan Ryder  Exercises - Sit to Stand with Counter Support  - 1 x daily - 7 x  weekly - 3 sets - 10 reps - Mini Squat with Counter Support  - 1 x daily - 7 x weekly - 3 sets - 10 reps - Standing March with Counter Support  - 1 x daily - 7 x weekly - 3 sets - 10 reps - Heel Raises with Counter Support  - 1 x daily - 7 x weekly - 3 sets - 10 reps - Heel Toe Raises with Counter Support  - 1 x daily - 7 x weekly - 3 sets - 10 reps     GOALS: Goals reviewed with patient? Yes   SHORT TERM GOALS: Target date: 07/09/2022   Pt will be independent with initial HEP for improved balance and functional strength   Baseline: Established and reviewed, pt states compliance. Goal status: MET   2.  Pt will improve 5x STS to </= 16 sec to demo improved functional LE  strength and balance  Baseline: 19.63s with no UE, CGA; 16.28 sec w/ no UE support SBA Goal status: PROGRESSING   3.  Pt will improve FGA to >/= 11/30 to demonstrate improved balance and reduced fall risk Baseline: 6/30; 07/09/2022 12/30 Goal status: MET   4.  Patient will be compliant using LRAD at least 75% of the time at home  Baseline: patient does not use AD at baseline; he reports he is still not using an AD as he "does not need that" Goal status: NOT MET     LONG TERM GOALS: Target date: 08/06/2022   Pt will be independent with final HEP for improved balance  Baseline: to be provided Goal status: INITIAL   2.  Pt will improve FGA to >/= 16/30 to demonstrate improved balance and reduced fall risk   Baseline: 6/30 Goal status: INITIAL   3.  Pt will improve 5x STS to </= 13 sec to demo improved functional LE strength and balance   Baseline: 19.36S no UE, CGA Goal status: INITIAL   4.  Patient will be compliant with use of LRAD as recommended 100% of the time Baseline: patient does not use an AD Goal status: INITIAL   5.  Patient will experience <3 near-falls/day Baseline: >10 near falls/day Goal status: INITIAL   ASSESSMENT:   CLINICAL IMPRESSION: Assessed STGs this session with pt demonstrating  adequate improvement on all but 1 of 4 goals.  He continues to ambulate without AD and may not benefit from use of one due to potential lack of carryover, but will be further assessed in coming visit.  Upon physical review of current HEP, pt would benefit from advancements in following sessions.  His FGA score improved to 12/30, but he continues to be challenged by tandem stance, EC, backwards ambulation, and stair management with his impulsivity.  His 5xSTS w/o UE support improved to 16.28 seconds just shy of goal level.  Overall, pt is progressing well with skilled PT intervention and would further benefit to work on improved safety awareness with functional mobility and dynamic balance.    OBJECTIVE IMPAIRMENTS Abnormal gait, decreased balance, decreased cognition, decreased coordination, decreased endurance, decreased knowledge of condition, decreased knowledge of use of DME, difficulty walking, decreased ROM, decreased safety awareness, and impaired tone.    ACTIVITY LIMITATIONS carrying, lifting, bending, standing, squatting, stairs, transfers, reach over head, and locomotion level   PARTICIPATION LIMITATIONS: meal prep, cleaning, laundry, driving, shopping, community activity, and yard work   PERSONAL FACTORS Behavior pattern, Past/current experiences, Time since onset of injury/illness/exacerbation, and 1-2 comorbidities: GI bleed, seizures  are also affecting patient's functional outcome.    REHAB POTENTIAL: Fair time since onset   CLINICAL DECISION MAKING: Evolving/moderate complexity   EVALUATION COMPLEXITY: Moderate   PLAN: PT FREQUENCY: 1-2x/week   PT DURATION: 8 weeks   PLANNED INTERVENTIONS: Therapeutic exercises, Therapeutic activity, Neuromuscular re-education, Balance training, Gait training, Patient/Family education, Joint mobilization, Stair training, Vestibular training, Visual/preceptual remediation/compensation, Orthotic/Fit training, DME instructions, Aquatic Therapy,  Cognitive remediation, Wheelchair mobility training, Cryotherapy, Moist heat, Manual therapy, and Re-evaluation   PLAN FOR NEXT SESSION: Advance HEP, reinforce safe approach to sitting facing out with reachback, gait w/ LRAD vs none, address gait mechanics-does well with treadmill training, may benefit from ball kicks or incline training, pre gait, SAFETY AWARENESS, balance strategies (rockerboard, standing on foam), work on narrowed BOS   Bary Richard, PT, DPT  07/09/2022, 2:03 PM

## 2022-07-11 ENCOUNTER — Ambulatory Visit: Payer: Medicare Other | Admitting: Physical Therapy

## 2022-07-11 ENCOUNTER — Encounter: Payer: Self-pay | Admitting: Physical Therapy

## 2022-07-11 DIAGNOSIS — R293 Abnormal posture: Secondary | ICD-10-CM | POA: Diagnosis not present

## 2022-07-11 DIAGNOSIS — R260 Ataxic gait: Secondary | ICD-10-CM

## 2022-07-11 NOTE — Therapy (Signed)
OUTPATIENT PHYSICAL THERAPY TREATMENT NOTE   Patient Name: Zachary Thornton MRN: [id number] DOB:02-Feb-1966, 56 y.o., male Today's Date: 07/12/2022  PCP: Crist Infante, MD REFERRING PROVIDER: Crist Infante, MD  END OF SESSION:   PT End of Session - 07/11/22 1320     Visit Number 9    Number of Visits 17    Date for PT Re-Evaluation 08/06/22    Authorization Type Medicare    PT Start Time 1319    PT Stop Time 1400    PT Time Calculation (min) 41 min    Equipment Utilized During Treatment Gait belt    Activity Tolerance Patient tolerated treatment well    Behavior During Therapy WFL for tasks assessed/performed;Impulsive             Past Medical History:  Diagnosis Date   Back injury    Blood loss anemia    Blood transfusion without reported diagnosis    Dementia with behavioral disturbance (HCC)    Depression    Duodenal ulcer hemorrhage    GI bleed    Headache    Seizures (HCC)    TBI (traumatic brain injury) ([place])    Past Surgical History:  Procedure Laterality Date   BRAIN SURGERY     CRANIOTOMY     Post TBI craniotomy and plate insertion   CSF SHUNT     ESOPHAGOGASTRODUODENOSCOPY N/A 11/01/2015   Procedure: ESOPHAGOGASTRODUODENOSCOPY (EGD);  Surgeon: Manus Gunning, MD;  Location: [facility];  Service: Gastroenterology;  Laterality: N/A;   ESOPHAGOGASTRODUODENOSCOPY (EGD) WITH PROPOFOL N/A 10/30/2015   Procedure: ESOPHAGOGASTRODUODENOSCOPY (EGD) WITH PROPOFOL;  Surgeon: Milus Banister, MD;  Location: [facility];  Service: Endoscopy;  Laterality: N/A;   Patient Active Problem List   Diagnosis Date Noted   History of duodenal ulcer with hemorrhage 03/14/2021   Severe sepsis ([place]) 03/14/2021   Pneumonia 03/14/2021   Sepsis ([place]) 03/14/2021   Syncope 03/23/2017   Bleeding duodenal ulcer    Hypokalemia    Lactic acidosis    Duodenal ulcer hemorrhage 10/31/2015   Urinary retention 10/30/2015   Acute blood loss anemia 10/29/2015   Hypotension  10/29/2015   Syncope and collapse 10/29/2015   Traumatic brain injury with depressed frontal skull fracture ([place]) 09/07/2012   Cognitive deficit as late effect of traumatic brain injury ([place]) 09/07/2012   Episodic dyscontrol syndrome 09/07/2012    REFERRING DIAG: R26.89 (ICD-10-CM) - Other abnormalities of gait and mobility  THERAPY DIAG:  Ataxic gait  Abnormal posture  Rationale for Evaluation and Treatment Rehabilitation  PERTINENT HISTORY: TBI, GI bleed, seizures  PRECAUTIONS: fall  SUBJECTIVE: Pt denies falls and near falls.  No new complaints.   PAIN:  Are you having pain? No    TODAY'S TREATMENT:   STRENGTHENING  NuStep UE/LE's level 5  x 8 minutes with goal >/= 60 steps per minute for strengthening, activity tolerance and working on reciprocal movements  BALANCE/NMR:  Varied gait tasks to address more narrowed base of support and reciprocal arm swing: use of tile line of track with constant cues for pt to keep feet closer to seam between tiles with CGA; then with black band around thighs above knees to work on keeping LE's closer together for 1 lap with CGA, then working to incorporate skii poles for arm swing for another lap with up to mod assist for balance, cues needed to slow arms swing down as pt was attempting to overcome the facilitation being provided by PT through ski poles; then with  use of floor ladder working on reciprocal stepping in squares with pt able to narrow base of support to stay within width of floor ladder for 8 laps with CGA to min assist. Cues to slow down needed.  With blue foam beam:  Tandem gait forward/backward for 4 laps each way with light UE support, up to min assist with cues for step placement on beam. Tandem gait with heel taps forward/backward for 3 laps each way with light UE support on bars, up to min assist for balance.    GAIT: Gait pattern: step through pattern, decreased stride length, trunk flexed, and wide BOS Distance  walked: 115 x several laps, plus around clinic with session Assistive device utilized: None Level of assistance: SBA and CGA Comments: cues to bring feet closer together and for increased step/stride length with gait       PATIENT EDUCATION: Education details: continue with current HEP Person educated: Patient and Roommate Education method: Customer service manager Education comprehension: verbalized understanding and needs further education   HOME EXERCISE PROGRAM: Access Code: [email or web address] URL: [email or web address] Date: 06/20/2022 Prepared by: Estevan Ryder  Exercises - Sit to Stand with Counter Support  - 1 x daily - 7 x weekly - 3 sets - 10 reps - Mini Squat with Counter Support  - 1 x daily - 7 x weekly - 3 sets - 10 reps - Standing March with Counter Support  - 1 x daily - 7 x weekly - 3 sets - 10 reps - Heel Raises with Counter Support  - 1 x daily - 7 x weekly - 3 sets - 10 reps - Heel Toe Raises with Counter Support  - 1 x daily - 7 x weekly - 3 sets - 10 reps     GOALS: Goals reviewed with patient? Yes   SHORT TERM GOALS: Target date: 07/09/2022   Pt will be independent with initial HEP for improved balance and functional strength   Baseline: Established and reviewed, pt states compliance. Goal status: MET   2.  Pt will improve 5x STS to </= 16 sec to demo improved functional LE strength and balance  Baseline: 19.63s with no UE, CGA; 16.28 sec w/ no UE support SBA Goal status: PROGRESSING   3.  Pt will improve FGA to >/= 11/30 to demonstrate improved balance and reduced fall risk Baseline: 6/30; 07/09/2022 12/30 Goal status: MET   4.  Patient will be compliant using LRAD at least 75% of the time at home  Baseline: patient does not use AD at baseline; he reports he is still not using an AD as he "does not need that" Goal status: NOT MET     LONG TERM GOALS: Target date: 08/06/2022   Pt will be independent with final HEP for improved balance   Baseline: to be provided Goal status: INITIAL   2.  Pt will improve FGA to >/= 16/30 to demonstrate improved balance and reduced fall risk Baseline: 6/30 Goal status: INITIAL   3.  Pt will improve 5x STS to </= 13 sec to demo improved functional LE strength and balance   Baseline: 19.36S no UE, CGA Goal status: INITIAL   4.  Patient will be compliant with use of LRAD as recommended 100% of the time Baseline: patient does not use an AD Goal status: INITIAL   5.  Patient will experience <3 near-falls/day Baseline: >10 near falls/day Goal status: INITIAL   ASSESSMENT:   CLINICAL IMPRESSION: Today's skilled session continued to address gait mechanics  and balance tasks with focus toward more narrowed base of support. Pt continues to need cues to slow down and for general safety with mobility. The pt should benefit from continued PT to progress toward unmet goals.     OBJECTIVE IMPAIRMENTS Abnormal gait, decreased balance, decreased cognition, decreased coordination, decreased endurance, decreased knowledge of condition, decreased knowledge of use of DME, difficulty walking, decreased ROM, decreased safety awareness, and impaired tone.    ACTIVITY LIMITATIONS carrying, lifting, bending, standing, squatting, stairs, transfers, reach over head, and locomotion level   PARTICIPATION LIMITATIONS: meal prep, cleaning, laundry, driving, shopping, community activity, and yard work   PERSONAL FACTORS Behavior pattern, Past/current experiences, Time since onset of injury/illness/exacerbation, and 1-2 comorbidities: GI bleed, seizures  are also affecting patient's functional outcome.    REHAB POTENTIAL: Fair time since onset   CLINICAL DECISION MAKING: Evolving/moderate complexity   EVALUATION COMPLEXITY: Moderate   PLAN: PT FREQUENCY: 1-2x/week   PT DURATION: 8 weeks   PLANNED INTERVENTIONS: Therapeutic exercises, Therapeutic activity, Neuromuscular re-education, Balance training, Gait  training, Patient/Family education, Joint mobilization, Stair training, Vestibular training, Visual/preceptual remediation/compensation, Orthotic/Fit training, DME instructions, Aquatic Therapy, Cognitive remediation, Wheelchair mobility training, Cryotherapy, Moist heat, Manual therapy, and Re-evaluation   PLAN FOR NEXT SESSION:  10th visit progress note due! Advance HEP, reinforce safe approach to sitting facing out with reachback, gait w/ LRAD vs none, address gait mechanics-does well with treadmill training, may benefit from ball kicks or incline training, pre gait, SAFETY AWARENESS, balance strategies (rockerboard, standing on foam), work on narrowed BOS      Willow Ora, PTA, [place] [address], [place], [place] [address] [phone] 07/12/22, 11:34 AM

## 2022-07-15 ENCOUNTER — Ambulatory Visit: Payer: Medicare Other | Admitting: Physical Therapy

## 2022-07-15 ENCOUNTER — Encounter: Payer: Self-pay | Admitting: Physical Therapy

## 2022-07-15 DIAGNOSIS — R293 Abnormal posture: Secondary | ICD-10-CM

## 2022-07-15 DIAGNOSIS — R260 Ataxic gait: Secondary | ICD-10-CM | POA: Diagnosis not present

## 2022-07-15 NOTE — Patient Instructions (Signed)
Access Code: [email or web address] URL: [email or web address] Date: 07/15/2022 Prepared by: Camille Bal  Exercises - Mini Squat with Counter Support  - 1 x daily - 7 x weekly - 3 sets - 10 reps - Standing March with Counter Support  - 1 x daily - 7 x weekly - 3 sets - 10 reps - Heel Raises with Counter Support  - 1 x daily - 7 x weekly - 3 sets - 10 reps - Heel Toe Raises with Counter Support  - 1 x daily - 7 x weekly - 3 sets - 10 reps - Seated Hamstring Curls with Resistance  - 1 x daily - 5 x weekly - 2 sets - 20 reps - Side Stepping with Resistance at Thighs and Counter Support  - 1 x daily - 5 x weekly - 3 sets - 10 reps - Standing Hip Hinge  - 1 x daily - 5 x weekly - 3 sets - 10 reps - Corner Balance Feet Together With Eyes Open  - 1 x daily - 5 x weekly - 1 sets - 4 reps - 30 sec hold - Corner Balance Feet Together With Eyes Closed  - 1 x daily - 5 x weekly - 1 sets - 4 reps - 20-30 sec hold

## 2022-07-15 NOTE — Therapy (Signed)
OUTPATIENT PHYSICAL THERAPY TREATMENT NOTE/PROGRESS NOTE   Patient Name: Zachary Thornton MRN: [id number] DOB:Nov 30, 1966, 56 y.o., male Today's Date: 07/15/2022  PCP: Crist Infante, MD REFERRING PROVIDER: Crist Infante, MD  PT progress note for Zachary Thornton.  Reporting period 06/11/2022 to 07/15/2022  See Note below for Objective Data and Assessment of Progress/Goals  Thank you for the referral of this patient. Elease Etienne, PT, DPT  END OF SESSION:   PT End of Session - 07/15/22 1109     Visit Number 10    Number of Visits 17    Date for PT Re-Evaluation 08/06/22    Authorization Type Medicare    PT Start Time 1104    PT Stop Time 1142    PT Time Calculation (min) 38 min    Equipment Utilized During Treatment Gait belt    Activity Tolerance Patient tolerated treatment well    Behavior During Therapy WFL for tasks assessed/performed;Impulsive             Past Medical History:  Diagnosis Date   Back injury    Blood loss anemia    Blood transfusion without reported diagnosis    Dementia with behavioral disturbance (HCC)    Depression    Duodenal ulcer hemorrhage    GI bleed    Headache    Seizures (HCC)    TBI (traumatic brain injury) ([place])    Past Surgical History:  Procedure Laterality Date   BRAIN SURGERY     CRANIOTOMY     Post TBI craniotomy and plate insertion   CSF SHUNT     ESOPHAGOGASTRODUODENOSCOPY N/A 11/01/2015   Procedure: ESOPHAGOGASTRODUODENOSCOPY (EGD);  Surgeon: Manus Gunning, MD;  Location: [facility];  Service: Gastroenterology;  Laterality: N/A;   ESOPHAGOGASTRODUODENOSCOPY (EGD) WITH PROPOFOL N/A 10/30/2015   Procedure: ESOPHAGOGASTRODUODENOSCOPY (EGD) WITH PROPOFOL;  Surgeon: Milus Banister, MD;  Location: [facility];  Service: Endoscopy;  Laterality: N/A;   Patient Active Problem List   Diagnosis Date Noted   History of duodenal ulcer with hemorrhage 03/14/2021   Severe sepsis ([place]) 03/14/2021   Pneumonia 03/14/2021    Sepsis ([place]) 03/14/2021   Syncope 03/23/2017   Bleeding duodenal ulcer    Hypokalemia    Lactic acidosis    Duodenal ulcer hemorrhage 10/31/2015   Urinary retention 10/30/2015   Acute blood loss anemia 10/29/2015   Hypotension 10/29/2015   Syncope and collapse 10/29/2015   Traumatic brain injury with depressed frontal skull fracture ([place]) 09/07/2012   Cognitive deficit as late effect of traumatic brain injury ([place]) 09/07/2012   Episodic dyscontrol syndrome 09/07/2012    REFERRING DIAG: R26.89 (ICD-10-CM) - Other abnormalities of gait and mobility  THERAPY DIAG:  Ataxic gait  Abnormal posture  Rationale for Evaluation and Treatment Rehabilitation  PERTINENT HISTORY: TBI, GI bleed, seizures  PRECAUTIONS: fall  SUBJECTIVE: Pt denies falls or near misses.  He states he has been exercising and walking with caregiver.   PAIN:  Are you having pain? No  TODAY'S TREATMENT:  -STS w/o UE support x10 - removed from HEP -Standing march at countertop x20 - removed from HEP -mini squats at counter x20 -Heel and toe raises x10 w/ repeated demo for eccentric lower and pelvis neutral to prevent compensation -seated hamstring curls w/ red theraband x20 each LE; tactile cues for form -side stepping at counter w/o resistance 2x8' no UE support > side stepping w/ red theraband at thighs -hip hinge against wall to practice anterior weight shift x8 -static feet together  EO >EC 2x45sec each in corner  PATIENT EDUCATION: Education details: Additions to HEP w/ supervision from roommate. Person educated: Patient and Roommate Education method: Customer service manager Education comprehension: verbalized understanding and needs further education   HOME EXERCISE PROGRAM: Access Code: [email or web address] URL: [email or web address] Date: 07/15/2022 Prepared by: [place] with Counter Support  - 1 x daily - 7 x weekly - 3 sets - 10 reps - Standing March  with Counter Support  - 1 x daily - 7 x weekly - 3 sets - 10 reps - Heel Raises with Counter Support  - 1 x daily - 7 x weekly - 3 sets - 10 reps - Heel Toe Raises with Counter Support  - 1 x daily - 7 x weekly - 3 sets - 10 reps - Seated Hamstring Curls with Resistance  - 1 x daily - 5 x weekly - 2 sets - 20 reps - Side Stepping with Resistance at Thighs and Counter Support  - 1 x daily - 5 x weekly - 3 sets - 10 reps - Standing Hip Hinge  - 1 x daily - 5 x weekly - 3 sets - 10 reps - Corner Balance Feet Together With Eyes Open  - 1 x daily - 5 x weekly - 1 sets - 4 reps - 30 sec hold - Corner Balance Feet Together With Eyes Closed  - 1 x daily - 5 x weekly - 1 sets - 4 reps - 20-30 sec hold   GOALS: Goals reviewed with patient? Yes   SHORT TERM GOALS: Target date: 07/09/2022   Pt will be independent with initial HEP for improved balance and functional strength   Baseline: Established and reviewed, pt states compliance. Goal status: MET   2.  Pt will improve 5x STS to </= 16 sec to demo improved functional LE strength and balance  Baseline: 19.63s with no UE, CGA; 16.28 sec w/ no UE support SBA Goal status: PROGRESSING   3.  Pt will improve FGA to >/= 11/30 to demonstrate improved balance and reduced fall risk Baseline: 6/30; 07/09/2022 12/30 Goal status: MET   4.  Patient will be compliant using LRAD at least 75% of the time at home  Baseline: patient does not use AD at baseline; he reports he is still not using an AD as he "does not need that" Goal status: NOT MET     LONG TERM GOALS: Target date: 08/06/2022   Pt will be independent with final HEP for improved balance  Baseline: to be provided Goal status: INITIAL   2.  Pt will improve FGA to >/= 16/30 to demonstrate improved balance and reduced fall risk Baseline: 6/30 Goal status: INITIAL   3.  Pt will improve 5x STS to </= 13 sec to demo improved functional LE strength and balance   Baseline: 19.36S no UE, CGA Goal  status: INITIAL   4.  Patient will be compliant with use of LRAD as recommended 100% of the time Baseline: patient does not use an AD Goal status: INITIAL   5.  Patient will experience <3 near-falls/day Baseline: >10 near falls/day Goal status: INITIAL   ASSESSMENT:   CLINICAL IMPRESSION: Focus of skilled session today on reviewing and advancing HEP to meet current functional status of patient and ensure safe compliance to program.  He continues to demonstrate some mild impulsivity and  retropulsion during standing transfers and ambulation with emphasis of activities on promoting anterior weight shift.  He ambulates  with widened BOS with little carryover noted from prior session.  He would benefit from further skilled intervention to address remaining deficits to optimize functional outcomes prior to discharge.    OBJECTIVE IMPAIRMENTS Abnormal gait, decreased balance, decreased cognition, decreased coordination, decreased endurance, decreased knowledge of condition, decreased knowledge of use of DME, difficulty walking, decreased ROM, decreased safety awareness, and impaired tone.    ACTIVITY LIMITATIONS carrying, lifting, bending, standing, squatting, stairs, transfers, reach over head, and locomotion level   PARTICIPATION LIMITATIONS: meal prep, cleaning, laundry, driving, shopping, community activity, and yard work   PERSONAL FACTORS Behavior pattern, Past/current experiences, Time since onset of injury/illness/exacerbation, and 1-2 comorbidities: GI bleed, seizures  are also affecting patient's functional outcome.    REHAB POTENTIAL: Fair time since onset   CLINICAL DECISION MAKING: Evolving/moderate complexity   EVALUATION COMPLEXITY: Moderate   PLAN: PT FREQUENCY: 1-2x/week   PT DURATION: 8 weeks   PLANNED INTERVENTIONS: Therapeutic exercises, Therapeutic activity, Neuromuscular re-education, Balance training, Gait training, Patient/Family education, Joint mobilization,  Stair training, Vestibular training, Visual/preceptual remediation/compensation, Orthotic/Fit training, DME instructions, Aquatic Therapy, Cognitive remediation, Wheelchair mobility training, Cryotherapy, Moist heat, Manual therapy, and Re-evaluation   PLAN FOR NEXT SESSION:  Modify HEP prn, reinforce safe approach to sitting facing out with reachback, gait w/ LRAD vs none, address gait mechanics-does well with treadmill training, may benefit from ball kicks or incline training, pre gait, SAFETY AWARENESS, balance strategies (rockerboard, standing on foam), work on narrowed [place], PT, DPT Outpatient Neuro [facility] [address], [place], [place] [address] [phone] 07/15/22, 1:42 PM

## 2022-07-16 ENCOUNTER — Ambulatory Visit: Payer: Medicaid Other | Admitting: Physical Therapy

## 2022-07-16 DIAGNOSIS — H5581 Saccadic eye movements: Secondary | ICD-10-CM | POA: Diagnosis not present

## 2022-07-16 DIAGNOSIS — S069XAS Unspecified intracranial injury with loss of consciousness status unknown, sequela: Secondary | ICD-10-CM | POA: Diagnosis not present

## 2022-07-18 ENCOUNTER — Ambulatory Visit: Payer: Medicare Other

## 2022-07-18 DIAGNOSIS — R260 Ataxic gait: Secondary | ICD-10-CM

## 2022-07-18 DIAGNOSIS — R293 Abnormal posture: Secondary | ICD-10-CM

## 2022-07-18 NOTE — Therapy (Signed)
OUTPATIENT PHYSICAL THERAPY TREATMENT NOTE/PROGRESS NOTE   Patient Name: Zachary Thornton MRN: [id number] DOB:10-25-1966, 56 y.o., male Today's Date: 07/18/2022  PCP: Crist Infante, MD REFERRING PROVIDER: Crist Infante, MD  END OF SESSION:   PT End of Session - 07/18/22 1218     Visit Number 11    Number of Visits 17    Date for PT Re-Evaluation 08/06/22    Authorization Type Medicare    PT Start Time 1230    PT Stop Time 1312    PT Time Calculation (min) 42 min    Equipment Utilized During Treatment Gait belt    Activity Tolerance Patient tolerated treatment well    Behavior During Therapy WFL for tasks assessed/performed;Impulsive             Past Medical History:  Diagnosis Date   Back injury    Blood loss anemia    Blood transfusion without reported diagnosis    Dementia with behavioral disturbance (HCC)    Depression    Duodenal ulcer hemorrhage    GI bleed    Headache    Seizures (HCC)    TBI (traumatic brain injury) ([place])    Past Surgical History:  Procedure Laterality Date   BRAIN SURGERY     CRANIOTOMY     Post TBI craniotomy and plate insertion   CSF SHUNT     ESOPHAGOGASTRODUODENOSCOPY N/A 11/01/2015   Procedure: ESOPHAGOGASTRODUODENOSCOPY (EGD);  Surgeon: Manus Gunning, MD;  Location: [facility];  Service: Gastroenterology;  Laterality: N/A;   ESOPHAGOGASTRODUODENOSCOPY (EGD) WITH PROPOFOL N/A 10/30/2015   Procedure: ESOPHAGOGASTRODUODENOSCOPY (EGD) WITH PROPOFOL;  Surgeon: Milus Banister, MD;  [facility]: [facility];  Service: Endoscopy;  Laterality: N/A;   Patient Active Problem List   Diagnosis Date Noted   History of duodenal ulcer with hemorrhage 03/14/2021   Severe sepsis ([place]) 03/14/2021   Pneumonia 03/14/2021   Sepsis ([place]) 03/14/2021   Syncope 03/23/2017   Bleeding duodenal ulcer    Hypokalemia    Lactic acidosis    Duodenal ulcer hemorrhage 10/31/2015   Urinary retention 10/30/2015   Acute blood loss anemia 10/29/2015    Hypotension 10/29/2015   Syncope and collapse 10/29/2015   Traumatic brain injury with depressed frontal skull fracture ([place]) 09/07/2012   Cognitive deficit as late effect of traumatic brain injury ([place]) 09/07/2012   Episodic dyscontrol syndrome 09/07/2012    REFERRING DIAG: R26.89 (ICD-10-CM) - Other abnormalities of gait and mobility  THERAPY DIAG:  Ataxic gait  Abnormal posture  Rationale for Evaluation and Treatment Rehabilitation  PERTINENT HISTORY: TBI, GI bleed, seizures  PRECAUTIONS: fall  SUBJECTIVE: Patient reports doing well. Continues to deny falls/near falls. States that exercises are going well.   PAIN:  Are you having pain? No  TODAY'S TREATMENT:  Gait: -treadmill x5 mins 1.0MPH -treadmill x5 mins 1.64mh up to 3% incline  -treadmill x5 mins 1.355m @4 % incline -x115' with noted good carryover over of narrower BOS and equal B step length   NMR:  -anticipatory/reactionary balance at rebounder with 2kg ball + CGA for balance  -NuStep x8 mins B UE/LE level 4 for increased reciprocal coordination  PATIENT EDUCATION: Education details: continue HEP Person educated: Patient and Roommate Education method: ExCustomer service managerducation comprehension: verbalized understanding and needs further education   HOME EXERCISE PROGRAM: Access Code: [email or web address]: [email or web address] Date: 07/15/2022 Prepared by: MaPasturaith Counter Support  - 1 x daily - 7 x weekly - 3 sets - 10  reps - Standing March with Counter Support  - 1 x daily - 7 x weekly - 3 sets - 10 reps - Heel Raises with Counter Support  - 1 x daily - 7 x weekly - 3 sets - 10 reps - Heel Toe Raises with Counter Support  - 1 x daily - 7 x weekly - 3 sets - 10 reps - Seated Hamstring Curls with Resistance  - 1 x daily - 5 x weekly - 2 sets - 20 reps - Side Stepping with Resistance at Thighs and Counter Support  - 1 x daily - 5 x weekly - 3 sets -  10 reps - Standing Hip Hinge  - 1 x daily - 5 x weekly - 3 sets - 10 reps - Corner Balance Feet Together With Eyes Open  - 1 x daily - 5 x weekly - 1 sets - 4 reps - 30 sec hold - Corner Balance Feet Together With Eyes Closed  - 1 x daily - 5 x weekly - 1 sets - 4 reps - 20-30 sec hold   GOALS: Goals reviewed with patient? Yes   SHORT TERM GOALS: Target date: 07/09/2022   Pt will be independent with initial HEP for improved balance and functional strength   Baseline: Established and reviewed, pt states compliance. Goal status: MET   2.  Pt will improve 5x STS to </= 16 sec to demo improved functional LE strength and balance  Baseline: 19.63s with no UE, CGA; 16.28 sec w/ no UE support SBA Goal status: PROGRESSING   3.  Pt will improve FGA to >/= 11/30 to demonstrate improved balance and reduced fall risk Baseline: 6/30; 07/09/2022 12/30 Goal status: MET   4.  Patient will be compliant using LRAD at least 75% of the time at home  Baseline: patient does not use AD at baseline; he reports he is still not using an AD as he "does not need that" Goal status: NOT MET     LONG TERM GOALS: Target date: 08/06/2022   Pt will be independent with final HEP for improved balance  Baseline: to be provided Goal status: INITIAL   2.  Pt will improve FGA to >/= 16/30 to demonstrate improved balance and reduced fall risk Baseline: 6/30 Goal status: INITIAL   3.  Pt will improve 5x STS to </= 13 sec to demo improved functional LE strength and balance   Baseline: 19.36S no UE, CGA Goal status: INITIAL   4.  Patient will be compliant with use of LRAD as recommended 100% of the time Baseline: patient does not use an AD Goal status: INITIAL   5.  Patient will experience <3 near-falls/day Baseline: >10 near falls/day Goal status: INITIAL   ASSESSMENT:   CLINICAL IMPRESSION: Patient seen for skilled PT session with emphasis on gait mechanics re-training and NMR. He BOS has improved from very  wide to slightly wider that typical. His general stability appears to have improved; however, he remains impulsive and with questionable safety awareness at times furthering his risk for falls. Delayed and inadequate reactionary/anticipatory balance noted at rebounder, but no overt LOB. Difficulty coordinating bimanual task of catching weighted ball at times. Continue POC.     OBJECTIVE IMPAIRMENTS Abnormal gait, decreased balance, decreased cognition, decreased coordination, decreased endurance, decreased knowledge of condition, decreased knowledge of use of DME, difficulty walking, decreased ROM, decreased safety awareness, and impaired tone.    ACTIVITY LIMITATIONS carrying, lifting, bending, standing, squatting, stairs, transfers, reach over head, and  locomotion level   PARTICIPATION LIMITATIONS: meal prep, cleaning, laundry, driving, shopping, community activity, and yard work   PERSONAL FACTORS Behavior pattern, Past/current experiences, Time since onset of injury/illness/exacerbation, and 1-2 comorbidities: GI bleed, seizures  are also affecting patient's functional outcome.    REHAB POTENTIAL: Fair time since onset   CLINICAL DECISION MAKING: Evolving/moderate complexity   EVALUATION COMPLEXITY: Moderate   PLAN: PT FREQUENCY: 1-2x/week   PT DURATION: 8 weeks   PLANNED INTERVENTIONS: Therapeutic exercises, Therapeutic activity, Neuromuscular re-education, Balance training, Gait training, Patient/Family education, Joint mobilization, Stair training, Vestibular training, Visual/preceptual remediation/compensation, Orthotic/Fit training, DME instructions, Aquatic Therapy, Cognitive remediation, Wheelchair mobility training, Cryotherapy, Moist heat, Manual therapy, and Re-evaluation   PLAN FOR NEXT SESSION:  Modify HEP prn, reinforce safe approach to sitting facing out with reachback, gait w/ LRAD vs none, address gait mechanics-does well with treadmill training, may benefit from ball  kicks or incline training, pre gait, SAFETY AWARENESS, balance strategies (rockerboard, standing on foam), work on narrowed BOS   Debbora Dus, PT, DPT, CBIS  07/18/22, 1:15 PM

## 2022-07-23 ENCOUNTER — Ambulatory Visit: Payer: Medicare Other | Attending: Internal Medicine

## 2022-07-23 DIAGNOSIS — R260 Ataxic gait: Secondary | ICD-10-CM | POA: Insufficient documentation

## 2022-07-23 DIAGNOSIS — R293 Abnormal posture: Secondary | ICD-10-CM | POA: Insufficient documentation

## 2022-07-23 NOTE — Therapy (Signed)
OUTPATIENT PHYSICAL THERAPY TREATMENT NOTE/PROGRESS NOTE   Patient Name: Zachary Thornton MRN: [id number] DOB:Jun 28, 1966, 56 y.o., male Today's Date: 07/23/2022  PCP: Crist Infante, MD REFERRING PROVIDER: Crist Infante, MD  END OF SESSION:   PT End of Session - 07/23/22 1319     Visit Number 12    Number of Visits 17    Date for PT Re-Evaluation 08/06/22    Authorization Type Medicare    PT Start Time 1316    PT Stop Time 1357    PT Time Calculation (min) 41 min    Equipment Utilized During Treatment Gait belt    Activity Tolerance Patient tolerated treatment well    Behavior During Therapy WFL for tasks assessed/performed;Impulsive             Past Medical History:  Diagnosis Date   Back injury    Blood loss anemia    Blood transfusion without reported diagnosis    Dementia with behavioral disturbance (HCC)    Depression    Duodenal ulcer hemorrhage    GI bleed    Headache    Seizures (HCC)    TBI (traumatic brain injury) ([place])    Past Surgical History:  Procedure Laterality Date   BRAIN SURGERY     CRANIOTOMY     Post TBI craniotomy and plate insertion   CSF SHUNT     ESOPHAGOGASTRODUODENOSCOPY N/A 11/01/2015   Procedure: ESOPHAGOGASTRODUODENOSCOPY (EGD);  Surgeon: Manus Gunning, MD;  Location: [facility];  Service: Gastroenterology;  Laterality: N/A;   ESOPHAGOGASTRODUODENOSCOPY (EGD) WITH PROPOFOL N/A 10/30/2015   Procedure: ESOPHAGOGASTRODUODENOSCOPY (EGD) WITH PROPOFOL;  Surgeon: Milus Banister, MD;  [facility];  Service: Endoscopy;  Laterality: N/A;   Patient Active Problem List   Diagnosis Date Noted   History of duodenal ulcer with hemorrhage 03/14/2021   Severe sepsis ([place]) 03/14/2021   Pneumonia 03/14/2021   Sepsis ([place]) 03/14/2021   Syncope 03/23/2017   Bleeding duodenal ulcer    Hypokalemia    Lactic acidosis    Duodenal ulcer hemorrhage 10/31/2015   Urinary retention 10/30/2015   Acute blood loss anemia 10/29/2015    Hypotension 10/29/2015   Syncope and collapse 10/29/2015   Traumatic brain injury with depressed frontal skull fracture ([place]) 09/07/2012   Cognitive deficit as late effect of traumatic brain injury ([place]) 09/07/2012   Episodic dyscontrol syndrome 09/07/2012    REFERRING DIAG: R26.89 (ICD-10-CM) - Other abnormalities of gait and mobility  THERAPY DIAG:  Ataxic gait  Abnormal posture  Rationale for Evaluation and Treatment Rehabilitation  PERTINENT HISTORY: TBI, GI bleed, seizures  PRECAUTIONS: fall  SUBJECTIVE: Patient reports doing well. Continues to deny falls/near falls.   PAIN:  Are you having pain? No  TODAY'S TREATMENT:  NMR:  -[place] x10 mins BUE/LE  -blocked practice short distance gait-> turning safely to sit   Gait:  -Treadmill total of 10 mins with 1 seated rest break (up to 1.73mh and 3% incline)  -230' overground gait with noted good carryover of narrower BOS and reciprocal arm swing   PATIENT EDUCATION: Education details: continue HEP, safe sequencing for turning to sit  Person educated: Patient and Roommate Education method: ECustomer service managerEducation comprehension: verbalized understanding and needs further education   HOME EXERCISE PROGRAM: Access Code: [email or web address]: [email or web address] Date: 07/15/2022 Prepared by: [place] Counter Support  - 1 x daily - 7 x weekly - 3 sets - 10 reps - Standing March with Counter Support  -  1 x daily - 7 x weekly - 3 sets - 10 reps - Heel Raises with Counter Support  - 1 x daily - 7 x weekly - 3 sets - 10 reps - Heel Toe Raises with Counter Support  - 1 x daily - 7 x weekly - 3 sets - 10 reps - Seated Hamstring Curls with Resistance  - 1 x daily - 5 x weekly - 2 sets - 20 reps - Side Stepping with Resistance at Thighs and Counter Support  - 1 x daily - 5 x weekly - 3 sets - 10 reps - Standing Hip Hinge  - 1 x daily - 5 x weekly - 3 sets - 10  reps - Corner Balance Feet Together With Eyes Open  - 1 x daily - 5 x weekly - 1 sets - 4 reps - 30 sec hold - Corner Balance Feet Together With Eyes Closed  - 1 x daily - 5 x weekly - 1 sets - 4 reps - 20-30 sec hold   GOALS: Goals reviewed with patient? Yes   SHORT TERM GOALS: Target date: 07/09/2022   Pt will be independent with initial HEP for improved balance and functional strength   Baseline: Established and reviewed, pt states compliance. Goal status: MET   2.  Pt will improve 5x STS to </= 16 sec to demo improved functional LE strength and balance  Baseline: 19.63s with no UE, CGA; 16.28 sec w/ no UE support SBA Goal status: PROGRESSING   3.  Pt will improve FGA to >/= 11/30 to demonstrate improved balance and reduced fall risk Baseline: 6/30; 07/09/2022 12/30 Goal status: MET   4.  Patient will be compliant using LRAD at least 75% of the time at home  Baseline: patient does not use AD at baseline; he reports he is still not using an AD as he "does not need that" Goal status: NOT MET     LONG TERM GOALS: Target date: 08/06/2022   Pt will be independent with final HEP for improved balance  Baseline: to be provided Goal status: INITIAL   2.  Pt will improve FGA to >/= 16/30 to demonstrate improved balance and reduced fall risk Baseline: 6/30 Goal status: INITIAL   3.  Pt will improve 5x STS to </= 13 sec to demo improved functional LE strength and balance   Baseline: 19.36S no UE, CGA Goal status: INITIAL   4.  Patient will be compliant with use of LRAD as recommended 100% of the time Baseline: patient does not use an AD Goal status: INITIAL   5.  Patient will experience <3 near-falls/day Baseline: >10 near falls/day Goal status: INITIAL   ASSESSMENT:   CLINICAL IMPRESSION: Patient seen for skilled PT session with emphasis on reciprocal coordination and gait re-training. He continues to demonstrate some carryover of narrower BOS with gait and improved reciprocal  arm swing, but with minimal dual tasking, patient resorting to no R UE swing and WBOS. Responsive to blocked practice gait + STS. Continue POC.     OBJECTIVE IMPAIRMENTS Abnormal gait, decreased balance, decreased cognition, decreased coordination, decreased endurance, decreased knowledge of condition, decreased knowledge of use of DME, difficulty walking, decreased ROM, decreased safety awareness, and impaired tone.    ACTIVITY LIMITATIONS carrying, lifting, bending, standing, squatting, stairs, transfers, reach over head, and locomotion level   PARTICIPATION LIMITATIONS: meal prep, cleaning, laundry, driving, shopping, community activity, and yard work   PERSONAL FACTORS Behavior pattern, Past/current experiences, Time since onset of  injury/illness/exacerbation, and 1-2 comorbidities: GI bleed, seizures  are also affecting patient's functional outcome.    REHAB POTENTIAL: Fair time since onset   CLINICAL DECISION MAKING: Evolving/moderate complexity   EVALUATION COMPLEXITY: Moderate   PLAN: PT FREQUENCY: 1-2x/week   PT DURATION: 8 weeks   PLANNED INTERVENTIONS: Therapeutic exercises, Therapeutic activity, Neuromuscular re-education, Balance training, Gait training, Patient/Family education, Joint mobilization, Stair training, Vestibular training, Visual/preceptual remediation/compensation, Orthotic/Fit training, DME instructions, Aquatic Therapy, Cognitive remediation, Wheelchair mobility training, Cryotherapy, Moist heat, Manual therapy, and Re-evaluation   PLAN FOR NEXT SESSION:  Modify HEP prn, reinforce safe approach to sitting facing out with reachback, gait w/ LRAD vs none, address gait mechanics-does well with treadmill training, may benefit from ball kicks or incline training, pre gait, SAFETY AWARENESS, balance strategies (rockerboard, standing on foam), work on narrowed BOS   Debbora Dus, PT, DPT, CBIS  07/23/22, 2:00 PM

## 2022-07-24 ENCOUNTER — Telehealth: Payer: Self-pay | Admitting: Neurology

## 2022-07-24 ENCOUNTER — Other Ambulatory Visit: Payer: Self-pay | Admitting: Neurology

## 2022-07-24 MED ORDER — LAMOTRIGINE 25 MG PO TABS: ORAL_TABLET | ORAL | 1 refills | Status: DC

## 2022-07-24 MED ORDER — LAMOTRIGINE 200 MG PO TABS: 200.0000 mg | ORAL_TABLET | Freq: Two times a day (BID) | ORAL | 1 refills | Status: DC

## 2022-07-24 NOTE — Telephone Encounter (Addendum)
Patient needs a refill on lamictal.  He uses [facility].

## 2022-07-24 NOTE — Telephone Encounter (Signed)
done

## 2022-07-25 ENCOUNTER — Ambulatory Visit: Payer: Medicare Other

## 2022-07-30 ENCOUNTER — Ambulatory Visit: Payer: Medicare Other

## 2022-07-30 NOTE — Therapy (Signed)
[facility] [address] [place], [place], [address] Phone: [phone]   Fax:  [phone]  Patient Details  Name: Zachary Thornton MRN: [id number] Date of Birth: 02-23-1966 Referring Provider:  No ref. provider found  Encounter Date: 07/30/2022  PHYSICAL THERAPY NON-VISIT DISCHARGE SUMMARY  Visits from Start of Care: 12  Current functional level related to goals / functional outcomes: Patient remains a significant fall risk with all mobility. He has poor safety awareness, limited carryover of instruction and blocked practice and poor balance strategies.    Remaining deficits: At last assessment:   [facility] PT Assessment - 07/30/22 1225       Functional Gait  Assessment   Gait assessed  Yes    Gait Level Surface Walks 20 ft in less than 7 sec but greater than 5.5 sec, uses assistive device, slower speed, mild gait deviations, or deviates 6-10 in outside of the 12 in walkway width.    Change in Gait Speed Able to change speed, demonstrates mild gait deviations, deviates 6-10 in outside of the 12 in walkway width, or no gait deviations, unable to achieve a major change in velocity, or uses a change in velocity, or uses an assistive device.    Gait with Horizontal Head Turns Performs head turns with moderate changes in gait velocity, slows down, deviates 10-15 in outside 12 in walkway width but recovers, can continue to walk.    Gait with Vertical Head Turns Performs task with slight change in gait velocity (eg, minor disruption to smooth gait path), deviates 6 - 10 in outside 12 in walkway width or uses assistive device    Gait and Pivot Turn Turns slowly, requires verbal cueing, or requires several small steps to catch balance following turn and stop    Step Over Obstacle Is able to step over one shoe box (4.5 in total height) but must slow down and adjust steps to clear box safely. May require verbal cueing.    Gait with Narrow Base  of Support Ambulates less than 4 steps heel to toe or cannot perform without assistance.   lateral LOB   Gait with Eyes Closed Cannot walk 20 ft without assistance, severe gait deviations or imbalance, deviates greater than 15 in outside 12 in walkway width or will not attempt task.   significant sway with crossover step, modA to recover   Ambulating Backwards Walks 20 ft, slow speed, abnormal gait pattern, evidence for imbalance, deviates 10-15 in outside 12 in walkway width.    Steps Alternating feet, must use rail.    Total Score 12               Education / Equipment: PT POC, HEP, fall precautions, general safety awareness   Patient agrees to discharge. Patient goals were  Unable to be assessed due to patients mother electing to dc patient . Patient is being discharged due to  patients mothers request.   GOALS: Goals reviewed with patient? Yes   SHORT TERM GOALS: Target date: 07/09/2022   Pt will be independent with initial HEP for improved balance and functional strength   Baseline: Established and reviewed, pt states compliance. Goal status: MET   2.  Pt will improve 5x STS to </= 16 sec to demo improved functional LE strength and balance  Baseline: 19.63s with no UE, CGA; 16.28 sec w/ no UE support SBA Goal status: PROGRESSING   3.  Pt will improve FGA to >/= 11/30 to demonstrate improved balance  and reduced fall risk Baseline: 6/30; 07/09/2022 12/30 Goal status: MET   4.  Patient will be compliant using LRAD at least 75% of the time at home  Baseline: patient does not use AD at baseline; he reports he is still not using an AD as he "does not need that" Goal status: NOT MET     LONG TERM GOALS: Target date: 08/06/2022   Pt will be independent with final HEP for improved balance  Baseline: to be provided Goal status: INITIAL   2.  Pt will improve FGA to >/= 16/30 to demonstrate improved balance and reduced fall risk Baseline: 6/30 Goal status: INITIAL   3.  Pt  will improve 5x STS to </= 13 sec to demo improved functional LE strength and balance   Baseline: 19.36S no UE, CGA Goal status: INITIAL   4.  Patient will be compliant with use of LRAD as recommended 100% of the time Baseline: patient does not use an AD Goal status: INITIAL   5.  Patient will experience <3 near-falls/day Baseline: >10 near falls/day Goal status: INITIAL Debbora Dus, PT Debbora Dus, PT, DPT, CBIS  07/30/2022, 12:11 PM  [facility] [address] [place], [place], [address] Phone: [phone]   Fax:  [phone]

## 2022-08-01 ENCOUNTER — Ambulatory Visit: Payer: Medicare Other | Admitting: Physical Therapy

## 2022-08-06 ENCOUNTER — Ambulatory Visit: Payer: Medicaid Other

## 2022-08-08 ENCOUNTER — Ambulatory Visit: Payer: Medicaid Other | Admitting: Physical Therapy

## 2022-08-13 ENCOUNTER — Other Ambulatory Visit: Payer: Self-pay | Admitting: Neurology

## 2022-09-10 NOTE — Progress Notes (Signed)
NEUROLOGY FOLLOW UP OFFICE NOTE  Zachary Thornton [id number]  Assessment/Plan:   1  Symptomatic focal onset seizures with impaired consciousness as late effect of traumatic brain injury with breakthrough seizures x2.  No provoking factor known. 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   Subjective:  Zachary Thornton is a 56 year old man with traumatic brain injury and history of GI bleed who follows up for seizure disorder.  He is accompanied by a family friend/caregiver whom he lives with supplements history.   UPDATE: Current medications: Lamotrigine 250 mg twice daily, carbamazepine XR 300 mg in a.m. and 600 mg in p.m (600mg  twice daily caused hyponatremia).   On 03/31/2022, he was sitting at the table when he suddenly stiffened up and became unresponsive.  He exhibited shaking activity for a couple of minutes.  No tongue biting, incontinence or postictal confusion.   Lamotrigine level was 6.0.  Carbamazepine level was 8.2.  CT head on 04/08/2022 personally reviewed revealed extensive posttraumatic encephalomalacia but o acute findings.  Lamotrigine was increased to 250mg  BID.  No subsequent seizures.  Mood is improved as well.  He went to PT for balance which has not improved.  He would squint his left eye when he reads.  He saw ophthalmology and had a prism added to glasses but he still squints when he reads.       HISTORY: He had a traumatic brain injury in 2012 after falling and hitting the back of his head on concrete.  He fractured his skull and sustained intracranial bleeding, requiring right craniectomy and VP shunt.  He was in a coma for 6 weeks.  After 2 month hospitalization, he was discharged to inpatient rehab facility where he had to relearn how to walk, talk and feed himself.  His wife was  unable to care for him and he subsequently moved in with his mother, who is his guardian.  His wife and son live in [place].   He was hospitalized in December for GI bleed due to duodenal ulcer, requiring multiple blood transfusions.   He presented to the ED on 04/29/16 following an unwitnessed fall in which he hit his head.  He did not recall the event.  His mother heard him fall in the bathroom and found him on the floor with eyes rolled back, unresponsive with flexed posturing and shaking of both upper extremities, lasting about 2 minutes.  He had bowel and bladder incontinence.  He was slightly confused afterwards for about 10 minutes.  He did not exhibit foaming at the mouth or tongue biting.  CT of head was personally reviewed and showed chronic changes of traumatic brain injury with bifrontal and temporal encephalomalacia, as well as post surgical changes of right craniectomy with cranioplasty and left frontal approach VP shunt.  No evidence of hydrocephalus seen.  He was started on Keppra.  He has not had any recurrent spells.  He was found to have worsening anemia and received further transfusions by his PCP.  He does not drive.     He was admitted to [facility] from 03/23/17 to 03/24/17 for syncope.  He had a brief syncopal episode at church.  His face became pale.  He did not exhibit seizure-like activity.  It was brief.  He was found to be hypotensive by  EMS with a BP of 60/30 and was given IV fluids, which improved blood pressure.  He was not orthostatic in the hospital.  Work up in the hospital included head CT with no acute findings, EEG with right hemispheric slowing (which could be due to underlying physiologic abnormality from encephalomalacia and surgery), but no epileptiform discharges, and unremarkable EKG and troponins.  My suspicion is that this was not a seizure.   He had a seizure on 12/10/17, described as staring spell of unresponsiveness followed by generalized  tonic-clonic activity with urinary incontinence.  ED note mentions he was not taking Keppra, but his caregiver disputes this.  Carbamazepine level was 12.6.  Na was 131, K 4 and glucose 118.  UA was negative for infection.  He was loaded with Keppra and discharged on Keppra 1000mg  twice daily.  Since then, he has had increased irritability and verbally abusive, so Keppra was discontinued.   In 2022, he has had increased falls and change in behavior.  CT head performed on 07/23/2021 following a fall was personally reviewed and revealed no acute abnormalities.  He needs more supervision.  He cannot blow dry his hair as he had the blow dryer to close to his scalp causing his hair to burn.  He also can no longer clean his ears himself as he once pushed the cerumen far into his canal with the Q-tip, requiring him to see his PCP to have it disimpacted. Once his toilet clogged. Instead of telling somebody, he took the used toilet paper from the toilet and threw them out of the window.  No agitation.  He has some sores on his scalp, presumably due to repeated scratching of his head.     Past medication:  Keppra (irritability)   He has no history of seizures prior to 2017.  PAST MEDICAL HISTORY: Past Medical History:  Diagnosis Date   Back injury    Blood loss anemia    Blood transfusion without reported diagnosis    Dementia with behavioral disturbance (HCC)    Depression    Duodenal ulcer hemorrhage    GI bleed    Headache    Seizures (HCC)    TBI (traumatic brain injury) (HCC)     MEDICATIONS: Current Outpatient Medications on File Prior to Visit  Medication Sig Dispense Refill   atorvastatin (LIPITOR) 20 MG tablet Take 20 mg by mouth daily.     carbamazepine (CARBATROL) 300 MG 12 hr capsule TAKE ONE CAPSULE IN THE MORNING AND TWO CAPSULES IN THE EVENING. 270 capsule 3   Cholecalciferol (VITAMIN D3) 100000 UNIT/GM POWD one capsule PO daily     Docusate Calcium (STOOL SOFTENER PO) Take 2 tablets  by mouth at bedtime.     escitalopram (LEXAPRO) 20 MG tablet      ferrous sulfate 325 (65 FE) MG tablet Take 1 tablet (325 mg total) by mouth daily with breakfast. 30 tablet 3   lamoTRIgine (LAMICTAL) 200 MG tablet Take 1 tablet (200 mg total) by mouth 2 (two) times daily. 180 tablet 1   lamoTRIgine (LAMICTAL) 25 MG tablet TAKE TWO TABLETS BY MOUTH TWICE DAILY ALONG WITH 200MG  TABLET. 360 tablet 1   Multiple Vitamin (MULTIVITAMIN WITH MINERALS) TABS Take 1 tablet by mouth daily.     Multiple Vitamin (MULTIVITAMIN) tablet Take 1 tablet by mouth daily.     pantoprazole (PROTONIX) 40 MG tablet TAKE 1 TABLET TWICE DAILY. (Patient taking differently: Take 40 mg by mouth 2 (two) times daily.) 60 tablet 0  tamsulosin (FLOMAX) 0.4 MG CAPS capsule Take 1 capsule (0.4 mg total) by mouth daily after breakfast. 30 capsule 0   vitamin B-12 100 MCG tablet Take 1 tablet (100 mcg total) by mouth daily. 30 tablet 1   zinc sulfate 220 (50 Zn) MG capsule Take 220 mg by mouth daily.     No current facility-administered medications on file prior to visit.    ALLERGIES: Allergies  Allergen Reactions   Nuedexta [Dextromethorphan-Quinidine] Anaphylaxis    Lips began to swell   Nsaids Other (See Comments)    Due to GI bleeding    FAMILY HISTORY: Family History  Problem Relation Age of Onset   Hypertension Mother    Heart disease Father        MVR   Stroke Maternal Aunt    Colitis Neg Hx    Esophageal cancer Neg Hx    Rectal cancer Neg Hx    Stomach cancer Neg Hx       Objective:  Blood pressure 136/88, pulse (!) 102, height 5\' 8"  (1.727 m), weight 177 lb (80.3 kg), SpO2 94 %. General: No acute distress.  Patient appears well-groomed.   Head:  Normocephalic/atraumatic Eyes:  Fundi examined but not visualized Heart:  Regular rate and rhythm Neurological Exam: alert and oriented to person and place.  Paucity of speech but fluent and not dysarthric, language intact.  End-gaze horizontal nystagmus  with some difficulty tracking.  Otherwise, CN II-XII intact. Bulk and tone normal, muscle strength 5/5 throughout.  Sensation to light touch intact.  Deep tendon reflexes 2+ throughout, toes downgoing.  Finger to nose testing with some dysmetria. Broad-based spastic gait, Romberg with mild sway   Metta Clines, DO  CC: Crist Infante, MD

## 2022-09-12 ENCOUNTER — Encounter: Payer: Self-pay | Admitting: Neurology

## 2022-09-12 ENCOUNTER — Other Ambulatory Visit (INDEPENDENT_AMBULATORY_CARE_PROVIDER_SITE_OTHER): Payer: Medicare Other

## 2022-09-12 ENCOUNTER — Ambulatory Visit (INDEPENDENT_AMBULATORY_CARE_PROVIDER_SITE_OTHER): Payer: Medicare Other | Admitting: Neurology

## 2022-09-12 VITALS — BP 136/88 | HR 102 | Ht 68.0 in | Wt 177.0 lb

## 2022-09-12 DIAGNOSIS — Z79899 Other long term (current) drug therapy: Secondary | ICD-10-CM

## 2022-09-12 DIAGNOSIS — G40109 Localization-related (focal) (partial) symptomatic epilepsy and epileptic syndromes with simple partial seizures, not intractable, without status epilepticus: Secondary | ICD-10-CM

## 2022-09-12 DIAGNOSIS — F02818 Dementia in other diseases classified elsewhere, unspecified severity, with other behavioral disturbance: Secondary | ICD-10-CM | POA: Diagnosis not present

## 2022-09-12 DIAGNOSIS — S069X9S Unspecified intracranial injury with loss of consciousness of unspecified duration, sequela: Secondary | ICD-10-CM

## 2022-09-12 LAB — BASIC METABOLIC PANEL
BUN: 11 mg/dL (ref 6–23)
CO2: 30 mEq/L (ref 19–32)
Calcium: 9.6 mg/dL (ref 8.4–10.5)
Chloride: 98 mEq/L (ref 96–112)
Creatinine, Ser: 0.89 mg/dL (ref 0.40–1.50)
GFR: 95.95 mL/min (ref >60.00)
Glucose, Bld: 90 mg/dL (ref 70–99)
Potassium: 4.9 mEq/L (ref 3.5–5.1)
Sodium: 134 mEq/L — ABNORMAL LOW (ref 135–145)

## 2022-09-12 NOTE — Patient Instructions (Addendum)
Continue carbamazepine XR 300mg  in morning and 600mg  at night AND lamotrigine 250mg  twice daily Check BMP and lamotrigine level Follow up 6 months.

## 2022-09-14 LAB — LAMOTRIGINE LEVEL: Lamotrigine Lvl: 5.8 ug/mL (ref 2.5–15.0)

## 2022-12-17 ENCOUNTER — Other Ambulatory Visit: Payer: Self-pay | Admitting: Neurology

## 2022-12-25 DIAGNOSIS — Z23 Encounter for immunization: Secondary | ICD-10-CM | POA: Diagnosis not present

## 2023-02-05 ENCOUNTER — Ambulatory Visit: Payer: PPO | Admitting: Neurology

## 2023-02-09 ENCOUNTER — Other Ambulatory Visit: Payer: Self-pay | Admitting: Neurology

## 2023-02-10 NOTE — Telephone Encounter (Signed)
Enough given until follow up with Dr. Tomi Likens

## 2023-02-17 ENCOUNTER — Other Ambulatory Visit: Payer: Self-pay | Admitting: Neurology

## 2023-02-19 NOTE — Telephone Encounter (Signed)
1. Which medications need refilled? (List name and dosage, if known) carmazapine  2. Which pharmacy/location is medication to be sent to? (include street and city if local pharmacy) [facility]  3. Do they need a 30 day or 90 day supply? [place]

## 2023-02-20 ENCOUNTER — Telehealth: Payer: Self-pay

## 2023-02-20 MED ORDER — LAMOTRIGINE 25 MG PO TABS: ORAL_TABLET | ORAL | 0 refills | Status: DC

## 2023-02-20 NOTE — Telephone Encounter (Signed)
Per Dr.Jaffe, OK.  Please send refill for 240 tablets.

## 2023-02-20 NOTE — Telephone Encounter (Signed)
Morey Hummingbird at [facility] is calling in states that the patient needs a prescription of lamoTRIgine (LAMICTAL) 25 MG tablet. Last refill was 120 tablets but patient usually gets 240 which will last him 60 days. Asked if we can send this in.

## 2023-02-26 DIAGNOSIS — H6123 Impacted cerumen, bilateral: Secondary | ICD-10-CM | POA: Diagnosis not present

## 2023-02-26 DIAGNOSIS — H9193 Unspecified hearing loss, bilateral: Secondary | ICD-10-CM | POA: Diagnosis not present

## 2023-03-07 DIAGNOSIS — D649 Anemia, unspecified: Secondary | ICD-10-CM | POA: Diagnosis not present

## 2023-03-07 DIAGNOSIS — Z125 Encounter for screening for malignant neoplasm of prostate: Secondary | ICD-10-CM | POA: Diagnosis not present

## 2023-03-07 DIAGNOSIS — R569 Unspecified convulsions: Secondary | ICD-10-CM | POA: Diagnosis not present

## 2023-03-07 DIAGNOSIS — R2689 Other abnormalities of gait and mobility: Secondary | ICD-10-CM | POA: Diagnosis not present

## 2023-03-07 DIAGNOSIS — E559 Vitamin D deficiency, unspecified: Secondary | ICD-10-CM | POA: Diagnosis not present

## 2023-03-07 DIAGNOSIS — H9193 Unspecified hearing loss, bilateral: Secondary | ICD-10-CM | POA: Diagnosis not present

## 2023-03-07 DIAGNOSIS — E785 Hyperlipidemia, unspecified: Secondary | ICD-10-CM | POA: Diagnosis not present

## 2023-03-07 DIAGNOSIS — Z982 Presence of cerebrospinal fluid drainage device: Secondary | ICD-10-CM | POA: Diagnosis not present

## 2023-03-07 DIAGNOSIS — R82998 Other abnormal findings in urine: Secondary | ICD-10-CM | POA: Diagnosis not present

## 2023-03-07 DIAGNOSIS — S069X0S Unspecified intracranial injury without loss of consciousness, sequela: Secondary | ICD-10-CM | POA: Diagnosis not present

## 2023-03-07 DIAGNOSIS — F419 Anxiety disorder, unspecified: Secondary | ICD-10-CM | POA: Diagnosis not present

## 2023-03-07 DIAGNOSIS — T783XXA Angioneurotic edema, initial encounter: Secondary | ICD-10-CM | POA: Diagnosis not present

## 2023-03-07 DIAGNOSIS — R7989 Other specified abnormal findings of blood chemistry: Secondary | ICD-10-CM | POA: Diagnosis not present

## 2023-03-07 NOTE — Progress Notes (Unsigned)
NEUROLOGY FOLLOW UP OFFICE NOTE  AEVIN BERNHEIM [id number]  Assessment/Plan:   1  Symptomatic focal onset seizures with impaired consciousness as late effect of traumatic brain injury with breakthrough seizures x2.  No provoking factor known. 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  He is accompanied by a family friend/caregiver whom he lives with supplements history.   UPDATE: Current medications: Lamotrigine 250 mg twice daily, carbamazepine XR 300 mg in a.m. and 600 mg in p.m (600mg  twice daily caused hyponatremia).   Last seizure:  03/31/2022 ***   09/14/2022 LABS:  BMP with Na 134, K 4.9, Cl 98, CO2 30, glucose 90, BUN 11, Cr 0.89; lamotrigine level 5.8.   HISTORY: He had a traumatic brain injury in 2012 after falling and hitting the back of his head on concrete.  He fractured his skull and sustained intracranial bleeding, requiring right craniectomy and VP shunt. [name]  He was in a coma for 6 weeks.  After 2 month hospitalization, he was discharged to inpatient rehab facility where he had to relearn how to walk, talk and feed himself.  His wife was unable to care for him and he subsequently moved in with his mother, who is his guardian.  His wife and son live in [place].   He was hospitalized in December for GI bleed due to duodenal ulcer, requiring multiple blood transfusions.   He presented to the ED on 04/29/16 following an unwitnessed fall in which he hit his head.  He did not recall the event.  His mother heard him fall in the bathroom and found him on the floor with eyes rolled back, unresponsive with flexed posturing and shaking of both upper extremities, lasting about 2 minutes.  He had bowel and  bladder incontinence.  He was slightly confused afterwards for about 10 minutes.  He did not exhibit foaming at the mouth or tongue biting.  CT of head was personally reviewed and showed chronic changes of traumatic brain injury with bifrontal and temporal encephalomalacia, as well as post surgical changes of right craniectomy with cranioplasty and left frontal approach VP shunt.  No evidence of hydrocephalus seen.  He was started on Keppra.  He has not had any recurrent spells.  He was found to have worsening anemia and received further transfusions by his PCP.  He does not drive.     He was admitted to [facility] from 03/23/17 to 03/24/17 for syncope.  He had a brief syncopal episode at church.  His face became pale.  He did not exhibit seizure-like activity.  It was brief.  He was found to be hypotensive by EMS with a BP of 60/30 and was given IV fluids, which improved blood pressure.  He was not orthostatic in the hospital.  Work up in the hospital included head CT with no acute findings, EEG with right hemispheric slowing (which could be due to underlying physiologic abnormality from encephalomalacia and surgery), but no epileptiform discharges, and unremarkable EKG and troponins.  My suspicion is that this was not a seizure.   He had a seizure on 12/10/17, described as staring spell of unresponsiveness followed by generalized tonic-clonic activity with urinary incontinence.  ED note mentions  he was not taking Keppra, but his caregiver disputes this.  Carbamazepine level was 12.6.  Na was 131, K 4 and glucose 118.  UA was negative for infection.  He was loaded with Keppra and discharged on Keppra 1000mg  twice daily.  Since then, he has had increased irritability and verbally abusive, so Keppra was discontinued.   In 2022, he has had increased falls and change in behavior.  CT head performed on 07/23/2021 following a fall was personally reviewed and revealed no acute abnormalities.  He needs  more supervision.  He cannot blow dry his hair as he had the blow dryer to close to his scalp causing his hair to burn.  He also can no longer clean his ears himself as he once pushed the cerumen far into his canal with the Q-tip, requiring him to see his PCP to have it disimpacted. Once his toilet clogged. Instead of telling somebody, he took the used toilet paper from the toilet and threw them out of the window.  No agitation.  He has some sores on his scalp, presumably due to repeated scratching of his head.    On 03/31/2022, he was sitting at the table when he suddenly stiffened up and became unresponsive.  He exhibited shaking activity for a couple of minutes.  No tongue biting, incontinence or postictal confusion.   Lamotrigine level was 6.0.  Carbamazepine level was 8.2.  CT head on 04/08/2022 personally reviewed revealed extensive posttraumatic encephalomalacia but o acute findings.  Lamotrigine was increased to 250mg  BID.  No subsequent seizures.  Mood is improved as well.  He went to PT for balance which has not improved.  He would squint his left eye when he reads.  He saw ophthalmology and had a prism added to glasses but he still squints when he reads.     Past medication:  Keppra (irritability)   He has no history of seizures prior to 2017.  PAST MEDICAL HISTORY: Past Medical History:  Diagnosis Date   Back injury    Blood loss anemia    Blood transfusion without reported diagnosis    Dementia with behavioral disturbance (HCC)    Depression    Duodenal ulcer hemorrhage    GI bleed    Headache    Seizures (HCC)    TBI (traumatic brain injury) ([place])     MEDICATIONS: Current Outpatient Medications on File Prior to Visit  Medication Sig Dispense Refill   atorvastatin (LIPITOR) 20 MG tablet Take 20 mg by mouth daily.     carbamazepine (CARBATROL) 300 MG 12 hr capsule TAKE ONE CAPSULE IN THE MORNING AND TWO CAPSULES IN THE EVENING. 180 capsule 0   Cholecalciferol (VITAMIN D3) 100000  UNIT/GM POWD one capsule PO daily     Docusate Calcium (STOOL SOFTENER PO) Take 2 tablets by mouth at bedtime.     escitalopram (LEXAPRO) 20 MG tablet      ferrous sulfate 325 (65 FE) MG tablet Take 1 tablet (325 mg total) by mouth daily with breakfast. 30 tablet 3   lamoTRIgine (LAMICTAL) 200 MG tablet Take 1 tablet (200 mg total) by mouth 2 (two) times daily. 180 tablet 1   lamoTRIgine (LAMICTAL) 25 MG tablet TAKE TWO TABLETS BY MOUTH TWICE DAILY ALONG WITH 200MG  TABLET. 240 tablet 0   Multiple Vitamin (MULTIVITAMIN WITH MINERALS) TABS Take 1 tablet by mouth daily.     Multiple Vitamin (MULTIVITAMIN) tablet Take 1 tablet by mouth daily.     pantoprazole (PROTONIX) 40 MG  tablet TAKE 1 TABLET TWICE DAILY. (Patient taking differently: Take 40 mg by mouth 2 (two) times daily.) 60 tablet 0   tamsulosin (FLOMAX) 0.4 MG CAPS capsule Take 1 capsule (0.4 mg total) by mouth daily after breakfast. 30 capsule 0   zinc sulfate 220 (50 Zn) MG capsule Take 220 mg by mouth daily.     No current facility-administered medications on file prior to visit.    ALLERGIES: Allergies  Allergen Reactions   Nuedexta [Dextromethorphan-Quinidine] Anaphylaxis    Lips began to swell   Nsaids Other (See Comments)    Due to GI bleeding    FAMILY HISTORY: Family History  Problem Relation Age of Onset   Hypertension Mother    Heart disease Father        MVR   Stroke Maternal Aunt    Colitis Neg Hx    Esophageal cancer Neg Hx    Rectal cancer Neg Hx    Stomach cancer Neg Hx       Objective:  *** General: No acute distress.  Patient appears well-groomed.   Head:  Normocephalic/atraumatic Eyes:  Fundi examined but not visualized Heart:  Regular rate and rhythm Neurological Exam: ***   Metta Clines, DO  CC: Zachary Infante, MD

## 2023-03-10 ENCOUNTER — Ambulatory Visit (INDEPENDENT_AMBULATORY_CARE_PROVIDER_SITE_OTHER): Payer: Medicare Other | Admitting: Neurology

## 2023-03-10 ENCOUNTER — Other Ambulatory Visit (INDEPENDENT_AMBULATORY_CARE_PROVIDER_SITE_OTHER): Payer: Medicare Other

## 2023-03-10 ENCOUNTER — Encounter: Payer: Self-pay | Admitting: Neurology

## 2023-03-10 VITALS — BP 123/79 | HR 89 | Ht 71.0 in | Wt 185.0 lb

## 2023-03-10 DIAGNOSIS — H9191 Unspecified hearing loss, right ear: Secondary | ICD-10-CM

## 2023-03-10 DIAGNOSIS — Z79899 Other long term (current) drug therapy: Secondary | ICD-10-CM | POA: Diagnosis not present

## 2023-03-10 DIAGNOSIS — R270 Ataxia, unspecified: Secondary | ICD-10-CM

## 2023-03-10 DIAGNOSIS — S069X9S Unspecified intracranial injury with loss of consciousness of unspecified duration, sequela: Secondary | ICD-10-CM | POA: Diagnosis not present

## 2023-03-10 DIAGNOSIS — G40109 Localization-related (focal) (partial) symptomatic epilepsy and epileptic syndromes with simple partial seizures, not intractable, without status epilepticus: Secondary | ICD-10-CM | POA: Diagnosis not present

## 2023-03-10 DIAGNOSIS — S069X5D Unspecified intracranial injury with loss of consciousness greater than 24 hours with return to pre-existing conscious level, subsequent encounter: Secondary | ICD-10-CM | POA: Diagnosis not present

## 2023-03-10 DIAGNOSIS — F02818 Dementia in other diseases classified elsewhere, unspecified severity, with other behavioral disturbance: Secondary | ICD-10-CM

## 2023-03-10 NOTE — Patient Instructions (Addendum)
Check carbamazepine and lamotrigine levels Check MRI of brain and internal auditory canals with and without contrast No change in carbamazepine and lamotrigine at this time Follow up 3 months.

## 2023-03-12 ENCOUNTER — Telehealth: Payer: Self-pay

## 2023-03-12 DIAGNOSIS — Z79899 Other long term (current) drug therapy: Secondary | ICD-10-CM

## 2023-03-12 NOTE — Telephone Encounter (Signed)
-----   Message from Pieter Partridge, DO sent at 03/11/2023  9:46 AM EDT ----- Carbamazepine level is within normal range.  The incorrect lab was ordered.  Lamotrigine level should have been checked, not levetiracetam

## 2023-03-13 ENCOUNTER — Other Ambulatory Visit (INDEPENDENT_AMBULATORY_CARE_PROVIDER_SITE_OTHER): Payer: Medicare Other

## 2023-03-13 ENCOUNTER — Telehealth: Payer: Self-pay

## 2023-03-13 DIAGNOSIS — Z79899 Other long term (current) drug therapy: Secondary | ICD-10-CM

## 2023-03-13 NOTE — Telephone Encounter (Signed)
[name] came by the office, dropped off medical records from [place]. Family is requesting a referral to a neurosurgeon over at [facility] if possible.

## 2023-03-14 LAB — CARBAMAZEPINE LEVEL, TOTAL: Carbamazepine Lvl: 8.4 mg/L (ref 4.0–12.0)

## 2023-03-14 LAB — LEVETIRACETAM LEVEL: Keppra (Levetiracetam): <2 ug/mL — ABNORMAL LOW

## 2023-03-14 NOTE — Telephone Encounter (Signed)
LMOVM please give the office a call back.

## 2023-03-14 NOTE — Telephone Encounter (Signed)
Per Patient caregiver, Patient mother would like to have the patient Neurosurgeon referral to be sent to [facility].  Per Caregiver patient's PCP advised of the referral and was suppose to add referral but she did not know if it would go to [facility]. So she wanted to see If Dr.jaffe would add one for him.  Patient had some confusion,Hearing loss and Unsteady Gait. Patient has not had a follow up since his the surgery of Shunt added.

## 2023-03-15 LAB — LAMOTRIGINE LEVEL: Lamotrigine Lvl: 6.9 ug/mL (ref 2.5–15.0)

## 2023-03-18 ENCOUNTER — Ambulatory Visit: Payer: Medicaid Other | Admitting: Neurology

## 2023-03-20 ENCOUNTER — Encounter: Payer: Self-pay | Admitting: Neurology

## 2023-03-20 NOTE — Telephone Encounter (Signed)
Per Mycahrt message recevied, I called the medical records at [facility] asking for the fax number, so that you could request the information needed about Zachary Thornton's shunt(model and serial #). I spoke with Zachary Thornton, he stated that they do not use fax anymore for requests. He said that you have to request through the mailing address. Zachary Thornton's medical record number: [id number]. [facility], Attn: [place], [place] [address]. Again, thank you so much for all your help with this matter.   Zachary Thornton  Letter mailed

## 2023-03-20 NOTE — Telephone Encounter (Signed)
Advised caregiver if the ex wife was the one who requested records they will only talk to her. So I would get in contact with her and ask if she could call just ask fr the shunt information lot number make and model number.   Okay to send information in a mychart message if needed.

## 2023-03-27 DIAGNOSIS — H903 Sensorineural hearing loss, bilateral: Secondary | ICD-10-CM | POA: Diagnosis not present

## 2023-03-27 DIAGNOSIS — H838X3 Other specified diseases of inner ear, bilateral: Secondary | ICD-10-CM | POA: Diagnosis not present

## 2023-04-10 ENCOUNTER — Other Ambulatory Visit: Payer: Self-pay | Admitting: Neurology

## 2023-04-24 ENCOUNTER — Other Ambulatory Visit: Payer: Medicaid Other

## 2023-04-28 ENCOUNTER — Other Ambulatory Visit: Payer: Self-pay

## 2023-04-28 ENCOUNTER — Ambulatory Visit
Admission: RE | Admit: 2023-04-28 | Discharge: 2023-04-28 | Disposition: A | Discharge status: Home / Self Care | Payer: Medicare Other | Source: Ambulatory Visit | Attending: Emergency Medicine | Admitting: Emergency Medicine

## 2023-04-28 ENCOUNTER — Emergency Department
Admission: EM | Admit: 2023-04-28 | Discharge: 2023-04-28 | Disposition: A | Discharge status: Home / Self Care | Payer: Medicare Other | Attending: Emergency Medicine | Admitting: Emergency Medicine

## 2023-04-28 DIAGNOSIS — K5900 Constipation, unspecified: Secondary | ICD-10-CM | POA: Insufficient documentation

## 2023-04-28 DIAGNOSIS — R109 Unspecified abdominal pain: Secondary | ICD-10-CM | POA: Diagnosis present

## 2023-04-28 DIAGNOSIS — R140 Abdominal distension (gaseous): Secondary | ICD-10-CM | POA: Diagnosis not present

## 2023-04-28 DIAGNOSIS — F0390 Unspecified dementia without behavioral disturbance: Secondary | ICD-10-CM | POA: Insufficient documentation

## 2023-04-28 LAB — URINALYSIS, ROUTINE W REFLEX MICROSCOPIC
Bilirubin Urine: NEGATIVE
Glucose, UA: NEGATIVE mg/dL
Hgb urine dipstick: NEGATIVE
Ketones, ur: NEGATIVE mg/dL
Leukocytes,Ua: NEGATIVE
Nitrite: NEGATIVE
Protein, ur: NEGATIVE mg/dL
Specific Gravity, Urine: 1.014 (ref 1.005–1.030)
pH: 7 (ref 5.0–8.0)

## 2023-04-28 LAB — COMPREHENSIVE METABOLIC PANEL
ALT: 20 U/L (ref 0–44)
AST: 26 U/L (ref 15–41)
Albumin: 4.3 g/dL (ref 3.5–5.0)
Alkaline Phosphatase: 90 U/L (ref 38–126)
Anion gap: 8 (ref 5–15)
BUN: 11 mg/dL (ref 6–20)
CO2: 24 mmol/L (ref 22–32)
Calcium: 9.1 mg/dL (ref 8.9–10.3)
Chloride: 99 mmol/L (ref 98–111)
Creatinine, Ser: 0.76 mg/dL (ref 0.61–1.24)
GFR, Estimated: >60 mL/min (ref >60)
Glucose, Bld: 116 mg/dL — ABNORMAL HIGH (ref 70–99)
Potassium: 4 mmol/L (ref 3.5–5.1)
Sodium: 131 mmol/L — ABNORMAL LOW (ref 135–145)
Total Bilirubin: 0.6 mg/dL (ref 0.3–1.2)
Total Protein: 6.9 g/dL (ref 6.5–8.1)

## 2023-04-28 LAB — CBC
HCT: 40.5 % (ref 39.0–52.0)
Hemoglobin: 13.7 g/dL (ref 13.0–17.0)
MCH: 31.7 pg (ref 26.0–34.0)
MCHC: 33.8 g/dL (ref 30.0–36.0)
MCV: 93.8 fL (ref 80.0–100.0)
Platelets: 245 10*3/uL (ref 150–400)
RBC: 4.32 MIL/uL (ref 4.22–5.81)
RDW: 11.5 % (ref 11.5–15.5)
WBC: 5.7 10*3/uL (ref 4.0–10.5)
nRBC: 0 % (ref 0.0–0.2)

## 2023-04-28 LAB — LIPASE, BLOOD: Lipase: 39 U/L (ref 11–51)

## 2023-04-28 NOTE — ED Provider Notes (Signed)
[facility] Provider Note    Event Date/Time   First MD Initiated Contact with Patient 04/28/23 1202     (approximate)   History   Abdominal Pain   HPI  BANDY YACONO is a 57 y.o. male  with pmh TBI whio p/w abdominal distension. Caregiver noticed abdominal distension. Has been eating and drinking normally. Pt does toileting on his own, so caregiver does not know if he is constipated.  Patient denies pain currently.  Currently when she asked him earlier he did say he had abdominal pain.  Has not had any vomiting or fevers that she is aware of.  Patient tells me last BM was this morning and was normal.     Past Medical History:  Diagnosis Date   Back injury    Blood loss anemia    Blood transfusion without reported diagnosis    Dementia with behavioral disturbance (HCC)    Depression    Duodenal ulcer hemorrhage    GI bleed    Headache    Seizures (HCC)    TBI (traumatic brain injury) [facility])     Patient Active Problem List   Diagnosis Date Noted   History of duodenal ulcer with hemorrhage 03/14/2021   Severe sepsis (HCC) 03/14/2021   Pneumonia 03/14/2021   Sepsis (HCC) 03/14/2021   Syncope 03/23/2017   Bleeding duodenal ulcer    Hypokalemia    Lactic acidosis    Duodenal ulcer hemorrhage 10/31/2015   Urinary retention 10/30/2015   Acute blood loss anemia 10/29/2015   Hypotension 10/29/2015   Syncope and collapse 10/29/2015   Traumatic brain injury with depressed frontal skull fracture (HCC) 09/07/2012   Cognitive deficit as late effect of traumatic brain injury (HCC) 09/07/2012   Episodic dyscontrol syndrome 09/07/2012     Physical Exam  Triage Vital Signs: ED Triage Vitals  Enc Vitals Group     BP 04/28/23 0917 105/73     Pulse Rate 04/28/23 0917 (!) 101     Resp 04/28/23 0917 16     Temp 04/28/23 0917 98.6 F (37 C)     Temp Source 04/28/23 0917 Oral     SpO2 04/28/23 0917 94 %     Weight 04/28/23 0915 184 lb 15.5 oz (83.9 kg)      Height 04/28/23 0915 5\' 11"  (1.803 m)     Head Circumference --      Peak Flow --      Pain Score --      Pain Loc --      Pain Edu? --      Excl. in GC? --     Most recent vital signs: Vitals:   04/28/23 0917  BP: 105/73  Pulse: (!) 101  Resp: 16  Temp: 98.6 F (37 C)  SpO2: 94%     General: Awake, no distress. CV:  Good peripheral perfusion.  Resp:  Normal effort.  Abd:  No distention.  Small reducible umbilical hernia, abdomen is mildly distended but soft nontender throughout Neuro:             Awake, Alert, Oriented x 3  Other:     ED Results / Procedures / Treatments  Labs (all labs ordered are listed, but only abnormal results are displayed) Labs Reviewed  COMPREHENSIVE METABOLIC PANEL - Abnormal; Notable for the following components:      Result Value   Sodium 131 (*)    Glucose, Bld 116 (*)    All other components  within normal limits  URINALYSIS, ROUTINE W REFLEX MICROSCOPIC - Abnormal; Notable for the following components:   Color, Urine YELLOW (*)    APPearance HAZY (*)    All other components within normal limits  LIPASE, BLOOD  CBC     EKG     RADIOLOGY    PROCEDURES:  Critical Care performed: No  Procedures  The patient is on the cardiac monitor to evaluate for evidence of arrhythmia and/or significant heart rate changes.   MEDICATIONS ORDERED IN ED: Medications - No data to display   IMPRESSION / MDM / ASSESSMENT AND PLAN / ED COURSE  I reviewed the triage vital signs and the nursing notes.                              Patient's presentation is most consistent with acute complicated illness / injury requiring diagnostic workup.  Differential diagnosis includes, but is not limited to, pancreatitis, biliary colic, GERD, gastritis, bowel obstruction, constipation  57 year old male with TBI who presents because of abdominal distention.  Caregiver was worried about his abdomen looking and feeling more distended since  yesterday.  Patient tells me last BM was this morning and has not had constipation and is denying vomiting or nausea and denies pain currently.  His caregiver says does not typically endorse pain and has been very sick in the past with sepsis without any complaints.  Patient's vital signs are notable for mild tachycardia.  Overall he looks well and is resting comfortably.  He has a reducible umbilical hernia abdomen is somewhat distended but is soft and patient is denying pain when I palpate.  I discussed with the caregiver possibility of conservative management with return if things worsen versus obtaining a CT now given her concern.  She would like to proceed with CT given patient is difficult to assess I think this is reasonable.  Will obtain CT without contrast.  Patient not requiring pain medication at this time.  Patient's caregiver pulled me aside and now does not want to wait for CT.  She is reliable and I think because she will bring him back if things worsen clinically I think this is reasonable.  Discussed giving him an extra stool softener if she is concerned he could be constipated.  Recommended MiraLAX.       FINAL CLINICAL IMPRESSION(S) / ED DIAGNOSES   Final diagnoses:  Abdominal distension     Rx / DC Orders   ED Discharge Orders     None        Note:  This document was prepared using Dragon voice recognition software and may include unintentional dictation errors.   Georga Hacking, MD 04/28/23 [phone]

## 2023-04-28 NOTE — Discharge Instructions (Addendum)
If Nihal develops vomiting complains of abdominal pain or is not able to have a bowel movement then please return to the emergency department.

## 2023-04-28 NOTE — ED Triage Notes (Signed)
C/O abdominal distention and and firm to touch.  Patient has history of TBI per caregiver, so history is difficulty.  Unknown when last BM was.  Patient is AAOx3.  Skin warm and dry. NAD

## 2023-04-28 NOTE — ED Notes (Signed)
Caregiver states patient has history of putting water in urine sample cup.

## 2023-05-05 ENCOUNTER — Telehealth: Payer: Self-pay

## 2023-05-05 ENCOUNTER — Other Ambulatory Visit: Payer: Medicaid Other

## 2023-05-05 NOTE — Telephone Encounter (Signed)
Transition Care Management Follow-up Telephone Call Date of discharge and from where: [facility] How have you been since you were released from the hospital? Good  Any questions or concerns? No  Items Reviewed: Did the pt receive and understand the discharge instructions provided? Yes  Medications obtained and verified? No  Other? No  Any new allergies since your discharge? No  Dietary orders reviewed? No Do you have support at home? Yes   Follow up appointments reviewed:  PCP Hospital f/u appt confirmed? Yes  Scheduled to see PCP on June @ . Specialist Hospital f/u appt confirmed? No  Scheduled to see  on  @ . Are transportation arrangements needed? no  If their condition worsens, is the pt aware to call PCP or go to the Emergency Dept.? Yes Was the patient provided with contact information for the PCP's office or ED? Yes Was to pt encouraged to call back with questions or concerns? Yes

## 2023-05-06 ENCOUNTER — Ambulatory Visit
Admission: RE | Admit: 2023-05-06 | Discharge: 2023-05-06 | Disposition: A | Discharge status: Home / Self Care | Payer: Medicare Other | Source: Ambulatory Visit | Attending: Neurology | Admitting: Neurology

## 2023-05-06 ENCOUNTER — Telehealth: Payer: Self-pay | Admitting: Neurology

## 2023-05-06 DIAGNOSIS — R270 Ataxia, unspecified: Secondary | ICD-10-CM

## 2023-05-06 DIAGNOSIS — H9191 Unspecified hearing loss, right ear: Secondary | ICD-10-CM

## 2023-05-06 DIAGNOSIS — S069X5D Unspecified intracranial injury with loss of consciousness greater than 24 hours with return to pre-existing conscious level, subsequent encounter: Secondary | ICD-10-CM

## 2023-05-06 NOTE — Telephone Encounter (Signed)
Felisha called in stating the pt was supposed to have an MRI today, but they didn't get it done because the technician told them his Shunt would have to be adjusted after the MRI was done. She is wondering if this is something Dr. Everlena Cooper could do?

## 2023-05-12 DIAGNOSIS — Z87820 Personal history of traumatic brain injury: Secondary | ICD-10-CM | POA: Diagnosis not present

## 2023-05-12 DIAGNOSIS — Z982 Presence of cerebrospinal fluid drainage device: Secondary | ICD-10-CM | POA: Diagnosis not present

## 2023-05-12 DIAGNOSIS — Z6827 Body mass index (BMI) 27.0-27.9, adult: Secondary | ICD-10-CM | POA: Diagnosis not present

## 2023-05-12 DIAGNOSIS — R269 Unspecified abnormalities of gait and mobility: Secondary | ICD-10-CM | POA: Diagnosis not present

## 2023-05-15 ENCOUNTER — Ambulatory Visit
Admission: RE | Admit: 2023-05-15 | Discharge: 2023-05-15 | Disposition: A | Discharge status: Home / Self Care | Payer: Medicare Other | Source: Ambulatory Visit | Attending: Neurology | Admitting: Neurology

## 2023-05-15 DIAGNOSIS — S0990XA Unspecified injury of head, initial encounter: Secondary | ICD-10-CM | POA: Diagnosis not present

## 2023-05-15 DIAGNOSIS — G9389 Other specified disorders of brain: Secondary | ICD-10-CM | POA: Diagnosis not present

## 2023-05-15 DIAGNOSIS — S069X5D Unspecified intracranial injury with loss of consciousness greater than 24 hours with return to pre-existing conscious level, subsequent encounter: Secondary | ICD-10-CM

## 2023-05-23 DIAGNOSIS — R269 Unspecified abnormalities of gait and mobility: Secondary | ICD-10-CM | POA: Diagnosis not present

## 2023-05-23 DIAGNOSIS — Z982 Presence of cerebrospinal fluid drainage device: Secondary | ICD-10-CM | POA: Diagnosis not present

## 2023-05-23 DIAGNOSIS — Z87820 Personal history of traumatic brain injury: Secondary | ICD-10-CM | POA: Diagnosis not present

## 2023-05-26 NOTE — Progress Notes (Signed)
LMOVM to call the office back.

## 2023-05-27 ENCOUNTER — Other Ambulatory Visit: Payer: Self-pay | Admitting: Neurology

## 2023-06-02 DIAGNOSIS — Z982 Presence of cerebrospinal fluid drainage device: Secondary | ICD-10-CM | POA: Diagnosis not present

## 2023-06-02 DIAGNOSIS — H9193 Unspecified hearing loss, bilateral: Secondary | ICD-10-CM | POA: Diagnosis not present

## 2023-06-02 DIAGNOSIS — S069X0S Unspecified intracranial injury without loss of consciousness, sequela: Secondary | ICD-10-CM | POA: Diagnosis not present

## 2023-06-02 DIAGNOSIS — E785 Hyperlipidemia, unspecified: Secondary | ICD-10-CM | POA: Diagnosis not present

## 2023-06-02 DIAGNOSIS — Z1331 Encounter for screening for depression: Secondary | ICD-10-CM | POA: Diagnosis not present

## 2023-06-02 DIAGNOSIS — J309 Allergic rhinitis, unspecified: Secondary | ICD-10-CM | POA: Diagnosis not present

## 2023-06-02 DIAGNOSIS — R0683 Snoring: Secondary | ICD-10-CM | POA: Diagnosis not present

## 2023-06-02 DIAGNOSIS — Z23 Encounter for immunization: Secondary | ICD-10-CM | POA: Diagnosis not present

## 2023-06-02 DIAGNOSIS — E559 Vitamin D deficiency, unspecified: Secondary | ICD-10-CM | POA: Diagnosis not present

## 2023-06-02 DIAGNOSIS — R569 Unspecified convulsions: Secondary | ICD-10-CM | POA: Diagnosis not present

## 2023-06-02 DIAGNOSIS — Z0000 Encounter for general adult medical examination without abnormal findings: Secondary | ICD-10-CM | POA: Diagnosis not present

## 2023-06-02 DIAGNOSIS — R2689 Other abnormalities of gait and mobility: Secondary | ICD-10-CM | POA: Diagnosis not present

## 2023-06-04 ENCOUNTER — Telehealth: Payer: Self-pay | Admitting: Diagnostic Neuroimaging

## 2023-06-04 NOTE — Telephone Encounter (Signed)
Received sleep referral from Dr. Waynard Edwards at [facility]. Placed in sleep referrals box

## 2023-06-08 ENCOUNTER — Other Ambulatory Visit: Payer: Self-pay | Admitting: Neurology

## 2023-06-11 ENCOUNTER — Ambulatory Visit: Payer: Medicaid Other | Admitting: Neurology

## 2023-08-05 ENCOUNTER — Other Ambulatory Visit: Payer: Self-pay | Admitting: Neurology

## 2023-09-23 DIAGNOSIS — S01302A Unspecified open wound of left ear, initial encounter: Secondary | ICD-10-CM | POA: Diagnosis not present

## 2023-09-23 DIAGNOSIS — H9193 Unspecified hearing loss, bilateral: Secondary | ICD-10-CM | POA: Diagnosis not present

## 2023-09-24 ENCOUNTER — Ambulatory Visit (INDEPENDENT_AMBULATORY_CARE_PROVIDER_SITE_OTHER): Payer: Medicare Other | Admitting: Otolaryngology

## 2023-09-24 ENCOUNTER — Encounter (INDEPENDENT_AMBULATORY_CARE_PROVIDER_SITE_OTHER): Payer: Self-pay | Admitting: Otolaryngology

## 2023-09-24 VITALS — Ht 68.0 in | Wt 180.0 lb

## 2023-09-24 DIAGNOSIS — H903 Sensorineural hearing loss, bilateral: Secondary | ICD-10-CM | POA: Diagnosis not present

## 2023-09-24 DIAGNOSIS — H60332 Swimmer's ear, left ear: Secondary | ICD-10-CM | POA: Insufficient documentation

## 2023-09-24 DIAGNOSIS — S00432A Contusion of left ear, initial encounter: Secondary | ICD-10-CM | POA: Diagnosis not present

## 2023-09-24 NOTE — Progress Notes (Deleted)
CC: Bleeding from left ear  HPI: The patient is a 57 year old male who returns today with his caregiver.  The patient was previously seen for bilateral sensorineural hearing loss.  He has a history of traumatic brain injury in 2012, secondary to falling down a flight of stairs.  According to the caregiver, the patient started wearing bilateral hearing aids 1 month ago.  During his routine visit with his audiologist yesterday, the patient was noted to have blood in the left ear canal.  The patient denies any otalgia, change in his hearing, or dizziness.     Assessment:

## 2023-09-24 NOTE — Progress Notes (Signed)
Patient ID: Zachary Thornton, male   DOB: 01/13/1966, 57 y.o.   MRN: [id number]  CC: Bleeding from left ear  HPI: The patient is a 57 year old male who returns today with his caregiver.  The patient was previously seen for bilateral sensorineural hearing loss.  He has a history of traumatic brain injury in 2012, secondary to falling down a flight of stairs.  According to the caregiver, the patient started wearing bilateral hearing aids 1 month ago.  During his routine visit with his audiologist yesterday, the patient was noted to have blood in the left ear canal.  The patient denies any otalgia, change in his hearing, or dizziness.  Exam: General: Communicates with some difficulty, well nourished, no acute distress. Head: Normocephalic, no evidence injury, no tenderness, facial buttresses intact without stepoff. Face/sinus: No tenderness to palpation and percussion. Facial movement is normal and symmetric. Eyes: PERRL, EOMI. No scleral icterus, conjunctivae clear. No nystagmus at any point of gaze. Ears: Auricles well formed without lesions.  A small amount of blood clots and cerumen are noted within the left ear canal.  Contusion is noted within the left ear canal.  Under the operating microscope, the left ear canal is debrided.  The tympanic membrane is intact and mobile.  The right ear is normal.  Nose: External evaluation reveals normal support and skin without lesions.  Dorsum is intact.  Anterior rhinoscopy reveals pink mucosa over anterior aspect of inferior turbinates and intact septum.  No purulence noted. Oral:  Oral cavity and oropharynx are intact, symmetric, without erythema or edema.  Mucosa is moist without lesions. Neck: Full range of motion without pain.  There is no significant lymphadenopathy.  No masses palpable.  Thyroid bed within normal limits to palpation.  Parotid glands and submandibular glands equal bilaterally without mass.  Trachea is midline.   Assessment: 1.  Recent left ear  trauma, resulting in left ear canal contusion.  A small amount of blood clots/cerumen are noted in the left ear canal. 2.  Both tympanic membranes and middle ear spaces are noted to be normal. 3.  Bilateral sensorineural hearing loss.  Plan: 1.  Otomicroscopy with left ear debridement. 2.  The patient is reassured that no acute infection is noted today. 3.  The patient is instructed to be careful when inserting his hearing aid into his ear canals. 4.  The patient is encouraged to call with any questions or concerns.

## 2023-09-25 ENCOUNTER — Telehealth: Payer: Self-pay | Admitting: Neurology

## 2023-09-25 NOTE — Telephone Encounter (Signed)
[name] caregiver called in and left a message with the access nurse on 09/24/23. Caller states patient needs a refill on lamictal but has enough to last 3-4 days. Did not specify pharmacy.

## 2023-09-26 ENCOUNTER — Other Ambulatory Visit: Payer: Self-pay | Admitting: Neurology

## 2023-09-26 MED ORDER — LAMOTRIGINE 25 MG PO TABS: ORAL_TABLET | ORAL | 3 refills | Status: DC

## 2023-09-26 MED ORDER — LAMOTRIGINE 200 MG PO TABS: 200.0000 mg | ORAL_TABLET | Freq: Two times a day (BID) | ORAL | 3 refills | Status: DC

## 2023-09-26 NOTE — Telephone Encounter (Signed)
Patient Caregiver advised of Dr.Jaffe note, Both the 25mg  tablet prescription and 200mg  tablet prescription have been sent to [facility] (90 day supplies)

## 2023-10-07 ENCOUNTER — Other Ambulatory Visit: Payer: Self-pay | Admitting: Neurology

## 2023-10-08 ENCOUNTER — Encounter: Payer: Self-pay | Admitting: *Deleted

## 2023-10-09 ENCOUNTER — Encounter: Payer: Self-pay | Admitting: Neurology

## 2023-10-09 ENCOUNTER — Ambulatory Visit: Payer: Medicare Other | Admitting: Neurology

## 2023-10-09 VITALS — BP 126/76 | HR 92 | Ht 68.0 in | Wt 182.0 lb

## 2023-10-09 DIAGNOSIS — Z9189 Other specified personal risk factors, not elsewhere classified: Secondary | ICD-10-CM | POA: Diagnosis not present

## 2023-10-09 DIAGNOSIS — G4719 Other hypersomnia: Secondary | ICD-10-CM

## 2023-10-09 DIAGNOSIS — Z87820 Personal history of traumatic brain injury: Secondary | ICD-10-CM

## 2023-10-09 DIAGNOSIS — E663 Overweight: Secondary | ICD-10-CM | POA: Diagnosis not present

## 2023-10-09 DIAGNOSIS — R0683 Snoring: Secondary | ICD-10-CM | POA: Diagnosis not present

## 2023-10-09 DIAGNOSIS — G40909 Epilepsy, unspecified, not intractable, without status epilepticus: Secondary | ICD-10-CM

## 2023-10-09 NOTE — Patient Instructions (Signed)

## 2023-10-09 NOTE — Progress Notes (Signed)
Subjective:    Patient ID: Zachary Thornton is a 57 y.o. male.  HPI    Huston Foley, MD, PhD [facility] [address] [address] [place], [place] [address]  Dear Dr. Waynard Edwards,  I saw your patient, Zachary Thornton, upon your kind request in my sleep clinic today for initial consultation of his sleep disorder, in particular, concern for underlying obstructive sleep apnea.  The patient is accompanied by his full-time caretaker, Sunny Schlein, today.  As you know, Zachary Thornton is a 57 year old male with an underlying complex medical history of traumatic brain injury, status post craniotomy, VP shunt placement, seizure disorder, anemia, hyperlipidemia, history of peptic ulcer, angioedema allergic rhinitis, vitamin D deficiency, and mildly overweight state, who reports very little of his history, history is almost exclusively provided by his caretaker who reports that he snores loudly and is sleepy during the day.  His Epworth sleepiness score is 18 out of 24, fatigue severity score is 61 out of 63.  She has not witnessed any apneas, she feels that he is restless at night as she hears them sometimes.  She does hear him snore outside of his room.  He goes to his bedroom typically between 9 and 9:30 PM and rise time is 7:20 AM on a typical morning.  He does not have nightly nocturia and he denies any recurrent headaches.  He does not typically complain much peripheral lesion.  He does not have a family history of sleep apnea as far as she knows.  He drinks quite a bit of caffeine in the form of sweet tea, up to 6 or 7 cups/day.  He does not drink any coffee and only occasional soda.  He is a non-smoker and does not drink alcohol.  She is a full-time live-in caregiver for him.  He does not typically need assistance at night, if he has to go to the bathroom, he can mobilize himself.  He walks with a cane and they are looking into getting him a walker for gait stability.   He follows with Dr.  Everlena Cooper at [facility] neurology.  I reviewed your office note from 06/02/2023.  His Past Medical History Is Significant For: Past Medical History:  Diagnosis Date   Angioedema    from Nuedexta, keeps epi pen around   Back injury    Blood loss anemia    Blood transfusion without reported diagnosis    Dementia with behavioral disturbance (HCC)    Depression    Duodenal ulcer hemorrhage    GI bleed    Headache    Hyperlipidemia    10/16   Seizures (HCC)    TBI (traumatic brain injury) (HCC)    Urine retention    foley after 10/16 admit   Vitamin D insufficiency 09/2015   level 29    His Past Surgical History Is Significant For: Past Surgical History:  Procedure Laterality Date   BRAIN SURGERY     CRANIOTOMY     Post TBI craniotomy and plate insertion   CSF SHUNT     ESOPHAGOGASTRODUODENOSCOPY N/A 11/01/2015   Procedure: ESOPHAGOGASTRODUODENOSCOPY (EGD);  Surgeon: Ruffin Frederick, MD;  Location: [facility];  Service: Gastroenterology;  Laterality: N/A;   ESOPHAGOGASTRODUODENOSCOPY (EGD) WITH PROPOFOL N/A 10/30/2015   Procedure: ESOPHAGOGASTRODUODENOSCOPY (EGD) WITH PROPOFOL;  Surgeon: Rachael Fee, MD;  Location: [facility];  Service: Endoscopy;  Laterality: N/A;   peg removed  07/2012    His Family History Is Significant For: Family History  Problem Relation Age of Onset   Hypertension Mother    Lung disease Mother    Heart disease Father        MVR   Heart attack Father    Stroke Maternal Aunt    Colitis Neg Hx    Esophageal cancer Neg Hx    Rectal cancer Neg Hx    Stomach cancer Neg Hx    Sleep apnea Neg Hx        not that he knows of    His Social History Is Significant For: Social History   Socioeconomic History   Marital status: Married    Spouse name: Not on file   Number of children: 1   Years of education: Not on file   Highest education level: Not on file  Occupational History   Occupation: Disabled  Tobacco Use   Smoking status: Never    Smokeless tobacco: Never  Vaping Use   Vaping status: Never Used  Substance and Sexual Activity   Alcohol use: No    Alcohol/week: 0.0 standard drinks of alcohol   Drug use: No   Sexual activity: Yes  Other Topics Concern   Not on file  Social History Narrative   Mother lives next door. He has a live-in caregiver, Sunny Schlein.  Normally ambulates without assistance. He has a cane and will be getting a walker soon for balance.    Right handed   One story home   Social Determinants of Health   Financial Resource Strain: Not on file  Food Insecurity: Not on file  Transportation Needs: Not on file  Physical Activity: Not on file  Stress: Not on file  Social Connections: Not on file    His Allergies Are:  Allergies  Allergen Reactions   Nuedexta [Dextromethorphan-Quinidine] Anaphylaxis    Lips began to swell   Nsaids Other (See Comments)    Due to GI bleeding   Depakote [Divalproex Sodium]    Nasacort [Triamcinolone]     Too many nose bleeds on this   Trazodone     Urine retention   :   His Current Medications Are:  Outpatient Encounter Medications as of 10/09/2023  Medication Sig   atorvastatin (LIPITOR) 20 MG tablet Take 20 mg by mouth daily.   carbamazepine (CARBATROL) 300 MG 12 hr capsule TAKE ONE CAPSULE IN THE MORNING AND TWO CAPSULES IN THE EVENING.   clotrimazole-betamethasone (LOTRISONE) cream Apply 1 Application topically as needed.   cyanocobalamin (VITAMIN B12) 1000 MCG tablet Take 1,000 mcg by mouth daily.   Docusate Calcium (STOOL SOFTENER PO) Take 2 tablets by mouth at bedtime.   EPIPEN 2-PAK 0.3 MG/0.3ML SOAJ injection Inject 0.3 mg into the muscle as needed.   escitalopram (LEXAPRO) 20 MG tablet 20 mg daily.   ferrous sulfate 325 (65 FE) MG tablet Take 1 tablet (325 mg total) by mouth daily with breakfast.   lamoTRIgine (LAMICTAL) 200 MG tablet Take 1 tablet (200 mg total) by mouth 2 (two) times daily.   lamoTRIgine (LAMICTAL) 25 MG tablet TAKE TWO  TABLETS BY MOUTH TWICE DAILY ALONG WITH 200MG  TABLET.   Multiple Vitamins-Minerals (MULTIVITAMIN MEN 50+ PO) Take by mouth 2 (two) times daily.   pantoprazole (PROTONIX) 40 MG tablet TAKE 1 TABLET TWICE DAILY. (Patient taking differently: Take 40 mg by mouth 2 (two) times daily.)   tamsulosin (FLOMAX) 0.4 MG CAPS capsule Take 1 capsule (0.4 mg total) by mouth daily after breakfast.   VITAMIN D PO Take by mouth.  zinc sulfate 220 (50 Zn) MG capsule Take 220 mg by mouth daily.   [DISCONTINUED] Cholecalciferol (VITAMIN D3) 100000 UNIT/GM POWD one capsule PO daily (Patient not taking: Reported on 10/09/2023)   [DISCONTINUED] Multiple Vitamin (MULTIVITAMIN WITH MINERALS) TABS Take 1 tablet by mouth daily.   [DISCONTINUED] Multiple Vitamin (MULTIVITAMIN) tablet Take 1 tablet by mouth daily.   No facility-administered encounter medications on file as of 10/09/2023.  :   Review of Systems:  Out of a complete 14 point review of systems, all are reviewed and negative with the exception of these symptoms as listed below:          Review of Systems  Neurological:        Patient is here with his live-in caregiver, Sunny Schlein, for a sleep consult. He endorses snoring, poor quality of sleep, history of seizures. Last seizure was at least 2 years ago per caregiver report. He is on Lamotrigine 250 mg BID. He does not have any known apneic episodes while sleeping. He has never had a sleep study before. His mother doesn't have sleep apnea but she has some sort of breathing issue where she has to wear oxygen at night. [name] states his snoring is loud. His mother is unsure if this started after his brain injury, which was in 2012 in [place]. He fell down into a basement of a house that he was remodeling at the time. His caregiver does say that he will answer yes to most questions when asked.    Objective:  Neurological Exam  Physical Exam Physical Examination:   Vitals:   10/09/23 1352  BP:  126/76  Pulse: 92    General Examination: The patient is a very pleasant 57 y.o. male in no acute distress. He appears well-groomed, minimally verbal.  Mostly responding in single word sentences or nodding.    HEENT: Pupils are reactive to light, tracking well-preserved, face is symmetric.  Minimal speech noted, no significant dysarthria.  Airway examination reveals mild mouth dryness, Mallampati class II, tonsillar size of about 1+.  Tongue protrudes centrally and palate elevates symmetrically.  Neck circumference 16 three-quarter inches.  Minimal overbite.  Chest: Clear to auscultation without wheezing, rhonchi or crackles noted.  Heart: S1+S2+0, regular and normal without murmurs, rubs or gallops noted.   Abdomen: Soft, non-tender and non-distended.  Extremities: There is no pitting edema in the distal lower extremities bilaterally.   Skin: Warm and dry without trophic changes noted.   Musculoskeletal: exam reveals no obvious joint deformities.   Neurologically:  Mental status: The patient is awake, pain good attention, unable to provide his history.  Minimally verbal.   Cooperative with exam.   Cranial nerves II - XII are as described above under HEENT exam.  Motor exam: Normal bulk, moves all 4 extremities.  No obvious resting or action tremor.  Fine motor skills globally mildly impaired.  Cerebellar testing: No dysmetria or intention tremor. There is no truncal or gait ataxia.  Sensory exam: intact to light touch in the upper and lower extremities.  Gait, station and balance: He stands without difficulty and does not require assistance.  He walks with a cane.    Assessment and Plan:  In summary, MIACHEL NARDELLI is a very pleasant 57 y.o.-year old male with an underlying complex medical history of traumatic brain injury, status post craniotomy, VP shunt placement, seizure disorder, anemia, hyperlipidemia, history of peptic ulcer, angioedema allergic rhinitis, vitamin D deficiency,  and mildly overweight state, whose history and physical  exam are concerning for sleep disordered breathing,  particularly obstructive sleep apnea (OSA). A laboratory attended sleep study is typically considered "gold standard" for evaluation of sleep disordered breathing.  [name] reports that his mom would like for him to have a laboratory sleep study for more detailed evaluation and mom is afraid that he would not be able to do the home sleep test on his own. I had a long chat with the patient and Sunny Schlein about my findings and the diagnosis of sleep apnea, particularly OSA, its prognosis and treatment options. We talked about medical/conservative treatments, surgical interventions and non-pharmacological approaches for symptom control. I explained, in particular, the risks and ramifications of untreated moderate to severe OSA, especially with respect to developing cardiovascular disease down the road, including congestive heart failure (CHF), difficult to treat hypertension, cardiac arrhythmias (particularly A-fib), neurovascular complications including TIA, stroke and dementia. Even type 2 diabetes has, in part, been linked to untreated OSA. Symptoms of untreated OSA may include (but may not be limited to) daytime sleepiness, nocturia (i.e. frequent nighttime urination), memory problems, mood irritability and suboptimally controlled or worsening mood disorder such as depression and/or anxiety, lack of energy, lack of motivation, physical discomfort, as well as recurrent headaches, especially morning or nocturnal headaches. We talked about the importance of maintaining a healthy lifestyle and striving for healthy weight.  He is advised to cut back on his sweet tea intake.  We talked about the importance of striving for and maintaining good sleep hygiene. I recommended a sleep study at this time. I outlined the differences between a laboratory attended sleep study which is considered more comprehensive and  accurate over the option of a home sleep test (HST); the latter may lead to underestimation of sleep disordered breathing in some instances and does not help with diagnosing upper airway resistance syndrome and is not accurate enough to diagnose primary central sleep apnea typically. I outlined possible surgical and non-surgical treatment options of OSA, including the use of a positive airway pressure (PAP) device (i.e. CPAP, AutoPAP/APAP or BiPAP in certain circumstances), a custom-made dental device (aka oral appliance, which would require a referral to a specialist dentist or orthodontist typically, and is generally speaking not considered for patients with full dentures or edentulous state), upper airway surgical options, such as traditional UPPP (which is not considered a first-line treatment) or the Inspire device (hypoglossal nerve stimulator, which would involve a referral for consultation with an ENT surgeon, after careful selection, following inclusion criteria - also not first-line treatment). I explained the PAP treatment option to the patient in detail, as this is generally considered first-line treatment.  The patient indicated that he would be willing to try PAP therapy, if the need arises. I explained the importance of being compliant with PAP treatment, not only for insurance purposes but primarily to improve patient's symptoms symptoms, and for the patient's long term health benefit, including to reduce His cardiovascular risks longer-term.    We will pick up our discussion about the next steps and treatment options after testing.  We will keep them posted as to the test results by phone call and/or MyChart messaging where possible.  We will plan to follow-up in sleep clinic accordingly as well.  I answered all their questions today and the patient and his caretaker were in agreement.   I encouraged them to call with any interim questions, concerns, problems or updates or email [place] through  MyChart.  Generally speaking, sleep test authorizations may take up to  2 weeks, sometimes less, sometimes longer, the patient is encouraged to get in touch with [place] if they do not hear back from the sleep lab staff directly within the next 2 weeks.  Thank you very much for allowing me to participate in the care of this nice patient. If I can be of any further assistance to you please do not hesitate to call me at [phone].  Sincerely,   Huston Foley, MD, PhD

## 2023-10-14 ENCOUNTER — Other Ambulatory Visit: Payer: Self-pay | Admitting: Neurology

## 2023-10-17 ENCOUNTER — Other Ambulatory Visit: Payer: Self-pay | Admitting: Neurology

## 2023-10-21 ENCOUNTER — Telehealth: Payer: Self-pay | Admitting: Neurology

## 2023-10-21 NOTE — Telephone Encounter (Signed)
10/20/23 [facility] [place] 10/13/23 Medicare/medicaid no Berkley Harvey req EE   [employer]

## 2023-11-25 ENCOUNTER — Ambulatory Visit (INDEPENDENT_AMBULATORY_CARE_PROVIDER_SITE_OTHER): Payer: Medicare Other | Admitting: Neurology

## 2023-11-25 DIAGNOSIS — G4733 Obstructive sleep apnea (adult) (pediatric): Secondary | ICD-10-CM | POA: Diagnosis not present

## 2023-11-25 DIAGNOSIS — Z87820 Personal history of traumatic brain injury: Secondary | ICD-10-CM

## 2023-11-25 DIAGNOSIS — G40909 Epilepsy, unspecified, not intractable, without status epilepticus: Secondary | ICD-10-CM

## 2023-11-25 DIAGNOSIS — R0683 Snoring: Secondary | ICD-10-CM

## 2023-11-25 DIAGNOSIS — G4720 Circadian rhythm sleep disorder, unspecified type: Secondary | ICD-10-CM

## 2023-11-25 DIAGNOSIS — Z9189 Other specified personal risk factors, not elsewhere classified: Secondary | ICD-10-CM

## 2023-11-25 DIAGNOSIS — G478 Other sleep disorders: Secondary | ICD-10-CM

## 2023-11-25 DIAGNOSIS — G4719 Other hypersomnia: Secondary | ICD-10-CM

## 2023-11-25 DIAGNOSIS — E663 Overweight: Secondary | ICD-10-CM

## 2023-11-26 NOTE — Progress Notes (Unsigned)
NEUROLOGY FOLLOW UP OFFICE NOTE  Zachary Thornton [id number]  Assessment/Plan:   1  Symptomatic focal onset seizures with impaired consciousness as late effect of traumatic brain injury with breakthrough seizures x2.  No provoking factor known. 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   Subjective:  Zachary Thornton is a 57 year old man with traumatic brain injury and history of GI bleed who follows up for seizure disorder.  He is accompanied by his family friend/caregiver who supplement history.    UPDATE: Current medications: Lamotrigine 250 mg twice daily, carbamazepine XR 300 mg in a.m. and 600 mg in p.m (600mg  twice daily caused hyponatremia).   Last seizure:  03/31/2022 Due to worsening balance an MRI of the brain and IAC with and without contrast was ordered but would not perform due to not knowing his VP shunt information.  He instead had a CT of the head on 05/15/2023, which was personally reviewed and revealed normal shunted ventricular size and stable extensive encephalomalacia in the bilateral frontotemporal convexities and bilateral cerebellar hemispheres but no acute findings.  He saw neurosurgery who verified shunt was stable.  He had an MRI that was reportedly okay (report/imaging not available).  Labs from March demonstrated carbamazepine level of 8.4 and lamotrigine level of 6.9.  He did see ENT who noted contusion and blood clots/cerumen in the left ear canal which was debrided.  He did have sensorineural hearing loss and now has hearing aids.  He now uses a walker at the day center as they were concerned about his balance.  Otherwise, uses a cane.  No falls.     He has excessive daytime sleepiness.  Evaluated for sleep apnea.  Results of sleep study still pending.    04/28/2023  LABS:  CBC with WBC 5.7, HGB 13.7, HCT 40.5, PLT 245; CMP with Na 131, K 4, Cl 99, CO2 24, glucose 116, Ca 9.1, BUN 11, Cr 0.76,  t bili 0.6, ALP 90, AST 26, ALT 10   HISTORY: He had a traumatic brain injury in 2012 after falling and hitting the back of his head on concrete.  He fractured his skull and sustained intracranial bleeding, requiring right craniectomy and VP shunt. [name]  He was in a coma for 6 weeks.  After 2 month hospitalization, he was discharged to inpatient rehab facility where he had to relearn how to walk, talk and feed himself.  His wife was unable to care for him and he subsequently moved in with his mother, who is his guardian.  His wife and son live in [place].   He was hospitalized in December for GI bleed due to duodenal ulcer, requiring multiple blood transfusions.   He presented to the ED on 04/29/16 following an unwitnessed fall in which he hit his head.  He did not recall the event.  His mother heard him fall in the bathroom and found him on the floor with eyes rolled back, unresponsive with flexed posturing and shaking of both upper extremities, lasting about 2 minutes.  He had bowel and bladder incontinence.  He was slightly confused afterwards for about 10 minutes.  He did not exhibit foaming at the mouth or tongue biting.  CT of head was personally reviewed and showed chronic changes of traumatic brain injury with bifrontal and temporal encephalomalacia,  as well as post surgical changes of right craniectomy with cranioplasty and left frontal approach VP shunt.  No evidence of hydrocephalus seen.  He was started on Keppra.  He has not had any recurrent spells.  He was found to have worsening anemia and received further transfusions by his PCP.  He does not drive.     He was admitted to [facility] from 03/23/17 to 03/24/17 for syncope.  He had a brief syncopal episode at church.  His face became pale.  He did not exhibit seizure-like activity.  It was brief.  He  was found to be hypotensive by EMS with a BP of 60/30 and was given IV fluids, which improved blood pressure.  He was not orthostatic in the hospital.  Work up in the hospital included head CT with no acute findings, EEG with right hemispheric slowing (which could be due to underlying physiologic abnormality from encephalomalacia and surgery), but no epileptiform discharges, and unremarkable EKG and troponins.  My suspicion is that this was not a seizure.   He had a seizure on 12/10/17, described as staring spell of unresponsiveness followed by generalized tonic-clonic activity with urinary incontinence.  ED note mentions he was not taking Keppra, but his caregiver disputes this.  Carbamazepine level was 12.6.  Na was 131, K 4 and glucose 118.  UA was negative for infection.  He was loaded with Keppra and discharged on Keppra 1000mg  twice daily.  Since then, he has had increased irritability and verbally abusive, so Keppra was discontinued.   In 2022, he has had increased falls and change in behavior.  CT head performed on 07/23/2021 following a fall was personally reviewed and revealed no acute abnormalities.  He needs more supervision.  He cannot blow dry his hair as he had the blow dryer to close to his scalp causing his hair to burn.  He also can no longer clean his ears himself as he once pushed the cerumen far into his canal with the Q-tip, requiring him to see his PCP to have it disimpacted. Once his toilet clogged. Instead of telling somebody, he took the used toilet paper from the toilet and threw them out of the window.  No agitation.  He has some sores on his scalp, presumably due to repeated scratching of his head.    On 03/31/2022, he was sitting at the table when he suddenly stiffened up and became unresponsive.  He exhibited shaking activity for a couple of minutes.  No tongue biting, incontinence or postictal confusion.   Lamotrigine level was 6.0.  Carbamazepine level was 8.2.  CT head on  04/08/2022 personally reviewed revealed extensive posttraumatic encephalomalacia but o acute findings.  Lamotrigine was increased to 250mg  BID.  No subsequent seizures.  Mood is improved as well.  He went to PT for balance which has not improved.  He would squint his left eye when he reads.  He saw ophthalmology and had a prism added to glasses but he still squints when he reads.     Past medication:  Keppra (irritability)   He has no history of seizures prior to 2017.  PAST MEDICAL HISTORY: Past Medical History:  Diagnosis Date   Angioedema    from Nuedexta, keeps epi pen around   Back injury    Blood loss anemia    Blood transfusion without reported diagnosis    Dementia with behavioral disturbance (HCC)    Depression    Duodenal ulcer hemorrhage  GI bleed    Headache    Hyperlipidemia    10/16   Seizures (HCC)    TBI (traumatic brain injury) (HCC)    Urine retention    foley after 10/16 admit   Vitamin D insufficiency 09/2015   level 29    MEDICATIONS: Current Outpatient Medications on File Prior to Visit  Medication Sig Dispense Refill   atorvastatin (LIPITOR) 20 MG tablet Take 20 mg by mouth daily.     carbamazepine (CARBATROL) 300 MG 12 hr capsule TAKE ONE CAPSULE IN THE MORNING AND TWO CAPSULES IN THE EVENING. 180 capsule 0   clotrimazole-betamethasone (LOTRISONE) cream Apply 1 Application topically as needed.     cyanocobalamin (VITAMIN B12) 1000 MCG tablet Take 1,000 mcg by mouth daily.     Docusate Calcium (STOOL SOFTENER PO) Take 2 tablets by mouth at bedtime.     EPIPEN 2-PAK 0.3 MG/0.3ML SOAJ injection Inject 0.3 mg into the muscle as needed.     escitalopram (LEXAPRO) 20 MG tablet 20 mg daily.     ferrous sulfate 325 (65 FE) MG tablet Take 1 tablet (325 mg total) by mouth daily with breakfast. 30 tablet 3   lamoTRIgine (LAMICTAL) 200 MG tablet Take 1 tablet (200 mg total) by mouth 2 (two) times daily. 180 tablet 3   lamoTRIgine (LAMICTAL) 25 MG tablet TAKE TWO  TABLETS BY MOUTH TWICE DAILY ALONG WITH 200MG  TABLET. 360 tablet 3   Multiple Vitamins-Minerals (MULTIVITAMIN MEN 50+ PO) Take by mouth 2 (two) times daily.     pantoprazole (PROTONIX) 40 MG tablet TAKE 1 TABLET TWICE DAILY. (Patient taking differently: Take 40 mg by mouth 2 (two) times daily.) 60 tablet 0   tamsulosin (FLOMAX) 0.4 MG CAPS capsule Take 1 capsule (0.4 mg total) by mouth daily after breakfast. 30 capsule 0   VITAMIN D PO Take by mouth.     zinc sulfate 220 (50 Zn) MG capsule Take 220 mg by mouth daily.     No current facility-administered medications on file prior to visit.    ALLERGIES: Allergies  Allergen Reactions   Nuedexta [Dextromethorphan-Quinidine] Anaphylaxis    Lips began to swell   Nsaids Other (See Comments)    Due to GI bleeding   Depakote [Divalproex Sodium]    Nasacort [Triamcinolone]     Too many nose bleeds on this   Trazodone     Urine retention     FAMILY HISTORY: Family History  Problem Relation Age of Onset   Hypertension Mother    Lung disease Mother    Heart disease Father        MVR   Heart attack Father    Stroke Maternal Aunt    Colitis Neg Hx    Esophageal cancer Neg Hx    Rectal cancer Neg Hx    Stomach cancer Neg Hx    Sleep apnea Neg Hx        not that he knows of      Objective:  Blood pressure 127/85, pulse 99, height 5\' 9"  (1.753 m), weight 184 lb (83.5 kg), SpO2 93%. General: No acute distress.  Patient appears well-groomed.   Head:  Normocephalic/atraumatic Eyes:  Fundi examined but not visualized Heart:  Regular rate and rhythm Neurological Exam: Alert and oriented.  Speech fluent and not dysarhtric.  Language intact.  End-gaze horizontal nystagmus with difficulty tracking.  Otherwise, CN II-XII intact.  Bulk and tone normal.  Muscle strength 5/5 throughout.  Sensation to light touch intact.  Deep tendon reflexes 2+ throughout.  Finger to nose testing with mild dysmetria bilaterally.  Broad-based spastic gait.   Romberg with sway.     Shon Millet, DO  CC: [name], MD

## 2023-11-27 ENCOUNTER — Ambulatory Visit: Payer: Medicare Other | Admitting: Neurology

## 2023-11-27 ENCOUNTER — Encounter: Payer: Self-pay | Admitting: Neurology

## 2023-11-27 VITALS — BP 127/85 | HR 99 | Ht 69.0 in | Wt 184.0 lb

## 2023-11-27 DIAGNOSIS — G40109 Localization-related (focal) (partial) symptomatic epilepsy and epileptic syndromes with simple partial seizures, not intractable, without status epilepticus: Secondary | ICD-10-CM

## 2023-11-27 DIAGNOSIS — S069X5D Unspecified intracranial injury with loss of consciousness greater than 24 hours with return to pre-existing conscious level, subsequent encounter: Secondary | ICD-10-CM

## 2023-11-27 MED ORDER — CARBAMAZEPINE ER 300 MG PO CP12: ORAL_CAPSULE | ORAL | 3 refills | Status: DC

## 2023-12-02 NOTE — Procedures (Signed)
Physician Interpretation:     [facility] POLYSOMNOGRAPHY  INTERPRETATION REPORT   STUDY DATE:  11/25/2023     PATIENT NAME:  Zachary Thornton         DATE OF BIRTH:  July 08, 1966  PATIENT ID:  [id number]    TYPE OF STUDY:    READING PHYSICIAN: Huston Foley, MD, PhD   SCORING TECHNICIAN: Domingo Cocking, RPSGT   Referred by: [name], MD  ? History and Indication for Testing: 57 year old male with an underlying complex medical history of traumatic brain injury, status post craniotomy, VP shunt placement, seizure disorder, anemia, hyperlipidemia, history of peptic ulcer, angioedema allergic rhinitis, vitamin D deficiency, and mildly overweight state, who is reported by his caretaker to have snoring and daytime sleepiness. His Epworth sleepiness score is 18 out of 24, fatigue severity score is 61 out of 63.  Height: 68 in Weight: 182 lb (BMI 27) Neck Size: 17 in    MEDICATIONS: Lipitor, Carbatrol, Lotrisone, Vitamin B12, stool softener, EpiPen, Lexapro, Iron, Lamictal, Multivitamin men 50+, Protonix, Flomax, Vitamin D, Zinc sulfate   TECHNICAL DESCRIPTION: A registered sleep technologist was in attendance for the duration of the recording.  Data collection, scoring, video monitoring, and reporting were performed in compliance with the AASM Manual for the Scoring of Sleep and Associated Events; (Hypopnea is scored based on the criteria listed in Section VIII D. 1b in the AASM Manual V2.6 using a 4% oxygen desaturation rule or Hypopnea is scored based on the criteria listed in Section VIII D. 1a in the AASM Manual V2.6 using 3% oxygen desaturation and /or arousal rule).   SLEEP CONTINUITY AND SLEEP ARCHITECTURE:  Lights-out was at 21:54: and lights-on at  05:00:, with a total recording time of 7 hours, 6 min. Total sleep time ( TST) was 131.5 minutes with a markedly decreased sleep efficiency at 30.9%. There was no REM sleep.  BODY POSITION:  TST was divided  between  the following sleep positions: 100.0% supine;  0.0% lateral;  0% prone. Duration of total sleep and percent of total sleep in their respective position is as follows: supine 131 minutes (100%), non-supine 0 minutes (0%); right 00 minutes (0%), left 00 minutes (0%), and prone 00 minutes (0%).  Total supine REM sleep time was 00 minutes (0% of total REM sleep).  Sleep latency was increased at 50.5 minutes.  REM sleep was absent. Of the total sleep time, the percentage of stage N1 sleep was 12.2%, which is increased, stage N2 sleep was 85%, which is markedly increased, stage N3 sleep was 3.0%, and REM sleep was absent.  Wake after sleep onset (WASO) time accounted for 244 minutes, with moderate to severe sleep fragmentation noted.   RESPIRATORY MONITORING:   Based on CMS criteria (using a 4% oxygen desaturation rule for scoring hypopneas), there were 139 apneas (137 obstructive; 0 central; 2 mixed), and 8 hypopneas.  Apnea index was 63.4. Hypopnea index was 3.7. The apnea-hypopnea index was 67.1/hour overall (67.1 supine, 0 non-supine; 0.0 REM, 0.0 supine REM).  There were 0 respiratory effort-related arousals (RERAs).  The RERA index was 0 events/h. Total respiratory disturbance index (RDI) was 67.1 events/h. RDI results showed: supine RDI  67.1 /h; non-supine RDI 0.0 /h; REM RDI 0.0 /h, supine REM RDI 0.0 /h.   Based on AASM criteria (using a 3% oxygen desaturation and /or arousal rule for scoring hypopneas), there were 139 apneas (137 obstructive; 0 central; 2 mixed), and 9 hypopneas. Apnea index was  63.4. Hypopnea index was 4.1. The apnea-hypopnea index was 67.5 overall (67.5 supine, 0 non-supine; 0.0 REM, 0.0 supine REM).  There were 0 respiratory effort-related arousals (RERAs).  The RERA index was 0 events/h. Total respiratory disturbance index (RDI) was 67.5 events/h. RDI results showed: supine RDI  67.5 /h; non-supine RDI 0.0 /h; REM RDI 0.0 /h, supine REM RDI 0.0 /h.    OXIMETRY: Oxyhemoglobin  Saturation Nadir during sleep was at  75% from a mean of 92%.  Of the Total sleep time (TST)   hypoxemia (=<88%) was present for  33.3 minutes, or 25.4% of total sleep time.    LIMB MOVEMENTS: There were 0 periodic limb movements of sleep (0.0/hr), of which 0 (0.0/hr) were associated with an arousal.   AROUSAL: There were 136 arousals in total, for an arousal index of 62 arousals/hour.  Of these, 127 were identified as respiratory-related arousals (58 /h), 0 were PLM-related arousals (0 /h), and 38 were non-specific arousals (17 /h).   EEG: Review of the EEG showed no abnormal electrical discharges and symmetrical bihemispheric findings.    EKG: The EKG revealed normal sinus rhythm (NSR). The average heart rate during sleep was 74 bpm.  AUDIO/VIDEO REVIEW: The audio and video review did not show any abnormal or unusual behaviors, movements, phonations or vocalizations. The patient took 2 restroom breaks. Snoring was noted, in the mild to moderate range.   POST-STUDY QUESTIONNAIRE: Post study, the patient indicated, that sleep was better than usual.   IMPRESSION:   1. Severe Obstructive Sleep Apnea (OSA) 2. Dysfunctions associated with sleep stages or arousal from sleep 3. Poor sleep pattern   RECOMMENDATIONS:   1. This study demonstrates severe obstructive sleep apnea, with a total AHI of 67.1/hour and O2 nadir of 75% (during supine NREM sleep). The patient did not qualify for an emergency split study, due to poor sleep efficiency and poor sleep consolidation. The absence of REM sleep likely underestimates his sleep disordered breathing. Urgent treatment with a positive airway pressure (PAP) device is recommended.  The patient will be advised to proceed with home autoPAP therapy for now.  A laboratory attended, full night PAP-titration study can be considered down the road, to optimize therapy settings, mask fit, monitoring of tolerance and of proper oxygen saturations. Other treatment options  may be limited, and may include (generally speaking) surgical options in selected patients.  2. Please note that untreated obstructive sleep apnea may carry additional perioperative morbidity. Patients with significant obstructive sleep apnea should receive perioperative PAP therapy and the surgeons and particularly the anesthesiologist should be informed of the diagnosis and the severity of the sleep disordered breathing. 3. This study shows significant sleep fragmentation and abnormal sleep stage percentages; these are nonspecific findings and per se do not signify an intrinsic sleep disorder or a cause for the patient's sleep-related symptoms. Causes include (but are not limited to) the first night effect of the sleep study, circadian rhythm disturbances, medication effect or an underlying mood disorder or medical problem.  4. The patient should be cautioned not to drive, work at heights, or operate dangerous or heavy equipment when tired or sleepy. Review and reiteration of good sleep hygiene measures should be pursued with any patient. 5. The patient will be seen in follow-up in the sleep clinic at [facility] for discussion of the test results, symptom and treatment compliance review, further management strategies, etc. The patient and the referring provider will be notified of the test results. I certify that I have  reviewed the entire raw data recording prior to the issuance of this report in accordance with the Standards of Accreditation of the American Academy of Sleep Medicine (AASM).  Huston Foley, MD, PhD Medical Director, [facility]) Diplomat, ABPN (Neurology and Sleep)               Technical Report:   General Information  Name: Coulter, Galler BMI: 27.67 Physician: Huston Foley, MD  ID: [id number] Height: 68.0 in Technician: Domingo Cocking, RPSGT  Sex: Male Weight: 182.0 lb Record: [id number]  Age: 61 [[birth year]] Date: 11/25/2023     Medical & Medication History    Mr. Glazebrook is a 57 year old male with an underlying complex medical history of traumatic brain injury, status post craniotomy, VP shunt placement, seizure disorder, anemia, hyperlipidemia, history of peptic ulcer, angioedema allergic rhinitis, vitamin D deficiency, and mildly overweight state, who reports very little of his history, history is almost exclusively provided by his caretaker who reports that he snores loudly and is sleepy during the day. His Epworth sleepiness score is 18 out of 24, fatigue severity score is 61 out of 63. She has not witnessed any apneas, she feels that he is restless at night as she hears them sometimes. She does hear him snore outside of his room. He goes to his bedroom typically between 9 and 9:30 PM and rise time is 7:20 AM on a typical morning. He does not have nightly nocturia and he denies any recurrent headaches. He does not typically complain much peripheral lesion. He does not have a family history of sleep apnea as far as she knows. He drinks quite a bit of caffeine in the form of sweet tea, up to 6 or 7 cups/day. He does not drink any coffee and only occasional soda. He is a non-smoker and does not drink alcohol. She is a full-time live-in caregiver for him. He does not typically need assistance at night, if he has to go to the bathroom, he can mobilize himself. He walks with a cane and they are looking into getting him a walker for gait stability.  Lipitor, Carbatrol, Lotrisone, Vitamin B12, stool softener, EpiPen, Lexapro, Iron, Lamictal, Multivitamin men 50+, Protonix, Flomax, Vitamin D, Zinc sulfate   Sleep Disorder      Comments   Patient arrived for a diagnostic polysomnogram. Procedure explained and all questions answered. paste setup without complications. Patient slept supine. Fragmented sleep with a reduced sleep efficiency at 31%. Mild to moderate snoring was noted. Respiratory events observed. After 2 hours total sleep time,  AHI = 68 (time was after 0200). Severe desaturations noted. No obvious cardiac arrhythmias noted. Patient has a known cardiac history. Mild PLMS observed. Two restroom visits.    Lights out: 09:54:32 PM Lights on: 05:00:28 AM   Time Total Supine Side Prone Upright  Recording (TRT) 7h 6.25m 7h 6.42m 0h 0.51m 0h 0.13m 0h 0.75m  Sleep (TST) 2h 11.39m 2h 11.17m 0h 0.83m 0h 0.55m 0h 0.66m   Latency N1 N2 N3 REM Onset Per. Slp. Eff.  Actual 0h 0.15m 0h 20.14m 0h 32.46m 0h 0.28m 0h 50.81m 1h 20.33m 30.87%   Stg Dur Wake N1 N2 N3 REM  Total 294.5 16.0 111.5 4.0 0.0  Supine 294.5 16.0 111.5 4.0 0.0  Side 0.0 0.0 0.0 0.0 0.0  Prone 0.0 0.0 0.0 0.0 0.0  Upright 0.0 0.0 0.0 0.0 0.0   Stg % Wake N1 N2 N3 REM  Total 69.1 12.2 84.8 3.0 0.0  Supine  69.1 12.2 84.8 3.0 0.0  Side 0.0 0.0 0.0 0.0 0.0  Prone 0.0 0.0 0.0 0.0 0.0  Upright 0.0 0.0 0.0 0.0 0.0     Apnea Summary Sub Supine Side Prone Upright  Total 139 Total 139 139 0 0 0    REM 0 0 0 0 0    NREM 139 139 0 0 0  Obs 137 REM 0 0 0 0 0    NREM 137 137 0 0 0  Mix 2 REM 0 0 0 0 0    NREM 2 2 0 0 0  Cen 0 REM 0 0 0 0 0    NREM 0 0 0 0 0   Rera Summary Sub Supine Side Prone Upright  Total 0 Total 0 0 0 0 0    REM 0 0 0 0 0    NREM 0 0 0 0 0   Hypopnea Summary Sub Supine Side Prone Upright  Total 9 Total 9 9 0 0 0    REM 0 0 0 0 0    NREM 9 9 0 0 0   4% Hypopnea Summary Sub Supine Side Prone Upright  Total (4%) 8 Total 8 8 0 0 0    REM 0 0 0 0 0    NREM 8 8 0 0 0     AHI Total Obs Mix Cen  67.53 Apnea 63.42 62.51 0.91 0.00   Hypopnea 4.11 -- -- --  67.07 Hypopnea (4%) 3.65 -- -- --    Total Supine Side Prone Upright  Position AHI 67.53 67.53 0.00 0.00 0.00  REM AHI 0.00   NREM AHI 67.53   Position RDI 67.53 67.53 0.00 0.00 0.00  REM RDI 0.00   NREM RDI 67.53    4% Hypopnea Total Supine Side Prone Upright  Position AHI (4%) 67.07 67.07 0.00 0.00 0.00  REM AHI (4%) 0.00   NREM AHI (4%) 67.07   Position RDI (4%) 67.07 67.07 0.00 0.00  0.00  REM RDI (4%) 0.00   NREM RDI (4%) 67.07    Desaturation Information Threshold: 2% <100% <90% <80% <70% <60% <50% <40%  Supine 199.0 108.0 19.0 0.0 0.0 0.0 0.0  Side 0.0 0.0 0.0 0.0 0.0 0.0 0.0  Prone 0.0 0.0 0.0 0.0 0.0 0.0 0.0  Upright 0.0 0.0 0.0 0.0 0.0 0.0 0.0  Total 199.0 108.0 19.0 0.0 0.0 0.0 0.0  Index 37.6 20.4 3.6 0.0 0.0 0.0 0.0   Threshold: 3% <100% <90% <80% <70% <60% <50% <40%  Supine 152.0 108.0 19.0 0.0 0.0 0.0 0.0  Side 0.0 0.0 0.0 0.0 0.0 0.0 0.0  Prone 0.0 0.0 0.0 0.0 0.0 0.0 0.0  Upright 0.0 0.0 0.0 0.0 0.0 0.0 0.0  Total 152.0 108.0 19.0 0.0 0.0 0.0 0.0  Index 28.7 20.4 3.6 0.0 0.0 0.0 0.0   Threshold: 4% <100% <90% <80% <70% <60% <50% <40%  Supine 136.0 108.0 19.0 0.0 0.0 0.0 0.0  Side 0.0 0.0 0.0 0.0 0.0 0.0 0.0  Prone 0.0 0.0 0.0 0.0 0.0 0.0 0.0  Upright 0.0 0.0 0.0 0.0 0.0 0.0 0.0  Total 136.0 108.0 19.0 0.0 0.0 0.0 0.0  Index 25.7 20.4 3.6 0.0 0.0 0.0 0.0   Threshold: 3% <100% <90% <80% <70% <60% <50% <40%  Supine 152 108 19 0 0 0 0  Side 0 0 0 0 0 0 0  Prone 0 0 0 0 0 0 0  Upright 0 0 0 0 0 0 0  Total 152 108 19 0 0 0 0   Awakening/Arousal Information # of Awakenings 40  Wake after sleep onset 244.29m  Wake after persistent sleep 222.69m   Arousal Assoc. Arousals Index  Apneas 121 55.2  Hypopneas 6 2.7  Leg Movements 0 0.0  Snore 0 0.0  PTT Arousals 0 0.0  Spontaneous 38 17.3  Total 165 75.3  Leg Movement Information PLMS LMs Index  Total LMs during PLMS 0 0.0  LMs w/ Microarousals 0 0.0   LM LMs Index  w/ Microarousal 0 0.0  w/ Awakening 0 0.0  w/ Resp Event 0 0.0  Spontaneous 0 0.0  Total 0 0.0     Desaturation threshold setting: 3% Minimum desaturation setting: 10 seconds SaO2 nadir: 75% The longest event was a 52 sec obstructive Apnea with a minimum SaO2 of 82%. The lowest SaO2 was 50% associated with a 28 sec obstructive Apnea. EKG Rates EKG Avg Max Min  Awake 78 111 64  Asleep 74 92 67  EKG Events:  Tachycardia

## 2023-12-02 NOTE — Addendum Note (Signed)
Addended by: Huston Foley on: 12/02/2023 04:49 PM   Modules accepted: Orders

## 2023-12-03 ENCOUNTER — Telehealth: Payer: Self-pay | Admitting: *Deleted

## 2023-12-03 NOTE — Telephone Encounter (Signed)
I called pt, spoke to [facility], his caregiver (ok per DPR) to give information.  I relayed that he has severe OSA on sleep study.  Request urgent set up.  Will send to [facility]  ([place] or [place] location).  Use 4 hours or more every night.  We see back for compliance appt 2-3 months and go ver sleep study and machine usage.  DME to call after authorizing with insurance.  She verbalized understanding. Made appt 03-03-2024 at 1245 with Sarah NP for initial cpap.

## 2023-12-03 NOTE — Telephone Encounter (Signed)
-----   Message from Huston Foley sent at 12/02/2023  4:49 PM EST ----- Urgent set up requested on PAP therapy, due to severe OSA. Patient referred by PCP, seen by me on 10/09/23, diagnostic PSG on 11/25/23.    Please call and notify the patient's caretaker/guardian that the recent sleep study showed severe obstructive sleep apnea. I recommend urgent treatment for in the form of autoPAP. We may consider at a CPAP titration study at a later date, if need be, which means, that we would ask him to come back in for a second sleep study with CPAP treatment. For now, I would like to start him on a so-called autoPAP machine at home, through a DME company (of his choice, or as per insurance requirement). The DME representative will educate him on how to use the machine, how to put the mask on, etc. I have placed an order in the chart. Please send referral, talk to patient, send report to referring MD. We will need a FU in sleep clinic for 10 weeks post-PAP set up, please arrange that with me or one of our NPs. Thanks,   Huston Foley, MD, PhD [facility])

## 2023-12-08 ENCOUNTER — Telehealth: Payer: Self-pay | Admitting: *Deleted

## 2023-12-08 NOTE — Telephone Encounter (Signed)
I sent Community message to [employer].  Urgent Set up  for CPAP.

## 2023-12-09 NOTE — Telephone Encounter (Signed)
New, Doristine Mango, RN; Natale Lay Received, thank you!     Previous Messages    ----- Message ----- From: Guy Begin, RN Sent: 12/08/2023   2:34 PM EST To: Alain Honey; Rojelio Brenner; * Subject: new cpap  SEVERE OSA                          New order in epic  Belvin Neels Male, 57 y.o., March 07, 1966 MRN: [id number]   I thought I sent to you previously.  URGENT SET UP.  [place]

## 2023-12-25 NOTE — Telephone Encounter (Signed)
 Resent order by community message to [facility].

## 2023-12-25 NOTE — Telephone Encounter (Signed)
 [name] care taker reports that she has reached out to DME, she has been told the order has not been received.  Please resend for pt.

## 2023-12-29 NOTE — Telephone Encounter (Signed)
 New, Adine Neysa Nena GORMAN, RN; Joylene Carlean Sheree Leveda Jackson Easter Delsa Eleanor Metta Morning Nena,  The order was recieved and is in process. We found and issue with our faxed line for this date. The order has been re-sub and the issue has been corrected.  Thank you,  Brad New       Previous Messages    ----- Message ----- From: Neysa Nena GORMAN, RN Sent: 12/25/2023   5:22 PM EST To: Adine Randolm Avelina Jackson; Melissa Ziegler; * Subject: new urgent set up                              Hi just received message that pt called and order is not there.  Can you follow up.  Thanks  SY  New, Adine Neysa Nena GORMAN, RN; Joylene Bradley; Cain, Mitchell; Ziegler, Melissa; Garcia, Patricia Received, thank you!     Previous Messages     ----- Message ----- From: Neysa Nena GORMAN, RN Sent: 12/08/2023   2:34 PM EST To: Adine Randolm Avelina Jackson; Eleanor Delsa; * Subject: new cpap  SEVERE OSA                           New order in epic   Lennis Korb Male, 58 y.o., 1966/06/26 MRN: [id number]   I thought I sent to you previously.   URGENT SET UP.   [place]

## 2024-01-14 ENCOUNTER — Encounter: Payer: Self-pay | Admitting: *Deleted

## 2024-01-20 ENCOUNTER — Telehealth: Payer: Self-pay | Admitting: Neurology

## 2024-01-20 NOTE — Telephone Encounter (Signed)
Zachary Thornton called to verify patient's appointment and to change if possible. Informed her this appt is his Initial CPAP and if scheduled outside of 30-90 day window insurance will not cover the CPAP. She decided to keep the appt.

## 2024-01-22 ENCOUNTER — Telehealth: Payer: Self-pay | Admitting: Neurology

## 2024-01-22 NOTE — Telephone Encounter (Signed)
[name] care taker reports that pt is having a lot of events thru the night even with a recent change in mask.  Caretaker states there are 20 events thru the night.  The DME suggested they reach out to Dr regarding what would be suggested for pt.

## 2024-01-22 NOTE — Telephone Encounter (Signed)
Machine setup 01/06/24

## 2024-01-23 NOTE — Telephone Encounter (Signed)
For now, I recommend we continue with the current settings and review his data over a longer period of time such as 30 to 60 days.

## 2024-01-26 NOTE — Telephone Encounter (Signed)
I spoke with the patient's caregiver Sunny Schlein (on [place]) and discussed message from Dr Frances Furbish. Sunny Schlein is ok with this plan. She says the patient's machine is now saying that there is a leak in his tubing so he has an appt with [facility] on Wednesday and is not using it in the meantime (about 4 nights) and she wants [place] to make this note. She states the pt has tried different masks and is now using a nasal mask because it may make it more friendly for him to sleep on his side. She states at night he may get up along to use the restroom, etc and will take off the mask. She isn't sure if he is getting a good seal when he puts it back on plus he has history of brain injury so that may affect things as well. [name] She will continue to monitor and encourage compliance once the tubing gets fixed. She thanked me for the call back. She will touch base with [place] in about 2 weeks or so. His f/u appt will be on 3/12 with Maralyn Sago NP.

## 2024-03-02 ENCOUNTER — Encounter: Payer: Self-pay | Admitting: Anesthesiology

## 2024-03-02 NOTE — Progress Notes (Unsigned)
 Patient: Zachary Thornton Date of Birth: Mar 12, 1966  Reason for Visit: Follow up History from: Patient Primary Neurologist: Frances Furbish  ASSESSMENT AND PLAN 58 y.o. year old male    HISTORY OF PRESENT ILLNESS: Today 03/02/24 Saw Dr. Frances Furbish October 2024 reporting snoring and daytime sleepiness.  Had PSG 11/25/23 showing severe OSA total AHI 67.1/hour and O2 nadir of 75% during supine non-REM sleep).  Ordered urgent CPAP set up.  HISTORY  10/09/23 Dr. Frances Furbish: I saw your patient, Zachary Thornton, upon your kind request in my sleep clinic today for initial consultation of his sleep disorder, in particular, concern for underlying obstructive sleep apnea.  The patient is accompanied by his full-time caretaker, Sunny Schlein, today.  As you know, Zachary Thornton is a 58 year old male with an underlying complex medical history of traumatic brain injury, status post craniotomy, VP shunt placement, seizure disorder, anemia, hyperlipidemia, history of peptic ulcer, angioedema allergic rhinitis, vitamin D deficiency, and mildly overweight state, who reports very little of his history, history is almost exclusively provided by his caretaker who reports that he snores loudly and is sleepy during the day.  His Epworth sleepiness score is 18 out of 24, fatigue severity score is 61 out of 63.  She has not witnessed any apneas, she feels that he is restless at night as she hears them sometimes.  She does hear him snore outside of his room.  He goes to his bedroom typically between 9 and 9:30 PM and rise time is 7:20 AM on a typical morning.  He does not have nightly nocturia and he denies any recurrent headaches.  He does not typically complain much peripheral lesion.  He does not have a family history of sleep apnea as far as she knows.  He drinks quite a bit of caffeine in the form of sweet tea, up to 6 or 7 cups/day.  He does not drink any coffee and only occasional soda.  He is a non-smoker and does not drink alcohol.  She is a full-time  live-in caregiver for him.  He does not typically need assistance at night, if he has to go to the bathroom, he can mobilize himself.  He walks with a cane and they are looking into getting him a walker for gait stability.   He follows with Dr. Everlena Cooper at [facility] neurology.  I reviewed your office note from 06/02/2023.  REVIEW OF SYSTEMS: Out of a complete 14 system review of symptoms, the patient complains only of the following symptoms, and all other reviewed systems are negative.  See HPI  ALLERGIES: Allergies  Allergen Reactions   Nuedexta [Dextromethorphan-Quinidine] Anaphylaxis    Lips began to swell   Nsaids Other (See Comments)    Due to GI bleeding   Depakote [Divalproex Sodium]    Nasacort [Triamcinolone]     Too many nose bleeds on this   Trazodone     Urine retention     HOME MEDICATIONS: Outpatient Medications Prior to Visit  Medication Sig Dispense Refill   atorvastatin (LIPITOR) 20 MG tablet Take 20 mg by mouth daily.     carbamazepine (CARBATROL) 300 MG 12 hr capsule TAKE ONE CAPSULE IN THE MORNING AND TWO CAPSULES IN THE EVENING. 270 capsule 3   clotrimazole-betamethasone (LOTRISONE) cream Apply 1 Application topically as needed.     cyanocobalamin (VITAMIN B12) 1000 MCG tablet Take 1,000 mcg by mouth daily.     Docusate Calcium (STOOL SOFTENER PO) Take 2 tablets by mouth at bedtime.  EPIPEN 2-PAK 0.3 MG/0.3ML SOAJ injection Inject 0.3 mg into the muscle as needed.     escitalopram (LEXAPRO) 20 MG tablet 20 mg daily.     ferrous sulfate 325 (65 FE) MG tablet Take 1 tablet (325 mg total) by mouth daily with breakfast. 30 tablet 3   lamoTRIgine (LAMICTAL) 200 MG tablet Take 1 tablet (200 mg total) by mouth 2 (two) times daily. 180 tablet 3   lamoTRIgine (LAMICTAL) 25 MG tablet TAKE TWO TABLETS BY MOUTH TWICE DAILY ALONG WITH 200MG  TABLET. 360 tablet 3   Multiple Vitamins-Minerals (MULTIVITAMIN MEN 50+ PO) Take by mouth 2 (two) times daily.     pantoprazole  (PROTONIX) 40 MG tablet TAKE 1 TABLET TWICE DAILY. (Patient taking differently: Take 40 mg by mouth 2 (two) times daily.) 60 tablet 0   tamsulosin (FLOMAX) 0.4 MG CAPS capsule Take 1 capsule (0.4 mg total) by mouth daily after breakfast. 30 capsule 0   VITAMIN D PO Take by mouth.     zinc sulfate 220 (50 Zn) MG capsule Take 220 mg by mouth daily.     No facility-administered medications prior to visit.    PAST MEDICAL HISTORY: Past Medical History:  Diagnosis Date   Angioedema    from Nuedexta, keeps epi pen around   Back injury    Blood loss anemia    Blood transfusion without reported diagnosis    Dementia with behavioral disturbance (HCC)    Depression    Duodenal ulcer hemorrhage    GI bleed    Headache    Hyperlipidemia    10/16   Seizures (HCC)    TBI (traumatic brain injury) (HCC)    Urine retention    foley after 10/16 admit   Vitamin D insufficiency 09/2015   level 29    PAST SURGICAL HISTORY: Past Surgical History:  Procedure Laterality Date   BRAIN SURGERY     CRANIOTOMY     Post TBI craniotomy and plate insertion   CSF SHUNT     ESOPHAGOGASTRODUODENOSCOPY N/A 11/01/2015   Procedure: ESOPHAGOGASTRODUODENOSCOPY (EGD);  Surgeon: Ruffin Frederick, MD;  Location: [facility];  Service: Gastroenterology;  Laterality: N/A;   ESOPHAGOGASTRODUODENOSCOPY (EGD) WITH PROPOFOL N/A 10/30/2015   Procedure: ESOPHAGOGASTRODUODENOSCOPY (EGD) WITH PROPOFOL;  Surgeon: Rachael Fee, MD;  Location: [facility];  Service: Endoscopy;  Laterality: N/A;   peg removed  07/2012    FAMILY HISTORY: Family History  Problem Relation Age of Onset   Hypertension Mother    Lung disease Mother    Heart disease Father        MVR   Heart attack Father    Stroke Maternal Aunt    Colitis Neg Hx    Esophageal cancer Neg Hx    Rectal cancer Neg Hx    Stomach cancer Neg Hx    Sleep apnea Neg Hx        not that he knows of    SOCIAL HISTORY: Social History   Socioeconomic  History   Marital status: Married    Spouse name: Not on file   Number of children: 1   Years of education: Not on file   Highest education level: Not on file  Occupational History   Occupation: Disabled  Tobacco Use   Smoking status: Never   Smokeless tobacco: Never  Vaping Use   Vaping status: Never Used  Substance and Sexual Activity   Alcohol use: No    Alcohol/week: 0.0 standard drinks of alcohol   Drug  use: No   Sexual activity: Yes  Other Topics Concern   Not on file  Social History Narrative   Mother lives next door. He has a live-in caregiver, Sunny Schlein.  Normally ambulates without assistance. He has a cane and will be getting a walker soon for balance.    Right handed   One story home   Social Drivers of Health   Financial Resource Strain: Not on file  Food Insecurity: Not on file  Transportation Needs: Not on file  Physical Activity: Not on file  Stress: Not on file  Social Connections: Not on file  Intimate Partner Violence: Not on file    PHYSICAL EXAM  There were no vitals filed for this visit. There is no height or weight on file to calculate BMI.  Generalized: Well developed, in no acute distress  Neurological examination  Mentation: Alert oriented to time, place, history taking. Follows all commands speech and language fluent Cranial nerve II-XII: Pupils were equal round reactive to light. Extraocular movements were full, visual field were full on confrontational test. Facial sensation and strength were normal. Uvula tongue midline. Head turning and shoulder shrug  were normal and symmetric. Motor: The motor testing reveals 5 over 5 strength of all 4 extremities. Good symmetric motor tone is noted throughout.  Sensory: Sensory testing is intact to soft touch on all 4 extremities. No evidence of extinction is noted.  Coordination: Cerebellar testing reveals good finger-nose-finger and heel-to-shin bilaterally.  Gait and station: Gait is normal. Tandem  gait is normal. Romberg is negative. No drift is seen.  Reflexes: Deep tendon reflexes are symmetric and normal bilaterally.   DIAGNOSTIC DATA (LABS, IMAGING, TESTING) - I reviewed patient records, labs, notes, testing and imaging myself where available.  Lab Results  Component Value Date   WBC 5.7 04/28/2023   HGB 13.7 04/28/2023   HCT 40.5 04/28/2023   MCV 93.8 04/28/2023   PLT 245 04/28/2023      Component Value Date/Time   NA 131 (L) 04/28/2023 0925   K 4.0 04/28/2023 0925   CL 99 04/28/2023 0925   CO2 24 04/28/2023 0925   GLUCOSE 116 (H) 04/28/2023 0925   BUN 11 04/28/2023 0925   CREATININE 0.76 04/28/2023 0925   CREATININE 0.79 10/08/2018 1359   CALCIUM 9.1 04/28/2023 0925   PROT 6.9 04/28/2023 0925   ALBUMIN 4.3 04/28/2023 0925   AST 26 04/28/2023 0925   ALT 20 04/28/2023 0925   ALKPHOS 90 04/28/2023 0925   BILITOT 0.6 04/28/2023 0925   GFRNONAA >60 04/28/2023 0925   GFRAA >60 12/10/2017 1635   No results found for: "CHOL", "HDL", "LDLCALC", "LDLDIRECT", "TRIG", "CHOLHDL" Lab Results  Component Value Date   HGBA1C 6.1 (H) 11/01/2015   Lab Results  Component Value Date   VITAMINB12 260 03/15/2021   Lab Results  Component Value Date   TSH 1.629 09/07/2012    Margie Ege, AGNP-C, DNP 03/02/2024, 3:23 PM [facility] [address], [address] [place], [place] [address] [phone]

## 2024-03-03 ENCOUNTER — Encounter: Payer: Self-pay | Admitting: Neurology

## 2024-03-03 ENCOUNTER — Ambulatory Visit (INDEPENDENT_AMBULATORY_CARE_PROVIDER_SITE_OTHER): Payer: Medicare Other | Admitting: Neurology

## 2024-03-03 VITALS — BP 131/83 | HR 92 | Ht 68.0 in | Wt 158.6 lb

## 2024-03-03 DIAGNOSIS — G4733 Obstructive sleep apnea (adult) (pediatric): Secondary | ICD-10-CM

## 2024-03-03 HISTORY — DX: Obstructive sleep apnea (adult) (pediatric): G47.33

## 2024-03-03 NOTE — Patient Instructions (Signed)
 Recommend working on continuing to use CPAP nightly, try to use more than 4 hours is the goal, discuss with urology about nocturia. Need to get 70% compliance for insurance purposes. Follow up in 6 months

## 2024-03-31 DIAGNOSIS — R3912 Poor urinary stream: Secondary | ICD-10-CM | POA: Diagnosis not present

## 2024-03-31 DIAGNOSIS — N401 Enlarged prostate with lower urinary tract symptoms: Secondary | ICD-10-CM | POA: Diagnosis not present

## 2024-03-31 DIAGNOSIS — R350 Frequency of micturition: Secondary | ICD-10-CM | POA: Diagnosis not present

## 2024-03-31 DIAGNOSIS — R351 Nocturia: Secondary | ICD-10-CM | POA: Diagnosis not present

## 2024-03-31 DIAGNOSIS — R3914 Feeling of incomplete bladder emptying: Secondary | ICD-10-CM | POA: Diagnosis not present

## 2024-05-06 ENCOUNTER — Telehealth: Payer: Self-pay | Admitting: Neurology

## 2024-05-06 NOTE — Telephone Encounter (Signed)
 [name] live in care taker is asking for a call to discuss what if anything at this point can be done for pt to become compliant re: CPAP usage, Care taker reports pt now has correct mask, please call her to discuss, she states pt is getting benefit from CPAP usage.

## 2024-05-06 NOTE — Telephone Encounter (Signed)
 If this is medicare's requirement then he will have to have an office visit then I can reorder HST and go through the process again.

## 2024-05-06 NOTE — Telephone Encounter (Signed)
 Returned call to felicia who stated that she received a box from [employer] stating they needed the pt to return the box due to non compliance. I have attached the resmed down load:  Called D[employer] with Marlis Simper on the line (3way call) [phone] Spoke to representative savannah and they stated there was a period of noncompliance and medicare would require them to return the machine. To get restarted they would have to have a new appt w/provider, new hst, and then order the machine and the pt have to be compliant for another 90 days. Pt caregiver felecia is concerned that medicare will not agree to pay for this again. Routing to sarah slack for review

## 2024-06-29 DIAGNOSIS — R350 Frequency of micturition: Secondary | ICD-10-CM | POA: Diagnosis not present

## 2024-06-29 DIAGNOSIS — N401 Enlarged prostate with lower urinary tract symptoms: Secondary | ICD-10-CM | POA: Diagnosis not present

## 2024-08-01 ENCOUNTER — Other Ambulatory Visit: Payer: Self-pay | Admitting: Neurology

## 2024-08-05 ENCOUNTER — Other Ambulatory Visit: Payer: Self-pay | Admitting: Neurology

## 2024-08-30 DIAGNOSIS — E785 Hyperlipidemia, unspecified: Secondary | ICD-10-CM | POA: Diagnosis not present

## 2024-08-30 DIAGNOSIS — Z125 Encounter for screening for malignant neoplasm of prostate: Secondary | ICD-10-CM | POA: Diagnosis not present

## 2024-08-30 DIAGNOSIS — Z1212 Encounter for screening for malignant neoplasm of rectum: Secondary | ICD-10-CM | POA: Diagnosis not present

## 2024-08-30 DIAGNOSIS — Z1389 Encounter for screening for other disorder: Secondary | ICD-10-CM | POA: Diagnosis not present

## 2024-08-30 DIAGNOSIS — D649 Anemia, unspecified: Secondary | ICD-10-CM | POA: Diagnosis not present

## 2024-08-30 DIAGNOSIS — E559 Vitamin D deficiency, unspecified: Secondary | ICD-10-CM | POA: Diagnosis not present

## 2024-08-30 DIAGNOSIS — E7849 Other hyperlipidemia: Secondary | ICD-10-CM | POA: Diagnosis not present

## 2024-08-30 NOTE — Progress Notes (Unsigned)
 NEUROLOGY FOLLOW UP OFFICE NOTE  Zachary Thornton [id number]  Assessment/Plan:   1  Symptomatic focal onset seizures with impaired consciousness as late effect of traumatic brain injury with breakthrough seizures x2.  No provoking factor known. 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   Subjective:  Zachary Thornton is a 58 year old man with traumatic brain injury and history of GI bleed who follows up for seizure disorder.  He is accompanied by his family friend/caregiver who supplement history.    UPDATE: Current medications: Lamotrigine  250 mg twice daily, carbamazepine  XR 300 mg in a.m. and 600 mg in p.m (600mg  twice daily caused hyponatremia).   Last seizure:  03/31/2022  He has been diagnosed with OSA and currently on CPAP. ***    HISTORY: He had a traumatic brain injury in 2012 after falling and hitting the back of his head on concrete.  He fractured his skull and sustained intracranial bleeding, requiring right craniectomy and VP shunt. [name]  He was in a coma for 6 weeks.  After 2 month hospitalization, he was discharged to inpatient rehab facility where he had to relearn how to walk, talk and feed himself.  His wife was unable to care for him and he subsequently moved in with his mother, who is his guardian.  His wife and son live in [place].   He was hospitalized in December for GI bleed due to duodenal ulcer, requiring multiple blood transfusions.   He presented to the ED on 04/29/16 following an unwitnessed fall in which he hit his head.  He did not recall the event.  His mother heard him fall in the bathroom and found him on the floor with eyes rolled back, unresponsive with flexed posturing and shaking of both upper extremities, lasting about 2 minutes.  He had bowel and bladder  incontinence.  He was slightly confused afterwards for about 10 minutes.  He did not exhibit foaming at the mouth or tongue biting.  CT of head was personally reviewed and showed chronic changes of traumatic brain injury with bifrontal and temporal encephalomalacia, as well as post surgical changes of right craniectomy with cranioplasty and left frontal approach VP shunt.  No evidence of hydrocephalus seen.  He was started on Keppra .  He has not had any recurrent spells.  He was found to have worsening anemia and received further transfusions by his PCP.  He does not drive.     He was admitted to [facility] from 03/23/17 to 03/24/17 for syncope.  He had a brief syncopal episode at church.  His face became pale.  He did not exhibit seizure-like activity.  It was brief.  He was found to be hypotensive by EMS with a BP of 60/30 and was given IV fluids, which improved blood pressure.  He was not orthostatic in the hospital.  Work up in the hospital included head CT with no acute findings, EEG with right hemispheric slowing (which could be due to underlying physiologic abnormality from encephalomalacia and surgery), but no epileptiform discharges, and unremarkable EKG and troponins.  My suspicion is that this was not a seizure.   He had a seizure on 12/10/17, described as staring spell of unresponsiveness followed by generalized tonic-clonic activity with urinary incontinence.  ED note mentions he was not taking  Keppra , but his caregiver disputes this.  Carbamazepine  level was 12.6.  Na was 131, K 4 and glucose 118.  UA was negative for infection.  He was loaded with Keppra  and discharged on Keppra  1000mg  twice daily.  Since then, he has had increased irritability and verbally abusive, so Keppra  was discontinued.   In 2022, he has had increased falls and change in behavior.  CT head performed on 07/23/2021 following a fall was personally reviewed and revealed no acute abnormalities.  He needs more  supervision.  He cannot blow dry his hair as he had the blow dryer to close to his scalp causing his hair to burn.  He also can no longer clean his ears himself as he once pushed the cerumen far into his canal with the Q-tip, requiring him to see his PCP to have it disimpacted. Once his toilet clogged. Instead of telling somebody, he took the used toilet paper from the toilet and threw them out of the window.  No agitation.  He has some sores on his scalp, presumably due to repeated scratching of his head.    On 03/31/2022, he was sitting at the table when he suddenly stiffened up and became unresponsive.  He exhibited shaking activity for a couple of minutes.  No tongue biting, incontinence or postictal confusion.   Lamotrigine  level was 6.0.  Carbamazepine  level was 8.2.  CT head on 04/08/2022 personally reviewed revealed extensive posttraumatic encephalomalacia but o acute findings.  Lamotrigine  was increased to 250mg  BID.  No subsequent seizures.  Mood is improved as well.  He went to PT for balance which has not improved.  He would squint his left eye when he reads.  He saw ophthalmology and had a prism  added to glasses but he still squints when he reads.    Due to worsening balance an MRI of the brain and IAC with and without contrast was ordered in 2024, but would not perform due to not knowing his VP shunt information.  He instead had a CT of the head on 05/15/2023, which was personally reviewed and revealed normal shunted ventricular size and stable extensive encephalomalacia in the bilateral frontotemporal convexities and bilateral cerebellar hemispheres but no acute findings.  He saw neurosurgery who verified shunt was stable.  He had an MRI that was reportedly okay (report/imaging not available).  Labs from March demonstrated carbamazepine  level of 8.4 and lamotrigine  level of 6.9.  He did see ENT who noted contusion and blood clots/cerumen in the left ear canal which was debrided.  He did have  sensorineural hearing loss and now has hearing aids.  He now uses a walker at the day center as they were concerned about his balance.  Otherwise, uses a cane.  No falls.     Past medication:  Keppra  (irritability)   He has no history of seizures prior to 2017.  PAST MEDICAL HISTORY: Past Medical History:  Diagnosis Date   Angioedema    from Nuedexta, keeps epi pen around   Back injury    Blood loss anemia    Blood transfusion without reported diagnosis    Dementia with behavioral disturbance (HCC)    Depression    Duodenal ulcer hemorrhage    GI bleed    Headache    Hyperlipidemia    10/16   OSA on CPAP 03/03/2024   Seizures (HCC)    TBI (traumatic brain injury) (HCC)    Urine retention    foley after 10/16 admit   Vitamin  D insufficiency 09/2015   level 29    MEDICATIONS: Current Outpatient Medications on File Prior to Visit  Medication Sig Dispense Refill   atorvastatin  (LIPITOR) 20 MG tablet Take 20 mg by mouth daily.     carbamazepine  (CARBATROL ) 300 MG 12 hr capsule TAKE ONE CAPSULE IN THE MORNING AND TWO CAPSULES IN THE EVENING. 270 capsule 3   clotrimazole-betamethasone (LOTRISONE) cream Apply 1 Application topically as needed.     cyanocobalamin  (VITAMIN B12) 1000 MCG tablet Take 1,000 mcg by mouth daily.     Docusate Calcium  (STOOL SOFTENER PO) Take 2 tablets by mouth at bedtime.     EPIPEN  2-PAK 0.3 MG/0.3ML SOAJ injection Inject 0.3 mg into the muscle as needed. (Patient not taking: Reported on 03/03/2024)     escitalopram  (LEXAPRO ) 20 MG tablet TAKE 1 tablet Orally Once a day 90 tablet 0   ferrous sulfate  325 (65 FE) MG tablet Take 1 tablet (325 mg total) by mouth daily with breakfast. 30 tablet 3   lamoTRIgine  (LAMICTAL ) 200 MG tablet Take 1 tablet (200 mg total) by mouth 2 (two) times daily. 180 tablet 3   lamoTRIgine  (LAMICTAL ) 25 MG tablet TAKE TWO TABLETS BY MOUTH TWICE DAILY ALONG WITH 200MG  TABLET. 120 tablet 0   Multiple Vitamins-Minerals (MULTIVITAMIN  MEN 50+ PO) Take by mouth 2 (two) times daily.     pantoprazole  (PROTONIX ) 40 MG tablet TAKE 1 TABLET TWICE DAILY. (Patient taking differently: Take 40 mg by mouth 2 (two) times daily.) 60 tablet 0   tamsulosin  (FLOMAX ) 0.4 MG CAPS capsule Take 1 capsule (0.4 mg total) by mouth daily after breakfast. 30 capsule 0   VITAMIN D  PO Take by mouth.     zinc sulfate 220 (50 Zn) MG capsule Take 220 mg by mouth daily.     No current facility-administered medications on file prior to visit.    ALLERGIES: Allergies  Allergen Reactions   Nuedexta [Dextromethorphan-Quinidine] Anaphylaxis    Lips began to swell   Nsaids Other (See Comments)    Due to GI bleeding   Depakote [Divalproex Sodium]    Nasacort [Triamcinolone]     Too many nose bleeds on this   Trazodone     Urine retention     FAMILY HISTORY: Family History  Problem Relation Age of Onset   Hypertension Mother    Lung disease Mother    Heart disease Father        MVR   Heart attack Father    Stroke Maternal Aunt    Colitis Neg Hx    Esophageal cancer Neg Hx    Rectal cancer Neg Hx    Stomach cancer Neg Hx    Sleep apnea Neg Hx        not that he knows of      Objective:  *** General: No acute distress.  Patient appears well-groomed.   Head:  Normocephalic/atraumatic Eyes:  Fundi examined but not visualized Heart:  Regular rate and rhythm Neurological Exam: alert and oriented.  Speech fluent and not dysarthric, language intact.  End-gaze horizontal nystagmus with difficulty tracking.  Otherwise, CN II-XII intact. Muscle strength 5/5 throughout.  Sensation to light touch intact.  Deep tendon reflexes 2+ throughout, toes downgoing.  Finger to nose testing with mild dysmetria bilaterally.  Wide-based spastic gait.  Romberg with sway.    Juliene Dunnings, DO  CC: Oneil Neth, MD

## 2024-08-31 ENCOUNTER — Encounter: Payer: Self-pay | Admitting: Neurology

## 2024-08-31 ENCOUNTER — Ambulatory Visit (INDEPENDENT_AMBULATORY_CARE_PROVIDER_SITE_OTHER): Payer: Medicare Other | Admitting: Neurology

## 2024-08-31 VITALS — BP 115/71 | HR 98 | Ht 67.0 in | Wt 173.0 lb

## 2024-08-31 DIAGNOSIS — G40109 Localization-related (focal) (partial) symptomatic epilepsy and epileptic syndromes with simple partial seizures, not intractable, without status epilepticus: Secondary | ICD-10-CM | POA: Diagnosis not present

## 2024-08-31 NOTE — Patient Instructions (Signed)
 Lamotrigine  250mg  twice daily Carbamazepine  XR 300mg  in morning and 600mg  at night

## 2024-09-06 DIAGNOSIS — F419 Anxiety disorder, unspecified: Secondary | ICD-10-CM | POA: Diagnosis not present

## 2024-09-06 DIAGNOSIS — K279 Peptic ulcer, site unspecified, unspecified as acute or chronic, without hemorrhage or perforation: Secondary | ICD-10-CM | POA: Diagnosis not present

## 2024-09-06 DIAGNOSIS — Z23 Encounter for immunization: Secondary | ICD-10-CM | POA: Diagnosis not present

## 2024-09-06 DIAGNOSIS — R569 Unspecified convulsions: Secondary | ICD-10-CM | POA: Diagnosis not present

## 2024-09-06 DIAGNOSIS — R338 Other retention of urine: Secondary | ICD-10-CM | POA: Diagnosis not present

## 2024-09-06 DIAGNOSIS — Z982 Presence of cerebrospinal fluid drainage device: Secondary | ICD-10-CM | POA: Diagnosis not present

## 2024-09-06 DIAGNOSIS — Z1331 Encounter for screening for depression: Secondary | ICD-10-CM | POA: Diagnosis not present

## 2024-09-06 DIAGNOSIS — H9193 Unspecified hearing loss, bilateral: Secondary | ICD-10-CM | POA: Diagnosis not present

## 2024-09-06 DIAGNOSIS — J309 Allergic rhinitis, unspecified: Secondary | ICD-10-CM | POA: Diagnosis not present

## 2024-09-06 DIAGNOSIS — R82998 Other abnormal findings in urine: Secondary | ICD-10-CM | POA: Diagnosis not present

## 2024-09-06 DIAGNOSIS — E785 Hyperlipidemia, unspecified: Secondary | ICD-10-CM | POA: Diagnosis not present

## 2024-09-06 DIAGNOSIS — Z0000 Encounter for general adult medical examination without abnormal findings: Secondary | ICD-10-CM | POA: Diagnosis not present

## 2024-09-06 DIAGNOSIS — B356 Tinea cruris: Secondary | ICD-10-CM | POA: Diagnosis not present

## 2024-09-08 ENCOUNTER — Other Ambulatory Visit: Payer: Self-pay | Admitting: Neurology

## 2024-09-08 ENCOUNTER — Telehealth: Admitting: Neurology

## 2024-09-08 NOTE — Progress Notes (Deleted)
 Patient: Zachary Thornton Date of Birth: 1966/10/18  Reason for Visit: Follow up for CPAP History from: Patient, caregiver Bobbette  Primary Neurologist: Buck  ASSESSMENT AND PLAN 58 y.o. year old male   1.  Severe OSA on CPAP  Encouraged to continue to work on nightly usage for minimum 4 hours.  He has tried several different masks at this point.  He is going to discuss nocturia with his primary care urologist. Unfortunately due to his cognitive impairment, he has trouble getting the mask back on at night after using the bathroom. Current AHI 6.2, significantly improved from AHI 67 at [facility].  We will continue current settings.  We will follow-up in 6 months for a video visit or sooner if needed.   HISTORY OF PRESENT ILLNESS: Today 09/08/24   03/03/24 SS: Saw Dr. Buck October 2024 reporting snoring and daytime sleepiness.  Had [facility] 11/25/23 showing severe OSA total AHI 67.1/hour and O2 nadir of 75% during supine non-REM sleep).  Ordered urgent CPAP set up. Setup date 01/06/24. CPAP download > 4 hours 53%, AHI 6.2, leak 41.0.   Here with his caregiver, Bobbette. > 4 hour usage is poor because when he gets up to use the bathroom he doesn't get it put back on. Has tried 3 different masks, still has trouble getting the mask back on. Due to cognitive issues, cannot get put back on properly. Using FFM mask now. Still has daytime fatigue, sleepiness. He goes to a day program and is still sleeping. He feels his sleep is more restorative. He gets up several times at night to use the bathroom. When he goes, he is in there for about 20 minutes, takes Flomax . He is in good spirits. Very agreeable.   HISTORY  10/09/23 Dr. Buck: I saw your patient, Zachary Thornton, upon your kind request in my sleep clinic today for initial consultation of his sleep disorder, in particular, concern for underlying obstructive sleep apnea.  The patient is accompanied by his full-time caretaker, Bobbette, today.  As you know, Mr. Zachary Thornton  is a 58 year old male with an underlying complex medical history of traumatic brain injury, status post craniotomy, VP shunt placement, seizure disorder, anemia, hyperlipidemia, history of peptic ulcer, angioedema allergic rhinitis, vitamin D  deficiency, and mildly overweight state, who reports very little of his history, history is almost exclusively provided by his caretaker who reports that he snores loudly and is sleepy during the day.  His Epworth sleepiness score is 18 out of 24, fatigue severity score is 61 out of 63.  She has not witnessed any apneas, she feels that he is restless at night as she hears them sometimes.  She does hear him snore outside of his room.  He goes to his bedroom typically between 9 and 9:30 PM and rise time is 7:20 AM on a typical morning.  He does not have nightly nocturia and he denies any recurrent headaches.  He does not typically complain much peripheral lesion.  He does not have a family history of sleep apnea as far as she knows.  He drinks quite a bit of caffeine in the form of sweet tea, up to 6 or 7 cups/day.  He does not drink any coffee and only occasional soda.  He is a non-smoker and does not drink alcohol.  She is a full-time live-in caregiver for him.  He does not typically need assistance at night, if he has to go to the bathroom, he can mobilize himself.  He walks  with a cane and they are looking into getting him a walker for gait stability.   He follows with Dr. Skeet at [facility] neurology.  I reviewed your office note from 06/02/2023.  REVIEW OF SYSTEMS: Out of a complete 14 system review of symptoms, the patient complains only of the following symptoms, and all other reviewed systems are negative.  See HPI  ALLERGIES: Allergies  Allergen Reactions   Nuedexta [Dextromethorphan-Quinidine] Anaphylaxis    Lips began to swell   Nsaids Other (See Comments)    Due to GI bleeding   Depakote [Divalproex Sodium]    Nasacort [Triamcinolone]     Too many nose  bleeds on this   Trazodone     Urine retention     HOME MEDICATIONS: Outpatient Medications Prior to Visit  Medication Sig Dispense Refill   atorvastatin  (LIPITOR) 20 MG tablet Take 20 mg by mouth daily.     carbamazepine  (CARBATROL ) 300 MG 12 hr capsule TAKE ONE CAPSULE IN THE MORNING AND TWO CAPSULES IN THE EVENING. 270 capsule 3   clotrimazole-betamethasone (LOTRISONE) cream Apply 1 Application topically as needed.     cyanocobalamin  (VITAMIN B12) 1000 MCG tablet Take 1,000 mcg by mouth daily.     Docusate Calcium  (STOOL SOFTENER PO) Take 2 tablets by mouth at bedtime.     EPIPEN  2-PAK 0.3 MG/0.3ML SOAJ injection Inject 0.3 mg into the muscle as needed.     escitalopram  (LEXAPRO ) 20 MG tablet TAKE 1 tablet Orally Once a day 90 tablet 0   ferrous sulfate  325 (65 FE) MG tablet Take 1 tablet (325 mg total) by mouth daily with breakfast. (Patient not taking: Reported on 08/31/2024) 30 tablet 3   lamoTRIgine  (LAMICTAL ) 200 MG tablet Take 1 tablet (200 mg total) by mouth 2 (two) times daily. 180 tablet 3   lamoTRIgine  (LAMICTAL ) 25 MG tablet TAKE TWO TABLETS BY MOUTH TWICE DAILY ALONG WITH 200MG  TABLET. 120 tablet 0   Multiple Vitamins-Minerals (MULTIVITAMIN MEN 50+ PO) Take by mouth 2 (two) times daily.     pantoprazole  (PROTONIX ) 40 MG tablet TAKE 1 TABLET TWICE DAILY. 60 tablet 0   tamsulosin  (FLOMAX ) 0.4 MG CAPS capsule Take 1 capsule (0.4 mg total) by mouth daily after breakfast. 30 capsule 0   VITAMIN D  PO Take by mouth.     zinc sulfate 220 (50 Zn) MG capsule Take 220 mg by mouth daily.     No facility-administered medications prior to visit.    PAST MEDICAL HISTORY: Past Medical History:  Diagnosis Date   Angioedema    from Nuedexta, keeps epi pen around   Back injury    Blood loss anemia    Blood transfusion without reported diagnosis    Dementia with behavioral disturbance (HCC)    Depression    Duodenal ulcer hemorrhage    GI bleed    Headache    Hyperlipidemia     10/16   OSA on CPAP 03/03/2024   Seizures (HCC)    TBI (traumatic brain injury) (HCC)    Urine retention    foley after 10/16 admit   Vitamin D  insufficiency 09/2015   level 29    PAST SURGICAL HISTORY: Past Surgical History:  Procedure Laterality Date   BRAIN SURGERY     CRANIOTOMY     Post TBI craniotomy and plate insertion   CSF SHUNT     ESOPHAGOGASTRODUODENOSCOPY N/A 11/01/2015   Procedure: ESOPHAGOGASTRODUODENOSCOPY (EGD);  Surgeon: Elspeth Deward Naval, MD;  Location: [facility];  Service: Gastroenterology;  Laterality: N/A;   ESOPHAGOGASTRODUODENOSCOPY (EGD) WITH PROPOFOL  N/A 10/30/2015   Procedure: ESOPHAGOGASTRODUODENOSCOPY (EGD) WITH PROPOFOL ;  Surgeon: Toribio SHAUNNA Cedar, MD;  Location: [facility];  Service: Endoscopy;  Laterality: N/A;   peg removed  07/2012    FAMILY HISTORY: Family History  Problem Relation Age of Onset   Hypertension Mother    Lung disease Mother    Heart disease Father        MVR   Heart attack Father    Stroke Maternal Aunt    Colitis Neg Hx    Esophageal cancer Neg Hx    Rectal cancer Neg Hx    Stomach cancer Neg Hx    Sleep apnea Neg Hx        not that he knows of    SOCIAL HISTORY: Social History   Socioeconomic History   Marital status: Married    Spouse name: Not on file   Number of children: 1   Years of education: Not on file   Highest education level: Not on file  Occupational History   Occupation: Disabled  Tobacco Use   Smoking status: Never   Smokeless tobacco: Never  Vaping Use   Vaping status: Never Used  Substance and Sexual Activity   Alcohol use: No    Alcohol/week: 0.0 standard drinks of alcohol   Drug use: No   Sexual activity: Yes  Other Topics Concern   Not on file  Social History Narrative   Mother lives next door. He has a live-in caregiver, Bobbette.  Normally ambulates without assistance. He has a cane and will be getting a walker soon for balance.    Right handed   One story home   Social  Drivers of Health   Financial Resource Strain: Not on file  Food Insecurity: Not on file  Transportation Needs: Not on file  Physical Activity: Not on file  Stress: Not on file  Social Connections: Not on file  Intimate Partner Violence: Not on file    PHYSICAL EXAM  There were no vitals filed for this visit.  There is no height or weight on file to calculate BMI.  Generalized: Well developed, in no acute distress  Neurological examination  Mentation: Alert oriented, patient is very agreeable, history is provided by caregiver  Cranial nerve II-XII: Pupils were equal round reactive to light.  Motor: Moves all extremities  Coordination: difficulty performing commands  Gait and station: Gait is wide based cautious   DIAGNOSTIC DATA (LABS, IMAGING, TESTING) - I reviewed patient records, labs, notes, testing and imaging myself where available.  Lab Results  Component Value Date   WBC 5.7 04/28/2023   HGB 13.7 04/28/2023   HCT 40.5 04/28/2023   MCV 93.8 04/28/2023   PLT 245 04/28/2023      Component Value Date/Time   NA 131 (L) 04/28/2023 0925   K 4.0 04/28/2023 0925   CL 99 04/28/2023 0925   CO2 24 04/28/2023 0925   GLUCOSE 116 (H) 04/28/2023 0925   BUN 11 04/28/2023 0925   CREATININE 0.76 04/28/2023 0925   CREATININE 0.79 10/08/2018 1359   CALCIUM  9.1 04/28/2023 0925   PROT 6.9 04/28/2023 0925   ALBUMIN 4.3 04/28/2023 0925   AST 26 04/28/2023 0925   ALT 20 04/28/2023 0925   ALKPHOS 90 04/28/2023 0925   BILITOT 0.6 04/28/2023 0925   GFRNONAA >60 04/28/2023 0925   GFRAA >60 12/10/2017 1635   No results found for: CHOL, HDL, LDLCALC, LDLDIRECT, TRIG, CHOLHDL Lab Results  Component Value Date   HGBA1C 6.1 (H) 11/01/2015   Lab Results  Component Value Date   VITAMINB12 260 03/15/2021   Lab Results  Component Value Date   TSH 1.629 09/07/2012    Lauraine Born, AGNP-C, DNP 09/08/2024, 5:40 AM [facility] [address] [place], [place] [address] [phone]

## 2024-09-28 ENCOUNTER — Encounter: Payer: Self-pay | Admitting: Neurology

## 2024-09-30 ENCOUNTER — Telehealth: Payer: Self-pay | Admitting: Neurology

## 2024-09-30 ENCOUNTER — Other Ambulatory Visit: Payer: Self-pay | Admitting: Neurology

## 2024-09-30 NOTE — Telephone Encounter (Signed)
 See my chart message

## 2024-09-30 NOTE — Telephone Encounter (Signed)
 Felecia is calling about the mychart message she sent to jaffe. States she has not heard from anyone

## 2024-10-01 ENCOUNTER — Other Ambulatory Visit: Payer: Self-pay | Admitting: Neurology

## 2024-10-22 ENCOUNTER — Other Ambulatory Visit: Payer: Self-pay | Admitting: Neurology

## 2024-10-28 ENCOUNTER — Encounter: Payer: Self-pay | Admitting: Neurology

## 2024-11-30 ENCOUNTER — Other Ambulatory Visit: Payer: Self-pay | Admitting: Neurology

## 2025-01-24 ENCOUNTER — Emergency Department
Admission: EM | Admit: 2025-01-24 | Discharge: 2025-01-25 | Disposition: A | Attending: Emergency Medicine | Admitting: Emergency Medicine

## 2025-01-24 DIAGNOSIS — R569 Unspecified convulsions: Secondary | ICD-10-CM

## 2025-01-24 DIAGNOSIS — G40909 Epilepsy, unspecified, not intractable, without status epilepticus: Secondary | ICD-10-CM | POA: Insufficient documentation

## 2025-01-24 LAB — BASIC METABOLIC PANEL WITH GFR
Anion gap: 13 (ref 5–15)
BUN: 15 mg/dL (ref 6–20)
CO2: 26 mmol/L (ref 22–32)
Calcium: 9.5 mg/dL (ref 8.9–10.3)
Chloride: 99 mmol/L (ref 98–111)
Creatinine, Ser: 1.05 mg/dL (ref 0.61–1.24)
GFR, Estimated: >60 mL/min
Glucose, Bld: 140 mg/dL — ABNORMAL HIGH (ref 70–99)
Potassium: 4.1 mmol/L (ref 3.5–5.1)
Sodium: 138 mmol/L (ref 135–145)

## 2025-01-24 LAB — CBC
HCT: 43.5 % (ref 39.0–52.0)
Hemoglobin: 14.4 g/dL (ref 13.0–17.0)
MCH: 31.8 pg (ref 26.0–34.0)
MCHC: 33.1 g/dL (ref 30.0–36.0)
MCV: 96 fL (ref 80.0–100.0)
Platelets: 258 10*3/uL (ref 150–400)
RBC: 4.53 MIL/uL (ref 4.22–5.81)
RDW: 11.6 % (ref 11.5–15.5)
WBC: 8.9 10*3/uL (ref 4.0–10.5)
nRBC: 0 % (ref 0.0–0.2)

## 2025-01-24 MED ORDER — CARBAMAZEPINE 200 MG PO TABS
600.0000 mg | ORAL_TABLET | Freq: Once | ORAL | Status: AC
  Administered 2025-01-24: 600 mg via ORAL
  Filled 2025-01-24: qty 3

## 2025-01-24 MED ORDER — LAMOTRIGINE 25 MG PO TABS
225.0000 mg | ORAL_TABLET | Freq: Once | ORAL | Status: AC
  Administered 2025-01-24: 225 mg via ORAL
  Filled 2025-01-24: qty 1

## 2025-01-25 LAB — URINE DRUG SCREEN
Amphetamines: NEGATIVE
Barbiturates: NEGATIVE
Benzodiazepines: NEGATIVE
Cocaine: NEGATIVE
Fentanyl: NEGATIVE
Methadone Scn, Ur: NEGATIVE
Opiates: NEGATIVE
Tetrahydrocannabinol: NEGATIVE

## 2025-01-25 LAB — URINALYSIS, ROUTINE W REFLEX MICROSCOPIC
Bilirubin Urine: NEGATIVE
Glucose, UA: NEGATIVE mg/dL
Hgb urine dipstick: NEGATIVE
Ketones, ur: NEGATIVE mg/dL
Leukocytes,Ua: NEGATIVE
Nitrite: NEGATIVE
Protein, ur: NEGATIVE mg/dL
Specific Gravity, Urine: 1.018 (ref 1.005–1.030)
pH: 6 (ref 5.0–8.0)

## 2025-01-25 LAB — ETHANOL: Alcohol, Ethyl (B): <15 mg/dL

## 2025-01-25 LAB — CARBAMAZEPINE LEVEL, TOTAL: Carbamazepine Lvl: 7.2 ug/mL (ref 4.0–12.0)

## 2025-01-25 NOTE — ED Provider Notes (Signed)
 1:40 AM  Assumed care at shift change.  Patient here after witnessed seizure activity by his caregiver at his group home.  Last seizure was 2 years ago.  Patient on Lamictal  and Tegretol .  Labs, urine are unremarkable today.  Patient is at his baseline currently.  Given this is his first breakthrough seizure in 2 years, I have recommended that we continue his medications as prescribed but recommended close follow-up with their neurologist.  Caregiver comfortable with this plan.  At this time, I do not feel there is any life-threatening condition present. I reviewed all nursing notes, vitals, pertinent previous records.  All lab and urine results, EKGs, imaging ordered have been independently reviewed and interpreted by myself.  I reviewed all available radiology reports from any imaging ordered this visit.  Based on my assessment, I feel the patient is safe to be discharged home without further emergent workup and can continue workup as an outpatient as needed. Discussed all findings, treatment plan as well as usual and customary return precautions.  They verbalize understanding and are comfortable with this plan.  Outpatient follow-up has been provided as needed.  All questions have been answered.    Zachary Thornton, Josette SAILOR, DO 01/25/25 0221

## 2025-01-25 NOTE — Discharge Instructions (Signed)
 Please follow-up with your neurologist.  Please continue your medications as prescribed.  Your lab work, urine today were reassuring.  We did send carbamazepine  and lamotrigine  levels that your neurologist can follow-up with.

## 2025-01-26 ENCOUNTER — Telehealth: Payer: Self-pay | Admitting: Neurology

## 2025-01-26 LAB — LAMOTRIGINE LEVEL: Lamotrigine Lvl: 4.1 ug/mL (ref 2.0–20.0)

## 2025-01-26 NOTE — Telephone Encounter (Signed)
 Scheduled

## 2025-01-26 NOTE — Telephone Encounter (Signed)
 Brad ( friend) called and she stated that Pt had a seizure on Monday and the EMT came out and Pt went to the Emergency room and the Dr. On call stated that Pt needed to see the Neurologist. Please call.

## 2025-01-26 NOTE — Telephone Encounter (Signed)
 Felisha lvm stating that Trev had a seizure on Monday evening, and had to take him to the ER. Dr. SHAUNNA wants him to come in and be seen by Dr. Skeet.   PH: [phone]

## 2025-01-26 NOTE — Progress Notes (Unsigned)
 "  NEUROLOGY FOLLOW UP OFFICE NOTE  Zachary Thornton [id number]  Assessment/Plan:   1  Symptomatic focal onset seizures with impaired consciousness as late effect of traumatic brain injury with breakthrough seizures x2.  No provoking factor known. 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   Subjective:  Zachary Thornton is a 59 year old man with traumatic brain injury, OSA and history of GI bleed who follows up for seizure disorder.  He is accompanied by his mother and sister who supplement history.    UPDATE: Current medications: Lamotrigine  250 mg twice daily, carbamazepine  XR 300 mg in a.m. and 600 mg in p.m (600mg  twice daily caused hyponatremia).   Last seizure:  01/24/2022  ***.  He did not miss any doses of his medication.  Seen in the ED at [facility]. He was afebrile and CBC and UA did not indicate infection.   BMP revealed no electrolyte imbalance with Na 138.  Urine drug screen and ethanol were negative. Carbamazepine  level was 7.2.  Lamotrigine  level in process.       HISTORY: He had a traumatic brain injury in 2012 after falling and hitting the back of his head on concrete.  He fractured his skull and sustained intracranial bleeding, requiring right craniectomy and VP shunt. [name]  He was in a coma for 6 weeks.  After 2 month hospitalization, he was discharged to inpatient rehab facility where he had to relearn how to walk, talk and feed himself.  His wife was unable to care for him and he subsequently moved in with his mother, who is his guardian.  His wife and son live in [place].   He was hospitalized in December for GI bleed due to duodenal ulcer, requiring multiple blood transfusions.   He presented to the ED on 04/29/16 following an unwitnessed fall in which he hit his head.  He did not  recall the event.  His mother heard him fall in the bathroom and found him on the floor with eyes rolled back, unresponsive with flexed posturing and shaking of both upper extremities, lasting about 2 minutes.  He had bowel and bladder incontinence.  He was slightly confused afterwards for about 10 minutes.  He did not exhibit foaming at the mouth or tongue biting.  CT of head was personally reviewed and showed chronic changes of traumatic brain injury with bifrontal and temporal encephalomalacia, as well as post surgical changes of right craniectomy with cranioplasty and left frontal approach VP shunt.  No evidence of hydrocephalus seen.  He was started on Keppra .  He has not had any recurrent spells.  He was found to have worsening anemia and received further transfusions by his PCP.  He does not drive.     He was admitted to [facility] from 03/23/17 to 03/24/17 for syncope.  He had a brief syncopal episode at church.  His face became pale.  He did not exhibit seizure-like activity.  It was brief.  He was found to be hypotensive by EMS with a BP of 60/30 and was given IV fluids, which improved blood pressure.  He was not orthostatic in the hospital.  Work up in the hospital included head CT with no acute findings, EEG with right hemispheric slowing (which could be due to underlying physiologic abnormality from encephalomalacia  and surgery), but no epileptiform discharges, and unremarkable EKG and troponins.  My suspicion is that this was not a seizure.   He had a seizure on 12/10/17, described as staring spell of unresponsiveness followed by generalized tonic-clonic activity with urinary incontinence.  ED note mentions he was not taking Keppra , but his caregiver disputes this.  Carbamazepine  level was 12.6.  Na was 131, K 4 and glucose 118.  UA was negative for infection.  He was loaded with Keppra  and discharged on Keppra  1000mg  twice daily.  Since then, he has had increased irritability and  verbally abusive, so Keppra  was discontinued.   In 2022, he has had increased falls and change in behavior.  CT head performed on 07/23/2021 following a fall was personally reviewed and revealed no acute abnormalities.  He needs more supervision.  He cannot blow dry his hair as he had the blow dryer to close to his scalp causing his hair to burn.  He also can no longer clean his ears himself as he once pushed the cerumen far into his canal with the Q-tip, requiring him to see his PCP to have it disimpacted. Once his toilet clogged. Instead of telling somebody, he took the used toilet paper from the toilet and threw them out of the window.  No agitation.  He has some sores on his scalp, presumably due to repeated scratching of his head.    On 03/31/2022, he was sitting at the table when he suddenly stiffened up and became unresponsive.  He exhibited shaking activity for a couple of minutes.  No tongue biting, incontinence or postictal confusion.   Lamotrigine  level was 6.0.  Carbamazepine  level was 8.2.  CT head on 04/08/2022 personally reviewed revealed extensive posttraumatic encephalomalacia but o acute findings.  Lamotrigine  was increased to 250mg  BID.  No subsequent seizures.  Mood is improved as well.  He went to PT for balance which has not improved.  He would squint his left eye when he reads.  He saw ophthalmology and had a prism  added to glasses but he still squints when he reads.    Due to worsening balance an MRI of the brain and IAC with and without contrast was ordered in 2024, but would not perform due to not knowing his VP shunt information.  He instead had a CT of the head on 05/15/2023, which was personally reviewed and revealed normal shunted ventricular size and stable extensive encephalomalacia in the bilateral frontotemporal convexities and bilateral cerebellar hemispheres but no acute findings.  He saw neurosurgery who verified shunt was stable.  He had an MRI that was reportedly okay  (report/imaging not available).  Labs from March demonstrated carbamazepine  level of 8.4 and lamotrigine  level of 6.9.  He did see ENT who noted contusion and blood clots/cerumen in the left ear canal which was debrided.  He did have sensorineural hearing loss and now has hearing aids.  He now uses a walker at the day center as they were concerned about his balance.  Otherwise, uses a cane.  No falls.     Past medication:  Keppra  (irritability)   He has no history of seizures prior to 2017.  PAST MEDICAL HISTORY: Past Medical History:  Diagnosis Date   Angioedema    from Nuedexta, keeps epi pen around   Back injury    Blood loss anemia    Blood transfusion without reported diagnosis    Dementia with behavioral disturbance (HCC)    Depression    Duodenal ulcer  hemorrhage    GI bleed    Headache    Hyperlipidemia    10/16   OSA on CPAP 03/03/2024   Seizures (HCC)    TBI (traumatic brain injury) (HCC)    Urine retention    foley after 10/16 admit   Vitamin D  insufficiency 09/2015   level 29    MEDICATIONS: Current Outpatient Medications on File Prior to Visit  Medication Sig Dispense Refill   atorvastatin  (LIPITOR) 20 MG tablet Take 20 mg by mouth daily.     carbamazepine  (CARBATROL ) 300 MG 12 hr capsule TAKE ONE CAPSULE IN THE MORNING AND TWO CAPSULES IN THE EVENING. 270 capsule 3   clotrimazole-betamethasone (LOTRISONE) cream Apply 1 Application topically as needed.     cyanocobalamin  (VITAMIN B12) 1000 MCG tablet Take 1,000 mcg by mouth daily.     Docusate Calcium  (STOOL SOFTENER PO) Take 2 tablets by mouth at bedtime.     EPIPEN  2-PAK 0.3 MG/0.3ML SOAJ injection Inject 0.3 mg into the muscle as needed.     escitalopram  (LEXAPRO ) 20 MG tablet TAKE 1 tablet Orally Once a day 90 tablet 0   ferrous sulfate  325 (65 FE) MG tablet Take 1 tablet (325 mg total) by mouth daily with breakfast. (Patient not taking: Reported on 08/31/2024) 30 tablet 3   lamoTRIgine  (LAMICTAL ) 200 MG tablet  Take 1 tablet (200 mg total) by mouth 2 (two) times daily. 180 tablet 3   lamoTRIgine  (LAMICTAL ) 25 MG tablet TAKE TWO TABLETS BY MOUTH TWICE DAILY ALONG WITH 200MG  TABLET. 360 tablet 3   Multiple Vitamins-Minerals (MULTIVITAMIN MEN 50+ PO) Take by mouth 2 (two) times daily.     pantoprazole  (PROTONIX ) 40 MG tablet TAKE 1 TABLET TWICE DAILY. 60 tablet 0   tamsulosin  (FLOMAX ) 0.4 MG CAPS capsule Take 1 capsule (0.4 mg total) by mouth daily after breakfast. 30 capsule 0   VITAMIN D  PO Take by mouth.     zinc sulfate 220 (50 Zn) MG capsule Take 220 mg by mouth daily.     No current facility-administered medications on file prior to visit.    ALLERGIES: Allergies  Allergen Reactions   Nuedexta [Dextromethorphan-Quinidine] Anaphylaxis    Lips began to swell   Nsaids Other (See Comments)    Due to GI bleeding   Depakote [Divalproex Sodium]    Nasacort [Triamcinolone]     Too many nose bleeds on this   Sulfa Antibiotics    Trazodone     Urine retention     FAMILY HISTORY: Family History  Problem Relation Age of Onset   Hypertension Mother    Lung disease Mother    Heart disease Father        MVR   Heart attack Father    Stroke Maternal Aunt    Colitis Neg Hx    Esophageal cancer Neg Hx    Rectal cancer Neg Hx    Stomach cancer Neg Hx    Sleep apnea Neg Hx        not that he knows of      Objective:  *** General: No acute distress.  Patient appears well-groomed.   Head:  Normocephalic/atraumatic Eyes:  Fundi examined but not visualized Heart:  Regular rate and rhythm Neurological Exam: alert and oriented.  Speech fluent and not dysarthric, language intact.  End-gaze horizontal nystagmus with difficulty tracking.  Otherwise, CN II-XII intact. Muscle strength 5/5 throughout.  Sensation to light touch intact.  Deep tendon reflexes 2+ throughout, toes downgoing.  Finger  to nose testing with mild dysmetria bilaterally.  Wide-based spastic gait.  Romberg with sway.    Juliene Dunnings, DO  CC: Oneil Neth, MD      "

## 2025-01-27 ENCOUNTER — Encounter: Payer: Self-pay | Admitting: Neurology

## 2025-01-27 ENCOUNTER — Ambulatory Visit: Admitting: Neurology

## 2025-01-27 VITALS — BP 116/69 | HR 102 | Ht 71.0 in | Wt 185.0 lb

## 2025-01-27 DIAGNOSIS — G40109 Localization-related (focal) (partial) symptomatic epilepsy and epileptic syndromes with simple partial seizures, not intractable, without status epilepticus: Secondary | ICD-10-CM

## 2025-01-27 DIAGNOSIS — Z79899 Other long term (current) drug therapy: Secondary | ICD-10-CM

## 2025-01-27 MED ORDER — LAMOTRIGINE 100 MG PO TABS: 300.0000 mg | ORAL_TABLET | Freq: Two times a day (BID) | ORAL | 5 refills | Status: AC

## 2025-01-27 NOTE — Patient Instructions (Signed)
 Increase lamotrigine  to 300mg  twice daily (take three 100mg  tablets twice daily) - caution for dizziness. Check trough lamotrigine  level in 2 weeks. Continue carbamazepine  XR 300mg  in morning and 600mg  at night. Follow up 4 months.

## 2025-06-01 ENCOUNTER — Ambulatory Visit: Payer: Self-pay | Admitting: Neurology

## 2025-08-31 ENCOUNTER — Ambulatory Visit: Admitting: Neurology
